# Patient Record
Sex: Female | Born: 1950
Health system: Southern US, Community
[De-identification: ages and names within clinical notes are randomized; demographics above are authoritative.]

## PROBLEM LIST (undated history)

## (undated) DIAGNOSIS — G8929 Other chronic pain: Secondary | ICD-10-CM

## (undated) DIAGNOSIS — K5909 Other constipation: Secondary | ICD-10-CM

## (undated) DIAGNOSIS — D649 Anemia, unspecified: Secondary | ICD-10-CM

## (undated) DIAGNOSIS — K279 Peptic ulcer, site unspecified, unspecified as acute or chronic, without hemorrhage or perforation: Secondary | ICD-10-CM

## (undated) DIAGNOSIS — E7800 Pure hypercholesterolemia, unspecified: Secondary | ICD-10-CM

## (undated) DIAGNOSIS — F329 Major depressive disorder, single episode, unspecified: Secondary | ICD-10-CM

## (undated) DIAGNOSIS — F32A Depression, unspecified: Secondary | ICD-10-CM

## (undated) DIAGNOSIS — Z9889 Other specified postprocedural states: Secondary | ICD-10-CM

## (undated) DIAGNOSIS — D490 Neoplasm of unspecified behavior of digestive system: Secondary | ICD-10-CM

## (undated) DIAGNOSIS — Z8719 Personal history of other diseases of the digestive system: Secondary | ICD-10-CM

## (undated) DIAGNOSIS — Z9289 Personal history of other medical treatment: Secondary | ICD-10-CM

## (undated) DIAGNOSIS — T39011A Poisoning by aspirin, accidental (unintentional), initial encounter: Secondary | ICD-10-CM

## (undated) DIAGNOSIS — K219 Gastro-esophageal reflux disease without esophagitis: Secondary | ICD-10-CM

## (undated) DIAGNOSIS — M545 Low back pain, unspecified: Secondary | ICD-10-CM

## (undated) DIAGNOSIS — G4710 Hypersomnia, unspecified: Secondary | ICD-10-CM

## (undated) DIAGNOSIS — F419 Anxiety disorder, unspecified: Secondary | ICD-10-CM

## (undated) DIAGNOSIS — R112 Nausea with vomiting, unspecified: Secondary | ICD-10-CM

## (undated) DIAGNOSIS — R270 Ataxia, unspecified: Secondary | ICD-10-CM

## (undated) DIAGNOSIS — G500 Trigeminal neuralgia: Secondary | ICD-10-CM

## (undated) DIAGNOSIS — K589 Irritable bowel syndrome without diarrhea: Secondary | ICD-10-CM

## (undated) DIAGNOSIS — E039 Hypothyroidism, unspecified: Secondary | ICD-10-CM

## (undated) DIAGNOSIS — M1990 Unspecified osteoarthritis, unspecified site: Secondary | ICD-10-CM

## (undated) DIAGNOSIS — G43909 Migraine, unspecified, not intractable, without status migrainosus: Secondary | ICD-10-CM

## (undated) HISTORY — DX: Irritable bowel syndrome, unspecified: K58.9

## (undated) HISTORY — PX: ANKLE FRACTURE SURGERY: SHX122

## (undated) HISTORY — DX: Peptic ulcer, site unspecified, unspecified as acute or chronic, without hemorrhage or perforation: K27.9

## (undated) HISTORY — DX: Ataxia, unspecified: R27.0

## (undated) HISTORY — DX: Gastro-esophageal reflux disease without esophagitis: K21.9

## (undated) HISTORY — PX: PAROTID GLAND TUMOR EXCISION: SHX5221

## (undated) HISTORY — DX: Neoplasm of unspecified behavior of digestive system: D49.0

## (undated) HISTORY — PX: HERNIA REPAIR: SHX51

## (undated) HISTORY — PX: BACK SURGERY: SHX140

## (undated) HISTORY — DX: Hypersomnia, unspecified: G47.10

## (undated) HISTORY — DX: Hypothyroidism, unspecified: E03.9

## (undated) HISTORY — PX: BRAIN SURGERY: SHX531

## (undated) HISTORY — PX: TRIGEMINAL NERVE DECOMPRESSION: SHX2579

## (undated) HISTORY — DX: Migraine, unspecified, not intractable, without status migrainosus: G43.909

## (undated) HISTORY — PX: ABDOMINAL HYSTERECTOMY: SHX81

## (undated) HISTORY — DX: Anemia, unspecified: D64.9

## (undated) HISTORY — DX: Other constipation: K59.09

## (undated) HISTORY — PX: FRACTURE SURGERY: SHX138

## (undated) HISTORY — DX: Pure hypercholesterolemia, unspecified: E78.00

## (undated) HISTORY — PX: CHOLECYSTECTOMY OPEN: SUR202

## (undated) HISTORY — DX: Major depressive disorder, single episode, unspecified: F32.9

## (undated) HISTORY — DX: Anxiety disorder, unspecified: F41.9

## (undated) HISTORY — DX: Depression, unspecified: F32.A

---

## 1978-08-01 HISTORY — PX: LAMINECTOMY AND MICRODISCECTOMY LUMBAR SPINE: SHX1913

## 1979-08-02 HISTORY — PX: KNEE CARTILAGE SURGERY: SHX688

## 1999-01-24 ENCOUNTER — Emergency Department (HOSPITAL_COMMUNITY): Admission: EM | Admit: 1999-01-24 | Discharge: 1999-01-24 | Payer: Self-pay | Admitting: Emergency Medicine

## 2000-03-24 ENCOUNTER — Encounter: Admission: RE | Admit: 2000-03-24 | Discharge: 2000-03-24 | Payer: Self-pay | Admitting: Internal Medicine

## 2000-03-24 ENCOUNTER — Encounter: Payer: Self-pay | Admitting: Internal Medicine

## 2000-04-28 ENCOUNTER — Ambulatory Visit (HOSPITAL_COMMUNITY): Admission: RE | Admit: 2000-04-28 | Discharge: 2000-04-28 | Payer: Self-pay | Admitting: Internal Medicine

## 2000-04-28 ENCOUNTER — Encounter: Payer: Self-pay | Admitting: Internal Medicine

## 2000-06-13 ENCOUNTER — Encounter: Admission: RE | Admit: 2000-06-13 | Discharge: 2000-06-13 | Payer: Self-pay | Admitting: Internal Medicine

## 2000-06-13 ENCOUNTER — Encounter: Payer: Self-pay | Admitting: Internal Medicine

## 2001-05-01 ENCOUNTER — Encounter: Payer: Self-pay | Admitting: Internal Medicine

## 2001-05-01 ENCOUNTER — Encounter: Admission: RE | Admit: 2001-05-01 | Discharge: 2001-05-01 | Payer: Self-pay | Admitting: Internal Medicine

## 2001-05-01 ENCOUNTER — Encounter (INDEPENDENT_AMBULATORY_CARE_PROVIDER_SITE_OTHER): Payer: Self-pay | Admitting: *Deleted

## 2001-06-20 ENCOUNTER — Encounter: Payer: Self-pay | Admitting: Internal Medicine

## 2001-06-20 ENCOUNTER — Encounter: Admission: RE | Admit: 2001-06-20 | Discharge: 2001-06-20 | Payer: Self-pay | Admitting: Internal Medicine

## 2002-02-15 ENCOUNTER — Encounter: Payer: Self-pay | Admitting: Otolaryngology

## 2002-02-15 ENCOUNTER — Ambulatory Visit (HOSPITAL_COMMUNITY): Admission: RE | Admit: 2002-02-15 | Discharge: 2002-02-15 | Payer: Self-pay | Admitting: Otolaryngology

## 2002-09-25 ENCOUNTER — Encounter: Admission: RE | Admit: 2002-09-25 | Discharge: 2002-09-25 | Payer: Self-pay | Admitting: Internal Medicine

## 2002-09-25 ENCOUNTER — Encounter: Payer: Self-pay | Admitting: Internal Medicine

## 2003-09-08 ENCOUNTER — Inpatient Hospital Stay (HOSPITAL_COMMUNITY): Admission: AD | Admit: 2003-09-08 | Discharge: 2003-09-12 | Payer: Self-pay | Admitting: Neurology

## 2004-04-28 ENCOUNTER — Ambulatory Visit (HOSPITAL_COMMUNITY): Admission: RE | Admit: 2004-04-28 | Discharge: 2004-04-28 | Payer: Self-pay | Admitting: Neurology

## 2004-05-21 ENCOUNTER — Encounter: Admission: RE | Admit: 2004-05-21 | Discharge: 2004-05-21 | Payer: Self-pay | Admitting: Internal Medicine

## 2004-07-20 ENCOUNTER — Ambulatory Visit: Payer: Self-pay | Admitting: Licensed Clinical Social Worker

## 2004-08-04 ENCOUNTER — Ambulatory Visit: Payer: Self-pay | Admitting: Licensed Clinical Social Worker

## 2004-08-12 ENCOUNTER — Ambulatory Visit: Payer: Self-pay | Admitting: Licensed Clinical Social Worker

## 2004-08-26 ENCOUNTER — Ambulatory Visit: Payer: Self-pay | Admitting: Licensed Clinical Social Worker

## 2004-09-02 ENCOUNTER — Ambulatory Visit: Payer: Self-pay | Admitting: Licensed Clinical Social Worker

## 2004-11-01 ENCOUNTER — Ambulatory Visit: Payer: Self-pay | Admitting: Licensed Clinical Social Worker

## 2004-11-25 ENCOUNTER — Ambulatory Visit: Payer: Self-pay | Admitting: Licensed Clinical Social Worker

## 2004-12-17 ENCOUNTER — Encounter: Admission: RE | Admit: 2004-12-17 | Discharge: 2004-12-17 | Payer: Self-pay | Admitting: Neurology

## 2005-01-06 ENCOUNTER — Ambulatory Visit: Payer: Self-pay | Admitting: Licensed Clinical Social Worker

## 2005-02-21 ENCOUNTER — Ambulatory Visit: Payer: Self-pay | Admitting: Licensed Clinical Social Worker

## 2005-02-24 ENCOUNTER — Ambulatory Visit: Payer: Self-pay | Admitting: Licensed Clinical Social Worker

## 2005-03-09 ENCOUNTER — Ambulatory Visit: Payer: Self-pay | Admitting: Licensed Clinical Social Worker

## 2005-03-16 ENCOUNTER — Ambulatory Visit: Payer: Self-pay | Admitting: Licensed Clinical Social Worker

## 2005-04-01 ENCOUNTER — Ambulatory Visit: Payer: Self-pay | Admitting: Licensed Clinical Social Worker

## 2005-04-15 ENCOUNTER — Ambulatory Visit: Payer: Self-pay | Admitting: Licensed Clinical Social Worker

## 2005-04-21 ENCOUNTER — Encounter: Admission: RE | Admit: 2005-04-21 | Discharge: 2005-04-21 | Payer: Self-pay | Admitting: Internal Medicine

## 2005-05-04 ENCOUNTER — Ambulatory Visit: Payer: Self-pay | Admitting: Licensed Clinical Social Worker

## 2005-05-19 ENCOUNTER — Ambulatory Visit: Payer: Self-pay | Admitting: Licensed Clinical Social Worker

## 2005-06-02 ENCOUNTER — Ambulatory Visit: Payer: Self-pay | Admitting: Licensed Clinical Social Worker

## 2005-08-12 ENCOUNTER — Ambulatory Visit: Payer: Self-pay | Admitting: Licensed Clinical Social Worker

## 2005-08-25 ENCOUNTER — Ambulatory Visit: Payer: Self-pay | Admitting: Licensed Clinical Social Worker

## 2005-08-30 ENCOUNTER — Encounter (INDEPENDENT_AMBULATORY_CARE_PROVIDER_SITE_OTHER): Payer: Self-pay | Admitting: *Deleted

## 2005-08-30 ENCOUNTER — Encounter: Admission: RE | Admit: 2005-08-30 | Discharge: 2005-08-30 | Payer: Self-pay | Admitting: Internal Medicine

## 2005-11-18 ENCOUNTER — Encounter: Admission: RE | Admit: 2005-11-18 | Discharge: 2005-11-18 | Payer: Self-pay | Admitting: Neurology

## 2005-12-07 ENCOUNTER — Emergency Department (HOSPITAL_COMMUNITY): Admission: EM | Admit: 2005-12-07 | Discharge: 2005-12-07 | Payer: Self-pay | Admitting: Emergency Medicine

## 2006-01-31 ENCOUNTER — Ambulatory Visit: Payer: Self-pay | Admitting: Licensed Clinical Social Worker

## 2006-03-22 ENCOUNTER — Ambulatory Visit: Payer: Self-pay | Admitting: Licensed Clinical Social Worker

## 2006-05-11 ENCOUNTER — Encounter: Admission: RE | Admit: 2006-05-11 | Discharge: 2006-05-11 | Payer: Self-pay | Admitting: Internal Medicine

## 2007-02-28 ENCOUNTER — Ambulatory Visit: Payer: Self-pay | Admitting: Gastroenterology

## 2007-03-14 ENCOUNTER — Ambulatory Visit: Payer: Self-pay | Admitting: Gastroenterology

## 2007-04-20 ENCOUNTER — Ambulatory Visit: Payer: Self-pay | Admitting: Gastroenterology

## 2007-04-24 ENCOUNTER — Encounter: Payer: Self-pay | Admitting: Gastroenterology

## 2007-04-24 ENCOUNTER — Ambulatory Visit: Payer: Self-pay | Admitting: Gastroenterology

## 2007-05-23 ENCOUNTER — Ambulatory Visit: Payer: Self-pay | Admitting: Gastroenterology

## 2007-09-26 ENCOUNTER — Emergency Department (HOSPITAL_COMMUNITY): Admission: EM | Admit: 2007-09-26 | Discharge: 2007-09-27 | Payer: Self-pay | Admitting: Emergency Medicine

## 2007-10-03 DIAGNOSIS — Z8719 Personal history of other diseases of the digestive system: Secondary | ICD-10-CM

## 2007-10-03 DIAGNOSIS — K5909 Other constipation: Secondary | ICD-10-CM

## 2007-10-03 DIAGNOSIS — E039 Hypothyroidism, unspecified: Secondary | ICD-10-CM | POA: Insufficient documentation

## 2007-10-03 DIAGNOSIS — K589 Irritable bowel syndrome without diarrhea: Secondary | ICD-10-CM | POA: Insufficient documentation

## 2007-10-18 ENCOUNTER — Encounter: Payer: Self-pay | Admitting: Gastroenterology

## 2007-12-28 ENCOUNTER — Ambulatory Visit: Payer: Self-pay | Admitting: Licensed Clinical Social Worker

## 2008-01-21 ENCOUNTER — Ambulatory Visit: Payer: Self-pay | Admitting: Licensed Clinical Social Worker

## 2008-02-08 ENCOUNTER — Telehealth: Payer: Self-pay | Admitting: Gastroenterology

## 2008-02-29 ENCOUNTER — Encounter: Admission: RE | Admit: 2008-02-29 | Discharge: 2008-02-29 | Payer: Self-pay | Admitting: Internal Medicine

## 2008-04-01 ENCOUNTER — Ambulatory Visit: Payer: Self-pay | Admitting: Licensed Clinical Social Worker

## 2008-04-08 ENCOUNTER — Ambulatory Visit: Payer: Self-pay | Admitting: Licensed Clinical Social Worker

## 2008-04-16 ENCOUNTER — Encounter: Payer: Self-pay | Admitting: Gastroenterology

## 2008-05-26 ENCOUNTER — Ambulatory Visit: Payer: Self-pay | Admitting: Licensed Clinical Social Worker

## 2008-10-13 ENCOUNTER — Ambulatory Visit: Payer: Self-pay | Admitting: Gastroenterology

## 2008-10-13 DIAGNOSIS — R1013 Epigastric pain: Secondary | ICD-10-CM

## 2008-10-17 ENCOUNTER — Ambulatory Visit: Payer: Self-pay | Admitting: Gastroenterology

## 2008-10-17 ENCOUNTER — Encounter: Payer: Self-pay | Admitting: Gastroenterology

## 2008-10-21 ENCOUNTER — Encounter: Payer: Self-pay | Admitting: Gastroenterology

## 2008-11-11 ENCOUNTER — Ambulatory Visit: Payer: Self-pay | Admitting: Nurse Practitioner

## 2008-11-13 ENCOUNTER — Telehealth: Payer: Self-pay | Admitting: Gastroenterology

## 2008-11-24 ENCOUNTER — Telehealth: Payer: Self-pay | Admitting: Gastroenterology

## 2008-11-26 ENCOUNTER — Ambulatory Visit: Payer: Self-pay | Admitting: Internal Medicine

## 2008-11-26 DIAGNOSIS — K2990 Gastroduodenitis, unspecified, without bleeding: Secondary | ICD-10-CM

## 2008-11-26 DIAGNOSIS — K2970 Gastritis, unspecified, without bleeding: Secondary | ICD-10-CM | POA: Insufficient documentation

## 2008-11-26 DIAGNOSIS — K269 Duodenal ulcer, unspecified as acute or chronic, without hemorrhage or perforation: Secondary | ICD-10-CM | POA: Insufficient documentation

## 2008-11-26 DIAGNOSIS — Z8711 Personal history of peptic ulcer disease: Secondary | ICD-10-CM

## 2008-11-26 LAB — CONVERTED CEMR LAB
Basophils Relative: 0.6 % (ref 0.0–3.0)
Eosinophils Relative: 3.2 % (ref 0.0–5.0)
HCT: 34.4 % — ABNORMAL LOW (ref 36.0–46.0)
Hemoglobin: 11.8 g/dL — ABNORMAL LOW (ref 12.0–15.0)
MCHC: 34.3 g/dL (ref 30.0–36.0)
MCV: 86.6 fL (ref 78.0–100.0)
Monocytes Absolute: 0.5 10*3/uL (ref 0.1–1.0)
Neutro Abs: 2.6 10*3/uL (ref 1.4–7.7)
Neutrophils Relative %: 48.1 % (ref 43.0–77.0)
RBC: 3.97 M/uL (ref 3.87–5.11)
WBC: 5.5 10*3/uL (ref 4.5–10.5)

## 2008-12-01 ENCOUNTER — Ambulatory Visit: Payer: Self-pay | Admitting: Licensed Clinical Social Worker

## 2008-12-11 ENCOUNTER — Inpatient Hospital Stay (HOSPITAL_COMMUNITY): Admission: AD | Admit: 2008-12-11 | Discharge: 2008-12-14 | Payer: Self-pay | Admitting: Neurology

## 2008-12-24 ENCOUNTER — Ambulatory Visit: Payer: Self-pay | Admitting: Licensed Clinical Social Worker

## 2009-01-13 ENCOUNTER — Telehealth: Payer: Self-pay | Admitting: Gastroenterology

## 2009-01-15 ENCOUNTER — Encounter: Payer: Self-pay | Admitting: Gastroenterology

## 2009-01-15 ENCOUNTER — Ambulatory Visit: Payer: Self-pay | Admitting: Licensed Clinical Social Worker

## 2009-01-15 ENCOUNTER — Telehealth (INDEPENDENT_AMBULATORY_CARE_PROVIDER_SITE_OTHER): Payer: Self-pay | Admitting: *Deleted

## 2009-08-04 ENCOUNTER — Ambulatory Visit: Payer: Self-pay | Admitting: Licensed Clinical Social Worker

## 2009-08-21 ENCOUNTER — Ambulatory Visit: Payer: Self-pay | Admitting: Licensed Clinical Social Worker

## 2009-08-24 ENCOUNTER — Encounter: Admission: RE | Admit: 2009-08-24 | Discharge: 2009-08-24 | Payer: Self-pay | Admitting: Internal Medicine

## 2009-08-28 ENCOUNTER — Ambulatory Visit: Payer: Self-pay | Admitting: Licensed Clinical Social Worker

## 2009-09-01 ENCOUNTER — Telehealth: Payer: Self-pay | Admitting: Gastroenterology

## 2009-09-11 ENCOUNTER — Telehealth: Payer: Self-pay | Admitting: Gastroenterology

## 2009-09-14 ENCOUNTER — Ambulatory Visit: Payer: Self-pay | Admitting: Internal Medicine

## 2009-09-17 ENCOUNTER — Telehealth: Payer: Self-pay | Admitting: Nurse Practitioner

## 2009-09-24 ENCOUNTER — Ambulatory Visit: Payer: Self-pay | Admitting: Gastroenterology

## 2009-09-25 ENCOUNTER — Telehealth: Payer: Self-pay | Admitting: Gastroenterology

## 2009-09-28 ENCOUNTER — Telehealth: Payer: Self-pay | Admitting: Gastroenterology

## 2009-10-01 ENCOUNTER — Telehealth: Payer: Self-pay | Admitting: Gastroenterology

## 2009-10-09 ENCOUNTER — Ambulatory Visit: Payer: Self-pay | Admitting: Licensed Clinical Social Worker

## 2009-10-13 ENCOUNTER — Ambulatory Visit: Payer: Self-pay | Admitting: Gastroenterology

## 2009-10-13 LAB — CONVERTED CEMR LAB
ALT: 44 units/L — ABNORMAL HIGH (ref 0–35)
Albumin: 3.8 g/dL (ref 3.5–5.2)
BUN: 15 mg/dL (ref 6–23)
Basophils Absolute: 0 10*3/uL (ref 0.0–0.1)
Basophils Relative: 0.5 % (ref 0.0–3.0)
Calcium: 9 mg/dL (ref 8.4–10.5)
Creatinine, Ser: 0.8 mg/dL (ref 0.4–1.2)
Eosinophils Absolute: 0.1 10*3/uL (ref 0.0–0.7)
GFR calc non Af Amer: 77.64 mL/min (ref >60)
MCHC: 32.8 g/dL (ref 30.0–36.0)
MCV: 91.4 fL (ref 78.0–100.0)
Monocytes Absolute: 0.7 10*3/uL (ref 0.1–1.0)
Neutro Abs: 3.1 10*3/uL (ref 1.4–7.7)
Neutrophils Relative %: 45.7 % (ref 43.0–77.0)
RBC: 4.05 M/uL (ref 3.87–5.11)
RDW: 13.4 % (ref 11.5–14.6)
TSH: 1.16 microintl units/mL (ref 0.35–5.50)
Total Protein: 6.5 g/dL (ref 6.0–8.3)

## 2009-10-19 ENCOUNTER — Telehealth: Payer: Self-pay | Admitting: Gastroenterology

## 2009-10-19 ENCOUNTER — Encounter (INDEPENDENT_AMBULATORY_CARE_PROVIDER_SITE_OTHER): Payer: Self-pay | Admitting: *Deleted

## 2009-10-22 ENCOUNTER — Encounter: Payer: Self-pay | Admitting: Gastroenterology

## 2009-10-22 ENCOUNTER — Ambulatory Visit: Payer: Self-pay | Admitting: Cardiology

## 2009-10-30 ENCOUNTER — Telehealth: Payer: Self-pay | Admitting: Gastroenterology

## 2009-11-25 ENCOUNTER — Ambulatory Visit: Payer: Self-pay | Admitting: Critical Care Medicine

## 2009-11-25 ENCOUNTER — Inpatient Hospital Stay (HOSPITAL_COMMUNITY): Admission: EM | Admit: 2009-11-25 | Discharge: 2009-12-01 | Payer: Self-pay | Admitting: Emergency Medicine

## 2009-12-01 ENCOUNTER — Inpatient Hospital Stay (HOSPITAL_COMMUNITY): Admission: AD | Admit: 2009-12-01 | Discharge: 2009-12-04 | Payer: Self-pay | Admitting: Psychiatry

## 2009-12-01 ENCOUNTER — Ambulatory Visit: Payer: Self-pay | Admitting: Psychiatry

## 2009-12-07 ENCOUNTER — Ambulatory Visit: Payer: Self-pay | Admitting: Licensed Clinical Social Worker

## 2009-12-11 ENCOUNTER — Ambulatory Visit: Payer: Self-pay | Admitting: Licensed Clinical Social Worker

## 2009-12-24 ENCOUNTER — Encounter (INDEPENDENT_AMBULATORY_CARE_PROVIDER_SITE_OTHER): Payer: Self-pay | Admitting: *Deleted

## 2009-12-24 ENCOUNTER — Encounter: Admission: RE | Admit: 2009-12-24 | Discharge: 2009-12-24 | Payer: Self-pay | Admitting: Otolaryngology

## 2009-12-25 ENCOUNTER — Ambulatory Visit: Payer: Self-pay | Admitting: Licensed Clinical Social Worker

## 2010-01-15 ENCOUNTER — Ambulatory Visit: Payer: Self-pay | Admitting: Licensed Clinical Social Worker

## 2010-01-22 ENCOUNTER — Telehealth: Payer: Self-pay | Admitting: Gastroenterology

## 2010-01-27 ENCOUNTER — Telehealth: Payer: Self-pay | Admitting: Gastroenterology

## 2010-01-28 ENCOUNTER — Telehealth (INDEPENDENT_AMBULATORY_CARE_PROVIDER_SITE_OTHER): Payer: Self-pay | Admitting: *Deleted

## 2010-02-02 ENCOUNTER — Encounter: Payer: Self-pay | Admitting: Gastroenterology

## 2010-02-02 ENCOUNTER — Ambulatory Visit: Payer: Self-pay | Admitting: Gastroenterology

## 2010-02-02 DIAGNOSIS — R197 Diarrhea, unspecified: Secondary | ICD-10-CM | POA: Insufficient documentation

## 2010-02-02 DIAGNOSIS — R110 Nausea: Secondary | ICD-10-CM | POA: Insufficient documentation

## 2010-02-02 DIAGNOSIS — R198 Other specified symptoms and signs involving the digestive system and abdomen: Secondary | ICD-10-CM | POA: Insufficient documentation

## 2010-02-04 ENCOUNTER — Ambulatory Visit: Payer: Self-pay | Admitting: Gastroenterology

## 2010-02-04 ENCOUNTER — Telehealth: Payer: Self-pay | Admitting: Gastroenterology

## 2010-02-04 ENCOUNTER — Encounter (INDEPENDENT_AMBULATORY_CARE_PROVIDER_SITE_OTHER): Payer: Self-pay | Admitting: *Deleted

## 2010-02-05 ENCOUNTER — Ambulatory Visit: Payer: Self-pay | Admitting: Licensed Clinical Social Worker

## 2010-02-16 ENCOUNTER — Ambulatory Visit (HOSPITAL_COMMUNITY): Admission: RE | Admit: 2010-02-16 | Discharge: 2010-02-16 | Payer: Self-pay | Admitting: Gastroenterology

## 2010-02-19 ENCOUNTER — Telehealth: Payer: Self-pay | Admitting: Gastroenterology

## 2010-02-22 ENCOUNTER — Telehealth: Payer: Self-pay | Admitting: Gastroenterology

## 2010-03-09 ENCOUNTER — Ambulatory Visit: Payer: Self-pay | Admitting: Gastroenterology

## 2010-03-19 ENCOUNTER — Ambulatory Visit: Payer: Self-pay | Admitting: Licensed Clinical Social Worker

## 2010-03-24 ENCOUNTER — Ambulatory Visit: Payer: Self-pay | Admitting: Vascular Surgery

## 2010-03-25 ENCOUNTER — Encounter: Payer: Self-pay | Admitting: Gastroenterology

## 2010-03-31 ENCOUNTER — Telehealth: Payer: Self-pay | Admitting: Gastroenterology

## 2010-04-01 HISTORY — PX: ANTERIOR LUMBAR FUSION: SHX1170

## 2010-04-08 ENCOUNTER — Ambulatory Visit: Payer: Self-pay | Admitting: Vascular Surgery

## 2010-04-08 ENCOUNTER — Inpatient Hospital Stay (HOSPITAL_COMMUNITY): Admission: RE | Admit: 2010-04-08 | Discharge: 2010-04-13 | Payer: Self-pay | Admitting: Orthopedic Surgery

## 2010-04-09 ENCOUNTER — Encounter (INDEPENDENT_AMBULATORY_CARE_PROVIDER_SITE_OTHER): Payer: Self-pay | Admitting: Orthopedic Surgery

## 2010-06-18 ENCOUNTER — Ambulatory Visit: Payer: Self-pay | Admitting: Licensed Clinical Social Worker

## 2010-07-02 ENCOUNTER — Ambulatory Visit: Payer: Self-pay | Admitting: Licensed Clinical Social Worker

## 2010-07-22 ENCOUNTER — Ambulatory Visit: Payer: Self-pay | Admitting: Licensed Clinical Social Worker

## 2010-08-21 ENCOUNTER — Encounter: Payer: Self-pay | Admitting: Internal Medicine

## 2010-09-02 NOTE — Letter (Signed)
Summary: Appt Reminder 2  [facility]  [address] [place], [place] [address]   Phone: [phone]  Fax: [phone]        October 19, 2009 MRN: [id number]    [facility] [address] [place], [place]  [address]    Dear Ms. Prim,   You have a return appointment with Dr.Robert Arlyce Dice on 11-16-09 at 9:30am.  Please remember to bring a complete list of the medicines you are taking, your insurance card and your co-pay.  If you have to cancel or reschedule this appointment, please call before 5:00 pm the evening before to avoid a cancellation fee.  If you have any questions or concerns, please call [phone].    Sincerely,    Laureen Ochs LPN  Appended Document: Appt Reminder 2 Letter mailed to patient.

## 2010-09-02 NOTE — Progress Notes (Signed)
Summary: GES and REV Scheduled   Phone Note Outgoing Call   Call placed by: Laureen Ochs LPN,  February 04, 453 3:44 PM Call placed to: Patient Summary of Call: Pt. is scheduled for a GES at [facility] on 02-16-10 at 9am (NPO after 12mn) and an REV with Dr.Shawonda Kerce on 03-09-10 at 2:30pm. Pt. is in [facility] recovery post Endoscopy, the appt. information wil be given to her driver, I will contact patient tomorrow to confirm appt's with her.  Initial call taken by: Laureen Ochs LPN,  February 05, 980 3:46 PM  Follow-up for Phone Call        Pt. agrees with above appt's. Pt. instructed to call back as needed.  Follow-up by: Laureen Ochs LPN,  February 05, 1913 10:06 AM

## 2010-09-02 NOTE — Progress Notes (Signed)
Summary: Gastric Emptying Scan results   Phone Note Call from Patient Call back at Home Phone [phone]   Caller: Patient Call For: Dr. Arlyce Dice Reason for Call: Lab or Test Results Summary of Call: Calling for her Gastric Emptying Scan results Initial call taken by: Karna Christmas,  February 19, 2010 2:00 PM  Follow-up for Phone Call        Given results.Will talk more with Dr.Kaplan at [facility] on 03/09/10 as she still has nausea.Is usung her Zofran. Follow-up by: Teryl Lucy RN,  February 19, 2010 2:17 PM

## 2010-09-02 NOTE — Progress Notes (Signed)
Summary: rx not in pharmacy  Medications Added DEXILANT 60 MG CPDR (DEXLANSOPRAZOLE) 1 by mouth once daily       Phone Note Call from Patient Call back at Home Phone [phone]   Caller: Patient Call For: Arlyce Dice Reason for Call: Talk to Nurse Summary of Call: Patient states that pharmacy does not have her rx on Dexilant please resend it/ Initial call taken by: Tawni Levy,  September 01, 2009 1:58 PM  Follow-up for Phone Call        called pt to inform    New/Updated Medications: DEXILANT 60 MG CPDR (DEXLANSOPRAZOLE) 1 by mouth once daily Prescriptions: DEXILANT 60 MG CPDR (DEXLANSOPRAZOLE) 1 by mouth once daily  #30 x 6   Entered by:   Merri Ray CMA (AAMA)   Authorized by:   Louis Meckel MD   Signed by:   Merri Ray CMA (AAMA) on 09/01/2009   Method used:   [facility]       [address]       [place], [place]  [address]       Ph: [phone]       Fax: [phone]   RxID:   [id number] DEXILANT 60 MG CPDR (DEXLANSOPRAZOLE) 1 by mouth once daily  #30 x 6   Entered by:   Merri Ray CMA (AAMA)   Authorized by:   Louis Meckel MD   Signed by:   Merri Ray CMA (AAMA) on 09/01/2009   Method used:   [id number]       [address]       [place], [place]  [address]       Ph: [phone] or [phone]       Fax: [phone]   RxID:   [id number]  [facility] [address]

## 2010-09-02 NOTE — Medication Information (Signed)
Summary: Nexium Approved/[employer]  Nexium Approved/[employer]   Imported By: Sherian Rein 04/06/2010 11:16:54  _____________________________________________________________________  External Attachment:    Type:   Image     Comment:   External Document

## 2010-09-02 NOTE — Assessment & Plan Note (Signed)
Summary: PAIN,NAUSEA, DIARRHEA (F/U FROM TRIAGE 01-27-10)     (DR.KAPLA...    History of Present Illness Visit Type: Follow-up Visit Primary GI MD: Melvia Heaps MD Primary Provider: Elmore Guise, MD Chief Complaint: Constant Nasuea and change in bowels x 3-4 weeks. Pt states her bowels are snake shaped and loose and "fluffy". Pt states she always feels like she is going to get sick before, during and after she eats. She has some intermittant abd pain below sternum. History of Present Illness:   Caitlin Irwin is followed by Dr. Arlyce Dice for history of chronic abdominal pain, PUD and constipation. Patient here with a three to four day history of constant nausea. Many of her routine medications were stopped in April when patient  hospitalizedl after being found unresponive on floor. Sounds like there was an inadvertant mismanagment of her migraine and psychiatric medications.    Caitlin Irwin has a long history of constipation for which she has been tried on several things. Since hospital discharge she has been having small, soft, "fluffy" stools almost after each meal. No longer needs Miralax.   Has DJD and is to have surgery soon. While hosptialized a parotid gland tumor was seen on MRI. She is for surgery at [facility].     GI Review of Systems    Reports abdominal pain, loss of appetite, and  nausea.     Location of  Abdominal pain: upper abdomen.    Denies acid reflux, belching, bloating, chest pain, dysphagia with liquids, dysphagia with solids, heartburn, vomiting, vomiting blood, weight loss, and  weight gain.      Reports change in bowel habits.     Denies anal fissure, black tarry stools, constipation, diarrhea, diverticulosis, fecal incontinence, heme positive stool, hemorrhoids, irritable bowel syndrome, jaundice, light color stool, liver problems, rectal bleeding, and  rectal pain.    Current Medications (verified): 1)  Hyomax-Ft 0.125 Mg Tbdp (Hyoscyamine Sulfate) .... 2 Tablets By Mouth Sl  Every 4 Hours As Needed 2)  Aldactone 100 Mg Tabs (Spironolactone) .... Once Daily 3)  Valtrex 500 Mg Tabs (Valacyclovir Hcl) .... Once Daily 4)  Premarin 0.625 Mg Tabs (Estrogens Conjugated) .... Once Daily 5)  Synthroid 75 Mcg Tabs (Levothyroxine Sodium) .... Once Daily 6)  Klonopin 0.5 Mg Tabs (Clonazepam) .... Take 1 At Bedtime and 1/2  Every Morning 7)  Treximet 85-500 Mg Tabs (Sumatriptan-Naproxen Sodium) .... Take As Needed For Migraines 8)  Boniva 150 Mg Tabs (Ibandronate Sodium) .... Take Once Monthly 9)  Methadone Hcl 5 Mg Tabs (Methadone Hcl) .... Take One By Mouth Once Daily 10)  Miralax  Powd (Polyethylene Glycol 3350) .... Take Once Daily As Needed 11)  Imitrex 100 Mg Tabs (Sumatriptan Succinate) .... As Needed For Migraines 12)  Nexium 40 Mg Cpdr (Esomeprazole Magnesium) .... One Tablet By Mouth Once Daily 13)  Relpax 40 Mg Tabs (Eletriptan Hydrobromide) .... As Needed For Migraines 14)  Zofran 4 Mg Tabs (Ondansetron Hcl) .... 1/2 Tablet By Mouth Two Times A Day As Needed  Allergies (verified): 1)  ! Tylenol 2)  ! * Amiitiza  Past History:  Past Medical History: MIGRAINES CONSTIPATION, CHRONIC (ICD-564.09) HYPOTHYROIDISM (ICD-244.9) IRRITABLE BOWEL SYNDROME (ICD-564.1) NSAID INDUCED GASTRITIS AND PYLORIC ULCER 3/10 PEPTIC ULCER DISEASE 2010 CHRONIC PAIN PAROTID TUMOR (MRI 2011)  Past Surgical History: Reviewed history from 09/14/2009 and no changes required. cholecystectomy 1999 hysterectomy 1985 right knee surgery L5 surgery heary surgery (correct hole in heart) brain surgery for Mirgrains, clipped nerves left ankle surgery  Family  History: Reviewed history from 09/14/2009 and no changes required. Family History of Ovarian Cancer:Mother No FH of Colon Cancer Family History of Heart Disease: maternal grandfather  Social History: Reviewed history from 09/24/2009 and no changes required. Occupation: seeking employment Patient has never smoked.  Alcohol  Use - no Daily Caffeine Use 1 coffee Illicit Drug Use - no Married   No Children   Review of Systems  The patient denies allergy/sinus, anemia, anxiety-new, arthritis/joint pain, back pain, blood in urine, breast changes/lumps, change in vision, confusion, cough, coughing up blood, depression-new, fainting, fatigue, fever, headaches-new, hearing problems, heart murmur, heart rhythm changes, itching, menstrual pain, muscle pains/cramps, night sweats, nosebleeds, pregnancy symptoms, shortness of breath, skin rash, sleeping problems, sore throat, swelling of feet/legs, swollen lymph glands, thirst - excessive , urination - excessive , urination changes/pain, urine leakage, vision changes, and voice change.    Vital Signs:  Patient profile:   60 year old female Height:      59 inches Weight:      113.25 pounds BMI:     22.96 Pulse rate:   60 / minute Pulse rhythm:   regular BP sitting:   104 / 64  (left arm) Cuff size:   regular  Vitals Entered By: Christie Nottingham CMA Duncan Dull) (February 02, 2010 10:23 AM)  Physical Exam  General:  Well developed, well nourished, no acute distress. Head:  Normocephalic and atraumatic. Eyes:  Conjunctiva pink, no icterus.  Mouth:  Conjunctiva pink, no icterus.  Neck:  no obvious masses  Lungs:  Clear throughout to auscultation. Heart:  RRR Abdomen:  Abdomen soft, nontender, nondistended. No obvious masses or hepatomegaly.Normal bowel sounds.  Msk:  Symmetrical with no gross deformities. Normal posture. Extremities:  No palmar erythema, no edema.  Neurologic:  Alert and  oriented x4;  grossly normal neurologically. Skin:  Intact without significant lesions or rashes. Cervical Nodes:  No significant cervical adenopathy. Psych:  Alert and cooperative. Normal mood and affect.   Impression & Recommendations:  Problem # 1:  NAUSEA (ICD-787.02) Constant nausea over the last month, her chronic epigastric pain has gotten worse as well.  Her weight is  stable. Nausea could be medication related but many of her psychiatric medications were recently stopped and Methadone dose reduced.  Patient does take Treximet, which contains Naproxen, but she takes no more than 9 tablets a month. Caitlin Irwin has a history of PUD (March 2010). For evaluation of above symptoms will schedule patient for an EGD with biopsies ( if indicated).  The risks and benefits of the procedure, as well as alternatives were discussed with the patient and she agrees to proceed. Continue daily Nexium.   Orders: EGD (EGD)  Problem # 2:  ABDOMINAL PAIN-EPIGASTRIC (ICD-789.06) Assessment: Deteriorated See #1.   Problem # 3:  HYPOTHYROIDISM (ICD-244.9) Assessment: Comment Only  Problem # 4:  CHANGE IN BOWELS (ZOX-096.04) Assessment: New Chronic constipation, now with small, "fluffy" stools after each meal. Patient is up to date on colon cancer screening. She has recently had several medication adjustments so for now will just monitor. Agree with holding Miralax for now but should resume for constipation  Patient Instructions: 1)  We have scheduled the Endoscopy with Dr. Arlyce Dice on 02-04-10. 2)  Directions and brochure given. 3)  [facility] Patient Information Guide given to patient. 4)  Copy sent to : Elmore Guise, MD 5)  The medication list was reviewed and reconciled.  All changed / newly prescribed medications were explained.  A  complete medication list was provided to the patient / caregiver.  Appended Document: PAIN,NAUSEA, DIARRHEA (F/U FROM TRIAGE 01-27-10)     (DR.KAPLA... ?? trial of accupuncture here.Marland KitchenMarland Kitchen???

## 2010-09-02 NOTE — Progress Notes (Signed)
Summary: Condition Update  Medications Added CYTOTEC 200 MCG TABS (MISOPROSTOL) Take one by mouth 4 times daily.       Phone Note Call from Patient Call back at Home Phone [phone]   Caller: Patient Call For: Arlyce Dice Reason for Call: Talk to Nurse Summary of Call: Patient states that meds given to her to try is not working , it's making her very ill (Amitiza)  Initial call taken by: Tawni Levy,  September 28, 2009 10:21 AM  Follow-up for Phone Call        Began Amitizia after OV 09-24-09. She is taking it with food/water. She has severe nausea, chest pain, headache & diarrhea within 30 minutes after taking it. Pt. is also very insistent she has a bowel blockage.   [facility] PLEASE ADVISE  Follow-up by: Laureen Ochs LPN,  September 28, 2009 10:50 AM  Additional Follow-up for Phone Call Additional follow up Details #1::        d/c amitiza. give miralax 125oz in 32cc gatorade and drink over 2-3 hours.  once clear try cytotec 200 micrograms qid ov 2 weeks after starting new med Additional Follow-up by: Louis Meckel MD,  September 28, 2009 4:51 PM    Additional Follow-up for Phone Call Additional follow up Details #2::    Above MD orders reviewed with patient. Pt. to keep scheduled office visit 10-13-09 at 10:15am. Pt. instructed to call back as needed.   Follow-up by: Laureen Ochs LPN,  September 29, 2009 9:27 AM  New/Updated Medications: CYTOTEC 200 MCG TABS (MISOPROSTOL) Take one by mouth 4 times daily. Prescriptions: CYTOTEC 200 MCG TABS (MISOPROSTOL) Take one by mouth 4 times daily.  #120 x 1   Entered by:   Laureen Ochs LPN   Authorized by:   Louis Meckel MD   Signed by:   Laureen Ochs LPN on 84/13/2440   Method used:   [facility]  [address] [phone][facility]ail[facility]       [address]       [place], [place]  [address]       Ph: [phone] or [phone]       Fax: [phone]   RxID:    [id number]

## 2010-09-02 NOTE — Letter (Signed)
Summary: EGD Instructions  [facility]  [address] [place], [place] [address]   Phone: [phone]  Fax: [phone]       [name]    06-30-1951    MRN: [id number]       Procedure Day /Date:02-04-10     Arrival Time:  1:30 PM      Procedure Time: 2:30 PM     Location of Procedure:                    X    [facility] (4th Floor) PREPARATION FOR ENDOSCOPY   On 02-04-10 THE DAY OF THE PROCEDURE:  1.   No solid foods, milk or milk products are allowed after midnight the night before your procedure.  2.   Do not drink anything colored red or purple.  Avoid juices with pulp.  No orange juice.  3.  You may drink clear liquids until 12:30 PM , which is 2 hours before your procedure.                                                                                                CLEAR LIQUIDS INCLUDE: Water Jello Ice Popsicles Tea (sugar ok, no milk/cream) Powdered fruit flavored drinks Coffee (sugar ok, no milk/cream) Gatorade Juice: apple, white grape, white cranberry  Lemonade Clear bullion, consomm, broth Carbonated beverages (any kind) Strained chicken noodle soup Hard Candy   MEDICATION INSTRUCTIONS  Unless otherwise instructed, you should take regular prescription medications with a small sip of water as early as possible the morning of your procedure.        OTHER INSTRUCTIONS  You will need a responsible adult at least 62 years of age to accompany you and drive you home.   This person must remain in the waiting room during your procedure.  Wear loose fitting clothing that is easily removed.  Leave jewelry and other valuables at home.  However, you may wish to bring a book to read or an iPod/MP3 player to listen to music as you wait for your procedure to start.  Remove all body piercing jewelry and leave at home.  Total time from sign-in until discharge is approximately 2-3 hours.  You should go home directly after your  procedure and rest.  You can resume normal activities the day after your procedure.  The day of your procedure you should not:   Drive   Make legal decisions   Operate machinery   Drink alcohol   Return to work  You will receive specific instructions about eating, activities and medications before you leave.    The above instructions have been reviewed and explained to me by   _______________________    I fully understand and can verbalize these instructions _____________________________ Date _________

## 2010-09-02 NOTE — Progress Notes (Signed)
Summary: pain meds   Phone Note Call from Patient Call back at Home Phone [phone]   Caller: Patient Call For: Dr. Arlyce Dice Reason for Call: Talk to Nurse Summary of Call: reporting severe pain from stomach ulcers and would like a stronger medication for pain Initial call taken by: Vallarie Mare,  January 22, 2010 11:08 AM  Follow-up for Phone Call        PCP increased Dexilant to two times a day but it is not helping.Given rov appt with PA for 01/26/10 and advised she may use Mylanta or Maalox as needed. Follow-up by: Teryl Lucy RN,  January 22, 2010 11:27 AM

## 2010-09-02 NOTE — Letter (Signed)
Summary: Appt Reminder 2  [facility]  [address] [place], [place] [address]   Phone: [phone]  Fax: [phone]        February 04, 2010 MRN: [id number]    [facility] [address] [place], [place]  [address]    Dear Ms. Muilenburg,   You have a return appointment with Dr.Robert Arlyce Dice on 03-09-10 at 2:30pm.  Please remember to bring a complete list of the medicines you are taking, your insurance card and your co-pay.  If you have to cancel or reschedule this appointment, please call before 5:00 pm the evening before to avoid a cancellation fee.  If you have any questions or concerns, please call [phone].    Sincerely,    Laureen Ochs LPN  Appended Document: Appt Reminder 2 Given to pt. in [facility] recovery.

## 2010-09-02 NOTE — Progress Notes (Signed)
Summary: Triage  Medications Added NEXIUM 40 MG CPDR (ESOMEPRAZOLE MAGNESIUM) 1 by mouth two times a day       Phone Note Call from Patient Call back at Home Phone [phone]   Caller: Patient Call For: Dr. Arlyce Dice Reason for Call: Talk to Nurse Complaint: Urinary/GYN Problems Summary of Call: Questions about her Gastric Emptying results and her meds. Initial call taken by: Karna Christmas,  February 22, 2010 8:37 AM  Follow-up for Phone Call        Pt has follow up appt Aug 9.  Pt is going to run out of Hyomax before OV.  Pt asks if we can send refill and give her more than 60 tabs.  She takes 2 tabs q 4 hrs.  Also asks about increasing nexium to two times a day.  She does not feel that once daily is helping.  She still has upper abd pain. Pt aware Dr. Arlyce Dice will not be here today. Follow-up by: Ashok Cordia RN,  February 22, 2010 9:00 AM  Additional Follow-up for Phone Call Additional follow up Details #1::        ok to r/n meds and i ncreasenexium Additional Follow-up by: Louis Meckel MD,  February 22, 2010 12:03 PM    Additional Follow-up for Phone Call Additional follow up Details #2::    Pt notified.  Rx sent as requested. Follow-up by: Ashok Cordia RN,  February 22, 2010 3:16 PM  New/Updated Medications: NEXIUM 40 MG CPDR (ESOMEPRAZOLE MAGNESIUM) 1 by mouth two times a day Prescriptions: HYOMAX-FT 0.125 MG TBDP (HYOSCYAMINE SULFATE) 2 tablets by mouth SL every 4 hours as needed  #180 x 3   Entered by:   Ashok Cordia RN   Authorized by:   Louis Meckel MD   Signed by:   Ashok Cordia RN on 02/22/2010   Method used:   [facility]  [address] [phone][facility] (retail)       [address]       [place], [place]  [id number]       Ph: [phone] or [phone]       Fax: [phone]   RxID:   [id number] NEXIUM 40 MG CPDR (ESOMEPRAZOLE MAGNESIUM) 1 by mouth two times a day  #60 x 6   Entered by:   Ashok Cordia RN  Authorized by:   Louis Meckel MD   Signed by:   Ashok Cordia RN on 02/22/2010   Method used:   [facility]        [facility]  [address] [phone]* (retail)       [address]       [place], [place]  [address]       Ph: [phone] or [phone]       Fax: [phone]   RxID:   [id number] HYOMAX-FT 0.125 MG TBDP (HYOSCYAMINE SULFATE) 2 tablets by mouth SL every 4 hours as needed  #180 x 3   Entered by:   Ashok Cordia RN   Authorized by:   Louis Meckel MD   Signed by:   Ashok Cordia RN on 02/22/2010   Method used:   Print then Give to Patient   RxID:   [id number]

## 2010-09-02 NOTE — Assessment & Plan Note (Signed)
Summary: F/U ENDOSCOPY AND GES            [place]aitlin    History of Present Illness Visit Type: Follow-up Visit Primary GI MD: Melvia Heaps MD Primary Provider: Elmore Guise, MD Requesting Provider: na Chief Complaint: F/u from EGD, and GES. Pt c/o belching with food coming back up and nausea  History of Present Illness:   [name] has returned follow up with her nausea and regurgitation.  Gastric emptying scan demonstrated slight delay in gastric emptying.  Halfway through a meal she will develop  early satiety, nausea and may regurgitate.  She remains on low dose methadone.   GI Review of Systems    Reports belching and  nausea.      Denies abdominal pain, acid reflux, bloating, chest pain, dysphagia with liquids, dysphagia with solids, heartburn, loss of appetite, vomiting, vomiting blood, weight loss, and  weight gain.        Denies anal fissure, black tarry stools, change in bowel habit, constipation, diarrhea, diverticulosis, fecal incontinence, heme positive stool, hemorrhoids, irritable bowel syndrome, jaundice, light color stool, liver problems, rectal bleeding, and  rectal pain.    Current Medications (verified): 1)  Hyomax-Sl 0.125 Mg Subl (Hyoscyamine Sulfate) .... Two Tablets By Mouth Sl As Needed Every Four Hours 2)  Aldactone 100 Mg Tabs (Spironolactone) .... Once Daily 3)  Valtrex 500 Mg Tabs (Valacyclovir Hcl) .... Once Daily 4)  Premarin 0.625 Mg Tabs (Estrogens Conjugated) .... Once Daily 5)  Synthroid 75 Mcg Tabs (Levothyroxine Sodium) .... Once Daily 6)  Klonopin 1 Mg Tabs (Clonazepam) .... Two Tablets By Mouth At Bedtime and One Tablet By Mouth in The Morning 7)  Treximet 85-500 Mg Tabs (Sumatriptan-Naproxen Sodium) .... Take As Needed For Migraines 8)  Boniva 150 Mg Tabs (Ibandronate Sodium) .... Take Once Monthly 9)  Methadone Hcl 5 Mg Tabs (Methadone Hcl) .... Take One By Mouth Once Daily 10)  Miralax  Powd (Polyethylene Glycol 3350) .... Take Once Daily As  Needed 11)  Imitrex 100 Mg Tabs (Sumatriptan Succinate) .... As Needed For Migraines 12)  Nexium 40 Mg Cpdr (Esomeprazole Magnesium) .Marland Kitchen.. 1 By Mouth Two Times A Day 13)  Relpax 40 Mg Tabs (Eletriptan Hydrobromide) .... As Needed For Migraines 14)  Zofran 4 Mg Tabs (Ondansetron Hcl) .... 1/2-1  Tablet By Mouth Every Four Hours As Needed 15)  Zofran Sublingual .... As Needed For Migrains  Allergies (verified): 1)  ! Tylenol 2)  ! * Amiitiza  Past History:  Past Medical History: Reviewed history from 02/02/2010 and no changes required. MIGRAINES CONSTIPATION, CHRONIC (ICD-564.09) HYPOTHYROIDISM (ICD-244.9) IRRITABLE BOWEL SYNDROME (ICD-564.1) NSAID INDUCED GASTRITIS AND PYLORIC ULCER 3/10 PEPTIC ULCER DISEASE 2010 CHRONIC PAIN PAROTID TUMOR (MRI 2011)  Past Surgical History: Reviewed history from 09/14/2009 and no changes required. cholecystectomy 1999 hysterectomy 1985 right knee surgery L5 surgery heary surgery (correct hole in heart) brain surgery for Mirgrains, clipped nerves left ankle surgery  Family History: Reviewed history from 09/14/2009 and no changes required. Family History of Ovarian Cancer:Mother No FH of Colon Cancer Family History of Heart Disease: maternal grandfather  Social History: Reviewed history from 09/24/2009 and no changes required. Occupation: seeking employment Patient has never smoked.  Alcohol Use - no Daily Caffeine Use 1 coffee Illicit Drug Use - no Married   No Children   Review of Systems       The patient complains of allergy/sinus, anxiety-new, back pain, cough, fatigue, hearing problems, night sweats, and sleeping problems.  The patient  denies anemia, arthritis/joint pain, blood in urine, breast changes/lumps, change in vision, confusion, coughing up blood, depression-new, fainting, fever, headaches-new, heart murmur, heart rhythm changes, itching, menstrual pain, muscle pains/cramps, nosebleeds, pregnancy symptoms, shortness of  breath, skin rash, sore throat, swelling of feet/legs, swollen lymph glands, thirst - excessive , urination - excessive , urination changes/pain, urine leakage, vision changes, and voice change.    Vital Signs:  Patient profile:   60 year old female Height:      59 inches Weight:      112 pounds BMI:     22.70 BSA:     1.44 Pulse rate:   72 / minute Pulse rhythm:   regular BP sitting:   120 / 64  (left arm) Cuff size:   regular  Vitals Entered By: Ok Anis CMA (March 09, 2010 9:29 AM)   Impression & Recommendations:  Problem # 1:  NAUSEA (ICD-787.02) She may have mild gastroparesis caused by her low-dose methadone.  Recommendations #1 trial of Reglan 10 mg b.i.d. to q.i.d.  Patient Instructions: 1)  Copy sent to : Elmore Guise, MD 2)  We are sending in a prescription of Reglan to your pharmacy 3)  Call back as needed  4)  The medication list was reviewed and reconciled.  All changed / newly prescribed medications were explained.  A complete medication list was provided to the patient / caregiver. Prescriptions: REGLAN 10 MG TABS (METOCLOPRAMIDE HCL) 1 by mouth 3 times a day 30 minutes before each meal and 1 at bedtime  #120 x 0   Entered by:   Merri Ray CMA (AAMA)   Authorized by:   Louis Meckel MD   Signed by:   Merri Ray CMA (AAMA) on 03/09/2010   Method used:   [facility]       [facility]       [address]       [place], [place]  [address]       Ph: [phone]       Fax: [phone]   RxID:   [id number]

## 2010-09-02 NOTE — Progress Notes (Signed)
Summary: Triage   Phone Note Call from Patient Call back at Home Phone [phone]   Caller: Patient Call For: Arlyce Dice Reason for Call: Talk to Nurse Summary of Call: Patient wants to discuss medication given to her esterday with nurse Initial call taken by: Tawni Levy,  September 25, 2009 3:39 PM  Follow-up for Phone Call        Pt. was given Amitizia 24 mcg samples at OV yesterday.  Wants to know if she is to continue Miralax two times a day. Per OV note she is to continue daily fiber, Miralax and take the Amitizia two times a day with food/water. Pt. instructed to call back as needed.  Follow-up by: Laureen Ochs LPN,  September 25, 2009 3:52 PM

## 2010-09-02 NOTE — Progress Notes (Signed)
Summary: TRIAGE-CT & REV SCHEDULED   Phone Note Call from Patient Call back at Home Phone [phone]   Caller: Patient Call For: Dr. Arlyce Dice Reason for Call: Talk to Nurse Summary of Call: Pt. is having alot of pain and is nauseated with her meds. Having problems with her BM. Initial call taken by: Karna Christmas,  October 19, 2009 11:15 AM  Follow-up for Phone Call        Last OV 10-13-09, she did begin Lactulose 2 TBSP. QAM & QPM, she reports no change in bowel habits. States she doesn't have diarrhea, but her stools are thin strips.  Pt. is crying and states she is so worried that something might be very wrong, wants someone to do an x-ray to "Look inside me"   DR.Goran Olden PLEASE ADVISE  Follow-up by: Laureen Ochs LPN,  October 19, 2009 11:49 AM  Additional Follow-up for Phone Call Additional follow up Details #1::        Increase to 3tablespoons two times a day if she can tolerate it. Get CT of abd/pelvis then OV Additional Follow-up by: Louis Meckel MD,  October 19, 2009 2:01 PM    Additional Follow-up for Phone Call Additional follow up Details #2::    Above MD orders reviewed with patient. CT is scheduled at [facility] CT on 10-22-09 at 1pm and OV with Dr.Luka Reisch on 11-16-09 at 9:15am. Pt. will pick-up CT contrast/instructions  tomorrow. Pt. instructed to call back as needed.  Follow-up by: Laureen Ochs LPN,  October 19, 2009 3:04 PM

## 2010-09-02 NOTE — Progress Notes (Signed)
Summary: update   Phone Note Call from Patient Call back at Home Phone [phone]   Caller: Patient Call For: Dr. Arlyce Dice Reason for Call: Talk to Nurse Summary of Call: pt calling to give a report on how she's doing after taking prep... also wants to know what medication she is supposed to take now and why Initial call taken by: Vallarie Mare,  October 01, 2009 2:10 PM  Follow-up for Phone Call         Pt. has used the prep's as ordered and has been using the Miralax once a day to acheive a bm. Asking why the Cytotec is being prescribed. Also asking if she should continue her Dexilant. Follow-up by: Teryl Lucy RN,  October 01, 2009 3:47 PM  Additional Follow-up for Phone Call Additional follow up Details #1::        Continue dexilant. Cytotec is to help with her constipation. needs f/u OV Additional Follow-up by: Louis Meckel MD,  October 02, 2009 10:17 AM    Additional Follow-up for Phone Call Additional follow up Details #2::     Pt. ntfd. of Dr.Kaplan's orders and has rov appt. already scheduled for 10/13/2009. Follow-up by: Teryl Lucy RN,  October 02, 2009 10:23 AM

## 2010-09-02 NOTE — Progress Notes (Signed)
  Phone Note Other Incoming   Request: Send information Summary of Call: Request for records received from [facility]. Request forwarded to [facility].     

## 2010-09-02 NOTE — Progress Notes (Signed)
Summary: Triage   Phone Note Call from Patient Call back at Home Phone [phone]   Caller: Patient Call For: Willette Cluster Reason for Call: Talk to Nurse Summary of Call: Pt. has taken her Golytely and its been 2 days since she finished and she is having abdominal pain and bloating. No BM Initial call taken by: Karna Christmas,  September 17, 2009 9:56 AM  Follow-up for Phone Call        The pt did finish the Golytly prep on Mon evening.  On Tues AM and afternoon she did  have brown watery BM's 3-4 times.  She had sharp abd pains last evening but not during the night and none today.  Yesterday she ate light and had scrambles eggs and toast in the AM and Beef Barley Soup for dinner.  She said she really doesn't eat much at one time at all.Today she has not had any pains, she has not had any BM but she is able to pass gas. Today Thurs she did have Grapenuts and applesauce for breakfast. Follow-up by: Joselyn Glassman,  September 17, 2009 10:50 AM  Additional Follow-up for Phone Call Additional follow up Details #1::        Per Gunnar Fusi, the pt  can use Miralax 1-2 times daily and Dulcolax Suppositories every other day.  She has a follow up appt schedueld with Dr. Arlyce Dice for 09-30-09 at 9:45Am.   I urged her to call us before that date if she has any further problems or questions. Additional Follow-up by: Joselyn Glassman,  September 17, 2009 2:22 PM

## 2010-09-02 NOTE — Progress Notes (Signed)
Summary: TRIAGE  Phone Note Call from Patient Call back at Home Phone [phone]   Caller: Patient Call For: Dr. Arlyce Dice Summary of Call: Pt states she is having extreme abdominal pain.  Thinks it could be from her ulcers.  Pt has been using Maalox without relief.  Pt was unable to keep her appt with Amy yesterday.  Pt does not want to wait until next available appt with Dr. Arlyce Dice. Initial call taken by: Francee Piccolo CMA Duncan Dull),  January 27, 2010 2:50 PM  Follow-up for Phone Call        Pt. cancelled appt. w/Dr.Darris Staiger on 11-16-09 to go to the beach, no showed 12-15-09, "I was in the hospital with a seizure" and she cancelled an appt. yesterday with Amy. Pt. calling today demanding to be seen ASAP. Pt. c/o constant periumbilical/epigastric pain for 1 month. Also bloating, increased belching & nausea. Diarrhea: 4-5 loose stools daily for 2 weeks.  Denies blood, black stool, fever.  Takes Dexilant two times a day, doesn't use Hyomax.   1) See Willette Cluster NP 02-02-10 at 10am-You MUST keep this appt.  2) Continue Dexilant two times a day 3) Refill for Hyomax sent to pharmacy, use as directed. 4) Gas-x,Phazyme, etc. as needed for gas & bloating. 5) Immodium as needed for diarrhea. 6) Soft,bland diet. No spicy,greasy,fried foods. Use Boost/Ensure to supplement. 7) If symptoms become worse call back immediately or go to ER.  Follow-up by: Laureen Ochs LPN,  January 27, 2010 3:33 PM  Additional Follow-up for Phone Call Additional follow up Details #1::        ok Additional Follow-up by: Louis Meckel MD,  January 27, 2010 4:51 PM    Prescriptions: HYOMAX-FT 0.125 MG TBDP (HYOSCYAMINE SULFATE) 2 tablets by mouth SL every 4 hours as needed  #60 x 3   Entered by:   Laureen Ochs LPN   Authorized by:   Louis Meckel MD   Signed by:   Laureen Ochs LPN on 09/81/1914   Method used:   [facility]       [facility]       [address]  [place], [place]  [address]       Ph: [phone]       Fax: [phone]   RxID:   [id number]

## 2010-09-02 NOTE — Assessment & Plan Note (Signed)
Summary: F/U FROM TRIAGE ON 09-28-09            [facility]    History of Present Illness Visit Type: Follow-up Visit Primary GI MD: Melvia Heaps MD Primary Provider: Elmore Guise, MD Chief Complaint: Abdominal pain & constipation; patient has not had a normal  bowel movement x 75month History of Present Illness:   Caitlin Irwin has returned for reevaluation of her constipation.  She did not tolerate amitiza.  With Cytotec she would have small amounts of loose watery stools but still has a sense of incomplete evacuation.  She complains of fatigue and mild weight gain.  She has been slowly weaning from methadone.   GI Review of Systems    Reports abdominal pain, bloating, and  nausea.     Location of  Abdominal pain: epigastric area.    Denies acid reflux, belching, chest pain, dysphagia with liquids, dysphagia with solids, heartburn, loss of appetite, vomiting, vomiting blood, weight loss, and  weight gain.      Reports constipation and  light color stool.     Denies anal fissure, black tarry stools, change in bowel habit, diarrhea, diverticulosis, fecal incontinence, heme positive stool, hemorrhoids, irritable bowel syndrome, jaundice, liver problems, rectal bleeding, and  rectal pain.    Current Medications (verified): 1)  Hyomax-Ft 0.125 Mg Tbdp (Hyoscyamine Sulfate) .... 2 Tablets By Mouth Sl Every 4 Hours As Needed 2)  Aldactone 100 Mg Tabs (Spironolactone) .... Once Daily 3)  Valtrex 500 Mg Tabs (Valacyclovir Hcl) .... Once Daily 4)  Premarin 0.625 Mg Tabs (Estrogens Conjugated) .... Once Daily 5)  Synthroid 75 Mcg Tabs (Levothyroxine Sodium) .... Once Daily 6)  Wellbutrin Sr 100 Mg Xr12h-Tab (Bupropion Hcl) .... Once Daily 7)  Pristiq 50 Mg Xr24h-Tab (Desvenlafaxine Succinate) .... Once Daily 8)  Klonopin 0.5 Mg Tabs (Clonazepam) .... Take 1 At Bedtime and 1/2  Every Morning 9)  Trazodone Hcl 50 Mg Tabs (Trazodone Hcl) .... Once Daily 10)  Treximet 85-500 Mg Tabs (Sumatriptan-Naproxen  Sodium) .... Take As Needed For Migraines 11)  Boniva 150 Mg Tabs (Ibandronate Sodium) .... Take Once Monthly 12)  Carafate 1 Gm/54ml Susp (Sucralfate) .... Take 1 Gram Between Meals and At Bedtime 13)  Dexilant 60 Mg Cpdr (Dexlansoprazole) .Marland Kitchen.. 1 By Mouth Once Daily 14)  Methadone Hcl 5 Mg Tabs (Methadone Hcl) .... Take One By Mouth Two Times A Day 15)  Gabapentin 300 Mg Caps (Gabapentin) .... Take One By Mouth At Bedtime 16)  Cytotec 200 Mcg Tabs (Misoprostol) .... Take One By Mouth 4 Times Daily. 17)  Miralax  Powd (Polyethylene Glycol 3350) .... Take Once Daily As Needed 18)  Imitrex 100 Mg Tabs (Sumatriptan Succinate) .... As Needed For Migraines  Allergies: 1)  ! Tylenol 2)  ! * Amiitiza  Past History:  Past Medical History: Reviewed history from 11/26/2008 and no changes required. Current Problems:DAILY MIGRAINES  CONSTIPATION, CHRONIC (ICD-564.09) HYPOTHYROIDISM (ICD-244.9) IRRITABLE BOWEL SYNDROME (ICD-564.1) NSAID INDUCED GASTRITIS AND PYLORIC ULCER 3/10  Past Surgical History: Reviewed history from 09/14/2009 and no changes required. cholecystectomy 1999 hysterectomy 1985 right knee surgery L5 surgery heary surgery (correct hole in heart) brain surgery for Mirgrains, clipped nerves left ankle surgery  Family History: Reviewed history from 09/14/2009 and no changes required. Family History of Ovarian Cancer:Mother No FH of Colon Cancer Family History of Heart Disease: maternal grandfather  Social History: Reviewed history from 09/24/2009 and no changes required. Occupation: seeking employment Patient has never smoked.  Alcohol Use - no Daily  Caffeine Use 1 coffee Illicit Drug Use - no Married   No Children   Review of Systems       The patient complains of anxiety-new, back pain, cough, depression-new, fatigue, headaches-new, night sweats, sleeping problems, and thirst - excessive.    Vital Signs:  Patient profile:   60 year old female Height:       59 inches Weight:      114 pounds BMI:     23.11 Pulse rate:   60 / minute Pulse rhythm:   regular BP sitting:   98 / 60  (left arm) Cuff size:   regular  Vitals Entered By: June McMurray CMA Duncan Dull) (October 13, 2009 11:09 AM)  Physical Exam  Additional Exam:  On physical exam she is a well-developed well-nourished female  Abdomen is without masses tenderness or organomegaly   Impression & Recommendations:  Problem # 1:  CONSTIPATION, CHRONIC (ICD-564.09) Therapy with Cytotec, amitiza and MiraLax have been unsuccessful.  Weight gain, constipation and fatigue raise the  question of hypothyroidism.  Chronic narcotic use is certainly contributing.  Recommendations #1 trial of lactulose 30 cc to 4 times a day #2 check CBC, completemetabolic profile and thyroid function tests  Other Orders: TLB-CBC Platelet - w/Differential (85025-CBCD) TLB-CMP (Comprehensive Metabolic Pnl) (80053-COMP) TLB-TSH (Thyroid Stimulating Hormone) ([address]-UEA)  Patient Instructions: 1)  You will go to the basement for labs today 2)  Please schedule a follow up appointment for 3 weeks 3)  Hold carafate 4)  The medication list was reviewed and reconciled.  All changed / newly prescribed medications were explained.  A complete medication list was provided to the patient / caregiver. Prescriptions: LACTULOSE 10 GM/15ML SOLN (LACTULOSE) take 2 tablespoons 2-4 times a day  #500 x 3   Entered by:   Merri Ray CMA (AAMA)   Authorized by:   Louis Meckel MD   Signed by:   Merri Ray CMA (AAMA) on 10/13/2009   Method used:   [facility]       [facility]       [address]       [place], [place]  [address]       Ph: [phone]       Fax: [phone]   RxID:   [id number] LACTULOSE 10 GM/15ML SOLN (LACTULOSE) take 2 tablespoons 2-4 times a day  #500 x 3   Entered and Authorized by:   Louis Meckel MD   Signed by:   Louis Meckel MD on 10/13/2009   Method used:   Historical   RxID:   [id number]

## 2010-09-02 NOTE — Progress Notes (Signed)
Summary: TRIAGE-Abd Pain   Phone Note Call from Patient Call back at Home Phone [phone]   Call For: Dr Arlyce Dice Complaint: Abdominal Pain Summary of Call: Appt 09-30-09 but doesnt feel she can wait that long. Initial call taken by: Leanor Kail [facility],  September 11, 2009 3:16 PM  Follow-up for Phone Call        Pt. c/o abd. bloating, n/v, constipation, for 2-3 weeks. Has tried OTC supp., laxative, Miralax daily, not helping. Wants to be seen ASAP.  Pt. is crying and states she is hurting so bad she cannot even pass urine. She states she she saw Dr.Ed Chilton Si 2 weeks ago and she doesn't have a UTI. Pt. continues to cry and states, "It just hurts so bad!"  1) Pt. advised to have her husband take her directly to the ER for evaluation.  2) call me on Monday with an update. ( She may be seen on 09-14-09, pending update)  Follow-up by: Laureen Ochs LPN,  September 11, 2009 3:27 PM  Additional Follow-up for Phone Call Additional follow up Details #1::        OV next week, either with me or a PA Additional Follow-up by: Louis Meckel MD,  September 11, 2009 3:56 PM    Additional Follow-up for Phone Call Additional follow up Details #2::    Pt. called back and states she feels better, she will wait to see Willette Cluster NP on 09-14-09 at 10:30am. If symptoms become worse call back immediately or go to ER.  Follow-up by: Laureen Ochs LPN,  September 11, 2009 4:26 PM

## 2010-09-02 NOTE — Progress Notes (Signed)
Summary: Nexium Approved   Phone Note From Pharmacy Call back at [facility]   Summary of Call: Pt approved for Nexium from 03/04/2010 to 03/25/2011. Will send approval letter down to be scanned Initial call taken by: Merri Ray CMA Duncan Dull),  March 31, 2010 2:10 PM

## 2010-09-02 NOTE — Letter (Signed)
Summary: Results Letter  [facility]  [address] [place], [place] [address]   Phone: [phone]  Fax: [phone]        October 22, 2009 MRN: [id number]    [facility] [address] [place], [place]  [address]    Dear Ms. Maggi,  Your CT results did not show any remarkable findings.  Please continue with the recommendations previously discussed (high dose lactulose).    Should you have any further questions or immediate concers, feel free to contact me.  Please make a followup office appointment.  Sincerely,  Barbette Hair. Arlyce Dice, M.D., [facility]          Sincerely,  Louis Meckel MD  This letter has been electronically signed by your physician.  Appended Document: Results Letter Reviewed with pt. by phone and mailed to her.

## 2010-09-02 NOTE — Progress Notes (Signed)
Summary: TRIAGE   Phone Note Call from Patient Call back at Home Phone [phone]   Caller: Patient Call For: Dr. Arlyce Dice Reason for Call: Talk to Nurse Summary of Call: not sure if she should still be taking Misoprostol Initial call taken by: Vallarie Mare,  October 30, 2009 11:00 AM  Follow-up for Phone Call        Pt. takes 3 TBSP. of Lactulose two times a day, Miralax 17gm daily. She has a small BM daily, but doesn't feel it is a normal BM. Continues to feel her abd. is hard and bloated. Also states she is eating well and gaining weight. Per OV note 10-13-09, stop Cytotec.  1) Gas-x,Phazyme, etc. three times a day for gas/bloating 2) Continue above meds 3) Pt. to keep scheduled office visit 11-16-09. 4) If symptoms become worse call back immediately. 5) I will call pt., if new orders, after MD reviews.   Follow-up by: Laureen Ochs LPN,  October 30, 2009 11:33 AM  Additional Follow-up for Phone Call Additional follow up Details #1::        take misoprostol only if needed Additional Follow-up by: Louis Meckel MD,  October 30, 2009 1:55 PM    Additional Follow-up for Phone Call Additional follow up Details #2::    Above MD orders reviewed with patient.  Follow-up by: Laureen Ochs LPN,  October 30, 2009 2:30 PM

## 2010-09-02 NOTE — Assessment & Plan Note (Signed)
Summary: abd pain/constipation/hx ulcers...as.     History of Present Illness Visit Type: Follow-up Visit Primary GI MD: Melvia Heaps MD Primary Provider: Elmore Guise, MD Chief Complaint: recurrent abdominal pain and constipation History of Present Illness:   Caitlin Irwin has returned for followup of her constipation.  Despite a high fiber diet and MiraLax twice a day she is not moving her bowels.  She had an  initial cleanout with a bowel prep but has not had a bowel movement since.  She hopes to wean  herself and discontinue methadone (for migraine headaches)  though she continues to take it at present.  Last colonoscopy in 2008 revealed melanosis.   GI Review of Systems    Reports abdominal pain.     Location of  Abdominal pain: lower abdomen.    Denies acid reflux, belching, bloating, chest pain, dysphagia with liquids, dysphagia with solids, heartburn, loss of appetite, nausea, vomiting, vomiting blood, weight loss, and  weight gain.      Reports constipation.     Denies anal fissure, black tarry stools, change in bowel habit, diarrhea, diverticulosis, fecal incontinence, heme positive stool, hemorrhoids, irritable bowel syndrome, jaundice, light color stool, liver problems, rectal bleeding, and  rectal pain.    Current Medications (verified): 1)  Hyomax-Ft 0.125 Mg Tbdp (Hyoscyamine Sulfate) .... 2 Tablets By Mouth Sl Every 4 Hours As Needed 2)  Aldactone 100 Mg Tabs (Spironolactone) .... Once Daily 3)  Valtrex 500 Mg Tabs (Valacyclovir Hcl) .... Once Daily 4)  Premarin 0.625 Mg Tabs (Estrogens Conjugated) .... Once Daily 5)  Synthroid 75 Mcg Tabs (Levothyroxine Sodium) .... Once Daily 6)  Wellbutrin 75 Mg Tabs (Bupropion Hcl) .... Once Daily 7)  Pristiq 50 Mg Xr24h-Tab (Desvenlafaxine Succinate) .... Once Daily 8)  Klonopin 0.5 Mg Tabs (Clonazepam) .... Take 1 At Bedtime and 1/2  Every Morning 9)  Trazodone Hcl 50 Mg Tabs (Trazodone Hcl) .... Once Daily 10)  Treximet 85-500 Mg  Tabs (Sumatriptan-Naproxen Sodium) .... Take As Needed For Migraines 11)  Boniva 150 Mg Tabs (Ibandronate Sodium) .... Take Once Monthly 12)  Carafate 1 Gm/37ml Susp (Sucralfate) .... Take 1 Gram Between Meals and At Bedtime 13)  Dexilant 60 Mg Cpdr (Dexlansoprazole) .Marland Kitchen.. 1 By Mouth Once Daily 14)  Methadone Hcl 5 Mg Tabs (Methadone Hcl) .... Take One By Mouth Two Times A Day 15)  Zofran 4 Mg Tabs (Ondansetron Hcl) .... Take One By Mouth Two Times A Day As Needed 16)  Gabapentin 300 Mg Caps (Gabapentin) .... Take One By Mouth At Bedtime 17)  Lunesta 2 Mg Tabs (Eszopiclone) .... Take 1/2 Tablet By Mouth At Bedtime As Needed 18)  Golytely 236 Gm Solr (Peg 3350-Kcl-Nabcb-Nacl-Nasulf) .... As Directed  Allergies (verified): 1)  ! Tylenol  Past History:  Past Medical History: Reviewed history from 11/26/2008 and no changes required. Current Problems:DAILY MIGRAINES  CONSTIPATION, CHRONIC (ICD-564.09) HYPOTHYROIDISM (ICD-244.9) IRRITABLE BOWEL SYNDROME (ICD-564.1) NSAID INDUCED GASTRITIS AND PYLORIC ULCER 3/10  Past Surgical History: Reviewed history from 09/14/2009 and no changes required. cholecystectomy 1999 hysterectomy 1985 right knee surgery L5 surgery heary surgery (correct hole in heart) brain surgery for Mirgrains, clipped nerves left ankle surgery  Family History: Reviewed history from 09/14/2009 and no changes required. Family History of Ovarian Cancer:Mother No FH of Colon Cancer Family History of Heart Disease: maternal grandfather  Social History: Reviewed history from 09/14/2009 and no changes required. Occupation: seeking employment Patient has never smoked.  Alcohol Use - no Daily Caffeine Use 1 coffee Illicit  Drug Use - no Married   No Children   Review of Systems       The patient complains of sleeping problems.  The patient denies allergy/sinus, anemia, anxiety-new, arthritis/joint pain, back pain, blood in urine, breast changes/lumps, change in  vision, confusion, cough, coughing up blood, depression-new, fainting, fatigue, fever, headaches-new, hearing problems, heart murmur, heart rhythm changes, itching, muscle pains/cramps, night sweats, nosebleeds, shortness of breath, skin rash, sore throat, swelling of feet/legs, swollen lymph glands, thirst - excessive, urination - excessive, urination changes/pain, urine leakage, vision changes, and voice change.    Vital Signs:  Patient profile:   60 year old female Height:      59 inches Weight:      114.38 pounds BMI:     23.19 Pulse rate:   88 / minute Pulse rhythm:   regular BP sitting:   126 / 66  (left arm)  Vitals Entered By: Milford Cage NCMA (September 24, 2009 8:50 AM)   Impression & Recommendations:  Problem # 1:  CONSTIPATION, CHRONIC (ICD-564.09) Assessment Unchanged This is her primary problem causing abdominal discomfort.  Undoubtedly constipation is exacerbated by her opiate use.  Recommendations #1 trial of Amitiza 24 micrograms twice a day;  She will continue high-fiber diet and MiraLax  Patient Instructions: 1)  We have given you Amitiza samples today 2)  The medication list was reviewed and reconciled.  All changed / newly prescribed medications were explained.  A complete medication list was provided to the patient / caregiver.  Appended Document: abd pain/constipation/hx ulcers...as.    Clinical Lists Changes  Medications: Added new medication of AMITIZA 24 MCG CAPS (LUBIPROSTONE) 1 by mouth two times a day

## 2010-09-02 NOTE — Procedures (Signed)
Summary: Upper Endoscopy  Patient: Caitlin Irwin Note: All result statuses are Final unless otherwise noted.  Tests: (1) Upper Endoscopy (EGD)   EGD Upper Endoscopy       DONE     [facility]     [address]     [place], [place]  [address]           ENDOSCOPY PROCEDURE REPORT           PATIENT:  Caitlin, Irwin  MR#:  [id number]     BIRTHDATE:  1951/04/13, 58 yrs. old  GENDER:  female           ENDOSCOPIST:  Barbette Hair. Arlyce Dice, MD     Referred by:           PROCEDURE DATE:  02/04/2010     PROCEDURE:  EGD, diagnostic     ASA CLASS:  Class II     INDICATIONS:  nausea           MEDICATIONS:   Fentanyl 50 mcg IV, Versed 6 mg IV, Benadryl 12.5     mg IV, glycopyrrolate (Robinal) 0.2 mg IV, 0.6cc simethancone 0.6     cc PO     TOPICAL ANESTHETIC:  Exactacain Spray           DESCRIPTION OF PROCEDURE:   After the risks benefits and     alternatives of the procedure were thoroughly explained, informed     consent was obtained.  The LB GIF-H180 [id number] endoscope was     introduced through the mouth and advanced to the third portion of     the duodenum, without limitations.  The instrument was slowly     withdrawn as the mucosa was fully examined.     <<PROCEDUREIMAGES>>           Mild gastritis was found in the antrum (see image1). Nonerosive     gastritis (erythema only), mild  Otherwise the examination was     normal.    Retroflexed views revealed no abnormalities.    The     scope was then withdrawn from the patient and the procedure     completed.           COMPLICATIONS:  None           ENDOSCOPIC IMPRESSION:     1) Mild gastritis in the antrum     2) Otherwise normal examination           Findings do not explain nausea           RECOMMENDATIONS:     1) My office will arrange for you to have a Gastric Emptying     Scan performed. This is a radiology test that gives an idea of how     well your stomach functions.     2) Call office next 2-3 days to schedule  an office appointment     for 1 month           REPEAT EXAM:  No           ______________________________     Barbette Hair. Arlyce Dice, MD           CC:  Nila Nephew, MD           n.     Rosalie Doctor:   Barbette Hair. Kaplan at 02/04/2010 03:17 PM           Rowe Clack, [phone]  Note: An exclamation mark (!) indicates a  result that was not dispersed into the flowsheet. Document Creation Date: 02/04/2010 3:18 PM _______________________________________________________________________  (1) Order result status: Final Collection or observation date-time: 02/04/2010 15:11 Requested date-time:  Receipt date-time:  Reported date-time:  Referring Physician:   Ordering Physician: Melvia Heaps [phone]) Specimen Source:  Source: Caitlin Irwin Order Number: [id number] Lab site:

## 2010-09-02 NOTE — Assessment & Plan Note (Signed)
Summary: ABD.PAIN, BLOATING,CONSTIPATION   (DR.KAPLAN PT.)    Caitlin Irwin    History of Present Illness Visit Type: Follow-up Visit Primary GI MD: Melvia Heaps MD Primary Provider: Richelle Ito Chief Complaint: Patient complains that she feels that she has a blockage. She has alot of constipation, and bloating. She has had some weight gain in the last 3 weeks about 6 lbs. She states that she has not had a BM during this time period only "little strips". She is now having some nausea and vomiting from time to time.  History of Present Illness:   Followed by Dr. Arlyce Dice for history of PUD, erosive gastritis, and constipation. Takes Dulcolax and Senekot as needed for occasional constipation but over last three weeks has had progressive constipation associated with nausea, bloating and abdominal discomfort. Started Methadone and Gabapentin late summer / early fall for headaches. Over last two-three weeks she has had minimal stool consisting of "pebbles".  Occasional rectal bleeding with excessive straining.  Recent five pound weight gain. History of TAH. She is concerned about intestinal blockage.    GI Review of Systems    Reports abdominal pain, bloating, nausea, and  weight gain.     Location of  Abdominal pain: lower abdomen.    Denies acid reflux, belching, chest pain, dysphagia with liquids, dysphagia with solids, heartburn, loss of appetite, vomiting, vomiting blood, and  weight loss.      Reports constipation and  rectal bleeding.     Denies anal fissure, black tarry stools, change in bowel habit, diarrhea, diverticulosis, fecal incontinence, heme positive stool, hemorrhoids, irritable bowel syndrome, jaundice, light color stool, liver problems, and  rectal pain.    Current Medications (verified): 1)  Hyomax-Ft 0.125 Mg Tbdp (Hyoscyamine Sulfate) .... 2 Tablets By Mouth Sl Every 4 Hours As Needed 2)  Aldactone 100 Mg Tabs (Spironolactone) .... Once Daily 3)  Valtrex 500 Mg Tabs  (Valacyclovir Hcl) .... Once Daily 4)  Premarin 0.625 Mg Tabs (Estrogens Conjugated) .... Once Daily 5)  Synthroid 75 Mcg Tabs (Levothyroxine Sodium) .... Once Daily 6)  Wellbutrin 75 Mg Tabs (Bupropion Hcl) .... Once Daily 7)  Pristiq 50 Mg Xr24h-Tab (Desvenlafaxine Succinate) .... Once Daily 8)  Klonopin 0.5 Mg Tabs (Clonazepam) .... Take 1 At Bedtime and 1/2  Every Morning 9)  Trazodone Hcl 50 Mg Tabs (Trazodone Hcl) .... Once Daily 10)  Treximet 85-500 Mg Tabs (Sumatriptan-Naproxen Sodium) .... Take As Needed For Migraines 11)  Boniva 150 Mg Tabs (Ibandronate Sodium) .... Take Once Monthly 12)  Carafate 1 Gm/70ml Susp (Sucralfate) .... Take 1 Gram Between Meals and At Bedtime 13)  Dexilant 60 Mg Cpdr (Dexlansoprazole) .Marland Kitchen.. 1 By Mouth Once Daily 14)  Methadone Hcl 5 Mg Tabs (Methadone Hcl) .... Take One By Mouth Two Times A Day 15)  Zofran 4 Mg Tabs (Ondansetron Hcl) .... Take One By Mouth Two Times A Day As Needed 16)  Gabapentin 300 Mg Caps (Gabapentin) .... Take One By Mouth At Bedtime 17)  Lunesta 2 Mg Tabs (Eszopiclone) .... Take 1/2 Tablet By Mouth At Bedtime As Needed  Allergies (verified): 1)  ! Tylenol  Past History:  Past Medical History: Reviewed history from 11/26/2008 and no changes required. Current Problems:DAILY MIGRAINES  CONSTIPATION, CHRONIC (ICD-564.09) HYPOTHYROIDISM (ICD-244.9) IRRITABLE BOWEL SYNDROME (ICD-564.1) NSAID INDUCED GASTRITIS AND PYLORIC ULCER 3/10  Past Surgical History: cholecystectomy 1999 hysterectomy 1985 right knee surgery L5 surgery heary surgery (correct hole in heart) brain surgery for Mirgrains, clipped nerves left ankle surgery  Family History:  Family History of Ovarian Cancer:Mother No FH of Colon Cancer Family History of Heart Disease: maternal grandfather  Social History: Occupation: seeking employment Patient has never smoked.  Alcohol Use - no Daily Caffeine Use 1 coffee Illicit Drug Use - no  Review of  Systems       The patient complains of anxiety-new, arthritis/joint pain, back pain, fatigue, headaches-new, muscle pains/cramps, shortness of breath, sleeping problems, thirst - excessive, and urination - excessive.  The patient denies allergy/sinus, anemia, blood in urine, breast changes/lumps, change in vision, confusion, cough, coughing up blood, depression-new, fainting, fever, hearing problems, heart murmur, heart rhythm changes, itching, menstrual pain, night sweats, nosebleeds, pregnancy symptoms, skin rash, sore throat, swelling of feet/legs, swollen lymph glands, urination changes/pain, urine leakage, vision changes, and voice change.    Vital Signs:  Patient profile:   60 year old female Height:      59 inches Weight:      111.8 pounds BMI:     22.66 Pulse rate:   80 / minute Pulse rhythm:   regular BP sitting:   102 / 64  (right arm) Cuff size:   regular  Vitals Entered By: Harlow Mares CMA Duncan Dull) (September 14, 2009 10:47 AM)  Physical Exam  General:  Well developed, well nourished, no acute distress. Head:  Normocephalic and atraumatic. Eyes:  Conjunctiva pink, no icterus.  Neck:  no obvious masses  Lungs:  Clear throughout to auscultation. Heart:  Regular rate and rhythm; no murmurs, rubs,  or bruits. Abdomen:  Abdomen soft, nontender, nondistended. No obvious masses or hepatomegaly.Normal bowel sounds.  Rectal:  No external lesions. On digital exam there is a small amount of soft stool in vault. No appreciable internal masses but tender posteriorally.   Msk:  Symmetrical with no gross deformities. Normal posture. Extremities:  No palmar erythema, no edema.  Neurologic:  Alert and  oriented x4;  grossly normal neurologically. Skin:  Intact without significant lesions or rashes. Cervical Nodes:  No significant cervical adenopathy. Psych:  Alert and cooperative. Normal mood and affect.   Impression & Recommendations:  Problem # 1:  CONSTIPATION, CHRONIC  (ICD-564.09) Assessment Deteriorated Acute exacerbation of her chronic, intermittent constipation. Colonoscopy Aug. 2008 showed Melanosis. She started Gabapentin and Methadone in the fall and these could be contributing. Plan is to give colonoscopy prep to purge her bowels. Following that, patient will take Miralax 1-2 times a day (titrate for effect). Patient will stop bowel prep and call our office If she has any vomiting or increased abdominal pain. Otherwise, she will follow up with Dr. Arlyce Dice at which time date of next colonoscopy can be decided. She had only a "fair" prep at time of her last colonoscopy.    Patient Instructions: 1)  We sent a perscription for the Golytely preperation to [facility] [address] Follow directions . 2)  Two days later start taking capful ( 17 grams) in a glass of water or clear juice 1-2 times daily.  Adjust as needed. 3)  Willette Cluster RNP suggests you make an appiontment with your Primary Care Physician regarding the dizziness. 4)  Copy sent to : Tori Milks, MD 5)  The medication list was reviewed and reconciled.  All changed / newly prescribed medications were explained.  A complete medication list was provided to the patient / caregiver. Prescriptions: GOLYTELY 236 GM SOLR (PEG 3350-KCL-NABCB-NACL-NASULF) as directed  #1 x 0   Entered by:   Lowry Ram NCMA   Authorized by:   Willette Cluster  NP   Signed by:   Lowry Ram NCMA on 09/14/2009   Method used:   [facility] to        [phone]ail[facility]       [address]       [place], [place]  [id number]       Ph: [phone] or [phone]       Fax: [phone]   RxID:   [id number]

## 2010-09-10 ENCOUNTER — Ambulatory Visit (INDEPENDENT_AMBULATORY_CARE_PROVIDER_SITE_OTHER): Payer: BC Managed Care – PPO | Admitting: Licensed Clinical Social Worker

## 2010-09-10 DIAGNOSIS — F331 Major depressive disorder, recurrent, moderate: Secondary | ICD-10-CM

## 2010-09-16 IMAGING — CT CT NECK W/ CM
3 of 5 series · 13 of 27 positions shown, 14 images · IV contrast (agent unspecified)
Comparison: Brain MRI 11/29/2009.  09/11/2003.

CLINICAL DATA: 58-year-old female with right parapharyngeal space
mass seen on MRI.

CT NECK WITH CONTRAST
TECHNIQUE: Multidetector CT imaging of the neck was performed with
intravenous contrast.
Contrast: 75 ml Jmnipaque-CII.

[Series 2: axial neck · axial · 0.43mm/px · z∈[-30,+105]mm · 4 of 90 slices shown, 5 images]
[im 18/90  soft-tissue]
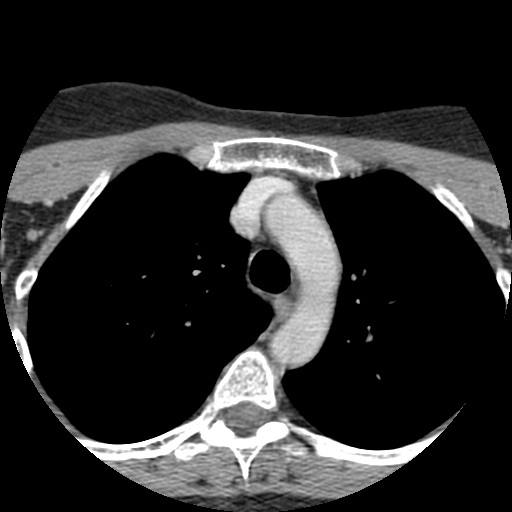
[im 18/90  bone]
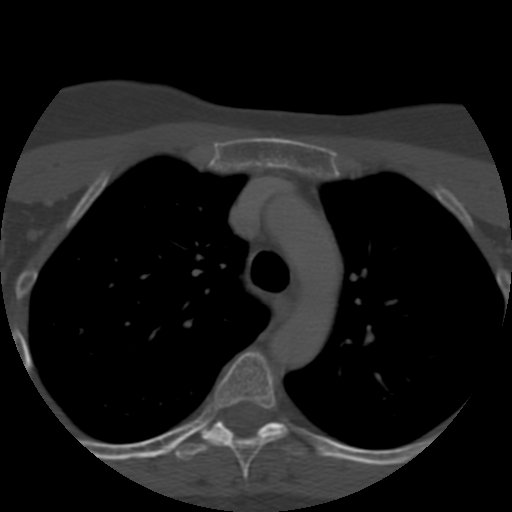
[im 36/90  bone]
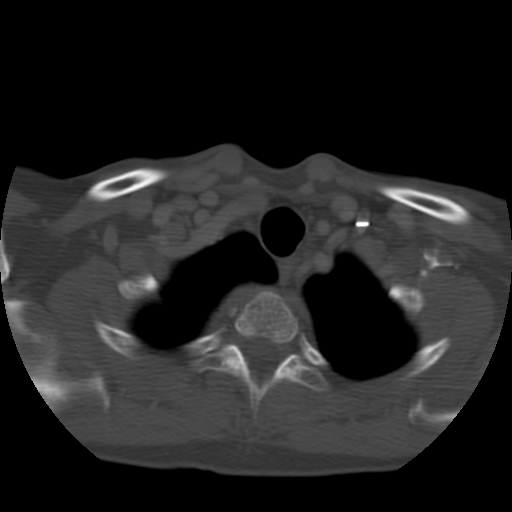
[im 54/90  bone]
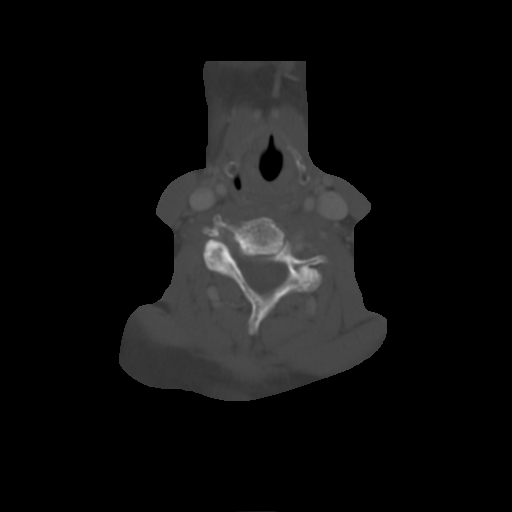
[im 72/90  bone]
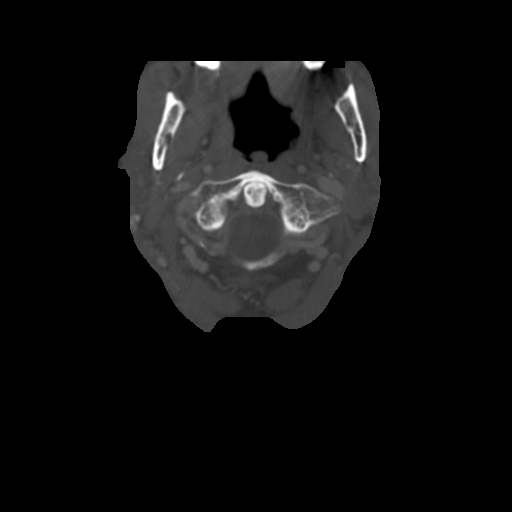

[Series 400: coronals · coronal · 0.45mm/px · 5 of 68 slices shown]
[im 12/68  bone]
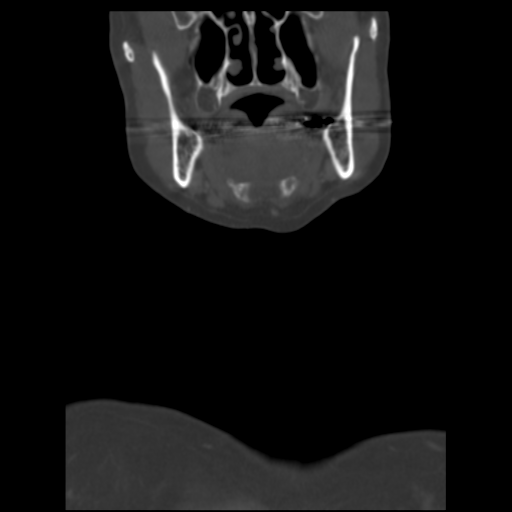
[im 23/68  bone]
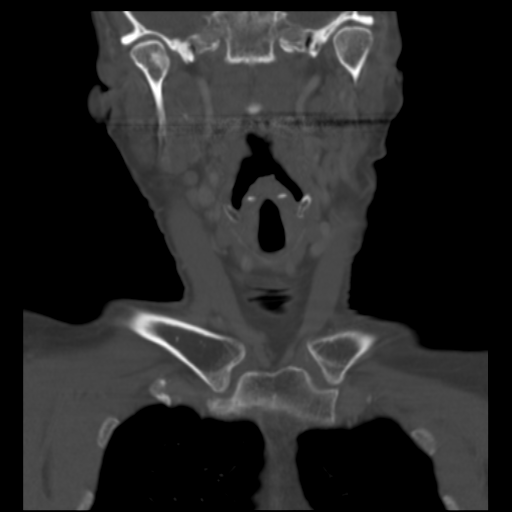
[im 34/68  bone]
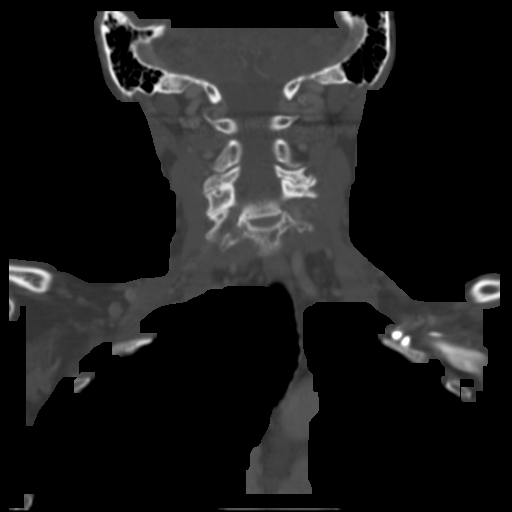
[im 45/68  bone]
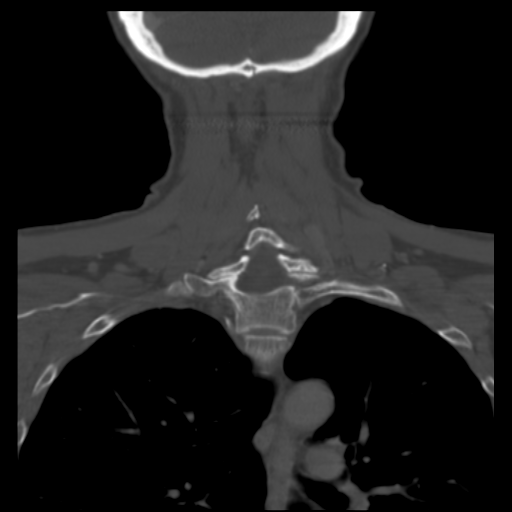
[im 56/68  bone]
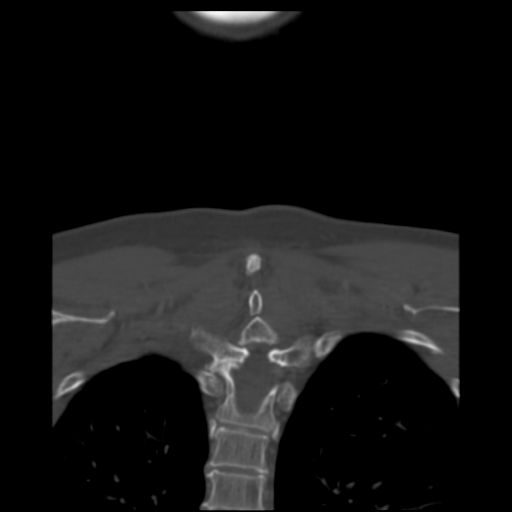

[Series 402: hyoid axials · axial · 0.45mm/px · z∈[-58,+39]mm · 4 of 77 slices shown]
[im 16/77  bone]
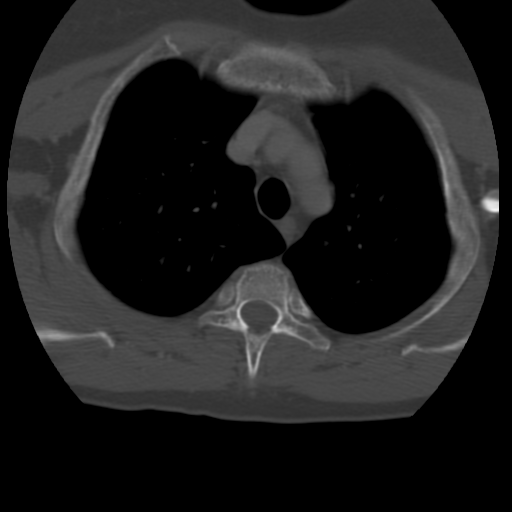
[im 31/77  bone]
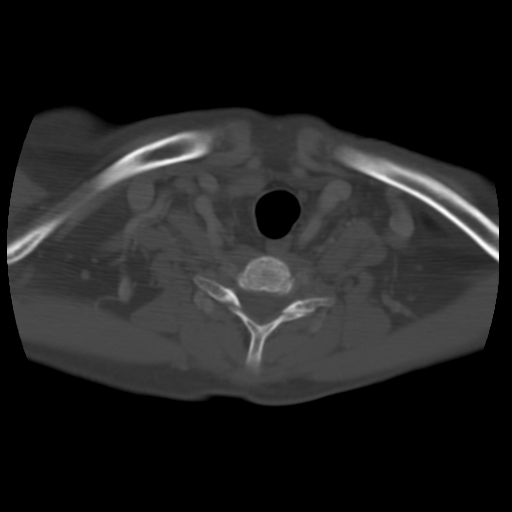
[im 46/77  bone]
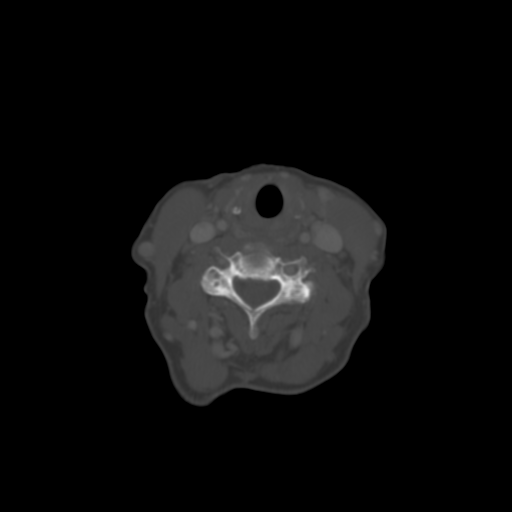
[im 61/77  bone]
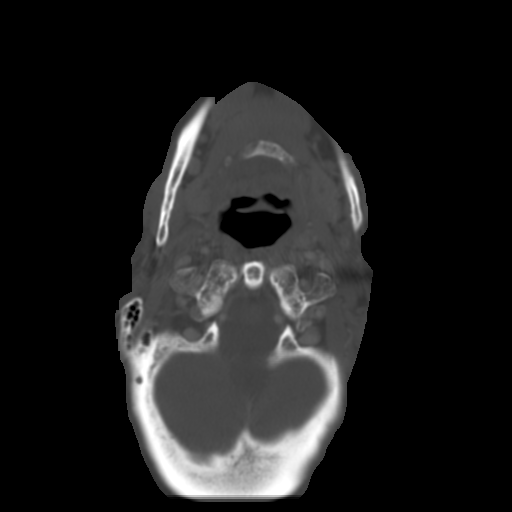

[13 of 27 positions shown; findings below may reference images not displayed]

FINDINGS: There is a round soft tissue mass medial to the
subcondylar right mandible extending toward the right
parapharyngeal space measuring 18 x 18 x 15 mm and unchanged in
configuration since the recent MRI.  In 7997 this lesion measured
12 x 10 x 10 mm.  It has attenuation slightly below that of the
adjacent pterygoid musculature.  It abuts and mildly displaces the
right internal carotid artery medially.  It is inseparable from
both the posterior aspect of the pterygoid musculature and the deep
lobe of the right parotid gland.  No associated osseous changes at
the right skull base.  No cervical lymphadenopathy.  The
superficial lobe of the right parotid is normal.

Stable visualized brain parenchyma. Visualized paranasal sinuses
and mastoids are clear.  Pharyngeal mucosal spaces, larynx and
thyroid are within normal limits.  Visualized lung apices are
clear.  No mediastinal lymphadenopathy. Submandibular glands are
within normal limits.
IMPRESSION: Chronic round soft tissue mass centered at the confluence of the
deep right parotid space and parapharyngeal space.  This is
inseparable from the deep lobe of the parotid and adjacent
pterygoid musculature. No osseous changes or lymphadenopathy.
Favor benign etiology given indolent growth since 7997. Enhancement
at or below the level of skeletal muscle is inconsistent with
paraganglioma.  Main differential considerations therefore include
pleomorphic adenoma and schwannoma.

## 2010-10-04 ENCOUNTER — Ambulatory Visit: Payer: BC Managed Care – PPO | Admitting: Licensed Clinical Social Worker

## 2010-10-14 LAB — CBC
MCHC: 31.8 g/dL (ref 30.0–36.0)
Platelets: 290 10*3/uL (ref 150–400)
RDW: 13.6 % (ref 11.5–15.5)
WBC: 8.9 10*3/uL (ref 4.0–10.5)

## 2010-10-14 LAB — BASIC METABOLIC PANEL
BUN: 9 mg/dL (ref 6–23)
Calcium: 9 mg/dL (ref 8.4–10.5)
Creatinine, Ser: 0.86 mg/dL (ref 0.4–1.2)
GFR calc non Af Amer: >60 mL/min (ref >60)
Glucose, Bld: 93 mg/dL (ref 70–99)

## 2010-10-14 LAB — TYPE AND SCREEN: ABO/RH(D): B POS

## 2010-10-14 LAB — ABO/RH: ABO/RH(D): B POS

## 2010-10-14 LAB — SURGICAL PCR SCREEN: MRSA, PCR: NEGATIVE

## 2010-10-19 LAB — CBC
HCT: 31.4 % — ABNORMAL LOW (ref 36.0–46.0)
HCT: 27.8 % — ABNORMAL LOW (ref 36.0–46.0)
HCT: 32.7 % — ABNORMAL LOW (ref 36.0–46.0)
Hemoglobin: 10.9 g/dL — ABNORMAL LOW (ref 12.0–15.0)
Hemoglobin: 9.4 g/dL — ABNORMAL LOW (ref 12.0–15.0)
MCHC: 33.5 g/dL (ref 30.0–36.0)
MCHC: 33.7 g/dL (ref 30.0–36.0)
MCHC: 33.7 g/dL (ref 30.0–36.0)
MCV: 89.5 fL (ref 78.0–100.0)
MCV: 89 fL (ref 78.0–100.0)
MCV: 89.1 fL (ref 78.0–100.0)
MCV: 90.4 fL (ref 78.0–100.0)
Platelets: 224 10*3/uL (ref 150–400)
Platelets: 175 10*3/uL (ref 150–400)
Platelets: 222 10*3/uL (ref 150–400)
RBC: 3.61 MIL/uL — ABNORMAL LOW (ref 3.87–5.11)
RBC: 3.62 MIL/uL — ABNORMAL LOW (ref 3.87–5.11)
RDW: 14.8 % (ref 11.5–15.5)
RDW: 14.8 % (ref 11.5–15.5)
RDW: 15 % (ref 11.5–15.5)
WBC: 7.7 10*3/uL (ref 4.0–10.5)
WBC: 9.7 10*3/uL (ref 4.0–10.5)

## 2010-10-19 LAB — BASIC METABOLIC PANEL
BUN: 5 mg/dL — ABNORMAL LOW (ref 6–23)
BUN: 7 mg/dL (ref 6–23)
CO2: 22 mEq/L (ref 19–32)
CO2: 23 mEq/L (ref 19–32)
CO2: 23 mEq/L (ref 19–32)
CO2: 24 mEq/L (ref 19–32)
Calcium: 8.7 mg/dL (ref 8.4–10.5)
Calcium: 8.5 mg/dL (ref 8.4–10.5)
Chloride: 107 mEq/L (ref 96–112)
Chloride: 103 mEq/L (ref 96–112)
Chloride: 113 mEq/L — ABNORMAL HIGH (ref 96–112)
Creatinine, Ser: 0.66 mg/dL (ref 0.4–1.2)
Creatinine, Ser: 0.65 mg/dL (ref 0.4–1.2)
Creatinine, Ser: 0.86 mg/dL (ref 0.4–1.2)
GFR calc Af Amer: >60 mL/min (ref >60)
GFR calc Af Amer: >60 mL/min (ref >60)
GFR calc Af Amer: >60 mL/min (ref >60)
GFR calc Af Amer: >60 mL/min (ref >60)
GFR calc non Af Amer: >60 mL/min (ref >60)
GFR calc non Af Amer: >60 mL/min (ref >60)
Glucose, Bld: 97 mg/dL (ref 70–99)
Glucose, Bld: 90 mg/dL (ref 70–99)
Potassium: 3.1 mEq/L — ABNORMAL LOW (ref 3.5–5.1)
Potassium: 3.2 mEq/L — ABNORMAL LOW (ref 3.5–5.1)
Potassium: 3.3 mEq/L — ABNORMAL LOW (ref 3.5–5.1)
Potassium: 3.4 mEq/L — ABNORMAL LOW (ref 3.5–5.1)
Sodium: 141 mEq/L (ref 135–145)
Sodium: 139 mEq/L (ref 135–145)
Sodium: 139 mEq/L (ref 135–145)

## 2010-10-19 LAB — RAPID URINE DRUG SCREEN, HOSP PERFORMED
Cocaine: NOT DETECTED
Opiates: POSITIVE — AB
Tetrahydrocannabinol: NOT DETECTED

## 2010-10-19 LAB — BLOOD GAS, ARTERIAL
Acid-base deficit: 0.8 mmol/L (ref 0.0–2.0)
Drawn by: 31996
FIO2: 0.3 %
MECHVT: 330 mL
O2 Saturation: 99.1 %
RATE: 14 resp/min

## 2010-10-19 LAB — COMPREHENSIVE METABOLIC PANEL
AST: 22 U/L (ref 0–37)
Albumin: 3.5 g/dL (ref 3.5–5.2)
BUN: 16 mg/dL (ref 6–23)
Calcium: 8.5 mg/dL (ref 8.4–10.5)
Chloride: 108 mEq/L (ref 96–112)
Creatinine, Ser: 0.94 mg/dL (ref 0.4–1.2)
GFR calc Af Amer: >60 mL/min (ref >60)
Total Bilirubin: 0.5 mg/dL (ref 0.3–1.2)
Total Protein: 5.8 g/dL — ABNORMAL LOW (ref 6.0–8.3)

## 2010-10-19 LAB — RPR: RPR Ser Ql: NONREACTIVE

## 2010-10-19 LAB — APTT: aPTT: 26 seconds (ref 24–37)

## 2010-10-19 LAB — CARDIAC PANEL(CRET KIN+CKTOT+MB+TROPI)
CK, MB: 2.5 ng/mL (ref 0.3–4.0)
CK, MB: 2.5 ng/mL (ref 0.3–4.0)
Relative Index: 2.5 (ref 0.0–2.5)
Relative Index: INVALID (ref 0.0–2.5)
Total CK: 73 U/L (ref 7–177)
Total CK: 98 U/L (ref 7–177)
Troponin I: 0.02 ng/mL (ref 0.00–0.06)
Troponin I: <0.01 ng/mL (ref 0.00–0.06)

## 2010-10-19 LAB — POCT I-STAT 3, ART BLOOD GAS (G3+)
Bicarbonate: 21.8 mEq/L (ref 20.0–24.0)
O2 Saturation: 100 %
O2 Saturation: 99 %
Patient temperature: 98
Patient temperature: 98.6
TCO2: 22 mmol/L (ref 0–100)
TCO2: 28 mmol/L (ref 0–100)
TCO2: 28 mmol/L (ref 0–100)
pCO2 arterial: 22.1 mmHg — ABNORMAL LOW (ref 35.0–45.0)
pCO2 arterial: 37.4 mmHg (ref 35.0–45.0)
pCO2 arterial: 40.7 mmHg (ref 35.0–45.0)
pH, Arterial: 7.43 — ABNORMAL HIGH (ref 7.350–7.400)
pH, Arterial: 7.467 — ABNORMAL HIGH (ref 7.350–7.400)
pO2, Arterial: 101 mmHg — ABNORMAL HIGH (ref 80.0–100.0)

## 2010-10-19 LAB — DIFFERENTIAL
Eosinophils Relative: 1 % (ref 0–5)
Lymphocytes Relative: 23 % (ref 12–46)
Lymphs Abs: 1.8 10*3/uL (ref 0.7–4.0)
Monocytes Absolute: 0.6 10*3/uL (ref 0.1–1.0)
Monocytes Relative: 8 % (ref 3–12)
Neutro Abs: 5.2 10*3/uL (ref 1.7–7.7)

## 2010-10-19 LAB — HIV ANTIBODY (ROUTINE TESTING W REFLEX): HIV: NONREACTIVE

## 2010-10-19 LAB — GLUCOSE, CAPILLARY
Glucose-Capillary: 100 mg/dL — ABNORMAL HIGH (ref 70–99)
Glucose-Capillary: 101 mg/dL — ABNORMAL HIGH (ref 70–99)
Glucose-Capillary: 109 mg/dL — ABNORMAL HIGH (ref 70–99)
Glucose-Capillary: 115 mg/dL — ABNORMAL HIGH (ref 70–99)
Glucose-Capillary: 119 mg/dL — ABNORMAL HIGH (ref 70–99)
Glucose-Capillary: 139 mg/dL — ABNORMAL HIGH (ref 70–99)
Glucose-Capillary: 92 mg/dL (ref 70–99)
Glucose-Capillary: 94 mg/dL (ref 70–99)

## 2010-10-19 LAB — LACTIC ACID, PLASMA: Lactic Acid, Venous: 2.6 mmol/L — ABNORMAL HIGH (ref 0.5–2.2)

## 2010-10-19 LAB — PROTIME-INR
INR: 1.03 (ref 0.00–1.49)
Prothrombin Time: 13.4 seconds (ref 11.6–15.2)

## 2010-10-19 LAB — CULTURE, BLOOD (ROUTINE X 2): Culture: NO GROWTH

## 2010-10-19 LAB — CK TOTAL AND CKMB (NOT AT ARMC)
Relative Index: INVALID (ref 0.0–2.5)
Total CK: 59 U/L (ref 7–177)

## 2010-10-19 LAB — POTASSIUM: Potassium: 3.6 mEq/L (ref 3.5–5.1)

## 2010-10-19 LAB — FOLATE RBC: RBC Folate: 1040 ng/mL — ABNORMAL HIGH (ref 180–600)

## 2010-10-19 LAB — TSH: TSH: 2.293 u[IU]/mL (ref 0.350–4.500)

## 2010-11-09 LAB — COMPREHENSIVE METABOLIC PANEL WITH GFR
ALT: 12 U/L (ref 0–35)
AST: 16 U/L (ref 0–37)
Albumin: 3 g/dL — ABNORMAL LOW (ref 3.5–5.2)
Alkaline Phosphatase: 57 U/L (ref 39–117)
BUN: 11 mg/dL (ref 6–23)
CO2: 29 meq/L (ref 19–32)
Calcium: 8.6 mg/dL (ref 8.4–10.5)
Chloride: 108 meq/L (ref 96–112)
Creatinine, Ser: 0.85 mg/dL (ref 0.4–1.2)
GFR calc non Af Amer: >60 mL/min
Glucose, Bld: 106 mg/dL — ABNORMAL HIGH (ref 70–99)
Potassium: 3.8 meq/L (ref 3.5–5.1)
Sodium: 140 meq/L (ref 135–145)
Total Bilirubin: 0.5 mg/dL (ref 0.3–1.2)
Total Protein: 5.2 g/dL — ABNORMAL LOW (ref 6.0–8.3)

## 2010-11-09 LAB — DIFFERENTIAL
Basophils Relative: 1 % (ref 0–1)
Eosinophils Absolute: 0.1 10*3/uL (ref 0.0–0.7)
Neutro Abs: 3.1 10*3/uL (ref 1.7–7.7)
Neutrophils Relative %: 57 % (ref 43–77)

## 2010-11-09 LAB — PROTEIN ELECTROPHORESIS, SERUM
Albumin ELP: 62.6 % (ref 55.8–66.1)
Alpha-1-Globulin: 5.5 % — ABNORMAL HIGH (ref 2.9–4.9)
Alpha-2-Globulin: 10 % (ref 7.1–11.8)
Beta 2: 3.7 % (ref 3.2–6.5)
Beta Globulin: 6.9 % (ref 4.7–7.2)
Gamma Globulin: 11.3 % (ref 11.1–18.8)
M-Spike, %: NOT DETECTED g/dL
Total Protein ELP: 5.5 g/dL — ABNORMAL LOW (ref 6.0–8.3)

## 2010-11-09 LAB — CBC
HCT: 31 % — ABNORMAL LOW (ref 36.0–46.0)
Hemoglobin: 10.3 g/dL — ABNORMAL LOW (ref 12.0–15.0)
MCHC: 33.3 g/dL (ref 30.0–36.0)
RBC: 3.56 MIL/uL — ABNORMAL LOW (ref 3.87–5.11)
RDW: 15.6 % — ABNORMAL HIGH (ref 11.5–15.5)

## 2010-11-09 LAB — C-REACTIVE PROTEIN: CRP: 0.1 mg/dL — ABNORMAL LOW

## 2010-12-14 NOTE — Assessment & Plan Note (Signed)
[facility]                         GASTROENTEROLOGY OFFICE NOTE   DEMONICA, FARREY                     MRN:          [id number]  DATE:05/23/2007                            DOB:          [birth year]    PROBLEM:  Abdominal pain.   Caitlin Irwin has returned for scheduled followup.  Upper endoscopy  demonstrated esophagitis and gastritis.  After discontinuing her NSAIDs  and taking Zegerid, Ms. Broyles reports significant improvement in her  upper abdominal pain.  She has occasional crampy lower abdominal pain  which has responded well to anticholinergics.  Asacol was discontinued.   PHYSICAL EXAMINATION:  Pulse 72, blood pressure 96/54.   IMPRESSION:  1. Irritable bowel syndrome:  There is no evidence for colitis.  2. Nonsteroidal-induced upper abdominal pain.   RECOMMENDATIONS:  1. Continue NuLev as needed.  2. I prescribed Celebrex to be used sparingly for her headaches,      provided that she tolerates this GI-wise.  3. She was instructed to continue her Zegerid.     Barbette Hair. Arlyce Dice, MD,FACG  Electronically Signed    RDK/MedQ  DD: 05/23/2007  DT: 05/24/2007  Job #: [id number]   cc:   Erskine Speed, M.D.

## 2010-12-14 NOTE — Consult Note (Signed)
VASCULAR SURGERY CONSULTATION   Caitlin Irwin, Caitlin Irwin  DOB:  July 28, 1951                                       03/24/2010  MVHQI#:[id number]   I saw the patient in the office today in consultation concerning  exposure for an L4 to S1 ALIF.  She was referred by Dr. Shon Baton.  [name] with a long history of degenerative disk  disease.  She had a laminectomy in 1981.  She believes this was at the  L4-L5 level.  She has had pain in her back for many months.  Initially  she had radiation of the pain down her right leg and now she has  radiation of the pain down both legs.  She did try injection therapy  twice which did not help.  Her symptoms have been gradually progressing.  She was evaluated by Dr. Shon Baton and felt to be a good candidate for  ALIF.  We were asked to provide exposure of the L4-L5 and L5-S1 levels.  Of note, the pain in her back is aggravated by any movement.  She cannot  sit or stand for more than 15 minutes without having significant pain.  Her symptoms are alleviated by reclining or taking pain medication.   Her past medical history is significant for history of the closure of a  patent foramen ovale in 2007.  She does have mildly elevated cholesterol  of 260 although she is not on medications for this.  This has not been a  problem in the past.  She denies any history of diabetes, hypertension,  previous history of myocardial infarction, history of congestive heart  failure or history of COPD.   PAST SURGICAL HISTORY:  1. Significant for laminectomy in 1981.  2. Hysterectomy in 1985.  3. Cholecystectomy in 1999.  4. Knee surgery in 1983.  5. Ankle surgery in 2009.  6. Gout surgery in 2009.   SOCIAL HISTORY:  She is married.  She does not have any children.  She  does not use tobacco.   FAMILY HISTORY:  Her mother died from ovarian cancer.  She is unaware of  any history of premature cardiovascular disease.   REVIEW OF  SYSTEMS:  GENERAL:  She has had no weight loss, weight gain or  problems with her appetite.  She is 4 feet 10-1/2 inches tall, 105  pounds.  CARDIOVASCULAR:  She has had no chest pain, chest pressure, palpitations  or arrhythmias.  She has had no claudication, rest pain or nonhealing  ulcers.  She has had no history of stroke, TIAs or amaurosis fugax.  She  has had no history of DVT or phlebitis.  GI:  She does have a history of peptic ulcer disease and reflux which is  under good control.  NEUROLOGIC:  She has had problems with migraine headaches for some time.  PSYCHIATRIC:  She has had anxiety in the past.  Pulmonary, hematologic, GU, ENT, musculoskeletal, integumentary review  of systems is unremarkable and is documented on the medical history form  in her chart.   PHYSICAL EXAMINATION:  General:  This is a pleasant 58-year woman who  appears her stated age.  Vital signs:  Her blood pressure is 129/76,  heart rate is 78, saturation 99%.  HEENT:  Unremarkable.  Lungs:  Are  clear bilaterally to auscultation without  rales, rhonchi or wheezing.  Cardiovascular:  I do not detect any carotid bruits.  She has a regular  rate and rhythm.  She has palpable femoral, popliteal, dorsalis pedis  and posterior tibial pulses bilaterally.  She has no significant lower  extremity swelling.  Abdomen:  Soft and nontender.  She has normal  pitched bowel sounds.  Musculoskeletal:  There are no major deformities  or cyanosis.  Neurological:  She has no focal weakness or paresthesias.  Skin:  There are no ulcers or rashes.   I have independently interpreted her CT scan from March of this year.  I  do not see any significant calcific disease in the infrarenal aorta  which would be a problem with retroperitoneal exposure of the L4-L5, L5-  S1 level.  She does have what appears to be a significant osteophyte at  the L5-S1 level.  In looking at her CAT scan it does look like her  bifurcation of the aorta  is slightly high so it may be possible to  expose the L4-L5 level from the crotch from the bifurcation of the aorta  below the bifurcation of the aorta although again or she may require  immobilization and exposure from the lateral approach to the aorta.  I  have also reviewed her chest x-ray from April of this year, it shows  minimal subsegmental atelectasis of the right lower lobe.   I do think she is a good candidate for retroperitoneal exposure of the  L4-L5, L5-S1 level.  I see no major contraindications.  We have  discussed my role in the procedure and the potential risks involved  including but not limited to major arterial or venous injury, arterial  or venous thrombosis or embolization or major bleeding.  We have also  discussed the risk of lymphedema, nerve injury, wound healing problems.  All of her questions were answered and she is agreeable to proceed.  Her  procedure is scheduled for September 8.     Caitlin Irwin. Edilia Bo, M.D.  Electronically Signed  CSD/MEDQ  D:  03/24/2010  T:  03/25/2010  Job:  [id number]   cc:   Erskine Speed, M.D.  Alvy Beal, MD

## 2010-12-14 NOTE — Letter (Signed)
April 23, 2008    Erskine Speed, M.D.  [address]  [place], [place] [address]   RE:  Caitlin Irwin, Caitlin Irwin  MRN:  [id number]  /  DOB:  [birth year]   Dear Renae Fickle:   I have reviewed Ms. Bertz's office notes and procedure notes.  You have  raised the question whether she can be started on biphosphonates.  If  she is not having ongoing upper GI issues, I think it is certainly  reasonable to try her on this medicine.  Should she not tolerate it,  then obviously you have the option to discontinue it.  There are no  specific contraindications to using this medicine in the absence of  active GI pathology.    Sincerely,      Barbette Hair. Arlyce Dice, MD,FACG  Electronically Signed    RDK/MedQ  DD: 04/23/2008  DT: 04/24/2008  Job #: [id number]

## 2010-12-14 NOTE — Discharge Summary (Signed)
Caitlin Irwin, Caitlin Irwin NO.:  [id number]   MEDICAL RECORD NO.:  [id number]          PATIENT TYPE:  INP   LOCATION:  3015                         FACILITY:  [facility]   PHYSICIAN:  Melvyn Novas, M.D.  DATE OF BIRTH:  04-05-1951   DATE OF ADMISSION:  12/11/2008  DATE OF DISCHARGE:                               DISCHARGE SUMMARY   Caitlin Irwin is an established chronic headache patient in our  practice and had until 6 years ago [facility]  The patient has a history of depression, GERD,  recently developed stomach ulcers followed by Dr. Arlyce Dice, GI at [facility]  and has abolished all nonsteroidal medications.  She is still taking  almost daily Triptans for which she pays out of her pocket due to lack  of insurance coverage.  The admission for DHE protocol by Roselie Skinner  was made after the patient experienced sudden electric shock sensations  in both upper extremities after taking Triptans indicating that she had  either taken too higher dose or had become sensitized in some other way.  Please review the initial H&P for further details.   The patient received here Pristiq for which she takes her own supply 50  mg a day, bupropion 150 mg 2 tablets in the morning extended release,  her home supply is 300 mg XR.  She is allowed to take 2 mg clonazepam at  night p.r.n. insomnia.  She is taking a new form of omeprazole for  stomach ulcer that was not on the hospital formulary and therefore was  allowed to take her home supply.  She is on Synthroid 75 mcg.  She is on  Valtrex daily, Aldactone, Premarin.   The patient have stable vital signs, no tachycardia, no blood pressure  elevation after she started with her DHE protocol, which was initiated  with some delay.  The [facility] protocol was copied by pharmacy, which  I appreciate very much and this new order sheet was placed in the  patient's chart.  It reads that she receives  daily 1 g of Solu-Medrol  following 30 minutes in time, a dose of Reglan 10 mg IV.  She also  receives a milligram of dihydroergotamine 30 minutes after Reglan.  She  is also taking Carafate for nausea 1 teaspoon between meals.  I reviewed  her CT scan unenhanced of the skull, which shows no edema, swelling, or  excessive scar formation.  There is also no evidence of severe  sinusitis.  The patient had just undergone earlier this year a surgery  for migraine and tension headache in a [facility] private clinic, but  derived no benefit from this.  Trigger point injections had also only  given a relief of 3-4 days at a time.  In the past, she had sometimes  responded to Botox.   Again, I expect [name] to be ready for discharge on Saturday  afternoon.  I would like her to not take Triptans for at least another  week and not to take nonsteroidal anti-inflammatory drugs.  I would like  her  to follow up with  one appointment at the [facility].  She had previously been  referred to the pain center, but at that time her insurance would not  cover, now she is requesting an evaluation with Dr. Thyra Breed as her  insurance has changed.       Melvyn Novas, M.D.  Electronically Signed     CD/MEDQ  D:  12/12/2008  T:  12/13/2008  Job:  [id number]   cc:   Erskine Speed, M.D.  Barbette Hair. Arlyce Dice, MD,FACG
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
## 2010-12-14 NOTE — Assessment & Plan Note (Signed)
[facility]                         GASTROENTEROLOGY OFFICE NOTE   [name], [name]                     MRN:          [id number]  DATE:04/20/2007                            DOB:          [birth year]    REASON FOR CONSULTATION:  Abdominal pain.   [name] is a pleasant 60 year old white female, referred through the  courtesy of Dr. Chilton Si for evaluation.  She recently underwent  colonoscopy for symptoms of abdominal bloating and pain.  Tests revealed  melanosis coli.  [name] complains of severe, sharp, crampy lower  abdominal pain.  This is intermittent.  It causes her to double over,  and she may be incapacitated for about an hour.  It is not related to  bowel movements.  She also has more chronic and upper abdominal pain  that is described as an aching type of dyspeptic pain that she relates  to previous ulcer disease.  She takes Prevacid twice a day.  Various GI  cocktails, including antacids, may partially relieve this discomfort.  She does take ibuprofen at least once a day.  She denies nausea or  dysphagia.  There is no history of melena or hematochezia.  She does  suffer from moderate, chronic constipation.   PAST MEDICAL HISTORY:  Pertinent for hypothyroidism.  She is status post  cholecystectomy and hysterectomy.   FAMILY HISTORY:  Pertinent for mother with ovarian cancer.   MEDICATIONS:  1. Aldactone.  2. Valtrex.  3. Premarin.  4. Synthroid.  5. Prozac.  6. Asacol.  7. Klonopin.  8. Trazodone.  9. Prevacid.  10.Benadryl as needed.   She has no allergies.   She does not smoke or drink.  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   REVIEW OF SYSTEMS:  Reviewed and pertinent for fatigue and back pain.   PHYSICAL EXAMINATION:  Pulse 80, blood pressure 100/62, weight 116.  HEENT: EOMI.  PERRLA.  Sclerae are anicteric.  Conjunctivae are pink.  NECK:  Supple without thyromegaly,  adenopathy or carotid bruits.  CHEST:  Clear to auscultation and percussion without adventitious  sounds.  CARDIAC:  Regular rhythm; normal S1 S2.  There are no murmurs, gallops  or rubs.  ABDOMEN:  She has mild right lower quadrant tenderness without guarding  or rebound.  There are no abdominal masses or organomegaly.  EXTREMITIES:  Full range of motion.  No cyanosis, clubbing or edema.  RECTAL:  Deferred.   IMPRESSION:  1. Lower abdominal pain.  I suspect this is related to colonic spasm.      She suffers from chronic constipation which is certainly      contributory.  2. Upper abdominal pain secondary to ulcer and non-ulcerative      dyspepsia.  Ibuprofen may be contributing.   RECOMMENDATION:  1. Fiber supplementation daily.  2. NuLev 0.25 mg sublingual q.4 h. p.r.n.  3. Trial of Zegerid 40 mg daily.  4. Upper endoscopy.     Barbette Hair. Arlyce Dice, MD,FACG  Electronically Signed    RDK/MedQ  DD: 04/20/2007  DT: 04/20/2007  Job #:  [id number]   cc:   Erskine Speed, M.D.
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
## 2010-12-17 NOTE — H&P (Signed)
NAME:  Caitlin Irwin, Caitlin Irwin NO.:  [id number]   MEDICAL RECORD NO.:  [id number]                   PATIENT TYPE:  INP   LOCATION:  3001                                 FACILITY:  [facility]   PHYSICIAN:  Melvyn Novas, M.D.               DATE OF BIRTH:  10-21-1950   DATE OF ADMISSION:  09/08/2003  DATE OF DISCHARGE:                                HISTORY & PHYSICAL   The patient is a 60 year old Caucasian, right-handed female who will be  directly admitted to Room 3001 at [facility].  The patient has been  seen in our office for chronic headache treatment and has in the past been  seen by Dr. Leafy Ro and had been followed by a pain clinic.  She  suffers lifelong for migraines since teenage and has been doing very poorly  over the last year.  She attributes the worsening partially to social  stressors in her second marriage with the relationship to her stepchild and  partially to discrepancies in the spouse's financial situations that are  resulting in a role reversal, and she feels some resentment in her  relationship.  Caitlin Irwin had been recently seen for Botox injections at  our office.  She felt initially improvement within six weeks.  She has had  no longer any benefit from it, but we felt it was not safe to apply Botox so  soon after the initial injection again.  She has used daily Imitrex,  Valtrex.  and Klonopin.  She uses Topamax and Prozac for prevention and  sometimes Trazodone for sleep induction.  Given poly pharmacy and the  history of nontransformed migraine with daily rebound or possible status  migrainosus and the association with concentration difficulties, performance  lapses at work, and constant nausea, we decided that it would be safest for  the patient to be admitted for a DHE protocol after Silvadene.   HOME MEDICATIONS:  1. Trazodone 50 mg 1/2 pill at night.  2. Topamax 100 mg b.i.d.  3. Clonazepam 0.5 mg 1/2 b.i.d.  p.r.n.  4. Premarin 0.625 once a day.  5. Valtrex 500 mg once daily.  6. Synthroid 0.075 one a day.  7. Prevacid 30 mg once daily.  8. Fluoxetine 20 mg once a day.  9. Spirolactone 100 mg once daily.  10.      Amerge 2.5 q.h.s. or as needed.  11.      Imitrex 100 mg p.r.n.   SOCIAL HISTORY:  The patient has remarried and once divorced.  Habits:  Minimal caffeine intake, occasional alcohol use.  She has not had any  alcoholic beverages in over a year.  She has never smoked.  Occupational  History:  High school graduate, business college graduate, working as a  Holiday representative.   FAMILY MEDICAL HISTORY:  Positive for migraine headaches.  Her mother  suffered of ovarian cancer and died in her 32s.  REVIEW OF SYSTEMS:  Easy bruising, constant headaches, nausea.   PHYSICAL EXAMINATION:  VITAL SIGNS:  The patient has a heart rate of 66,  respiratory rate 16, blood pressure 123/75, temperature 97.3.  Height 4 feet  10 inches, weight 105 pounds.  GENERAL:  No clubbing, edema, cyanosis.  No carotid bruits.  Mucous  membranes well perfused.  HEENT:  Atraumatic skull.  NEUROLOGIC:  Mental status:  Awake, alert, cooperative.  Cranial Nerves:  Full visual fields.  No papiledema.  Extraocular movements full and  conjugate.  Facial strength is bilaterally equal as is sensory.  Shoulder  shrug bilaterally equal.  Tongue and uvula midline.  Rinne and Weber  nonlateralized.  No dysphagia or dyspraxia or dysarthria was complained  about.  Motor testing: Equal strength, tone, and mass, 5/5.  Sensory testing  intact to cortical modalities except for the constant ache over her left  temple.  Cerebral examination:  Good finger-to-nose test and rapid  alternating movements.  No tremor, ataxia, or dysmetria.  Gait and station:  Romberg is negative.  Tone __________ was intact.  Spontaneous gait is  narrow based.  Reflexes normal and symmetric.   PLAN:  Admit to 3001 for DHE protocol.  Use 1 mg  for initial injection IV,  then q.8h. 0.5.  Do not exceed 2 mg daily.  Associated will be doses of  steroids and Phenergan.  The patient will continue topiramate at 100 mg  b.i.d.  We discontinued the Trazodone use, and I think she does not need the  Trazodone at all.  We might be able to reduce the Klonopin use as well given  that she only takes a half of 0.5 mg.   ICD code:  1. Intractable daily migraines, status migrainosus 346.11  2. 346.81  Tension headache, mixed headache type with constant nausea.                                                Melvyn Novas, M.D.    CD/MEDQ  D:  09/08/2003  T:  09/09/2003  Job:  [id number]   cc:   Erskine Speed, M.D.  [address] [place]  [place]  [place] [address]  Fax: [phone]

## 2010-12-17 NOTE — Discharge Summary (Signed)
NAME:  Caitlin Irwin, Caitlin Irwin NO.:  [id number]   MEDICAL RECORD NO.:  [id number]                   PATIENT TYPE:  INP   LOCATION:  3001                                 FACILITY:  [facility]   PHYSICIAN:  Melvyn Novas, M.D.               DATE OF BIRTH:  [birth year]   DATE OF ADMISSION:  09/08/2003  DATE OF DISCHARGE:  09/12/2003                                 DISCHARGE SUMMARY   HISTORY OF PRESENT ILLNESS:  This patient was admitted to the neurology  service, Dr. Porfirio Mylar Dohmeier, for an intractable headache and was admitted  for DHE protocol.  The patient is a chronic headache patient since her  childhood.  She is a 60 year old Caucasian female who recently had  exacerbation of headaches to daily migrainous and tension type headaches,  mixed type, associated with nausea.   DISCHARGE MEDICATIONS:  1. She was discharged on Topamax 500 mg b.i.d.  2. Klonopin p.r.n. insomnia at 0.5 mg.  3. Premarin 0.625 mg every day.  4. Valtrex 500 mg every day  5. Synthroid 0.075 mg every day  6. Prevacid one a day.  7. Prozac 20 mg every day.  8. Spironolactone 100 mg every day.  9. Amerge p.r.n.  10.      Imitrex p.r.n.   HOSPITAL COURSE:  The patient received a total of 8.5 mg of DHE during her  hospitalization.   The patient had to use those prior to her admission daily but had 48  headache free hours during her DHE treatment so that we assume she is able  to stay away from triptin medications for at least 48 hours.  A CT of the  head was performed during her hospitalization and was negative on September 08, 2003.  There was an MRI with MRA obtained during her hospitalization that  showed no atherosclerosis but tortuous stenosis in the MCAs bilateral  trifurcation region.  Beaded cork-screw like appearance of the proximal MCA  was described, interpreted by Dr. Maryelizabeth Rowan.  The patient was  discharged to her private home after completion of DHE protocol.                                              Melvyn Novas, M.D.    CD/MEDQ  D:  10/03/2003  T:  10/05/2003  Job:  [id number]   cc:   Erskine Speed, M.D.  [address]  [place]  [place] [address]  Fax: [phone]

## 2011-01-14 ENCOUNTER — Ambulatory Visit (INDEPENDENT_AMBULATORY_CARE_PROVIDER_SITE_OTHER): Payer: BC Managed Care – PPO | Admitting: Licensed Clinical Social Worker

## 2011-01-14 DIAGNOSIS — F331 Major depressive disorder, recurrent, moderate: Secondary | ICD-10-CM

## 2011-02-11 ENCOUNTER — Ambulatory Visit (INDEPENDENT_AMBULATORY_CARE_PROVIDER_SITE_OTHER): Payer: No Typology Code available for payment source | Admitting: Licensed Clinical Social Worker

## 2011-02-11 DIAGNOSIS — F331 Major depressive disorder, recurrent, moderate: Secondary | ICD-10-CM

## 2011-03-18 ENCOUNTER — Ambulatory Visit (INDEPENDENT_AMBULATORY_CARE_PROVIDER_SITE_OTHER): Payer: No Typology Code available for payment source | Admitting: Licensed Clinical Social Worker

## 2011-03-18 DIAGNOSIS — F331 Major depressive disorder, recurrent, moderate: Secondary | ICD-10-CM

## 2011-04-15 ENCOUNTER — Ambulatory Visit (INDEPENDENT_AMBULATORY_CARE_PROVIDER_SITE_OTHER): Payer: No Typology Code available for payment source | Admitting: Licensed Clinical Social Worker

## 2011-04-15 DIAGNOSIS — F331 Major depressive disorder, recurrent, moderate: Secondary | ICD-10-CM

## 2011-04-22 LAB — BASIC METABOLIC PANEL
Calcium: 8.2 — ABNORMAL LOW
GFR calc Af Amer: >60
GFR calc non Af Amer: >60
Potassium: 3.8
Sodium: 137

## 2011-04-22 LAB — DIFFERENTIAL
Basophils Absolute: 0
Lymphocytes Relative: 25
Lymphs Abs: 2
Monocytes Absolute: 0.5
Monocytes Relative: 6
Neutro Abs: 5.5

## 2011-04-22 LAB — CBC
Hemoglobin: 11.4 — ABNORMAL LOW
RBC: 3.8 — ABNORMAL LOW
RDW: 14.6
WBC: 8.2

## 2011-04-22 LAB — POCT CARDIAC MARKERS
CKMB, poc: 1.3
Myoglobin, poc: 95.4
Operator id: 192351
Troponin i, poc: <0.05
Troponin i, poc: <0.05

## 2011-04-22 LAB — URINALYSIS, ROUTINE W REFLEX MICROSCOPIC
Bilirubin Urine: NEGATIVE
Ketones, ur: NEGATIVE
Nitrite: NEGATIVE
pH: 6

## 2011-04-22 LAB — APTT: aPTT: 26

## 2011-05-06 ENCOUNTER — Ambulatory Visit: Payer: No Typology Code available for payment source | Admitting: Licensed Clinical Social Worker

## 2011-05-13 ENCOUNTER — Telehealth: Payer: Self-pay | Admitting: Gastroenterology

## 2011-05-13 ENCOUNTER — Ambulatory Visit (INDEPENDENT_AMBULATORY_CARE_PROVIDER_SITE_OTHER): Payer: No Typology Code available for payment source | Admitting: Licensed Clinical Social Worker

## 2011-05-13 DIAGNOSIS — F331 Major depressive disorder, recurrent, moderate: Secondary | ICD-10-CM

## 2011-05-13 MED ORDER — OMEPRAZOLE 40 MG PO CPDR: 40.0000 mg | DELAYED_RELEASE_CAPSULE | Freq: Two times a day (BID) | ORAL | Status: DC

## 2011-05-13 NOTE — Telephone Encounter (Signed)
ok 

## 2011-05-13 NOTE — Telephone Encounter (Signed)
Pt stated that her Nexium is 240$ per month now, she can not afford FYI- Dr Arlyce Dice, I sent her Omeprazole to use 1-2 times a day prn.

## 2011-06-06 ENCOUNTER — Telehealth: Payer: Self-pay | Admitting: Gastroenterology

## 2011-06-06 MED ORDER — PANTOPRAZOLE SODIUM 40 MG PO TBEC: 40.0000 mg | DELAYED_RELEASE_TABLET | Freq: Every day | ORAL | Status: DC

## 2011-06-06 NOTE — Telephone Encounter (Signed)
Pt is in the doughnut hole with Nexium, Offered pt samples, will pick up tomorrow

## 2011-06-10 ENCOUNTER — Ambulatory Visit: Payer: No Typology Code available for payment source | Admitting: Licensed Clinical Social Worker

## 2011-06-13 ENCOUNTER — Other Ambulatory Visit: Payer: Self-pay | Admitting: Internal Medicine

## 2011-06-13 ENCOUNTER — Ambulatory Visit
Admission: RE | Admit: 2011-06-13 | Discharge: 2011-06-13 | Disposition: A | Discharge status: Home / Self Care | Payer: Medicare Other | Source: Ambulatory Visit | Attending: Internal Medicine | Admitting: Internal Medicine

## 2011-06-13 DIAGNOSIS — J40 Bronchitis, not specified as acute or chronic: Secondary | ICD-10-CM

## 2011-06-13 DIAGNOSIS — R05 Cough: Secondary | ICD-10-CM

## 2011-06-17 ENCOUNTER — Other Ambulatory Visit: Payer: Self-pay | Admitting: Gastroenterology

## 2011-06-17 MED ORDER — ESOMEPRAZOLE MAGNESIUM 40 MG PO CPDR: 40.0000 mg | DELAYED_RELEASE_CAPSULE | Freq: Two times a day (BID) | ORAL | Status: DC

## 2011-06-17 NOTE — Telephone Encounter (Signed)
Pt wants script mailed to her so she can get her Nexium cheaper through a mail order [place] pharmacy  Will mail out Nexium script today

## 2011-07-06 ENCOUNTER — Telehealth: Payer: Self-pay | Admitting: Gastroenterology

## 2011-07-06 NOTE — Telephone Encounter (Signed)
Pt was sent in protonix to her pharmacy. It was an electronic refill request from pharmacy. Told pt she could take that. It is only costing her 4$. She asked could she take two a day and I told her that would be fine.

## 2011-07-07 NOTE — Telephone Encounter (Signed)
ok 

## 2011-08-02 ENCOUNTER — Other Ambulatory Visit: Payer: Self-pay | Admitting: Gastroenterology

## 2011-08-25 ENCOUNTER — Other Ambulatory Visit: Payer: Self-pay | Admitting: Gastroenterology

## 2011-08-25 MED ORDER — HYOSCYAMINE SULFATE 0.125 MG PO TABS: 0.1250 mg | ORAL_TABLET | ORAL | Status: AC | PRN

## 2011-08-25 MED ORDER — PANTOPRAZOLE SODIUM 40 MG PO TBEC: 40.0000 mg | DELAYED_RELEASE_TABLET | Freq: Every day | ORAL | Status: DC

## 2011-08-25 NOTE — Telephone Encounter (Signed)
Informed meds sent to pharmacy

## 2011-09-05 ENCOUNTER — Other Ambulatory Visit: Payer: Self-pay | Admitting: Internal Medicine

## 2011-09-05 DIAGNOSIS — Z1231 Encounter for screening mammogram for malignant neoplasm of breast: Secondary | ICD-10-CM

## 2011-09-26 ENCOUNTER — Ambulatory Visit (INDEPENDENT_AMBULATORY_CARE_PROVIDER_SITE_OTHER): Payer: Medicare Other | Admitting: Gastroenterology

## 2011-09-26 ENCOUNTER — Encounter: Payer: Self-pay | Admitting: Gastroenterology

## 2011-09-26 ENCOUNTER — Telehealth: Payer: Self-pay | Admitting: Gastroenterology

## 2011-09-26 DIAGNOSIS — R635 Abnormal weight gain: Secondary | ICD-10-CM | POA: Insufficient documentation

## 2011-09-26 DIAGNOSIS — R1310 Dysphagia, unspecified: Secondary | ICD-10-CM | POA: Insufficient documentation

## 2011-09-26 NOTE — Telephone Encounter (Signed)
Pt states she is having difficulty swallowing and problems with bloating. Pt requesting to be seen. Pt scheduled to see Dr. Arlyce Dice today at 2:45pm. Pt aware of appt date and time.

## 2011-09-26 NOTE — Assessment & Plan Note (Signed)
Symptoms could be do to a peptic stricture.  Recommendations #1 upper endoscopy with dilatation as indicated

## 2011-09-26 NOTE — Assessment & Plan Note (Signed)
Plan consult with nutrition

## 2011-09-26 NOTE — Progress Notes (Signed)
History of Present Illness:  Caitlin Irwin has returned for evaluation of dysphagia. She's complaining of dysphagia to solids. When this occurs she also has postprandial fullness. She denies pyrosis.  Endoscopy in July, 2011 showed mild gastritis.     Review of Systems:  She is complaining of a 15 pound weight over the past year despite exercising and watching her diet. Parents has apparently had been normal. Pertinent positive and negative review of systems were noted in the above HPI section. All other review of systems were otherwise negative.    Current Medications, Allergies, Past Medical History, Past Surgical History, Family History and Social History were reviewed in [employer] electronic medical record  Vital signs were reviewed in today's medical record. Physical Exam: General: Well developed , well nourished, no acute distress Head: Normocephalic and atraumatic Eyes:  sclerae anicteric, EOMI Ears: Normal auditory acuity Mouth: No deformity or lesions Lungs: Clear throughout to auscultation Heart: Regular rate and rhythm; no murmurs, rubs or bruits Abdomen: Soft, non tender and non distended. No masses, hepatosplenomegaly or hernias noted. Normal Bowel sounds Rectal:deferred Musculoskeletal: Symmetrical with no gross deformities  Pulses:  Normal pulses noted Extremities: No clubbing, cyanosis, edema or deformities noted Neurological: Alert oriented x 4, grossly nonfocal Psychological:  Alert and cooperative. Normal mood and affect

## 2011-09-26 NOTE — Patient Instructions (Signed)
Upper GI Endoscopy Upper GI endoscopy means using a flexible scope to look at the esophagus, stomach, and upper small bowel. This is done to make a diagnosis in people with heartburn, abdominal pain, or abnormal bleeding. Sometimes an endoscope is needed to remove foreign bodies or food that become stuck in the esophagus; it can also be used to take biopsy samples. For the best results, do not eat or drink for 8 hours before having your upper endoscopy.  To perform the endoscopy, you will probably be sedated and your throat will be numbed with a special spray. The endoscope is then slowly passed down your throat (this will not interfere with your breathing). An endoscopy exam takes 15 to 30 minutes to complete and there is no real pain. Patients rarely remember much about the procedure. The results of the test may take several days if a biopsy or other test is taken.  You may have a sore throat after an endoscopy exam. Serious complications are very rare. Stick to liquids and soft foods until your pain is better. Do not drive a car or operate any dangerous equipment for at least 24 hours after being sedated. SEEK IMMEDIATE MEDICAL CARE IF:   You have severe throat pain.   You have shortness of breath.   You have bleeding problems.   You have a fever.   You have difficulty recovering from your sedation.  Document Released: 08/25/2004 Document Revised: 03/30/2011 Document Reviewed: 07/20/2008 [facility] Patient Information 2012 [place], [place].   Your Endoscopy is scheduled on 09/29/2011 at 2pm We will refer you for a nutrient consult and call you when it becomes available

## 2011-09-28 ENCOUNTER — Ambulatory Visit
Admission: RE | Admit: 2011-09-28 | Discharge: 2011-09-28 | Disposition: A | Discharge status: Home / Self Care | Payer: Medicare Other | Source: Ambulatory Visit | Attending: Internal Medicine | Admitting: Internal Medicine

## 2011-09-28 DIAGNOSIS — Z1231 Encounter for screening mammogram for malignant neoplasm of breast: Secondary | ICD-10-CM

## 2011-09-29 ENCOUNTER — Encounter: Payer: Self-pay | Admitting: Gastroenterology

## 2011-09-29 ENCOUNTER — Ambulatory Visit (AMBULATORY_SURGERY_CENTER): Payer: Medicare Other | Admitting: Gastroenterology

## 2011-09-29 VITALS — BP 133/73 | HR 73 | Temp 98.5°F | Resp 20 | Ht 59.0 in | Wt 125.0 lb

## 2011-09-29 DIAGNOSIS — K222 Esophageal obstruction: Secondary | ICD-10-CM

## 2011-09-29 DIAGNOSIS — K269 Duodenal ulcer, unspecified as acute or chronic, without hemorrhage or perforation: Secondary | ICD-10-CM

## 2011-09-29 DIAGNOSIS — K2960 Other gastritis without bleeding: Secondary | ICD-10-CM

## 2011-09-29 DIAGNOSIS — K259 Gastric ulcer, unspecified as acute or chronic, without hemorrhage or perforation: Secondary | ICD-10-CM

## 2011-09-29 DIAGNOSIS — R1310 Dysphagia, unspecified: Secondary | ICD-10-CM

## 2011-09-29 MED ORDER — SODIUM CHLORIDE 0.9 % IV SOLN: 500.0000 mL | INTRAVENOUS | Status: DC

## 2011-09-29 MED ORDER — ESOMEPRAZOLE MAGNESIUM 40 MG PO CPDR: DELAYED_RELEASE_CAPSULE | ORAL | Status: DC

## 2011-09-29 NOTE — Progress Notes (Signed)
Patient did not experience any of the following events: a burn prior to discharge; a fall within the facility; wrong site/side/patient/procedure/implant event; or a hospital transfer or hospital admission upon discharge from the facility. (G8907) Patient did not have preoperative order for IV antibiotic SSI prophylaxis. (G8918)  

## 2011-09-29 NOTE — Patient Instructions (Signed)
Discharge instructions given with verbal understanding. Avoid NSAIDS . Barium Swallow office will call to schedule.YOU HAD AN ENDOSCOPIC PROCEDURE TODAY AT THE [facility]: Refer to the procedure report that was given to you for any specific questions about what was found during the examination.  If the procedure report does not answer your questions, please call your gastroenterologist to clarify.  If you requested that your care partner not be given the details of your procedure findings, then the procedure report has been included in a sealed envelope for you to review at your convenience later.  YOU SHOULD EXPECT: Some feelings of bloating in the abdomen. Passage of more gas than usual.  Walking can help get rid of the air that was put into your GI tract during the procedure and reduce the bloating. If you had a lower endoscopy (such as a colonoscopy or flexible sigmoidoscopy) you may notice spotting of blood in your stool or on the toilet paper. If you underwent a bowel prep for your procedure, then you may not have a normal bowel movement for a few days.  DIET: Your first meal following the procedure should be a light meal and then it is ok to progress to your normal diet.  A half-sandwich or bowl of soup is an example of a good first meal.  Heavy or fried foods are harder to digest and may make you feel nauseous or bloated.  Likewise meals heavy in dairy and vegetables can cause extra gas to form and this can also increase the bloating.  Drink plenty of fluids but you should avoid alcoholic beverages for 24 hours.  ACTIVITY: Your care partner should take you home directly after the procedure.  You should plan to take it easy, moving slowly for the rest of the day.  You can resume normal activity the day after the procedure however you should NOT DRIVE or use heavy machinery for 24 hours (because of the sedation medicines used during the test).    SYMPTOMS TO REPORT IMMEDIATELY: A  gastroenterologist can be reached at any hour.  During normal business hours, 8:30 AM to 5:00 PM Monday through Friday, call [phone].  After hours and on weekends, please call the GI answering service at [phone] who will take a message and have the physician on call contact you.   Following upper endoscopy (EGD)  Vomiting of blood or coffee ground material  New chest pain or pain under the shoulder blades  Painful or persistently difficult swallowing  New shortness of breath  Fever of 100F or higher  Black, tarry-looking stools  FOLLOW UP: If any biopsies were taken you will be contacted by phone or by letter within the next 1-3 weeks.  Call your gastroenterologist if you have not heard about the biopsies in 3 weeks.  Our staff will call the home number listed on your records the next business day following your procedure to check on you and address any questions or concerns that you may have at that time regarding the information given to you following your procedure. This is a courtesy call and so if there is no answer at the home number and we have not heard from you through the emergency physician on call, we will assume that you have returned to your regular daily activities without incident.  SIGNATURES/CONFIDENTIALITY: You and/or your care partner have signed paperwork which will be entered into your electronic medical record.  These signatures attest to the fact that that the information  above on your After Visit Summary has been reviewed and is understood.  Full responsibility of the confidentiality of this discharge information lies with you and/or your care-partner.  

## 2011-09-29 NOTE — Op Note (Signed)
[facility] [address] [place], [place]  [address]  ENDOSCOPY PROCEDURE REPORT  PATIENT:  Caitlin, Irwin  MR#:  [id number] BIRTHDATE:  Mar 17, 1951, 60 yrs. old  GENDER:  female  ENDOSCOPIST:  Barbette Hair. Arlyce Dice, MD Referred by:  Nila Nephew, M.D.  PROCEDURE DATE:  09/29/2011 PROCEDURE:  EGD with biopsy, [address], Maloney Dilation of Esophagus ASA CLASS:  Class II INDICATIONS:  dysphagia  MEDICATIONS:   MAC sedation, administered by CRNA propofol 100mg IV, glycopyrrolate (Robinal) 0.2 mg IV, 0.6cc simethancone 0.6 cc PO TOPICAL ANESTHETIC:  DESCRIPTION OF PROCEDURE:   After the risks and benefits of the procedure were explained, informed consent was obtained.  The LB [id number] endoscope was introduced through the mouth and advanced to the third portion of the duodenum.  The instrument was slowly withdrawn as the mucosa was fully examined. <<PROCEDUREIMAGES>>  An ulcer was found pyloric channel 1cm clean-based ulcer. Bxs taken (see image2).  tortuous in the total esophagus. Tortuous esophagus with obvious stricture. #52Fr maloney dilator passed with minimal resistance (see image4).  Otherwise the examination was normal (see image1 and image3).    Retroflexed views revealed no abnormalities.    The scope was then withdrawn from the patient and the procedure completed.  COMPLICATIONS:  None  ENDOSCOPIC IMPRESSION: 1) Ulcer in the pyloric channel 2) Tortuous in the total esophagus 3) Otherwise normal examination RECOMMENDATIONS: 1) Avoid NSAIDS 2) continue protonix 3) barium swallow 4) OV 3 weeks  ______________________________ Barbette Hair. Arlyce Dice, MD  CC:  n. eSIGNED:   Barbette Hair. Konnie Noffsinger at 09/29/2011 02:41 PM  Rowe Clack, [phone]

## 2011-09-30 ENCOUNTER — Other Ambulatory Visit: Payer: Self-pay | Admitting: Gastroenterology

## 2011-09-30 ENCOUNTER — Telehealth: Payer: Self-pay

## 2011-09-30 NOTE — Telephone Encounter (Signed)
Left message on answering machine. 

## 2011-09-30 NOTE — Telephone Encounter (Signed)
Pt scheduled for barium swallow @ [facility] arrival time 9:15am for a 9:30am appt. Pt to report to the radiology dept. Left message for pt to call back.  Spoke with pt and she is aware of appt date and time.

## 2011-10-03 ENCOUNTER — Telehealth: Payer: Self-pay | Admitting: Gastroenterology

## 2011-10-03 NOTE — Telephone Encounter (Signed)
Pt called wanting to make sure she understood about her appt tomorrow for her barium swallow. Reviewed instructions and the appt date and time with pt. Pt verbalized understanding.

## 2011-10-04 ENCOUNTER — Other Ambulatory Visit (HOSPITAL_COMMUNITY): Payer: Medicare Other

## 2011-10-07 ENCOUNTER — Encounter: Payer: Self-pay | Admitting: Gastroenterology

## 2011-10-11 ENCOUNTER — Ambulatory Visit: Payer: Medicare Other | Admitting: Gastroenterology

## 2011-10-12 ENCOUNTER — Ambulatory Visit (HOSPITAL_COMMUNITY)
Admission: RE | Admit: 2011-10-12 | Discharge: 2011-10-12 | Disposition: A | Discharge status: Home / Self Care | Payer: Medicare Other | Source: Ambulatory Visit | Attending: Gastroenterology | Admitting: Gastroenterology

## 2011-10-12 DIAGNOSIS — K222 Esophageal obstruction: Secondary | ICD-10-CM | POA: Insufficient documentation

## 2011-10-12 DIAGNOSIS — R1310 Dysphagia, unspecified: Secondary | ICD-10-CM | POA: Insufficient documentation

## 2011-10-20 ENCOUNTER — Ambulatory Visit (INDEPENDENT_AMBULATORY_CARE_PROVIDER_SITE_OTHER): Payer: Medicare Other | Admitting: Gastroenterology

## 2011-10-20 ENCOUNTER — Encounter: Payer: Self-pay | Admitting: Gastroenterology

## 2011-10-20 VITALS — BP 110/66 | HR 76 | Ht 59.0 in | Wt 123.2 lb

## 2011-10-20 DIAGNOSIS — Z8669 Personal history of other diseases of the nervous system and sense organs: Secondary | ICD-10-CM | POA: Insufficient documentation

## 2011-10-20 DIAGNOSIS — R1310 Dysphagia, unspecified: Secondary | ICD-10-CM

## 2011-10-20 MED ORDER — DICLOFENAC-MISOPROSTOL 50-0.2 MG PO TABS: 1.0000 | ORAL_TABLET | Freq: Two times a day (BID) | ORAL | Status: DC

## 2011-10-20 NOTE — Patient Instructions (Signed)
Follow up as needed

## 2011-10-20 NOTE — Assessment & Plan Note (Signed)
The patient is under the care of a neurologist and pain medicine physician. Because of a history of  ulcers she is reluctant to take NSAIDs.  Recommendations #1 trial of Arthrotec when necessary for headaches

## 2011-10-20 NOTE — Progress Notes (Signed)
History of Present Illness:  Caitlin Irwin has returned following esophageal dilatation and upper endoscopy. Dysphagia has entirely subsided. At this time she has no other GI complaints.  Her main problem is migraine headaches. She has seen neurology for this. She is afraid to take NSAIDs because of a history of ulcers.    Review of Systems: Pertinent positive and negative review of systems were noted in the above HPI section. All other review of systems were otherwise negative.    Current Medications, Allergies, Past Medical History, Past Surgical History, Family History and Social History were reviewed in [employer] electronic medical record  Vital signs were reviewed in today's medical record. Physical Exam: General: Well developed , well nourished, no acute distress

## 2011-10-20 NOTE — Assessment & Plan Note (Signed)
Resolved following esophageal dilatation. 

## 2011-11-07 DIAGNOSIS — G500 Trigeminal neuralgia: Secondary | ICD-10-CM | POA: Insufficient documentation

## 2011-11-18 ENCOUNTER — Other Ambulatory Visit: Payer: Self-pay | Admitting: Internal Medicine

## 2011-11-18 DIAGNOSIS — M8580 Other specified disorders of bone density and structure, unspecified site: Secondary | ICD-10-CM

## 2011-11-21 ENCOUNTER — Ambulatory Visit
Admission: RE | Admit: 2011-11-21 | Discharge: 2011-11-21 | Disposition: A | Discharge status: Home / Self Care | Payer: Medicare Other | Source: Ambulatory Visit | Attending: Internal Medicine | Admitting: Internal Medicine

## 2011-11-21 ENCOUNTER — Ambulatory Visit (INDEPENDENT_AMBULATORY_CARE_PROVIDER_SITE_OTHER): Payer: No Typology Code available for payment source | Admitting: Licensed Clinical Social Worker

## 2011-11-21 DIAGNOSIS — F331 Major depressive disorder, recurrent, moderate: Secondary | ICD-10-CM

## 2011-11-21 DIAGNOSIS — M8580 Other specified disorders of bone density and structure, unspecified site: Secondary | ICD-10-CM

## 2011-11-25 ENCOUNTER — Telehealth: Payer: Self-pay | Admitting: Gastroenterology

## 2011-11-25 MED ORDER — PANTOPRAZOLE SODIUM 40 MG PO TBEC: 40.0000 mg | DELAYED_RELEASE_TABLET | Freq: Two times a day (BID) | ORAL | Status: DC

## 2011-11-25 NOTE — Telephone Encounter (Signed)
Called patient to inform that Protonix was sent in for twice a day  Dr Arlyce Dice, Patient wants to know if she can take one Protonix in the AM and one Nexium in the PM

## 2011-11-25 NOTE — Telephone Encounter (Signed)
Ok

## 2011-12-19 ENCOUNTER — Ambulatory Visit: Payer: No Typology Code available for payment source | Admitting: Licensed Clinical Social Worker

## 2011-12-27 HISTORY — PX: OTHER SURGICAL HISTORY: SHX169

## 2012-06-01 ENCOUNTER — Encounter (HOSPITAL_COMMUNITY): Payer: Self-pay | Admitting: Pharmacy Technician

## 2012-06-04 ENCOUNTER — Encounter (HOSPITAL_COMMUNITY): Payer: Self-pay | Admitting: *Deleted

## 2012-06-11 ENCOUNTER — Other Ambulatory Visit: Payer: Self-pay | Admitting: Gastroenterology

## 2012-06-12 ENCOUNTER — Encounter (HOSPITAL_COMMUNITY): Payer: Self-pay | Admitting: Anesthesiology

## 2012-06-12 ENCOUNTER — Ambulatory Visit (HOSPITAL_COMMUNITY)
Admission: RE | Admit: 2012-06-12 | Discharge: 2012-06-12 | Disposition: A | Discharge status: Home / Self Care | Payer: Medicare Other | Source: Ambulatory Visit | Attending: Gastroenterology | Admitting: Gastroenterology

## 2012-06-12 ENCOUNTER — Ambulatory Visit (HOSPITAL_COMMUNITY): Payer: Medicare Other | Admitting: Anesthesiology

## 2012-06-12 ENCOUNTER — Encounter (HOSPITAL_COMMUNITY)
Admission: RE | Disposition: A | Discharge status: Home / Self Care | Payer: Self-pay | Source: Ambulatory Visit | Attending: Gastroenterology

## 2012-06-12 ENCOUNTER — Encounter (HOSPITAL_COMMUNITY): Payer: Self-pay | Admitting: *Deleted

## 2012-06-12 DIAGNOSIS — R51 Headache: Secondary | ICD-10-CM | POA: Insufficient documentation

## 2012-06-12 DIAGNOSIS — Z8711 Personal history of peptic ulcer disease: Secondary | ICD-10-CM | POA: Insufficient documentation

## 2012-06-12 DIAGNOSIS — R12 Heartburn: Secondary | ICD-10-CM | POA: Insufficient documentation

## 2012-06-12 DIAGNOSIS — K319 Disease of stomach and duodenum, unspecified: Secondary | ICD-10-CM | POA: Insufficient documentation

## 2012-06-12 DIAGNOSIS — K2970 Gastritis, unspecified, without bleeding: Secondary | ICD-10-CM | POA: Insufficient documentation

## 2012-06-12 DIAGNOSIS — K219 Gastro-esophageal reflux disease without esophagitis: Secondary | ICD-10-CM

## 2012-06-12 DIAGNOSIS — K259 Gastric ulcer, unspecified as acute or chronic, without hemorrhage or perforation: Secondary | ICD-10-CM | POA: Insufficient documentation

## 2012-06-12 HISTORY — PX: ESOPHAGOGASTRODUODENOSCOPY: SHX5428

## 2012-06-12 HISTORY — DX: Other specified postprocedural states: Z98.890

## 2012-06-12 HISTORY — DX: Personal history of other diseases of the digestive system: Z87.19

## 2012-06-12 HISTORY — DX: Trigeminal neuralgia: G50.0

## 2012-06-12 HISTORY — DX: Nausea with vomiting, unspecified: R11.2

## 2012-06-12 SURGERY — EGD (ESOPHAGOGASTRODUODENOSCOPY)
Anesthesia: General

## 2012-06-12 MED ORDER — SODIUM CHLORIDE 0.9 % IV SOLN: INTRAVENOUS | Status: DC

## 2012-06-12 MED ORDER — MIDAZOLAM HCL 5 MG/5ML IJ SOLN
INTRAMUSCULAR | Status: DC | PRN
  Administered 2012-06-12: 2 mg via INTRAVENOUS

## 2012-06-12 MED ORDER — LACTATED RINGERS IV SOLN
INTRAVENOUS | Status: DC
  Administered 2012-06-12: 09:00:00 via INTRAVENOUS

## 2012-06-12 MED ORDER — PROPOFOL 10 MG/ML IV EMUL
INTRAVENOUS | Status: DC | PRN
  Administered 2012-06-12: 50 ug/kg/min via INTRAVENOUS

## 2012-06-12 MED ORDER — KETAMINE HCL 50 MG/ML IJ SOLN
INTRAMUSCULAR | Status: DC | PRN
  Administered 2012-06-12: 50 mg via INTRAMUSCULAR

## 2012-06-12 MED ORDER — FENTANYL CITRATE 0.05 MG/ML IJ SOLN
INTRAMUSCULAR | Status: DC | PRN
  Administered 2012-06-12: 50 ug via INTRAVENOUS

## 2012-06-12 MED ORDER — PROMETHAZINE HCL 25 MG/ML IJ SOLN: 6.2500 mg | INTRAMUSCULAR | Status: DC | PRN

## 2012-06-12 NOTE — Op Note (Signed)
[facility] [address] [place], [address]   ENDOSCOPY PROCEDURE REPORT  PATIENT: Caitlin Irwin, Caitlin Irwin  MR#: [id number] BIRTHDATE: July 06, 1951 , 61  yrs. old GENDER: Female  ENDOSCOPIST: Charlott Rakes, MD REFERRED XB:JYNWG Chilton Si, M.D.  PROCEDURE DATE:  06/12/2012 PROCEDURE:   EGD w/ biopsy ASA CLASS:   Class II INDICATIONS:heartburn.   history of GERD.   personal history of peptic ulcer disease. MEDICATIONS: See Anesthesia Report and Cetacaine spray x 2  TOPICAL ANESTHETIC:  DESCRIPTION OF PROCEDURE:   After the risks benefits and alternatives of the procedure were thoroughly explained, informed consent was obtained.  The Pentax Gastroscope [id number]  endoscope was introduced through the mouth and advanced to the second portion of the duodenum , limited by Without limitations.   The instrument was slowly withdrawn as the mucosa was fully examined.     FINDINGS: The endoscope was inserted into the oropharynx and esophagus was intubated.  The gastroesophageal junction was noted to be 35 cm from the incisors.  Endoscope was advanced into the stomach, which revealed erythema in the antrum and prepyloric channel and a linear superficial ulcer in the antrum that was approximately 1 cm long and 1mm wide.  The endoscope was advanced to the duodenal bulb and second portion of duodenum which were unremarkable.  The endoscope was withdrawn back into the stomach and retroflexion revealed a normal proximal stomach. Biopsies were taken of the distal stomach for histology. At the [facility] was scattered white exudate concerning for Candida esophagitis and esophageal brushing was taken to check for that.  COMPLICATIONS: None  ENDOSCOPIC IMPRESSION:     1. Antral gastritis 2. Antral ulcer likely NSAID-related 3. Questionable Candida esophagitis  RECOMMENDATIONS: Continue PPI QD; Avoid NSAIDs; F/U on path   REPEAT EXAM: N/A  _______________________________ Charlott Rakes, MD eSigned:  Charlott Rakes, MD 06/12/2012 9:55 AM    CC:  PATIENT NAME:  Caitlin Irwin MR#: [id number]

## 2012-06-12 NOTE — Anesthesia Preprocedure Evaluation (Addendum)
Anesthesia Evaluation  Patient identified by MRN, date of birth, ID band Patient awake    Reviewed: Allergy & Precautions, H&P , NPO status , Patient's Chart, lab work & pertinent test results  History of Anesthesia Complications (+) PONV  Airway Mallampati: II TM Distance: >3 FB Neck ROM: Full    Dental  (+) Teeth Intact and Dental Advisory Given   Pulmonary neg pulmonary ROS,  breath sounds clear to auscultation  Pulmonary exam normal       Cardiovascular negative cardio ROS  Rhythm:Regular Rate:Normal     Neuro/Psych  Headaches, Anxiety Depression  Neuromuscular disease    GI/Hepatic Neg liver ROS, hiatal hernia, PUD, GERD-  ,  Endo/Other  Hypothyroidism   Renal/GU Renal disease  negative genitourinary   Musculoskeletal negative musculoskeletal ROS (+)   Abdominal   Peds negative pediatric ROS (+)  Hematology negative hematology ROS (+)   Anesthesia Other Findings   Reproductive/Obstetrics negative OB ROS                          Anesthesia Physical Anesthesia Plan  ASA: II  Anesthesia Plan: General   Post-op Pain Management:    Induction: Intravenous  Airway Management Planned: Nasal Cannula  Additional Equipment:   Intra-op Plan:   Post-operative Plan:   Informed Consent: I have reviewed the patients History and Physical, chart, labs and discussed the procedure including the risks, benefits and alternatives for the proposed anesthesia with the patient or authorized representative who has indicated his/her understanding and acceptance.   Dental advisory given  Plan Discussed with: CRNA  Anesthesia Plan Comments:         Anesthesia Quick Evaluation

## 2012-06-12 NOTE — Interval H&P Note (Signed)
History and Physical Interval Note:  06/12/2012 9:24 AM  Caitlin Irwin  has presented today for surgery, with the diagnosis of reflux  The various methods of treatment have been discussed with the patient and family. After consideration of risks, benefits and other options for treatment, the patient has consented to  Procedure(s) (LRB) with comments: ESOPHAGOGASTRODUODENOSCOPY (EGD) (N/A) as a surgical intervention .  The patient's history has been reviewed, patient examined, no change in status, stable for surgery.  I have reviewed the patient's chart and labs.  Questions were answered to the patient's satisfaction.     [name]

## 2012-06-12 NOTE — Transfer of Care (Signed)
Immediate Anesthesia Transfer of Care Note  Patient: Caitlin Irwin  Procedure(s) Performed: Procedure(s) (LRB) with comments: ESOPHAGOGASTRODUODENOSCOPY (EGD) (N/A)  Patient Location: PACU  Anesthesia Type:MAC  Level of Consciousness: sedated  Airway & Oxygen Therapy: Patient Spontanous Breathing and Patient connected to nasal cannula oxygen  Post-op Assessment: Report given to PACU RN and Post -op Vital signs reviewed and stable  Post vital signs: Reviewed and stable  Complications: No apparent anesthesia complications

## 2012-06-12 NOTE — H&P (Signed)
  Date of Initial H&P: 06/06/12  History reviewed, patient examined, no change in status, stable for surgery. EGD due to GERD and history of peptic ulcer disease. Takes 2 Aleves daily for headaches.

## 2012-06-12 NOTE — Anesthesia Postprocedure Evaluation (Signed)
Anesthesia Post Note  Patient: Caitlin Irwin  Procedure(s) Performed: Procedure(s) (LRB): ESOPHAGOGASTRODUODENOSCOPY (EGD) (N/A)  Anesthesia type: MAC  Patient location: PACU  Post pain: Pain level controlled  Post assessment: Post-op Vital signs reviewed  Last Vitals:  Filed Vitals:   06/12/12 1030  BP: 122/72  Temp:   Resp: 16    Post vital signs: Reviewed  Level of consciousness: sedated  Complications: No apparent anesthesia complications

## 2012-06-13 ENCOUNTER — Encounter (HOSPITAL_COMMUNITY): Payer: Self-pay | Admitting: Gastroenterology

## 2012-09-15 ENCOUNTER — Other Ambulatory Visit: Payer: Self-pay

## 2012-12-03 ENCOUNTER — Telehealth: Payer: Self-pay | Admitting: Neurology

## 2012-12-03 NOTE — Telephone Encounter (Signed)
I have faxed everything to the insurance 3 times.  I called them and they said it is still pending a decision form the medical review board.  I called patient, got no answer.

## 2012-12-04 ENCOUNTER — Other Ambulatory Visit: Payer: Self-pay | Admitting: Otolaryngology

## 2012-12-04 NOTE — Telephone Encounter (Signed)
Spoke with [place] from [employer].  She said the Prior Auth for Sumatriptan QL has been approved effective 12/04/2012-12/04/2013.  She said they have notified the patient and will mail [place] the approval letter.

## 2012-12-06 ENCOUNTER — Other Ambulatory Visit: Payer: Self-pay | Admitting: Neurology

## 2012-12-14 ENCOUNTER — Telehealth: Payer: Self-pay

## 2012-12-14 NOTE — Telephone Encounter (Signed)
klonipin , I  want her to stay on 1/2 in AM and 2 at night - no increase.

## 2012-12-14 NOTE — Telephone Encounter (Signed)
Called patient back.  Got no answer.  Left message.

## 2012-12-14 NOTE — Telephone Encounter (Signed)
Patient called clinic and left message saying that she got her rx for Klonopin and it was for 1/2 in am and 2 hs.  This is how we have been prescribing it since May 03, 2012 according to [employer].  Patient says she has been taking 1 in am and 2 hs because she thought that is what her dose was for.  She would like a new rx for 1 in AM and 2 HS.  Please advise.  Thank you.

## 2012-12-24 ENCOUNTER — Other Ambulatory Visit: Payer: Self-pay | Admitting: Neurology

## 2012-12-28 ENCOUNTER — Other Ambulatory Visit: Payer: Self-pay | Admitting: Gastroenterology

## 2013-01-06 ENCOUNTER — Other Ambulatory Visit: Payer: Self-pay | Admitting: Neurology

## 2013-01-09 ENCOUNTER — Emergency Department (HOSPITAL_COMMUNITY)
Admission: EM | Admit: 2013-01-09 | Discharge: 2013-01-09 | Disposition: A | Discharge status: Home / Self Care | Payer: Medicare Other | Attending: Emergency Medicine | Admitting: Emergency Medicine

## 2013-01-09 ENCOUNTER — Emergency Department (HOSPITAL_COMMUNITY): Payer: Medicare Other

## 2013-01-09 ENCOUNTER — Encounter (HOSPITAL_COMMUNITY): Payer: Self-pay | Admitting: *Deleted

## 2013-01-09 DIAGNOSIS — N189 Chronic kidney disease, unspecified: Secondary | ICD-10-CM | POA: Insufficient documentation

## 2013-01-09 DIAGNOSIS — Z8669 Personal history of other diseases of the nervous system and sense organs: Secondary | ICD-10-CM | POA: Insufficient documentation

## 2013-01-09 DIAGNOSIS — Z862 Personal history of diseases of the blood and blood-forming organs and certain disorders involving the immune mechanism: Secondary | ICD-10-CM | POA: Insufficient documentation

## 2013-01-09 DIAGNOSIS — Z79899 Other long term (current) drug therapy: Secondary | ICD-10-CM | POA: Insufficient documentation

## 2013-01-09 DIAGNOSIS — Z8739 Personal history of other diseases of the musculoskeletal system and connective tissue: Secondary | ICD-10-CM | POA: Insufficient documentation

## 2013-01-09 DIAGNOSIS — W108XXA Fall (on) (from) other stairs and steps, initial encounter: Secondary | ICD-10-CM | POA: Insufficient documentation

## 2013-01-09 DIAGNOSIS — Y929 Unspecified place or not applicable: Secondary | ICD-10-CM | POA: Insufficient documentation

## 2013-01-09 DIAGNOSIS — K219 Gastro-esophageal reflux disease without esophagitis: Secondary | ICD-10-CM | POA: Insufficient documentation

## 2013-01-09 DIAGNOSIS — Z8719 Personal history of other diseases of the digestive system: Secondary | ICD-10-CM | POA: Insufficient documentation

## 2013-01-09 DIAGNOSIS — W19XXXA Unspecified fall, initial encounter: Secondary | ICD-10-CM

## 2013-01-09 DIAGNOSIS — Z8711 Personal history of peptic ulcer disease: Secondary | ICD-10-CM | POA: Insufficient documentation

## 2013-01-09 DIAGNOSIS — F329 Major depressive disorder, single episode, unspecified: Secondary | ICD-10-CM | POA: Insufficient documentation

## 2013-01-09 DIAGNOSIS — G8929 Other chronic pain: Secondary | ICD-10-CM | POA: Insufficient documentation

## 2013-01-09 DIAGNOSIS — F411 Generalized anxiety disorder: Secondary | ICD-10-CM | POA: Insufficient documentation

## 2013-01-09 DIAGNOSIS — Z8679 Personal history of other diseases of the circulatory system: Secondary | ICD-10-CM | POA: Insufficient documentation

## 2013-01-09 DIAGNOSIS — R42 Dizziness and giddiness: Secondary | ICD-10-CM | POA: Insufficient documentation

## 2013-01-09 DIAGNOSIS — Y939 Activity, unspecified: Secondary | ICD-10-CM | POA: Insufficient documentation

## 2013-01-09 DIAGNOSIS — E039 Hypothyroidism, unspecified: Secondary | ICD-10-CM | POA: Insufficient documentation

## 2013-01-09 DIAGNOSIS — S0990XA Unspecified injury of head, initial encounter: Secondary | ICD-10-CM | POA: Insufficient documentation

## 2013-01-09 DIAGNOSIS — F3289 Other specified depressive episodes: Secondary | ICD-10-CM | POA: Insufficient documentation

## 2013-01-09 NOTE — ED Provider Notes (Signed)
History     CSN: [id number]  Arrival date & time 01/09/13  1210   First MD Initiated Contact with Patient 01/09/13 1626      Chief Complaint  Patient presents with  . Fall  . Head Injury    (Consider location/radiation/quality/duration/timing/severity/associated sxs/prior treatment) HPI Patient presents 4 days after a series of falls with ongoing headache, neck pain, nausea, mild disequilibrium. Patient has a notable history of chronic pain, as well as prior craniotomy with trigeminal neuralgia revision performed last December. She states that both 5 days ago and 4 days ago she had a mechanical fall, with head trauma each time.  Since that time she had the aforementioned complaints.  There is diffuse pain. Visual complaints are double vision intermittently. No nausea, vomiting, incontinence, unilateral weakness, confusion, dislocation, dysphagia, dysphagia.   Past Medical History  Diagnosis Date  . Migraines   . Other constipation   . Unspecified hypothyroidism   . IBS (irritable bowel syndrome)   . PUD (peptic ulcer disease)   . Chronic pain   . Parotid tumor   . Anemia   . Anxiety   . Blood transfusion   . Cataract   . GERD (gastroesophageal reflux disease)   . Osteoporosis   . H/O hiatal hernia   . Trigeminal neuralgia   . S/P radiation therapy   . PONV (postoperative nausea and vomiting)     hx of " getting too much" - 1998, slow to take up by body   . Chronic kidney disease     hx of uti   . Depression     Past Surgical History  Procedure Laterality Date  . Cholecystectomy    . Abdominal hysterectomy    . Right knee surgery    . L5 surgery    . Heary surgery      correct hole in the heart  . Left ankle    . Back surgery    . Left shoulder surgery     . Cut muscles and nerves in head       to stop migraines and it did not work   . Heart outpaitent surgery       to correct hole in heart   . Brain surgery      parotid tumor removed   .  Esophagogastroduodenoscopy  06/12/2012    Procedure: ESOPHAGOGASTRODUODENOSCOPY (EGD);  Surgeon: Shirley Friar, MD;  Location: [facility];  Service: Endoscopy;  Laterality: N/A;    Family History  Problem Relation Age of Onset  . Ovarian cancer Mother   . Heart disease Maternal Grandfather     History  Substance Use Topics  . Smoking status: Never Smoker   . Smokeless tobacco: Never Used  . Alcohol Use: No    OB History   Grav Para Term Preterm Abortions TAB SAB Ect Mult Living                  Review of Systems  Constitutional:       Per HPI, otherwise negative  HENT:       Per HPI, otherwise negative  Respiratory:       Per HPI, otherwise negative  Cardiovascular:       Per HPI, otherwise negative  Gastrointestinal: Negative for vomiting.  Endocrine:       Negative aside from HPI  Genitourinary:       Neg aside from HPI   Musculoskeletal:       Per HPI, otherwise negative  Skin: Negative.   Neurological: Positive for dizziness, light-headedness and headaches. Negative for seizures, syncope, facial asymmetry, speech difficulty, weakness and numbness.    Allergies  Acetaminophen  Home Medications   Current Outpatient Rx  Name  Route  Sig  Dispense  Refill  . clonazePAM (KLONOPIN) 1 MG tablet      1/2 tablet in the morning and 2 tablets in the evening (No Early Refills)   75 tablet   5     Pharmacy Fax [phone]   . estrogens, conjugated, (PREMARIN) 0.625 MG tablet   Oral   Take 0.625 mg by mouth daily. Take daily for 21 days then do not take for 7 days.         Marland Kitchen FLUoxetine (PROZAC) 20 MG capsule   Oral   Take 20 mg by mouth daily before breakfast.         . levothyroxine (SYNTHROID, LEVOTHROID) 75 MCG tablet   Oral   Take 75 mcg by mouth daily before breakfast.          . methadone (DOLOPHINE) 5 MG tablet   Oral   Take 5 mg by mouth every evening.         . ondansetron (ZOFRAN) 4 MG tablet   Oral   Take 4 mg by mouth as  needed. nausea         . pantoprazole (PROTONIX) 40 MG tablet   Oral   Take 40 mg by mouth 2 (two) times daily.          Marland Kitchen spironolactone (ALDACTONE) 100 MG tablet   Oral   Take 100 mg by mouth daily before breakfast.          . SUMAtriptan (IMITREX) 100 MG tablet   Oral   Take 100 mg by mouth as needed. migraine         . valACYclovir (VALTREX) 500 MG tablet   Oral   Take 500 mg by mouth daily.           BP 120/74  Pulse 81  Temp(Src) 97.4 F (36.3 C) (Oral)  Resp 18  SpO2 98%  Physical Exam  Nursing note and vitals reviewed. Constitutional: She is oriented to person, place, and time. She appears well-developed and well-nourished. No distress.  HENT:  Head: Normocephalic.  No TMJ tenderness, no malocclusion, there is mild left periorbital ecchymosis, without deformity, without tenderness to palpation.   Eyes: Conjunctivae and EOM are normal.  Neck:  Range of motion is limited secondary to pain in the midline with left lateral rotation.  There are no deformities, no tenderness to palpation.   Cardiovascular: Normal rate and regular rhythm.   Pulmonary/Chest: Effort normal and breath sounds normal. No stridor. No respiratory distress.  Abdominal: She exhibits no distension.  Musculoskeletal: She exhibits no edema.  Neurological: She is alert and oriented to person, place, and time. She displays no atrophy and no tremor. No cranial nerve deficit. She exhibits normal muscle tone. She displays no seizure activity. Coordination normal.  Skin: Skin is warm and dry.  Psychiatric: She has a normal mood and affect.    ED Course  Procedures (including critical care time)  Labs Reviewed - No data to display Ct Head Wo Contrast  01/09/2013   *RADIOLOGY REPORT*  Clinical Data:  Falls 3 and 4 days ago with trauma to head both times.  Persistent headache.  Diplopia.  Nausea and confusion.  Off balance.  CT HEAD WITHOUT CONTRAST CT CERVICAL SPINE WITHOUT CONTRAST  Technique:  Multidetector CT imaging of the head and cervical spine was performed following the standard protocol without intravenous contrast.  Multiplanar CT image reconstructions of the cervical spine were also generated.  Comparison:  CT head without contrast 11/25/2009.  MRI brain 11/29/2009.  CT HEAD  Findings: The patient is status post left axilla craniectomy since the prior exam.  The posterior mastoids were breech to, adjacent to the left sigmoid sinus.  There is high-density material at the medial and inferior aspect of the surgical margin which slightly displaces the cerebellum.  Mild generalized atrophy is present.  No acute cortical infarct, hemorrhage, mass lesion is present.  The ventricles are proportionate to the degree of atrophy.  No significant extra-axial fluid collection is present.  The paranasal sinuses and mastoid air cells are clear.  IMPRESSION:  1.  Postoperative changes of a left occipital craniectomy and posterior mastoid resection. 2.  High-density material along the inferior and medial portion of the craniectomy slightly displaces the cerebellum.  This is most likely surgical material.  Please correlate with operative history. No operative note in EPIC corresponds to the surgery since 2011. 3.  No acute intracranial abnormality. 4.  Mild generalized atrophy.  CT CERVICAL SPINE  Findings: The cervical spine is imaged from the skull base through T1-2.  Slight degenerative anterolisthesis is noted at C3-4, C4-5, and C5-6.  There is loss of disc height with chronic end plate change at C6-7.  No acute fracture or traumatic subluxation is evident.  Multilevel facet degenerative changes are noted.  The lung apices are clear.  The soft tissues are unremarkable.  IMPRESSION:  1.  Multilevel spondylosis of the cervical spine without evidence for acute fracture or traumatic subluxation.   Original Report Authenticated By: Marin Roberts, M.D.   Ct Cervical Spine Wo Contrast  01/09/2013    *RADIOLOGY REPORT*  Clinical Data:  Falls 3 and 4 days ago with trauma to head both times.  Persistent headache.  Diplopia.  Nausea and confusion.  Off balance.  CT HEAD WITHOUT CONTRAST CT CERVICAL SPINE WITHOUT CONTRAST  Technique:  Multidetector CT imaging of the head and cervical spine was performed following the standard protocol without intravenous contrast.  Multiplanar CT image reconstructions of the cervical spine were also generated.  Comparison:  CT head without contrast 11/25/2009.  MRI brain 11/29/2009.  CT HEAD  Findings: The patient is status post left axilla craniectomy since the prior exam.  The posterior mastoids were breech to, adjacent to the left sigmoid sinus.  There is high-density material at the medial and inferior aspect of the surgical margin which slightly displaces the cerebellum.  Mild generalized atrophy is present.  No acute cortical infarct, hemorrhage, mass lesion is present.  The ventricles are proportionate to the degree of atrophy.  No significant extra-axial fluid collection is present.  The paranasal sinuses and mastoid air cells are clear.  IMPRESSION:  1.  Postoperative changes of a left occipital craniectomy and posterior mastoid resection. 2.  High-density material along the inferior and medial portion of the craniectomy slightly displaces the cerebellum.  This is most likely surgical material.  Please correlate with operative history. No operative note in EPIC corresponds to the surgery since 2011. 3.  No acute intracranial abnormality. 4.  Mild generalized atrophy.  CT CERVICAL SPINE  Findings: The cervical spine is imaged from the skull base through T1-2.  Slight degenerative anterolisthesis is noted at C3-4, C4-5, and C5-6.  There is loss of disc height with chronic  end plate change at C6-7.  No acute fracture or traumatic subluxation is evident.  Multilevel facet degenerative changes are noted.  The lung apices are clear.  The soft tissues are unremarkable.   IMPRESSION:  1.  Multilevel spondylosis of the cervical spine without evidence for acute fracture or traumatic subluxation.   Original Report Authenticated By: Marin Roberts, M.D.     1. Fall, initial encounter     After the initial review the patient's CT scan, interpreted the myself.  Subsequently a review of patient's charts from her other hospital , including neurosurgical notes.  Following this I discussed the patient's imaging today with our radiologist.  There appears to be no acute findings, no findings consistent with recent surgical intervention.   Update: I informed the patient and her husband of findings as far.  I discussed MRI for further evaluation versus discharge with close followup.  They elected this option. MDM  Patient presents several days after a series of falls with ongoing headache pain, occasional double vision, mild nausea.  Given the patient's surgical history, neck pain, radiographic imaging was provided.  Imaging was consistent with surgical repair, with no acute bleeding or fracture.  After discussion on return precautions, follow up instructions the patient was discharged in stable condition.        Gerhard Munch, MD 01/09/13 [phone]

## 2013-01-09 NOTE — ED Notes (Signed)
Pt states fell on Saturday and Sunday going down stairs and hit head both times.  NO LOC.  Pt has had headache since falling.  Pt reports double vision, nausea, confusion, off-balance with walking.  PT is not on any blood thinners

## 2013-01-16 ENCOUNTER — Telehealth: Payer: Self-pay | Admitting: *Deleted

## 2013-01-16 NOTE — Telephone Encounter (Signed)
Message copied by Monico Blitz on Wed Jan 16, 2013  1:46 PM ------      Message from: [facility], [place]      Created: Wed Jan 16, 2013 12:43 PM      Contact: pt       Pt fell down the stairs twice a week ago.  She has a concussion.  She wants to know how long she will have a headache?  She has questions about her medication as well.  She is a [facility] patient. ------

## 2013-01-17 ENCOUNTER — Emergency Department (HOSPITAL_COMMUNITY)
Admission: EM | Admit: 2013-01-17 | Discharge: 2013-01-17 | Disposition: A | Discharge status: Home / Self Care | Payer: Medicare Other | Attending: Emergency Medicine | Admitting: Emergency Medicine

## 2013-01-17 ENCOUNTER — Encounter (HOSPITAL_COMMUNITY): Payer: Self-pay

## 2013-01-17 DIAGNOSIS — Z79899 Other long term (current) drug therapy: Secondary | ICD-10-CM | POA: Insufficient documentation

## 2013-01-17 DIAGNOSIS — Z8669 Personal history of other diseases of the nervous system and sense organs: Secondary | ICD-10-CM | POA: Insufficient documentation

## 2013-01-17 DIAGNOSIS — R630 Anorexia: Secondary | ICD-10-CM | POA: Insufficient documentation

## 2013-01-17 DIAGNOSIS — H532 Diplopia: Secondary | ICD-10-CM | POA: Insufficient documentation

## 2013-01-17 DIAGNOSIS — Z8739 Personal history of other diseases of the musculoskeletal system and connective tissue: Secondary | ICD-10-CM | POA: Insufficient documentation

## 2013-01-17 DIAGNOSIS — Z9889 Other specified postprocedural states: Secondary | ICD-10-CM | POA: Insufficient documentation

## 2013-01-17 DIAGNOSIS — R51 Headache: Secondary | ICD-10-CM | POA: Insufficient documentation

## 2013-01-17 DIAGNOSIS — Z87440 Personal history of urinary (tract) infections: Secondary | ICD-10-CM | POA: Insufficient documentation

## 2013-01-17 DIAGNOSIS — N189 Chronic kidney disease, unspecified: Secondary | ICD-10-CM | POA: Insufficient documentation

## 2013-01-17 DIAGNOSIS — F0781 Postconcussional syndrome: Secondary | ICD-10-CM | POA: Insufficient documentation

## 2013-01-17 DIAGNOSIS — Y9301 Activity, walking, marching and hiking: Secondary | ICD-10-CM | POA: Insufficient documentation

## 2013-01-17 DIAGNOSIS — G43909 Migraine, unspecified, not intractable, without status migrainosus: Secondary | ICD-10-CM | POA: Insufficient documentation

## 2013-01-17 DIAGNOSIS — K219 Gastro-esophageal reflux disease without esophagitis: Secondary | ICD-10-CM | POA: Insufficient documentation

## 2013-01-17 DIAGNOSIS — Z8719 Personal history of other diseases of the digestive system: Secondary | ICD-10-CM | POA: Insufficient documentation

## 2013-01-17 DIAGNOSIS — H538 Other visual disturbances: Secondary | ICD-10-CM | POA: Insufficient documentation

## 2013-01-17 DIAGNOSIS — R110 Nausea: Secondary | ICD-10-CM | POA: Insufficient documentation

## 2013-01-17 DIAGNOSIS — Z923 Personal history of irradiation: Secondary | ICD-10-CM | POA: Insufficient documentation

## 2013-01-17 DIAGNOSIS — Z8711 Personal history of peptic ulcer disease: Secondary | ICD-10-CM | POA: Insufficient documentation

## 2013-01-17 DIAGNOSIS — G8929 Other chronic pain: Secondary | ICD-10-CM | POA: Insufficient documentation

## 2013-01-17 DIAGNOSIS — F411 Generalized anxiety disorder: Secondary | ICD-10-CM | POA: Insufficient documentation

## 2013-01-17 DIAGNOSIS — Y9289 Other specified places as the place of occurrence of the external cause: Secondary | ICD-10-CM | POA: Insufficient documentation

## 2013-01-17 DIAGNOSIS — R296 Repeated falls: Secondary | ICD-10-CM | POA: Insufficient documentation

## 2013-01-17 DIAGNOSIS — Z862 Personal history of diseases of the blood and blood-forming organs and certain disorders involving the immune mechanism: Secondary | ICD-10-CM | POA: Insufficient documentation

## 2013-01-17 DIAGNOSIS — R262 Difficulty in walking, not elsewhere classified: Secondary | ICD-10-CM | POA: Insufficient documentation

## 2013-01-17 NOTE — ED Provider Notes (Signed)
History     CSN: [id number]  Arrival date & time 01/17/13  1636   First MD Initiated Contact with Patient 01/17/13 1639      No chief complaint on file.   (Consider location/radiation/quality/duration/timing/severity/associated sxs/prior treatment) HPI Comments: Caitlin Irwin is a 62 y.o. female who is here for evaluation of a headache that occurred after a fall. She fell twice 2 weeks ago. 11 days ago she was evaluated in the ED for the same injury. She has not had any other falls, since then. She has ongoing headache, nausea, decreased appetite, double vision, blurred vision, and trouble walking. The symptoms are persistent, and ongoing, with the exception of a headache, which improves when she takes Imitrex. She has taken Imitrex twice a day for the last week. She has chronic pain and uses methadone twice a day. The methadone also seems to help her pain. She is on a pain contract with Caitlin Irwin. There no fever, chills, change in bowel or urinary habits. The patient wonders if an MRI, with contrast, of her hand, will help to guide her further. There are no other modifying factors.  The history is provided by the patient.    Past Medical History  Diagnosis Date  . Migraines   . Other constipation   . Unspecified hypothyroidism   . IBS (irritable bowel syndrome)   . PUD (peptic ulcer disease)   . Chronic pain   . Parotid tumor   . Anemia   . Anxiety   . Blood transfusion   . Cataract   . GERD (gastroesophageal reflux disease)   . Osteoporosis   . H/O hiatal hernia   . Trigeminal neuralgia   . S/P radiation therapy   . PONV (postoperative nausea and vomiting)     hx of " getting too much" - 1998, slow to take up by body   . Chronic kidney disease     hx of uti   . Depression     Past Surgical History  Procedure Laterality Date  . Cholecystectomy    . Abdominal hysterectomy    . Right knee surgery    . L5 surgery    . Heary surgery      correct hole in the heart   . Left ankle    . Back surgery    . Left shoulder surgery     . Cut muscles and nerves in head       to stop migraines and it did not work   . Heart outpaitent surgery       to correct hole in heart   . Brain surgery      parotid tumor removed   . Esophagogastroduodenoscopy  06/12/2012    Procedure: ESOPHAGOGASTRODUODENOSCOPY (EGD);  Surgeon: Shirley Friar, MD;  Location: [facility];  Service: Endoscopy;  Laterality: N/A;    Family History  Problem Relation Age of Onset  . Ovarian cancer Mother   . Heart disease Maternal Grandfather     History  Substance Use Topics  . Smoking status: Never Smoker   . Smokeless tobacco: Never Used  . Alcohol Use: No    OB History   Grav Para Term Preterm Abortions TAB SAB Ect Mult Living                  Review of Systems  All other systems reviewed and are negative.    Allergies  Acetaminophen  Home Medications   Current Outpatient Rx  Name  Route  Sig  Dispense  Refill  . clonazePAM (KLONOPIN) 1 MG tablet   Oral   Take 0.5-2 mg by mouth 2 (two) times daily as needed for anxiety. Takes 0.5mg  in the morning and 2mg  in the evening         . estrogens, conjugated, (PREMARIN) 0.625 MG tablet   Oral   Take 0.625 mg by mouth daily.          Marland Kitchen FIBER SELECT GUMMIES PO   Oral   Take 1 tablet by mouth at bedtime.         Marland Kitchen FLUoxetine (PROZAC) 20 MG capsule   Oral   Take 20 mg by mouth daily before breakfast.         . levothyroxine (SYNTHROID, LEVOTHROID) 75 MCG tablet   Oral   Take 75 mcg by mouth daily before breakfast.          . methadone (DOLOPHINE) 5 MG tablet   Oral   Take 5 mg by mouth 2 (two) times daily.          . Multiple Vitamins-Minerals (ADULT ONE DAILY GUMMIES PO)   Oral   Take 2 tablets by mouth daily.         . naproxen sodium (ANAPROX) 220 MG tablet   Oral   Take 440 mg by mouth 2 (two) times daily as needed (for pain).         . ondansetron (ZOFRAN) 4 MG tablet   Oral    Take 4 mg by mouth as needed. nausea         . pantoprazole (PROTONIX) 40 MG tablet   Oral   Take 40 mg by mouth 2 (two) times daily.          Marland Kitchen spironolactone (ALDACTONE) 100 MG tablet   Oral   Take 100 mg by mouth daily before breakfast.          . SUMAtriptan (IMITREX) 100 MG tablet   Oral   Take 100 mg by mouth as needed. migraine         . valACYclovir (VALTREX) 500 MG tablet   Oral   Take 500 mg by mouth daily.         Marland Kitchen zolpidem (AMBIEN) 10 MG tablet   Oral   Take 10 mg by mouth at bedtime as needed for sleep.           BP 150/55  Pulse 64  Temp(Src) 98.5 F (36.9 C) (Oral)  Resp 18  SpO2 98%  Physical Exam  Nursing note and vitals reviewed. Constitutional: She is oriented to person, place, and time. She appears well-developed and well-nourished.  HENT:  Head: Normocephalic and atraumatic.  Eyes: Conjunctivae and EOM are normal. Pupils are equal, round, and reactive to light.  Neck: Normal range of motion and phonation normal. Neck supple.  Cardiovascular: Normal rate, regular rhythm and intact distal pulses.   Pulmonary/Chest: Effort normal and breath sounds normal. She exhibits no tenderness.  Abdominal: Soft. She exhibits no distension. There is no tenderness. There is no guarding.  Musculoskeletal: Normal range of motion.  Neurological: She is alert and oriented to person, place, and time. She has normal strength. No cranial nerve deficit. She exhibits normal muscle tone. Coordination normal.  Normal Romberg. Normal finger to nose, bilaterally. Normal heel-to-shin, bilaterally.  Skin: Skin is warm and dry.  Psychiatric: Her behavior is normal. Judgment and thought content normal.  Anxious    ED Course  Procedures (including critical care  time)   Case findings discussed with the patient, and her husband. They seem reassured, that her symptoms are related to a concussion.   1. Post concussion syndrome       MDM  Symptoms that are  typical for concussion persists in this patient after 2 separate head injuries, 2 weeks ago. She is already had a CT scan in the interim. I do not believe another CT scan to further diagnose her issue. I do not believe that an MRI, if indicated, at this time. She has no convincing symptoms for infectious process or metabolic abnormality. Doubt metabolic instability, serious bacterial infection or impending vascular collapse; the patient is stable for discharge.  Nursing Notes Reviewed/ Care Coordinated, and agree without changes. Applicable Imaging Reviewed.  Interpretation of Laboratory Data incorporated into ED treatment    Plan: Home Medications- usual; Home Treatments and Observation- rest; return here if the recommended treatment, does not improve the symptoms; Recommended follow up- Call Neurology for follow up appt. Of the conccusion.          Flint Melter, MD 01/18/13 [phone]

## 2013-01-17 NOTE — ED Notes (Signed)
Pt presents with continued headache and neck since falling x 2 last weekend.  Pt reports she missed the bottom step both times she fell, pt seen here on Wednesday with CT negative.  Pt reports OTC medication is not helping.  +double vision, nausea and vomiting.  Pt requesting "MRI with contrast".  Pt reports h/o migraines with this pain different.

## 2013-01-18 NOTE — Telephone Encounter (Signed)
Pt called back wanted to know if a nurse could call her she is concerned about her headaches and her falls that she continues to have.

## 2013-01-18 NOTE — Telephone Encounter (Signed)
I called pt and she has concussion from 2 separate falls. 01-02-13 and then again day later 01-03-13.  She went to ER and was seen again 01-09-13 and 01-17-13.  Xrays done.   No meds given.  Monitor.  She see Dr. Thyra Breed for pain management.   I told her to contact his office and I would send note to Dr. Vickey Huger.  Her reccs.  She is taking imitrex, may need refill.  Also aleve.   I told her that if problem over the w/e to call service or go to ER.

## 2013-01-19 ENCOUNTER — Telehealth: Payer: Self-pay | Admitting: Neurology

## 2013-01-19 NOTE — Telephone Encounter (Signed)
Nobody answered at the given phone number today, please note that I have gotten a note from the ED , but no call messages from [facility] triage. Thermon Zulauf, MD

## 2013-01-22 ENCOUNTER — Telehealth: Payer: Self-pay | Admitting: Neurology

## 2013-01-22 NOTE — Telephone Encounter (Signed)
i have tried to reach her on the weekend, d finding a phone message from Friday late .   Please make a RV with either carolyn or me, she sounds very much like a post-concussion syndrome.  Make it last of the day , it will take time.

## 2013-01-22 NOTE — Telephone Encounter (Signed)
Patient called wanting to speak with physician concerning her ED visit. Patient stated she still having problems with her vision such as blurred vision and she wants to speak with physician.

## 2013-01-23 ENCOUNTER — Telehealth: Payer: Self-pay | Admitting: Neurology

## 2013-01-23 NOTE — Telephone Encounter (Signed)
Please get a phone number that someone will answer under.   CD

## 2013-01-24 ENCOUNTER — Telehealth: Payer: Self-pay | Admitting: Neurology

## 2013-01-24 DIAGNOSIS — G44309 Post-traumatic headache, unspecified, not intractable: Secondary | ICD-10-CM

## 2013-01-24 MED ORDER — ONDANSETRON HCL 4 MG PO TABS: 4.0000 mg | ORAL_TABLET | ORAL | Status: DC | PRN

## 2013-01-24 NOTE — Telephone Encounter (Signed)
Concussion , negative CT for bleed, fracture. Zofran given by ED now out. , Dr Vear Clock called in Phenergan. I predict she will not will not recover quickly . Post traumatic  headaches.   She will sleep as miuch as possible . I refilled Zofran

## 2013-01-24 NOTE — Telephone Encounter (Signed)
Called the patient and she says she had the phone by her side for days please call this number [phone].

## 2013-01-25 ENCOUNTER — Ambulatory Visit
Admission: RE | Admit: 2013-01-25 | Discharge: 2013-01-25 | Disposition: A | Discharge status: Home / Self Care | Payer: Medicare Other | Source: Ambulatory Visit | Attending: Neurology | Admitting: Neurology

## 2013-01-25 DIAGNOSIS — G44309 Post-traumatic headache, unspecified, not intractable: Secondary | ICD-10-CM

## 2013-01-25 DIAGNOSIS — R51 Headache: Secondary | ICD-10-CM

## 2013-01-25 MED ORDER — GADOBENATE DIMEGLUMINE 529 MG/ML IV SOLN
10.0000 mL | Freq: Once | INTRAVENOUS | Status: AC | PRN
Start: 2013-01-25 — End: 2013-01-25
  Administered 2013-01-25: 10 mL via INTRAVENOUS

## 2013-01-28 ENCOUNTER — Telehealth: Payer: Self-pay | Admitting: Neurology

## 2013-01-28 ENCOUNTER — Other Ambulatory Visit: Payer: Self-pay | Admitting: Neurology

## 2013-01-28 DIAGNOSIS — F4323 Adjustment disorder with mixed anxiety and depressed mood: Secondary | ICD-10-CM

## 2013-01-28 MED ORDER — CLONAZEPAM 1 MG PO TABS: 1.0000 mg | ORAL_TABLET | Freq: Three times a day (TID) | ORAL | Status: DC | PRN

## 2013-01-28 NOTE — Telephone Encounter (Signed)
Spoke to patient and she is wanting to know why her Clonazepam was changed to half a tab instead a full tab. She is out of meds and her head is really hurting. She would like a phone call back because she states that this is a complicated and hard to explain.  Please advise.

## 2013-01-28 NOTE — Telephone Encounter (Signed)
Called the patient and no answer. Left her a detailed message on how she is suppose to take her Clonazepam. If she has any more questions she is to call the office back.

## 2013-01-29 NOTE — Progress Notes (Signed)
Quick Note:  Please call with stable MRI brain . ______

## 2013-01-30 ENCOUNTER — Telehealth: Payer: Self-pay | Admitting: Neurology

## 2013-01-30 NOTE — Progress Notes (Signed)
Quick Note:  Left message with MRI brain results, per Dr. Vickey Huger. ______

## 2013-01-30 NOTE — Progress Notes (Signed)
Quick Note:  Left message with unchanged MRI brain results, per Dr. Vickey Huger. ______

## 2013-02-10 DIAGNOSIS — G501 Atypical facial pain: Secondary | ICD-10-CM | POA: Insufficient documentation

## 2013-02-11 ENCOUNTER — Telehealth: Payer: Self-pay

## 2013-02-11 NOTE — Telephone Encounter (Signed)
Message copied by [facility] on Mon Feb 11, 2013  3:31 PM ------      Message from: [facility], CARMEN      Created: Tue Jan 29, 2013  5:07 PM       Please relate  Her unchanged MRI results . See addendum. CD ------

## 2013-02-11 NOTE — Telephone Encounter (Signed)
Message copied by [facility] on Mon Feb 11, 2013  3:33 PM ------      Message from: [facility], CARMEN      Created: Tue Jan 29, 2013  5:07 PM       Please relate  Her unchanged MRI results . See addendum. CD ------

## 2013-02-11 NOTE — Telephone Encounter (Signed)
I called patient and asked if she had received the results for her MRI. She said she did get the results and requested I confirm that the MRI was unchanged from the last. I did that.

## 2013-02-18 ENCOUNTER — Encounter: Payer: Self-pay | Admitting: Neurology

## 2013-02-19 ENCOUNTER — Encounter: Payer: Self-pay | Admitting: Neurology

## 2013-02-19 ENCOUNTER — Ambulatory Visit (INDEPENDENT_AMBULATORY_CARE_PROVIDER_SITE_OTHER): Payer: Medicare Other | Admitting: Neurology

## 2013-02-19 VITALS — BP 124/73 | HR 74 | Resp 16 | Ht 58.5 in | Wt 132.0 lb

## 2013-02-19 DIAGNOSIS — G509 Disorder of trigeminal nerve, unspecified: Secondary | ICD-10-CM

## 2013-02-19 MED ORDER — TOPIRAMATE 25 MG PO TABS: 25.0000 mg | ORAL_TABLET | Freq: Two times a day (BID) | ORAL | Status: DC

## 2013-02-19 NOTE — Progress Notes (Signed)
[facility]  Provider:  Dr Tyrene Nader Referring Provider: Enrique Sack, MD Primary Care Physician:  Enrique Sack, MD  Chief Complaint  Patient presents with  . Follow-up    concussion,rm 11    HPI:  Caitlin Irwin is a 62 y.o. female here as a referral from Dr. Chilton Si for recent falls and concussion in a chronic headache and migraine patient.  The patient was Caitlin Irwin in 2012 was reported to which was treated and has been nonmalignant.  Also has developed trigeminal neuralgia and has so far not found a continued as sustainable relief with medications,  she was referred for a  Gamma knife procedure was Dr. Angelyn Punt  in May 2013,  but not  much difference in terms of pain control for a facial pain and her headaches,  These  have been chronic without change.  She is using Imitrex as before for the migrainous headaches she also has a recent fall and  postconcussion  Related additional pain which has been daily.   I would like to point out that she underwent a second  sub occipital craniotomy for microvascular decompression of the left trigeminal motor by Dr. Angelyn Punt  at [facility] on May 20th 2013. She was on high doses of Neurontin. She could not tolerate these well, and is referred for  a consult with Dr. Alden Hipp at [facility] for an injection or ablation . She tried trileptal for facial pain, and felt no relief. She developed diplopia. Her pain specialist now considers topiramate again. 8 years ago she had word finding difficulties, but now she is retired and less conserned about this.  Is the trigeminal neuralgia a sign of a maliganancy ? 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  The tip of her tongue is sore.    Dr Vear Clock maintains her on methadone on low dose.   A she reports that she has no relief for her headaches when she uses Imitrex at less then 100 mg, but more wore his some is that recently after initial relief she has rebound  headaches within about 8 hours. She is interested in a DHE protocol.  MRI brain was unremarkable .  CT head and CT cervical spine. June 2014  With multilevel DDD.  Review of Caitlin Irwin is hospital and ER visit notes as well as a brief admission that followed. And gave her copies of her CT and MRI reports I also have a note from Dr. Angelyn Punt , mentioning a  spheno palatine  ganglion block. I will contact Dr. Neale Burly  @ [facility] to see if she can have a ambulatory DHE protocol regimen.       Review of Systems: Out of a complete 14 system review, the patient complains of only the following symptoms, and all other reviewed systems are negative. headache facial pain, neck pain and stiffness.   History   Social History  . Marital Status: Married    Spouse Name: N/A    Number of Children: N/A  . Years of Education: college   Occupational History  . disabled    Social History Main Topics  . Smoking status: Never Smoker   . Smokeless tobacco: Never Used  . Alcohol Use: No  . Drug Use: No  . Sexually Active: Not on file   Other Topics Concern  . Not on file   Social History Narrative  . No narrative on file    Family History  Problem Relation Age of Onset  . Ovarian cancer Mother   . Migraines Mother   . Heart disease Maternal Grandfather   . Diabetes Father   . Migraines Brother   . Heart disease Other     Past Medical History  Diagnosis Date  . Other constipation   . Unspecified hypothyroidism   . IBS (irritable bowel syndrome)   . PUD (peptic ulcer disease)   . Chronic pain     scoliosis  . Parotid tumor   . Anemia   . Anxiety   . Blood transfusion   . Cataract   . GERD (gastroesophageal reflux disease)   . Osteoporosis   . H/O hiatal hernia   . Trigeminal neuralgia   . S/P radiation therapy   . PONV (postoperative nausea and vomiting)     hx of " getting too much" - 1998, slow to take up by body   . Chronic kidney disease     hx of uti    . Depression   . Migraines   . High cholesterol   . 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 Hypersomnia   . Parotid tumor   . Ulcer     Past Surgical History  Procedure Laterality Date  . Cholecystectomy    . Abdominal hysterectomy    . Right knee surgery    . L5 surgery    . Heary surgery      correct hole in the heart  . Left ankle    . Back surgery    . Left shoulder surgery     . Cut muscles and nerves in head       to stop migraines and it did not work   . Heart outpaitent surgery       to correct hole in heart   . Brain surgery      parotid tumor removed   . Esophagogastroduodenoscopy  06/12/2012    Procedure: ESOPHAGOGASTRODUODENOSCOPY (EGD);  Surgeon: Shirley Friar, MD;  Location: [facility];  Service: Endoscopy;  Laterality: N/A;  . Gamma knife for trig neuralgia  12/27/11  . Parotio gland      mass on gland removed, benign  . Mass excision  2012    for degenerative disk disease    Current Outpatient Prescriptions  Medication Sig Dispense Refill  . clonazePAM (KLONOPIN) 1 MG tablet Take 1 tablet (1 mg total) by mouth 3 (three) times daily as needed for anxiety. Takes 0.5mg  in the morning and 2mg  in the evening  60 tablet  2  . DIMENHYDRINATE PO Take by mouth as needed.      Marland Kitchen estrogens, conjugated, (PREMARIN) 0.625 MG tablet Take 0.625 mg by mouth daily.       Marland Kitchen FIBER SELECT GUMMIES PO Take 1 tablet by mouth at bedtime.      Marland Kitchen FLUoxetine (PROZAC) 10 MG tablet Take 10 mg by mouth daily.      . INULIN PO Take by mouth. Take one capsule two times daily      . levothyroxine (SYNTHROID, LEVOTHROID) 75 MCG tablet Take 75 mcg by mouth daily before breakfast.       . methadone (DOLOPHINE) 5 MG tablet Take 5 mg by mouth 2 (two) times daily.       . Multiple Vitamins-Minerals (ADULT ONE DAILY GUMMIES PO) Take 2 tablets by mouth daily.      . naproxen sodium (ANAPROX) 220 MG tablet Take 440 mg by mouth 2 (two)  times daily as needed (for pain).      .  ondansetron (ZOFRAN) 4 MG tablet Take 1 tablet (4 mg total) by mouth as needed. nausea  20 tablet  3  . Oxcarbazepine (TRILEPTAL) 300 MG tablet Take 300 mg by mouth 2 (two) times daily. One am, one pm      . Oxcarbazepine (TRILEPTAL) 300 MG tablet Take 2 tablets qhs, one tablet in the AM and one in afternoon for three days, then increase to 3 tabs in AM 1 in afternoon and 2 tabs Qhs      . pantoprazole (PROTONIX) 40 MG tablet Take 40 mg by mouth 2 (two) times daily.       Marland Kitchen SIMETHICONE PO Take by mouth as needed.      Marland Kitchen spironolactone (ALDACTONE) 100 MG tablet Take 100 mg by mouth daily before breakfast.       . SUMAtriptan (IMITREX) 100 MG tablet Take 100 mg by mouth as needed. migraine      . valACYclovir (VALTREX) 500 MG tablet Take 500 mg by mouth daily.      Marland Kitchen zolpidem (AMBIEN) 10 MG tablet Take 10 mg by mouth at bedtime as needed for sleep.      . cevimeline (EVOXAC) 30 MG capsule       . PEG 3350-KCl-NaBcb-NaCl-NaSulf (PEG-3350/ELECTROLYTES) 236 G SOLR       . pilocarpine (SALAGEN) 5 MG tablet       . promethazine (PHENERGAN) 12.5 MG suppository       . [DISCONTINUED] eletriptan (RELPAX) 40 MG tablet One tablet by mouth as needed for migraine headache.  If the headache improves and then returns, dose may be repeated after 2 hours have elapsed since first dose (do not exceed 80 mg per day). may repeat in 2 hours if necessary      . [DISCONTINUED] ferrous sulfate 325 (65 FE) MG tablet Take 325 mg by mouth daily with breakfast.       No current facility-administered medications for this visit.    Allergies as of 02/19/2013 - Review Complete 02/19/2013  Allergen Reaction Noted  . Acetaminophen Nausea And Vomiting     Vitals: BP 124/73  Pulse 74  Resp 16  Ht 4' 10.5" (1.486 m)  Wt 132 lb (59.875 kg)  BMI 27.11 kg/m2 Last Weight:  Wt Readings from Last 1 Encounters:  02/19/13 132 lb (59.875 kg)   Last Height:   Ht Readings from Last 1 Encounters:  02/19/13 4' 10.5" (1.486 m)    Cardiovascular:  Regular rate and rhythm, without  murmurs or carotid bruit, and without distended neck veins. Respiratory: Lungs are clear to auscultation. Skin:  Without evidence of edema, or rash Trunk: BMI is normal .  Neurologic exam : The patient is awake and alert, oriented to place and time.  Memory subjective described as intact. There is a normal attention span & concentration ability. Speech is fluent without  dysarthria, dysphonia or aphasia. Mood and affect are appropriate.  Cranial nerves: Pupils are equal and briskly reactive to light. Funduscopic exam without  evidence of pallor or edema. Extraocular movements  in vertical and horizontal planes intact and without nystagmus. Visual fields by finger perimetry are intact. Hearing to finger rub intact.  Facial sensation  Is  Hypersensitive on the right face . But intact to fine touch. Facial motor strength is symmetric and tongue and uvula move midline.  Motor exam: Normal tone and normal muscle bulk and symmetric normal strength in all extremities.  Sensory:  Fine touch, pinprick and vibration were tested in all extremities. Proprioception is tested  normal.  Coordination: Rapid alternating movements in the fingers/hands is tested and normal. Finger-to-nose maneuver tested and normal without evidence of ataxia, dysmetria or tremor.  Gait and station: Patient walks without assistive device and is able climb up to the exam table. Strength within normal limits. Stance is stable and normal. Tandem gait  unfragmented. Romberg testing isnormal.  Deep tendon reflexes: in the  upper and lower extremities are symmetric and intact. Babinski maneuver response is downgoing.   Assessment:  After physical and neurologic examination, review of laboratory studies, imaging, neurophysiology testing and pre-existing records, assessment will be reviewed on the problem list.   I recommend to followup on the possible sphenopalatine ganglion  block. I have contacted Dr. Onnie Boer office and expect him to come back on Thursday regarding a DHE portal cold. Today I will wean the patient off Trileptal and try Topamax for facial pain. I be felt her Imitrex. I understand her concern about the high doses of Neurontin and the feeling that she is somewhat under the influence by taking high doses. The diplopia on the Trileptal also is of course including her fall and accident risk. A revisit will be made for 2-3 months from now.  I will keep in contact next Thursday after having  Information  from Dr. Neale Burly in regards to an ambulatory regimen of  dehydroergotamine. Plan:  Treatment plan and additional workup will be reviewed under Problem List.

## 2013-02-22 ENCOUNTER — Other Ambulatory Visit: Payer: Self-pay | Admitting: Neurology

## 2013-03-05 ENCOUNTER — Other Ambulatory Visit: Payer: Self-pay | Admitting: Neurology

## 2013-03-06 ENCOUNTER — Telehealth: Payer: Self-pay

## 2013-03-06 NOTE — Telephone Encounter (Signed)
Patient has an appointment tomorrow with Dr. Chilton Si (internist) at 3 p.m. She is also waiting for a call back from ENT. Patient has blisters in her mouth and gets very dry and feels like it is spreading down to throat. She had a procedure for her trigeminal nerve pain about a year ago. Things seem to be getting worse. Feels like whole body is infected, itches, burns, stings.   Patient states that she is not sure she is comfortable going through the [facility]. She is sleeping almost all the time due to the pain.  Patient is off prozac and off topamax.   I have advised her to follow up with ENT and make sure we get results.

## 2013-03-06 NOTE — Telephone Encounter (Signed)
Message copied by [facility] on Wed Mar 06, 2013  5:14 PM ------      Message from: [facility], CARMEN      Created: Tue Jan 29, 2013  5:08 PM       Please call with stable MRI brain . ------

## 2013-03-06 NOTE — Telephone Encounter (Signed)
Rx signed and faxed.

## 2013-03-13 ENCOUNTER — Telehealth: Payer: Self-pay | Admitting: Neurology

## 2013-03-13 ENCOUNTER — Ambulatory Visit: Payer: Self-pay | Admitting: Neurology

## 2013-03-15 ENCOUNTER — Telehealth: Payer: Self-pay | Admitting: Neurology

## 2013-03-15 NOTE — Telephone Encounter (Signed)
Dr. Santiago Glad office does not do infusion therapy- I can order Migranol nasal spray for her, we have samples,  she is not allowed to use Imitrex or any other Triptan within 24 hours of using Migranol nasal spray. She will use Phenergan by mouth 30 minutes before taking Migranal nasal spray, and 10 mg prednisone by mouth in the morning. She will use a nasal spray one spray at a time only- twice a day at 8 AM and 3 PM, for two days.  Would like NP / WID to explain the DHE non iv protocol with her.

## 2013-03-15 NOTE — Telephone Encounter (Signed)
Patient following up in regards to if calls were received back from Dr. Onnie Boer office about DHE portal cold.

## 2013-04-15 ENCOUNTER — Ambulatory Visit (INDEPENDENT_AMBULATORY_CARE_PROVIDER_SITE_OTHER): Payer: No Typology Code available for payment source | Admitting: Licensed Clinical Social Worker

## 2013-04-15 DIAGNOSIS — F331 Major depressive disorder, recurrent, moderate: Secondary | ICD-10-CM

## 2013-04-16 ENCOUNTER — Other Ambulatory Visit (HOSPITAL_COMMUNITY): Payer: Self-pay | Admitting: Gastroenterology

## 2013-04-16 DIAGNOSIS — R1110 Vomiting, unspecified: Secondary | ICD-10-CM

## 2013-04-22 ENCOUNTER — Ambulatory Visit: Payer: Medicare Other | Admitting: Licensed Clinical Social Worker

## 2013-04-25 ENCOUNTER — Encounter (HOSPITAL_COMMUNITY)
Admission: RE | Admit: 2013-04-25 | Discharge: 2013-04-25 | Disposition: A | Discharge status: Home / Self Care | Payer: Medicare Other | Source: Ambulatory Visit | Attending: Gastroenterology | Admitting: Gastroenterology

## 2013-04-25 DIAGNOSIS — R1110 Vomiting, unspecified: Secondary | ICD-10-CM

## 2013-04-25 MED ORDER — TECHNETIUM TC 99M SULFUR COLLOID
2.0000 | Freq: Once | INTRAVENOUS | Status: AC | PRN
  Administered 2013-04-25: 2 via ORAL

## 2013-04-29 ENCOUNTER — Ambulatory Visit: Payer: No Typology Code available for payment source | Admitting: Licensed Clinical Social Worker

## 2013-04-30 ENCOUNTER — Telehealth: Payer: Self-pay

## 2013-04-30 NOTE — Telephone Encounter (Signed)
Misty Stanley from [facility] called stating the Prior Auth request we sent for Ondansetron has been approved effective 04/29/2013-04/29/2014.

## 2013-05-29 ENCOUNTER — Encounter: Payer: Self-pay | Admitting: Neurology

## 2013-05-29 ENCOUNTER — Ambulatory Visit (INDEPENDENT_AMBULATORY_CARE_PROVIDER_SITE_OTHER): Payer: Medicare Other | Admitting: Neurology

## 2013-05-29 VITALS — BP 100/63 | HR 104 | Ht <= 58 in | Wt 118.0 lb

## 2013-05-29 DIAGNOSIS — G43901 Migraine, unspecified, not intractable, with status migrainosus: Secondary | ICD-10-CM

## 2013-05-29 DIAGNOSIS — G500 Trigeminal neuralgia: Secondary | ICD-10-CM

## 2013-05-29 MED ORDER — SUMATRIPTAN SUCCINATE 100 MG PO TABS: 100.0000 mg | ORAL_TABLET | ORAL | Status: DC | PRN

## 2013-05-29 MED ORDER — TEMAZEPAM 15 MG PO CAPS: 15.0000 mg | ORAL_CAPSULE | Freq: Every evening | ORAL | Status: DC | PRN

## 2013-05-29 MED ORDER — TEMAZEPAM 15 MG PO CAPS: 30.0000 mg | ORAL_CAPSULE | Freq: Every evening | ORAL | Status: DC | PRN

## 2013-05-29 NOTE — Progress Notes (Signed)
[facility]  Provider:  Melvyn Novas, M D  Referring Provider: Enrique Sack, MD Primary Care Physician:  Enrique Sack, MD  Chief Complaint  Patient presents with  . Migranes    HPI:  Caitlin Irwin is a 62 y.o. female  Is seen here as a referral/ revisit  from Dr. Angelyn Punt , after a gamma knife surgery.   She has had success in the treatment of the neuralgia, but numbness remained, see letter for Dr. Angelyn Punt.  He suggested a sphenopalatine ganglion block or ablation.  I offered her to discuss this procedure, and this  to be done here in [place] with Dr. Bonnielee Haff.  [name] is again insomnic, sleeps on Ambien only 2-3 hours , has rebound insomnia from pain.  She is biting the inside of her buccal tissue.      Review of Systems: Out of a complete 14 system review, the patient complains of only the following symptoms, and all other reviewed systems are negative. TN pain and numbness post gamma knife, incomplete resolution , still  has daily  Migraine.    History   Social History  . Marital Status: Married    Spouse Name: N/A    Number of Children: N/A  . Years of Education: college   Occupational History  . disabled    Social History Main Topics  . Smoking status: Never Smoker   . Smokeless tobacco: Never Used  . Alcohol Use: No  . Drug Use: No  . Sexual Activity: Not on file   Other Topics Concern  . Not on file   Social History Narrative  . No narrative on file    Family History  Problem Relation Age of Onset  . Ovarian cancer Mother   . Migraines Mother   . Heart disease Maternal Grandfather   . Diabetes Father   . Migraines Brother   . Heart disease Other     Past Medical History  Diagnosis Date  . Other constipation   . Unspecified hypothyroidism   . IBS (irritable bowel syndrome)   . PUD (peptic ulcer disease)   . Chronic pain     scoliosis  . Parotid tumor   . Anemia   . Anxiety   . Blood transfusion   . Cataract    . GERD (gastroesophageal reflux disease)   . Osteoporosis   . H/O hiatal hernia   . Trigeminal neuralgia   . S/P radiation therapy   . PONV (postoperative nausea and vomiting)     hx of " getting too much" - 1998, slow to take up by body   . Chronic kidney disease     hx of uti   . Depression   . Migraines   . High cholesterol   . 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 Hypersomnia   . Parotid tumor   . Ulcer     Past Surgical History  Procedure Laterality Date  . Cholecystectomy    . Abdominal hysterectomy    . Right knee surgery    . L5 surgery    . Heary surgery      correct hole in the heart  . Left ankle    . Back surgery    . Left shoulder surgery     . Cut muscles and nerves in head       to stop migraines and it did not work   . Heart outpaitent surgery  to correct hole in heart   . Brain surgery      parotid tumor removed   . Esophagogastroduodenoscopy  06/12/2012    Procedure: ESOPHAGOGASTRODUODENOSCOPY (EGD);  Surgeon: Shirley Friar, MD;  Location: [facility];  Service: Endoscopy;  Laterality: N/A;  . Gamma knife for trig neuralgia  12/27/11  . Parotio gland      mass on gland removed, benign  . Mass excision  2012    for degenerative disk disease    Current Outpatient Prescriptions  Medication Sig Dispense Refill  . clonazePAM (KLONOPIN) 1 MG tablet Take 0.5 mg by mouth 3 (three) times daily as needed for anxiety. Takes 0.5mg  in the morning and 2mg  in the evening      . DIMENHYDRINATE PO Take by mouth as needed.      Marland Kitchen FIBER SELECT GUMMIES PO Take 1 tablet by mouth 2 (two) times daily.       Marland Kitchen levothyroxine (SYNTHROID, LEVOTHROID) 75 MCG tablet Take 75 mcg by mouth daily before breakfast.       . methadone (DOLOPHINE) 5 MG tablet Take 5 mg by mouth 2 (two) times daily.       . Multiple Vitamins-Minerals (ADULT ONE DAILY GUMMIES PO) Take 2 tablets by mouth daily.      . naproxen sodium (ANAPROX) 220 MG tablet Take 440 mg by  mouth 2 (two) times daily as needed (for pain).      . pantoprazole (PROTONIX) 40 MG tablet Take 40 mg by mouth 2 (two) times daily.       Marland Kitchen PEG 3350-KCl-NaBcb-NaCl-NaSulf (PEG-3350/ELECTROLYTES) 236 G SOLR       . pilocarpine (SALAGEN) 5 MG tablet       . spironolactone (ALDACTONE) 100 MG tablet Take 100 mg by mouth daily before breakfast.       . topiramate (TOPAMAX) 25 MG tablet Take 1 tablet (25 mg total) by mouth 2 (two) times daily.  120 tablet  3  . cevimeline (EVOXAC) 30 MG capsule       . estradiol (ESTRACE) 1 MG tablet       . estrogens, conjugated, (PREMARIN) 0.625 MG tablet Take 0.625 mg by mouth daily.       Marland Kitchen FLUoxetine (PROZAC) 10 MG tablet Take 10 mg by mouth daily.      . INULIN PO Take by mouth. Take one capsule two times daily      . montelukast (SINGULAIR) 10 MG tablet       . ondansetron (ZOFRAN) 4 MG tablet TAKE 1 TABLET BY MOUTH AS NEEDED FOR NAUSEA.  20 tablet  5  . Oxcarbazepine (TRILEPTAL) 300 MG tablet Take 300 mg by mouth 2 (two) times daily. One am, one pm      . promethazine (PHENERGAN) 12.5 MG suppository       . SIMETHICONE PO Take by mouth as needed.      . SUMAtriptan (IMITREX) 100 MG tablet TAKE AT ONSET OF MIGRAINE AS NEEDED  15 tablet  3  . temazepam (RESTORIL) 15 MG capsule Take 2 capsules (30 mg total) by mouth at bedtime as needed for sleep.  30 capsule  5  . valACYclovir (VALTREX) 500 MG tablet Take 500 mg by mouth daily.      . [DISCONTINUED] eletriptan (RELPAX) 40 MG tablet One tablet by mouth as needed for migraine headache.  If the headache improves and then returns, dose may be repeated after 2 hours have elapsed since first dose (do not exceed 80 mg  per day). may repeat in 2 hours if necessary      . [DISCONTINUED] ferrous sulfate 325 (65 FE) MG tablet Take 325 mg by mouth daily with breakfast.       No current facility-administered medications for this visit.    Allergies as of 05/29/2013 - Review Complete 05/29/2013  Allergen Reaction Noted   . Acetaminophen Nausea And Vomiting     Vitals: BP 100/63  Pulse 104  Ht 4\' 10"  (1.473 m)  Wt 118 lb (53.524 kg)  BMI 24.67 kg/m2 Last Weight:  Wt Readings from Last 1 Encounters:  05/29/13 118 lb (53.524 kg)   Last Height:   Ht Readings from Last 1 Encounters:  05/29/13 4\' 10"  (1.473 m)    Plan:  Treatment plan and additional workup :  referral to Dr. Bonnielee Haff or Dr Hosie Poisson for sphenopalatine ganglion block.  Marland Kitchen

## 2013-05-30 ENCOUNTER — Other Ambulatory Visit: Payer: Self-pay | Admitting: Neurology

## 2013-06-06 ENCOUNTER — Other Ambulatory Visit: Payer: Self-pay

## 2013-06-06 ENCOUNTER — Other Ambulatory Visit: Payer: Self-pay | Admitting: Neurology

## 2013-06-10 ENCOUNTER — Telehealth: Payer: Self-pay | Admitting: Neurology

## 2013-06-10 DIAGNOSIS — G43901 Migraine, unspecified, not intractable, with status migrainosus: Secondary | ICD-10-CM

## 2013-06-10 DIAGNOSIS — G500 Trigeminal neuralgia: Secondary | ICD-10-CM

## 2013-06-10 NOTE — Telephone Encounter (Signed)
Patient is wanting to speak with Dr Vickey Huger about a pain block nasal spray that they had discussed during last office visit, also is requesting a refill on sumatriptan 100 mg

## 2013-06-13 ENCOUNTER — Other Ambulatory Visit: Payer: Self-pay | Admitting: Neurology

## 2013-06-13 MED ORDER — SUMATRIPTAN SUCCINATE 100 MG PO TABS: 100.0000 mg | ORAL_TABLET | ORAL | Status: DC | PRN

## 2013-06-13 NOTE — Telephone Encounter (Signed)
I will talk to Angie in billing about it.  The problem thus far I think has been a question in reimbursement.

## 2013-06-13 NOTE — Telephone Encounter (Signed)
Pt's prescription was faxed over to [facility] at [phone]. °

## 2013-06-13 NOTE — Telephone Encounter (Signed)
refilled sumatriptan,  I will ask Heide Guile to kindly discuss the sphenoid block procedure.

## 2013-06-16 ENCOUNTER — Other Ambulatory Visit: Payer: Self-pay | Admitting: Neurology

## 2013-06-17 ENCOUNTER — Other Ambulatory Visit: Payer: Self-pay | Admitting: Neurology

## 2013-06-17 NOTE — Telephone Encounter (Signed)
Pt's prescription was faxed over to [facility] at [phone]. °

## 2013-06-20 NOTE — Telephone Encounter (Signed)
Please let me know if and when we can start this sphenoid block - is Dr Hosie Poisson or Larita Fife aware ? CD

## 2013-07-01 ENCOUNTER — Telehealth: Payer: Self-pay | Admitting: Neurology

## 2013-07-01 NOTE — Telephone Encounter (Signed)
Looks like this is for [employer]

## 2013-07-01 NOTE — Telephone Encounter (Signed)
We will not be offering this here due to difficulty with reimbursement. -LL

## 2013-07-01 NOTE — Telephone Encounter (Signed)
Spoke with patient and she wants to speak with Dr Vickey Huger concerning status on the nasal spray (Dr Hawk),discussed at last visit, wants to possibly proceed with this procedure, is in unbearable pain now and only comfort is lying down. Please call.

## 2013-07-03 NOTE — Telephone Encounter (Signed)
Dr Hosie Poisson - will we do this or not.?

## 2013-07-04 NOTE — Telephone Encounter (Signed)
We are not ready to treat patients with this block, but dr Bonnielee Haff at [facility] has offered to do these. Is she OK with going to him. I will refer. CD

## 2013-07-05 MED ORDER — ZOLPIDEM TARTRATE 10 MG PO TABS: 10.0000 mg | ORAL_TABLET | Freq: Every evening | ORAL | Status: AC | PRN

## 2013-07-05 NOTE — Telephone Encounter (Signed)
Spoke with patient to give message per Dr Vickey Huger and patient said that she had just loss her husband yesterday but she is ok with Dr Bonnielee Haff. She also said that the sleeping pills that were prescribed(lunesta), is too expensive and would like to go back to ambien-12.5mg .

## 2013-07-08 ENCOUNTER — Other Ambulatory Visit: Payer: Self-pay | Admitting: Neurology

## 2013-07-08 NOTE — Telephone Encounter (Signed)
Rx faxed over to [facility] at [phone].

## 2013-07-10 ENCOUNTER — Telehealth: Payer: Self-pay | Admitting: Neurology

## 2013-07-10 NOTE — Telephone Encounter (Signed)
I can understand that this is a very difficult time, but higher dose of Klonipin is too risky. I have referred her to dr [facility] for migraine treatment.

## 2013-07-10 NOTE — Telephone Encounter (Signed)
Called patient and left VM message from Dr Dohmeier's note below

## 2013-07-19 ENCOUNTER — Ambulatory Visit (INDEPENDENT_AMBULATORY_CARE_PROVIDER_SITE_OTHER): Payer: No Typology Code available for payment source | Admitting: Licensed Clinical Social Worker

## 2013-07-19 DIAGNOSIS — F331 Major depressive disorder, recurrent, moderate: Secondary | ICD-10-CM

## 2013-07-22 ENCOUNTER — Ambulatory Visit: Payer: No Typology Code available for payment source | Admitting: Licensed Clinical Social Worker

## 2013-08-06 ENCOUNTER — Ambulatory Visit (INDEPENDENT_AMBULATORY_CARE_PROVIDER_SITE_OTHER): Payer: No Typology Code available for payment source | Admitting: Licensed Clinical Social Worker

## 2013-08-06 DIAGNOSIS — F331 Major depressive disorder, recurrent, moderate: Secondary | ICD-10-CM

## 2013-08-09 ENCOUNTER — Ambulatory Visit: Payer: No Typology Code available for payment source | Admitting: Licensed Clinical Social Worker

## 2013-08-12 ENCOUNTER — Ambulatory Visit: Payer: No Typology Code available for payment source | Admitting: Licensed Clinical Social Worker

## 2013-08-20 ENCOUNTER — Ambulatory Visit (INDEPENDENT_AMBULATORY_CARE_PROVIDER_SITE_OTHER): Payer: No Typology Code available for payment source | Admitting: Licensed Clinical Social Worker

## 2013-08-20 DIAGNOSIS — F331 Major depressive disorder, recurrent, moderate: Secondary | ICD-10-CM

## 2013-08-30 ENCOUNTER — Ambulatory Visit (INDEPENDENT_AMBULATORY_CARE_PROVIDER_SITE_OTHER): Payer: Medicare Other | Admitting: Nurse Practitioner

## 2013-08-30 ENCOUNTER — Encounter: Payer: Self-pay | Admitting: Nurse Practitioner

## 2013-08-30 VITALS — BP 120/69 | HR 106 | Ht 58.5 in | Wt 120.0 lb

## 2013-08-30 DIAGNOSIS — G500 Trigeminal neuralgia: Secondary | ICD-10-CM

## 2013-08-30 DIAGNOSIS — Z8669 Personal history of other diseases of the nervous system and sense organs: Secondary | ICD-10-CM

## 2013-08-30 DIAGNOSIS — G501 Atypical facial pain: Secondary | ICD-10-CM

## 2013-08-30 NOTE — Patient Instructions (Signed)
Will refer for sphenopalatine ganglion block.  Cheek swab today  F/U in 4 months with Dr. Brett Fairy

## 2013-08-30 NOTE — Progress Notes (Signed)
[facility]  PATIENT: Caitlin Irwin DOB: 03-13-1951   REASON FOR VISIT: Facial pain, history of migraines   HISTORY OF PRESENT ILLNESS: Caitlin Irwin, 63 year old female returns for followup. She had gamma knife with success however continued numbness. She was scheduled for ablation procedure but  her husband died unexpectedly and she canceled the appointment. She is here today to get that rescheduled, it has already been approved by her insurance. She continues to bite the inside of her buccal tissue. There has been no change in her numbness. She returns for reevaluation.    HISTORY gamma knife surgery for facial pain.  She has had success in the treatment of the neuralgia, but numbness remained, see letter for Dr. Salomon Fick.  He suggested a sphenopalatine ganglion block or ablation. I offered her to discuss this procedure, and this to be done here in [place] with Dr. Barbie Banner.  Caitlin Irwin is again insomnic, sleeps on Ambien only 2-3 hours , has rebound insomnia from pain.  She is biting the inside of her buccal tissue.   REVIEW OF SYSTEMS: Full 14 system review of systems performed and notable only for those listed, all others are neg:  Constitutional: Fatigue Cardiovascular: N/A  Ear/Nose/Throat: N/A  Skin: N/A  Eyes: N/A  Respiratory: N/A  Gastroitestinal: N/A  Hematology/Lymphatic: Anemia  Endocrine: N/A Musculoskeletal:N/A  Allergy/Immunology: N/A  Neurological: N/A Psychiatric: Depression anxiety Sleep insomnia, daytime sleepiness   ALLERGIES: Allergies  Allergen Reactions  . Acetaminophen Nausea And Vomiting    HOME MEDICATIONS: Outpatient Prescriptions Prior to Visit  Medication Sig Dispense Refill  . clonazePAM (KLONOPIN) 1 MG tablet TAKE 1/2 TABLET IN THE MORNING AND 2 TABLETS IN THE EVENING.  NO EARLY REFILLS  75 tablet  5  . estradiol (ESTRACE) 1 MG tablet       . FIBER SELECT GUMMIES PO Take 1 tablet by mouth 2 (two) times daily.       Marland Kitchen  FLUoxetine (PROZAC) 10 MG tablet Take 20 mg by mouth daily.       Marland Kitchen levothyroxine (SYNTHROID, LEVOTHROID) 75 MCG tablet Take 75 mcg by mouth daily before breakfast.       . methadone (DOLOPHINE) 5 MG tablet Take 5 mg by mouth 2 (two) times daily.       . naproxen sodium (ANAPROX) 220 MG tablet Take 440 mg by mouth 2 (two) times daily as needed (for pain).      . ondansetron (ZOFRAN) 4 MG tablet TAKE 1 TABLET BY MOUTH AS NEEDED FOR NAUSEA  20 tablet  5  . pantoprazole (PROTONIX) 40 MG tablet Take 40 mg by mouth 2 (two) times daily.       . pilocarpine (SALAGEN) 5 MG tablet       . spironolactone (ALDACTONE) 100 MG tablet Take 100 mg by mouth daily before breakfast.       . SUMAtriptan (IMITREX) 100 MG tablet Take 1 tablet (100 mg total) by mouth every 2 (two) hours as needed for migraine. May repeat in 2 hours if headache persists or recurs.  45 tablet  3  . valACYclovir (VALTREX) 500 MG tablet Take 500 mg by mouth daily.      Marland Kitchen topiramate (TOPAMAX) 25 MG tablet Take 1 tablet (25 mg total) by mouth 2 (two) times daily.  120 tablet  3  . DIMENHYDRINATE PO Take by mouth as needed.      . cevimeline (EVOXAC) 30 MG capsule       . estrogens, conjugated, (PREMARIN)  0.625 MG tablet Take 0.625 mg by mouth daily.       . INULIN PO Take by mouth. Take one capsule two times daily      . montelukast (SINGULAIR) 10 MG tablet       . Multiple Vitamins-Minerals (ADULT ONE DAILY GUMMIES PO) Take 2 tablets by mouth daily.      . Oxcarbazepine (TRILEPTAL) 300 MG tablet Take 300 mg by mouth 2 (two) times daily. One am, one pm      . PEG 3350-KCl-NaBcb-NaCl-NaSulf (PEG-3350/ELECTROLYTES) 236 G SOLR       . promethazine (PHENERGAN) 12.5 MG suppository       . SIMETHICONE PO Take by mouth as needed.      . temazepam (RESTORIL) 15 MG capsule Take 1 capsule (15 mg total) by mouth at bedtime as needed for sleep.  30 capsule  0   No facility-administered medications prior to visit.    PAST MEDICAL HISTORY: Past  Medical History  Diagnosis Date  . Other constipation   . Unspecified hypothyroidism   . IBS (irritable bowel syndrome)   . PUD (peptic ulcer disease)   . Chronic pain     scoliosis  . Parotid tumor   . Anemia   . Anxiety   . Blood transfusion   . Cataract   . GERD (gastroesophageal reflux disease)   . Osteoporosis   . H/O hiatal hernia   . Trigeminal neuralgia   . S/P radiation therapy   . PONV (postoperative nausea and vomiting)     hx of " getting too much" - 1998, slow to take up by body   . Chronic kidney disease     hx of uti   . Depression   . Migraines   . High cholesterol   . 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 Hypersomnia   . Parotid tumor   . Ulcer     PAST SURGICAL HISTORY: Past Surgical History  Procedure Laterality Date  . Cholecystectomy    . Abdominal hysterectomy    . Right knee surgery    . L5 surgery    . Heary surgery      correct hole in the heart  . Left ankle    . Back surgery    . Left shoulder surgery     . Cut muscles and nerves in head       to stop migraines and it did not work   . Heart outpaitent surgery       to correct hole in heart   . Brain surgery      parotid tumor removed   . Esophagogastroduodenoscopy  06/12/2012    Procedure: ESOPHAGOGASTRODUODENOSCOPY (EGD);  Surgeon: Lear Ng, MD;  Location: [facility];  Service: Endoscopy;  Laterality: N/A;  . Gamma knife for trig neuralgia  12/27/11  . Parotio gland      mass on gland removed, benign  . Mass excision  2012    for degenerative disk disease    FAMILY HISTORY: Family History  Problem Relation Age of Onset  . Ovarian cancer Mother   . Migraines Mother   . Heart disease Maternal Grandfather   . Diabetes Father   . Migraines Brother   . Heart disease Other     SOCIAL HISTORY: History   Social History  . Marital Status: Widowed    Spouse Name: N/A    Number of Children: 0  . Years of Education: college  Occupational History    . disabled    Social History Main Topics  . Smoking status: Never Smoker   . Smokeless tobacco: Never Used  . Alcohol Use: No  . Drug Use: No  . Sexual Activity: Not on file   Other Topics Concern  . Not on file   Social History Narrative   Patient lives at home alone.    Patient husband past away 07-03-13.   Patient is widowed.   Patient is on disability.    Patient is right handed.   Patient has no children.   Patient is a college grad.     PHYSICAL EXAM  Filed Vitals:   08/30/13 1054  BP: 120/69  Pulse: 106  Height: 4' 10.5" (1.486 m)  Weight: 120 lb (54.432 kg)   Body mass index is 24.65 kg/(m^2).  Generalized: Well developed, in no acute distress  Head: normocephalic and atraumatic,. Oropharynx benign  Neck: Supple, no carotid bruits  Cardiac: Regular rate rhythm, no murmur  Musculoskeletal: No deformity   Neurological examination   Mentation: Alert oriented to time, place, history taking. Follows all commands speech and language fluent  Cranial nerve II-XII: Pupils were equal round reactive to light extraocular movements were full, visual field were full on confrontational test. Facial sensation and strength were normal. hearing was intact to finger rubbing bilaterally. Uvula tongue midline. head turning and shoulder shrug were normal and symmetric.Tongue protrusion into cheek strength was normal. Motor: normal bulk and tone, full strength in the BUE, BLE,  No focal weakness Sensory: normal and symmetric to light touch, pinprick, and  vibration  Coordination: finger-nose-finger, heel-to-shin bilaterally, no dysmetria Reflexes: Brachioradialis 2/2, biceps 2/2, triceps 2/2, patellar 2/2, Achilles 2/2, plantar responses were flexor bilaterally. Gait and Station: Rising up from seated position without assistance, normal stance,  moderate stride, good arm swing, smooth turning, able to perform tiptoe, and heel walking without difficulty. Tandem gait is  steady  DIAGNOSTIC DATA (LABS, IMAGING, TESTING) -  ASSESSMENT AND PLAN  63 y.o. year old female  has a past medical history of IBS (irritable bowel syndrome); PUD (peptic ulcer disease); Chronic pain; Parotid tumor; Anemia; Anxiety; Blood transfusion;  GERD (gastroesophageal reflux disease); Osteoporosis; H/O hiatal hernia; Trigeminal neuralgia; S/P radiation therapy;  Chronic kidney disease; Depression; Migraines; High cholesterol; Neuralgia; Hypersomnia; Parotid tumor; and Ulcer. For followup. She is a patient of Dr. Brett Fairy who is out of the office.   Will refer for sphenopalatine ganglion block.  F/U in 4 months with Dr. De Nurse, [facility], [facility], [facility] [address], [place], [place] [address] [phone]

## 2013-09-12 ENCOUNTER — Other Ambulatory Visit: Payer: Self-pay | Admitting: Neurology

## 2013-09-12 DIAGNOSIS — G43919 Migraine, unspecified, intractable, without status migrainosus: Secondary | ICD-10-CM

## 2013-10-04 ENCOUNTER — Ambulatory Visit
Admission: RE | Admit: 2013-10-04 | Discharge: 2013-10-04 | Disposition: A | Discharge status: Home / Self Care | Payer: Medicare Other | Source: Ambulatory Visit | Attending: Neurology | Admitting: Neurology

## 2013-10-04 VITALS — BP 122/56 | HR 63

## 2013-10-04 DIAGNOSIS — G43919 Migraine, unspecified, intractable, without status migrainosus: Secondary | ICD-10-CM

## 2013-10-04 MED ORDER — IOHEXOL 180 MG/ML  SOLN
1.0000 mL | Freq: Once | INTRAMUSCULAR | Status: AC | PRN
  Administered 2013-10-04: 1 mL

## 2013-10-07 NOTE — Progress Notes (Signed)
Pt states she has no improvement in face pain or feelings in her mouth post spenocath.dd

## 2013-10-11 ENCOUNTER — Other Ambulatory Visit: Payer: Self-pay | Admitting: Neurology

## 2013-10-11 NOTE — Telephone Encounter (Signed)
Please see titration instruction from [place] in July

## 2013-10-12 ENCOUNTER — Emergency Department (HOSPITAL_COMMUNITY)
Admission: EM | Admit: 2013-10-12 | Discharge: 2013-10-13 | Disposition: A | Discharge status: Home / Self Care | Payer: Medicare Other | Attending: Emergency Medicine | Admitting: Emergency Medicine

## 2013-10-12 ENCOUNTER — Emergency Department (HOSPITAL_COMMUNITY): Payer: Medicare Other

## 2013-10-12 DIAGNOSIS — G500 Trigeminal neuralgia: Secondary | ICD-10-CM | POA: Insufficient documentation

## 2013-10-12 DIAGNOSIS — F3289 Other specified depressive episodes: Secondary | ICD-10-CM

## 2013-10-12 DIAGNOSIS — Z9089 Acquired absence of other organs: Secondary | ICD-10-CM | POA: Insufficient documentation

## 2013-10-12 DIAGNOSIS — E039 Hypothyroidism, unspecified: Secondary | ICD-10-CM | POA: Insufficient documentation

## 2013-10-12 DIAGNOSIS — M4120 Other idiopathic scoliosis, site unspecified: Secondary | ICD-10-CM | POA: Insufficient documentation

## 2013-10-12 DIAGNOSIS — Z8679 Personal history of other diseases of the circulatory system: Secondary | ICD-10-CM | POA: Insufficient documentation

## 2013-10-12 DIAGNOSIS — G8929 Other chronic pain: Secondary | ICD-10-CM | POA: Insufficient documentation

## 2013-10-12 DIAGNOSIS — N189 Chronic kidney disease, unspecified: Secondary | ICD-10-CM | POA: Insufficient documentation

## 2013-10-12 DIAGNOSIS — Z923 Personal history of irradiation: Secondary | ICD-10-CM | POA: Insufficient documentation

## 2013-10-12 DIAGNOSIS — Z8719 Personal history of other diseases of the digestive system: Secondary | ICD-10-CM

## 2013-10-12 DIAGNOSIS — Z9889 Other specified postprocedural states: Secondary | ICD-10-CM | POA: Insufficient documentation

## 2013-10-12 DIAGNOSIS — Z8669 Personal history of other diseases of the nervous system and sense organs: Secondary | ICD-10-CM

## 2013-10-12 DIAGNOSIS — R1310 Dysphagia, unspecified: Secondary | ICD-10-CM

## 2013-10-12 DIAGNOSIS — Z90710 Acquired absence of both cervix and uterus: Secondary | ICD-10-CM | POA: Insufficient documentation

## 2013-10-12 DIAGNOSIS — G43909 Migraine, unspecified, not intractable, without status migrainosus: Secondary | ICD-10-CM | POA: Insufficient documentation

## 2013-10-12 DIAGNOSIS — K2970 Gastritis, unspecified, without bleeding: Secondary | ICD-10-CM | POA: Insufficient documentation

## 2013-10-12 DIAGNOSIS — F418 Other specified anxiety disorders: Secondary | ICD-10-CM | POA: Diagnosis present

## 2013-10-12 DIAGNOSIS — Z87440 Personal history of urinary (tract) infections: Secondary | ICD-10-CM | POA: Insufficient documentation

## 2013-10-12 DIAGNOSIS — G501 Atypical facial pain: Secondary | ICD-10-CM

## 2013-10-12 DIAGNOSIS — F22 Delusional disorders: Secondary | ICD-10-CM | POA: Insufficient documentation

## 2013-10-12 DIAGNOSIS — F411 Generalized anxiety disorder: Secondary | ICD-10-CM | POA: Insufficient documentation

## 2013-10-12 DIAGNOSIS — R198 Other specified symptoms and signs involving the digestive system and abdomen: Secondary | ICD-10-CM

## 2013-10-12 DIAGNOSIS — K219 Gastro-esophageal reflux disease without esophagitis: Secondary | ICD-10-CM

## 2013-10-12 DIAGNOSIS — R110 Nausea: Secondary | ICD-10-CM

## 2013-10-12 DIAGNOSIS — K269 Duodenal ulcer, unspecified as acute or chronic, without hemorrhage or perforation: Secondary | ICD-10-CM | POA: Insufficient documentation

## 2013-10-12 DIAGNOSIS — R4182 Altered mental status, unspecified: Secondary | ICD-10-CM | POA: Insufficient documentation

## 2013-10-12 DIAGNOSIS — Z79899 Other long term (current) drug therapy: Secondary | ICD-10-CM | POA: Insufficient documentation

## 2013-10-12 DIAGNOSIS — R197 Diarrhea, unspecified: Secondary | ICD-10-CM

## 2013-10-12 DIAGNOSIS — K589 Irritable bowel syndrome without diarrhea: Secondary | ICD-10-CM | POA: Insufficient documentation

## 2013-10-12 DIAGNOSIS — K5909 Other constipation: Secondary | ICD-10-CM

## 2013-10-12 DIAGNOSIS — R635 Abnormal weight gain: Secondary | ICD-10-CM | POA: Insufficient documentation

## 2013-10-12 DIAGNOSIS — K2990 Gastroduodenitis, unspecified, without bleeding: Secondary | ICD-10-CM

## 2013-10-12 DIAGNOSIS — K270 Acute peptic ulcer, site unspecified, with hemorrhage: Secondary | ICD-10-CM

## 2013-10-12 DIAGNOSIS — Z862 Personal history of diseases of the blood and blood-forming organs and certain disorders involving the immune mechanism: Secondary | ICD-10-CM | POA: Insufficient documentation

## 2013-10-12 DIAGNOSIS — F329 Major depressive disorder, single episode, unspecified: Secondary | ICD-10-CM

## 2013-10-12 DIAGNOSIS — K5900 Constipation, unspecified: Secondary | ICD-10-CM | POA: Insufficient documentation

## 2013-10-12 DIAGNOSIS — F32A Depression, unspecified: Secondary | ICD-10-CM | POA: Diagnosis present

## 2013-10-12 LAB — RAPID URINE DRUG SCREEN, HOSP PERFORMED
AMPHETAMINES: POSITIVE — AB
BENZODIAZEPINES: POSITIVE — AB
Barbiturates: NOT DETECTED
Cocaine: NOT DETECTED
OPIATES: NOT DETECTED
Tetrahydrocannabinol: NOT DETECTED

## 2013-10-12 LAB — COMPREHENSIVE METABOLIC PANEL
ALBUMIN: 4.4 g/dL (ref 3.5–5.2)
ALT: 12 U/L (ref 0–35)
AST: 29 U/L (ref 0–37)
Alkaline Phosphatase: 58 U/L (ref 39–117)
BUN: 17 mg/dL (ref 6–23)
CALCIUM: 9.6 mg/dL (ref 8.4–10.5)
CO2: 25 mEq/L (ref 19–32)
CREATININE: 1.12 mg/dL — AB (ref 0.50–1.10)
Chloride: 104 mEq/L (ref 96–112)
GFR calc Af Amer: 59 mL/min — ABNORMAL LOW (ref >90)
GFR calc non Af Amer: 51 mL/min — ABNORMAL LOW (ref >90)
Glucose, Bld: 105 mg/dL — ABNORMAL HIGH (ref 70–99)
Potassium: 3.7 mEq/L (ref 3.7–5.3)
Sodium: 142 mEq/L (ref 137–147)
TOTAL PROTEIN: 7 g/dL (ref 6.0–8.3)
Total Bilirubin: 0.2 mg/dL — ABNORMAL LOW (ref 0.3–1.2)

## 2013-10-12 LAB — URINALYSIS, ROUTINE W REFLEX MICROSCOPIC
Bilirubin Urine: NEGATIVE
GLUCOSE, UA: NEGATIVE mg/dL
Hgb urine dipstick: NEGATIVE
Ketones, ur: NEGATIVE mg/dL
Leukocytes, UA: NEGATIVE
Nitrite: NEGATIVE
Protein, ur: NEGATIVE mg/dL
SPECIFIC GRAVITY, URINE: 1.02 (ref 1.005–1.030)
UROBILINOGEN UA: 0.2 mg/dL (ref 0.0–1.0)
pH: 6 (ref 5.0–8.0)

## 2013-10-12 LAB — CBC WITH DIFFERENTIAL/PLATELET
BASOS ABS: 0 10*3/uL (ref 0.0–0.1)
BASOS PCT: 0 % (ref 0–1)
EOS PCT: 0 % (ref 0–5)
Eosinophils Absolute: 0 10*3/uL (ref 0.0–0.7)
HCT: 32.9 % — ABNORMAL LOW (ref 36.0–46.0)
HEMOGLOBIN: 10.6 g/dL — AB (ref 12.0–15.0)
Lymphocytes Relative: 8 % — ABNORMAL LOW (ref 12–46)
Lymphs Abs: 1.2 10*3/uL (ref 0.7–4.0)
MCH: 25.7 pg — ABNORMAL LOW (ref 26.0–34.0)
MCHC: 32.2 g/dL (ref 30.0–36.0)
MCV: 79.7 fL (ref 78.0–100.0)
Monocytes Absolute: 0.8 10*3/uL (ref 0.1–1.0)
Monocytes Relative: 5 % (ref 3–12)
NEUTROS ABS: 12.8 10*3/uL — AB (ref 1.7–7.7)
Neutrophils Relative %: 87 % — ABNORMAL HIGH (ref 43–77)
Platelets: 338 10*3/uL (ref 150–400)
RBC: 4.13 MIL/uL (ref 3.87–5.11)
RDW: 18.2 % — ABNORMAL HIGH (ref 11.5–15.5)
WBC: 14.8 10*3/uL — AB (ref 4.0–10.5)

## 2013-10-12 LAB — ETHANOL

## 2013-10-12 MED ORDER — LEVOTHYROXINE SODIUM 75 MCG PO TABS
75.0000 ug | ORAL_TABLET | Freq: Every day | ORAL | Status: DC
  Administered 2013-10-13: 75 ug via ORAL
  Filled 2013-10-12 (×2): qty 1

## 2013-10-12 MED ORDER — VALACYCLOVIR HCL 500 MG PO TABS
500.0000 mg | ORAL_TABLET | Freq: Every day | ORAL | Status: DC
  Administered 2013-10-13: 500 mg via ORAL
  Filled 2013-10-12: qty 1

## 2013-10-12 MED ORDER — METHADONE HCL 10 MG PO TABS: 10.0000 mg | ORAL_TABLET | Freq: Every evening | ORAL | Status: DC | PRN

## 2013-10-12 MED ORDER — SODIUM CHLORIDE 0.9 % IV BOLUS (SEPSIS)
1000.0000 mL | Freq: Once | INTRAVENOUS | Status: AC
  Administered 2013-10-12: 1000 mL via INTRAVENOUS

## 2013-10-12 MED ORDER — PANTOPRAZOLE SODIUM 40 MG PO TBEC
40.0000 mg | DELAYED_RELEASE_TABLET | Freq: Two times a day (BID) | ORAL | Status: DC
  Administered 2013-10-13 (×2): 40 mg via ORAL
  Filled 2013-10-12 (×2): qty 1

## 2013-10-12 MED ORDER — FLUOXETINE HCL 20 MG PO TABS
20.0000 mg | ORAL_TABLET | Freq: Every day | ORAL | Status: DC
  Administered 2013-10-13: 20 mg via ORAL
  Filled 2013-10-12: qty 1

## 2013-10-12 MED ORDER — SPIRONOLACTONE 100 MG PO TABS
100.0000 mg | ORAL_TABLET | Freq: Every day | ORAL | Status: DC
  Administered 2013-10-13: 100 mg via ORAL
  Filled 2013-10-12: qty 1

## 2013-10-12 MED ORDER — TOPIRAMATE 25 MG PO TABS: 25.0000 mg | ORAL_TABLET | Freq: Every evening | ORAL | Status: DC | PRN

## 2013-10-12 MED ORDER — ESTRADIOL 1 MG PO TABS
1.0000 mg | ORAL_TABLET | Freq: Every day | ORAL | Status: DC
  Administered 2013-10-13: 1 mg via ORAL
  Filled 2013-10-12: qty 1

## 2013-10-12 MED ORDER — SUMATRIPTAN SUCCINATE 100 MG PO TABS
100.0000 mg | ORAL_TABLET | ORAL | Status: DC | PRN
  Filled 2013-10-12: qty 1

## 2013-10-12 MED ORDER — IBUPROFEN 200 MG PO TABS
600.0000 mg | ORAL_TABLET | Freq: Three times a day (TID) | ORAL | Status: DC | PRN
  Filled 2013-10-12: qty 3

## 2013-10-12 MED ORDER — LORAZEPAM 1 MG PO TABS
1.0000 mg | ORAL_TABLET | Freq: Three times a day (TID) | ORAL | Status: DC | PRN
  Administered 2013-10-13: 1 mg via ORAL
  Filled 2013-10-12: qty 1

## 2013-10-12 NOTE — BH Assessment (Signed)
Received call for assessment. Dr. Vanita Panda states Pt appears psychotic and is a poor historian. Tele-assessment will be initiated.  Orpah Greek Rosana Hoes, [facility] Triage Specialist
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
## 2013-10-12 NOTE — ED Notes (Signed)
Per EMS pt states she feel that her skin is crawling. States she does not have enough medication and needs authorization. She is unable to get a refill. She states she has been under a lot of stress. Husband passed away 2 mos. Ago.

## 2013-10-12 NOTE — ED Provider Notes (Addendum)
CSN: [id number]     Arrival date & time 10/12/13  1951 History   First MD Initiated Contact with Patient 10/12/13 1959     Chief Complaint  Patient presents with  . Psychiatric Evaluation     (Consider location/radiation/quality/duration/timing/severity/associated sxs/prior Treatment) HPI Patient presents to progress to the neighbor to 2 concerns of psychosis. The patient herself denies pain states that she feels generally unwell, but she is incapable of telling a coherent story of why she is here. She rambles about fencing, wood products, her husbands death, medication.  Level V caveat - altered mental status   Past Medical History  Diagnosis Date  . Other constipation   . Unspecified hypothyroidism   . IBS (irritable bowel syndrome)   . PUD (peptic ulcer disease)   . Chronic pain     scoliosis  . Parotid tumor   . Anemia   . Anxiety   . Blood transfusion   . Cataract   . GERD (gastroesophageal reflux disease)   . Osteoporosis   . H/O hiatal hernia   . Trigeminal neuralgia   . S/P radiation therapy   . PONV (postoperative nausea and vomiting)     hx of " getting too much" - 1998, slow to take up by body   . Chronic kidney disease     hx of uti   . Depression   . Migraines   . High cholesterol   . 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 Hypersomnia   . Parotid tumor   . Ulcer    Past Surgical History  Procedure Laterality Date  . Cholecystectomy    . Abdominal hysterectomy    . Right knee surgery    . L5 surgery    . Heary surgery      correct hole in the heart  . Left ankle    . Back surgery    . Left shoulder surgery     . Cut muscles and nerves in head       to stop migraines and it did not work   . Heart outpaitent surgery       to correct hole in heart   . Brain surgery      parotid tumor removed   . Esophagogastroduodenoscopy  06/12/2012    Procedure: ESOPHAGOGASTRODUODENOSCOPY (EGD);  Surgeon: Lear Ng, MD;  Location: [facility];  Service: Endoscopy;  Laterality: N/A;  . Gamma knife for trig neuralgia  12/27/11  . Parotio gland      mass on gland removed, benign  . Mass excision  2012    for degenerative disk disease   Family History  Problem Relation Age of Onset  . Ovarian cancer Mother   . Migraines Mother   . Heart disease Maternal Grandfather   . Diabetes Father   . Migraines Brother   . Heart disease Other    History  Substance Use Topics  . Smoking status: Never Smoker   . Smokeless tobacco: Never Used  . Alcohol Use: No   OB History   Grav Para Term Preterm Abortions TAB SAB Ect Mult Living                 Review of Systems  Unable to perform ROS: Mental status change      Allergies  Acetaminophen  Home Medications   Current Outpatient Rx  Name  Route  Sig  Dispense  Refill  . clonazePAM (KLONOPIN) 1 MG tablet  Oral   Take 2 mg by mouth at bedtime as needed for anxiety.         Marland Kitchen estradiol (ESTRACE) 1 MG tablet   Oral   Take 1 mg by mouth daily.          Marland Kitchen FIBER SELECT GUMMIES PO   Oral   Take 1 tablet by mouth 2 (two) times daily.          Marland Kitchen FLUoxetine (PROZAC) 10 MG tablet   Oral   Take 20 mg by mouth daily.          Marland Kitchen levothyroxine (SYNTHROID, LEVOTHROID) 75 MCG tablet   Oral   Take 75 mcg by mouth daily before breakfast.          . methadone (DOLOPHINE) 5 MG tablet   Oral   Take 10 mg by mouth at bedtime as needed for moderate pain.          . pantoprazole (PROTONIX) 40 MG tablet   Oral   Take 40 mg by mouth 2 (two) times daily.          Marland Kitchen spironolactone (ALDACTONE) 100 MG tablet   Oral   Take 100 mg by mouth daily before breakfast.          . SUMAtriptan (IMITREX) 100 MG tablet   Oral   Take 1 tablet (100 mg total) by mouth every 2 (two) hours as needed for migraine. May repeat in 2 hours if headache persists or recurs.   45 tablet   3   . topiramate (TOPAMAX) 25 MG tablet   Oral   Take 25 mg by mouth at bedtime as needed  (sleep).         . traZODone (DESYREL) 100 MG tablet   Oral   Take 50-100 mg by mouth at bedtime as needed.          . valACYclovir (VALTREX) 500 MG tablet   Oral   Take 500 mg by mouth daily.          BP 107/73  Pulse 100  Temp(Src) 97.7 F (36.5 C) (Oral)  Resp 18  SpO2 96% Physical Exam  Nursing note and vitals reviewed. Constitutional: She is oriented to person, place, and time. She appears well-developed and well-nourished. No distress.  HENT:  Head: Normocephalic and atraumatic.  Mouth/Throat: Mucous membranes are pale and dry.  Eyes: Conjunctivae and EOM are normal.  Cardiovascular: Normal rate and regular rhythm.   Pulmonary/Chest: Effort normal and breath sounds normal. No stridor. No respiratory distress.  Abdominal: She exhibits no distension.  Musculoskeletal: She exhibits no edema.  Neurological: She is alert and oriented to person, place, and time. No cranial nerve deficit.  Patient moves all extremities spontaneously, has no facial asymmetry, speech is clear.  However, she does not cooperate fully with a neurologic exam.   Skin: Skin is warm and dry.  Psychiatric: Her mood appears anxious. Her speech is delayed and tangential. She is slowed and withdrawn. Thought content is delusional. Cognition and memory are impaired. She expresses no homicidal and no suicidal ideation.    ED Course  Procedures (including critical care time) Labs Review Labs Reviewed  CBC WITH DIFFERENTIAL - Abnormal; Notable for the following:    WBC 14.8 (*)    Hemoglobin 10.6 (*)    HCT 32.9 (*)    MCH 25.7 (*)    RDW 18.2 (*)    Neutrophils Relative % 87 (*)    Neutro Abs 12.8 (*)  Lymphocytes Relative 8 (*)    All other components within normal limits  COMPREHENSIVE METABOLIC PANEL - Abnormal; Notable for the following:    Glucose, Bld 105 (*)    Creatinine, Ser 1.12 (*)    Total Bilirubin 0.2 (*)    GFR calc non Af Amer 51 (*)    GFR calc Af Amer 59 (*)    All  other components within normal limits  URINALYSIS, ROUTINE W REFLEX MICROSCOPIC - Abnormal; Notable for the following:    APPearance CLOUDY (*)    All other components within normal limits  URINE RAPID DRUG SCREEN (HOSP PERFORMED) - Abnormal; Notable for the following:    Benzodiazepines POSITIVE (*)    Amphetamines POSITIVE (*)    All other components within normal limits  ETHANOL   Imaging Review No results found.  After the initial evaluation I reviewed the patient's chart, and her extensive medication list.   11:16 PM I discussed the patient's case with our behavioral health team after their initial evaluation. They recommend additional neurologic evaluation prior to psychiatry completion.  Patient's neurologic exam here is unremarkable aside from disorientation.  She does move all to be spontaneously, has no facial asymmetry, has clear speech, has no cerebellar deficits.  11:31 PM On re-exam the patient is ambulatory.  She continues to have rambling speech.  She voices concern over psych eval and being considered crazy, but seems to understand that at the least her medications need to be adjusted and that she will benefit from psych eval.  MDM  This patient presents with concerns for altered mental status.  Notably, the patient does have a history of multiple medical problems, and takes multiple medications, including methadone, trazodone, Topamax.  Patient's evaluation is reassuring for the absence of findings for trauma, or neurologic deficits beyond the disorientation.  Patient's presentation may be secondary to medication use or cessation abruptly, though concern for psychosis exists.  Patient has mild leukocytosis, but a soft abdomen, no XR e/o PNA, and is afebrile.  Patient is medically clear for further psychiatry evaluation.   Carmin Muskrat, MD 10/12/13 Boyd, MD 10/12/13 [phone]

## 2013-10-12 NOTE — ED Notes (Signed)
Unable to stay focus on general conversation pertaining to symptoms or plan of care. Pt is forgetful. Denies hallucinations or illusions.

## 2013-10-12 NOTE — ED Notes (Signed)
Bed: WA20 Expected date: 10/12/13 Expected time: 7:38 PM Means of arrival: Ambulance Comments: Psych issues

## 2013-10-12 NOTE — BH Assessment (Addendum)
Assessment complete. Per [employer], [facility] at [facility], there is a female bed available on the [address]hall. Consulted with Darlyne Russian, PA who said due to Pt's presentation and report of recent falls she needs "a neurologic workup" before being evaluated by psychiatry. Communicated recommendation to Dr. Carmin Muskrat who said he has cleared Pt neurologically and requests Pt be seen by psychiatry. Notified Darlyne Russian, PA of consult request.  Orpah Greek Anson Fret, [facility], [facility] Triage Specialist

## 2013-10-13 ENCOUNTER — Encounter (HOSPITAL_COMMUNITY): Payer: Self-pay | Admitting: Registered Nurse

## 2013-10-13 DIAGNOSIS — F418 Other specified anxiety disorders: Secondary | ICD-10-CM | POA: Diagnosis present

## 2013-10-13 DIAGNOSIS — R4182 Altered mental status, unspecified: Secondary | ICD-10-CM | POA: Diagnosis present

## 2013-10-13 DIAGNOSIS — F329 Major depressive disorder, single episode, unspecified: Secondary | ICD-10-CM | POA: Diagnosis present

## 2013-10-13 DIAGNOSIS — Z79899 Other long term (current) drug therapy: Secondary | ICD-10-CM

## 2013-10-13 DIAGNOSIS — F32A Depression, unspecified: Secondary | ICD-10-CM | POA: Diagnosis present

## 2013-10-13 MED ORDER — MENTHOL 3 MG MT LOZG
1.0000 | LOZENGE | OROMUCOSAL | Status: DC | PRN
  Filled 2013-10-13: qty 9

## 2013-10-13 NOTE — ED Notes (Signed)
Dr Lenna Sciara and shuvon np into see

## 2013-10-13 NOTE — Discharge Instructions (Signed)

## 2013-10-13 NOTE — ED Notes (Addendum)
Pt up to the desk-reports her ride has left due to the delay with dc and she has to finds another ride home

## 2013-10-13 NOTE — ED Notes (Signed)
Pt also has brown boots.
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
## 2013-10-13 NOTE — ED Notes (Addendum)
Up at the desk on the phone,more focused and conversive, feeling some better, ambulatory w/o difficulty

## 2013-10-13 NOTE — ED Notes (Signed)
Up to the bathroom, unsteady using the wall to steady herself.  Pt encouraged to move slowly, and let staff know when she needs to get up.

## 2013-10-13 NOTE — ED Provider Notes (Signed)
Pt of Dr Vanita Panda awaiting psych team eval and dispo.  Nurse indicates pt had c/o mouth/throat pain. Pt states pain inside of lower lip and throat, and that mouth feels dry.  Mmm. No ulcerations or mucosal lesions noted. Pharynx without erythema or exudate. Pt drinking fluids, voice normal. Throat lozenges for symptom relief.    Recheck resting comfortably, awaiting psych eval.     Mirna Mires, MD 10/13/13 [phone]

## 2013-10-13 NOTE — ED Notes (Addendum)
Up to the desk on the phone, waiting for dc instructions, pt has contacted family to pick her up

## 2013-10-13 NOTE — ED Notes (Addendum)
Pt has been put in scrubs and wanded by security  Belongings:  Pink Purse Purple, square LG Cell phone Black long sleeve top Black Leggings Pink and orange striped underwear Red socks  Black makeup bag Gold wedding ring with diamond Gold wedding band Silver toe ring Black scrunchie
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
## 2013-10-13 NOTE — ED Notes (Signed)
Up to the bathroom w/ assist 

## 2013-10-13 NOTE — ED Notes (Signed)
Pt family and MD at bedside. Discussed plan of care with family.

## 2013-10-13 NOTE — ED Notes (Signed)
Written dc instructions reviewed w/ pt.  Pt encouraged to follow up with her PMD and neurologist, keep her appointment with the pain clinic tomorrow, and and take her medications as directed.  Pt encouraged to return/seek treatment for further problems, eat regular meals and drink fluids.  Pt verbalized understanding.  Pt ambulatory to dc window w/ mHt, Belongings and medications returned after patient left the unit.

## 2013-10-17 NOTE — Consult Note (Signed)
Agree with plan 

## 2013-10-29 ENCOUNTER — Ambulatory Visit (INDEPENDENT_AMBULATORY_CARE_PROVIDER_SITE_OTHER): Payer: No Typology Code available for payment source | Admitting: Licensed Clinical Social Worker

## 2013-10-29 DIAGNOSIS — F331 Major depressive disorder, recurrent, moderate: Secondary | ICD-10-CM

## 2013-11-04 ENCOUNTER — Ambulatory Visit (INDEPENDENT_AMBULATORY_CARE_PROVIDER_SITE_OTHER): Payer: No Typology Code available for payment source | Admitting: Licensed Clinical Social Worker

## 2013-11-04 DIAGNOSIS — F331 Major depressive disorder, recurrent, moderate: Secondary | ICD-10-CM

## 2013-11-06 ENCOUNTER — Telehealth: Payer: Self-pay | Admitting: Neurology

## 2013-11-06 NOTE — Telephone Encounter (Signed)
Pt called and stated that she had the procedure Dr. Brett Fairy recommended on 10-04-13. She states that the pain is getting so much worse and she does not think that she can wait until September to be seen.  Please call patient to discuss the procedure as well as schedule her an earlier appointment.  Thank you.

## 2013-11-06 NOTE — Telephone Encounter (Signed)
Pt calling stating that she had the procedure Dr. Brett Fairy recommended on 10/04/13 and pt states that the pain is getting worse and does not want to wait until September to be seen. Please advise

## 2013-11-06 NOTE — Telephone Encounter (Signed)
I would like for her to see carolyn [facility] as a RV and I will join the discussion.  This sphenoidal block was never started at our office, but Dr Barbie Banner at [facility] does it.

## 2013-11-08 NOTE — Telephone Encounter (Signed)
Patient was scheduled an appointment with Hoyle Sauer on November 14, 2013 at 9:30 am.

## 2013-11-11 ENCOUNTER — Other Ambulatory Visit: Payer: Self-pay | Admitting: Neurology

## 2013-11-14 ENCOUNTER — Ambulatory Visit: Payer: Self-pay | Admitting: Nurse Practitioner

## 2013-11-14 ENCOUNTER — Telehealth: Payer: Self-pay | Admitting: Nurse Practitioner

## 2013-11-14 NOTE — Telephone Encounter (Signed)
No showed for scheduled appointment 

## 2013-11-15 ENCOUNTER — Ambulatory Visit (INDEPENDENT_AMBULATORY_CARE_PROVIDER_SITE_OTHER): Payer: No Typology Code available for payment source | Admitting: Licensed Clinical Social Worker

## 2013-11-15 DIAGNOSIS — F331 Major depressive disorder, recurrent, moderate: Secondary | ICD-10-CM

## 2013-11-25 ENCOUNTER — Ambulatory Visit: Payer: No Typology Code available for payment source | Admitting: Licensed Clinical Social Worker

## 2013-12-18 ENCOUNTER — Other Ambulatory Visit: Payer: Self-pay | Admitting: Neurology

## 2013-12-18 NOTE — Telephone Encounter (Signed)
Rx signed and faxed.

## 2013-12-18 NOTE — Telephone Encounter (Signed)
Patient has rescheduled appt (no showed last OV)

## 2013-12-27 ENCOUNTER — Ambulatory Visit: Payer: Medicare Other | Admitting: Nurse Practitioner

## 2013-12-31 ENCOUNTER — Other Ambulatory Visit: Payer: Self-pay | Admitting: Neurology

## 2014-01-01 NOTE — Telephone Encounter (Signed)
Patient rescheduled appt for 07/24

## 2014-01-13 ENCOUNTER — Telehealth: Payer: Self-pay | Admitting: Neurology

## 2014-01-13 NOTE — Telephone Encounter (Signed)
[employer] rep called concerning a prior authorization on SUMAtriptan (IMITREX) 100 MG tablet, she states that with only needing 15 tablets she does not need prior authorization. She did get her 15 this morning at pharmacy.   FYI: pt can get up to 18 for 30 days or 45 for 90 days, but if she gets the 90 day supply it will need to go through the mail order. Thanks

## 2014-01-22 ENCOUNTER — Ambulatory Visit (INDEPENDENT_AMBULATORY_CARE_PROVIDER_SITE_OTHER): Payer: No Typology Code available for payment source | Admitting: Licensed Clinical Social Worker

## 2014-01-22 DIAGNOSIS — F331 Major depressive disorder, recurrent, moderate: Secondary | ICD-10-CM

## 2014-02-18 ENCOUNTER — Institutional Professional Consult (permissible substitution): Payer: Medicare Other | Admitting: Neurology

## 2014-02-21 ENCOUNTER — Ambulatory Visit: Payer: Medicare Other | Admitting: Nurse Practitioner

## 2014-02-24 ENCOUNTER — Institutional Professional Consult (permissible substitution): Payer: Self-pay | Admitting: Neurology

## 2014-02-26 ENCOUNTER — Institutional Professional Consult (permissible substitution): Payer: Self-pay | Admitting: Neurology

## 2014-03-05 ENCOUNTER — Telehealth: Payer: Self-pay | Admitting: Neurology

## 2014-03-05 NOTE — Telephone Encounter (Signed)
Patient requesting sooner appointment with Dr. Brett Fairy than the one scheduled on 9/3, states that she recently lost her husband and is trying to deal with that, please return call to patient and advise.

## 2014-03-06 NOTE — Telephone Encounter (Signed)
Patient states that she can wait until her September appointment to see Dr. Brett Fairy.  Patient was offered an office visit with Cecille Rubin on August 17.

## 2014-03-18 NOTE — Telephone Encounter (Signed)
Dear Caitlin Irwin,  ,I have not seen you in a while , not since your husbands death.  This must be a difficult time for you.  You have been unable to attend several appointments due to illness, an certainly, grief. Please let me know if you want to reschedule and when- would you prefer a late appointment, or at lunchtime?  If you prefer a visit with NP over an  MD ,call me-   Larey Seat, MD

## 2014-03-20 NOTE — Telephone Encounter (Signed)
Patient was called and her appt was moved up to March 24, 2014 at 1:00 pm,  from September 3.

## 2014-03-24 ENCOUNTER — Encounter: Payer: Self-pay | Admitting: Neurology

## 2014-03-24 ENCOUNTER — Ambulatory Visit (INDEPENDENT_AMBULATORY_CARE_PROVIDER_SITE_OTHER): Payer: Medicare Other | Admitting: Neurology

## 2014-03-24 VITALS — BP 115/71 | HR 84 | Resp 15 | Ht 58.5 in | Wt 108.0 lb

## 2014-03-24 DIAGNOSIS — F4321 Adjustment disorder with depressed mood: Secondary | ICD-10-CM

## 2014-03-24 DIAGNOSIS — F32A Depression, unspecified: Secondary | ICD-10-CM

## 2014-03-24 DIAGNOSIS — F19982 Other psychoactive substance use, unspecified with psychoactive substance-induced sleep disorder: Secondary | ICD-10-CM

## 2014-03-24 DIAGNOSIS — F3289 Other specified depressive episodes: Secondary | ICD-10-CM

## 2014-03-24 DIAGNOSIS — F4329 Adjustment disorder with other symptoms: Secondary | ICD-10-CM | POA: Insufficient documentation

## 2014-03-24 DIAGNOSIS — R270 Ataxia, unspecified: Secondary | ICD-10-CM

## 2014-03-24 DIAGNOSIS — Z9181 History of falling: Secondary | ICD-10-CM

## 2014-03-24 DIAGNOSIS — T50904A Poisoning by unspecified drugs, medicaments and biological substances, undetermined, initial encounter: Secondary | ICD-10-CM

## 2014-03-24 DIAGNOSIS — R296 Repeated falls: Secondary | ICD-10-CM

## 2014-03-24 DIAGNOSIS — F329 Major depressive disorder, single episode, unspecified: Secondary | ICD-10-CM

## 2014-03-24 DIAGNOSIS — G43701 Chronic migraine without aura, not intractable, with status migrainosus: Secondary | ICD-10-CM

## 2014-03-24 DIAGNOSIS — R279 Unspecified lack of coordination: Secondary | ICD-10-CM

## 2014-03-24 DIAGNOSIS — Z634 Disappearance and death of family member: Secondary | ICD-10-CM

## 2014-03-24 HISTORY — DX: Ataxia, unspecified: R27.0

## 2014-03-24 MED ORDER — CLONAZEPAM 1 MG PO TABS: 2.0000 mg | ORAL_TABLET | Freq: Every day | ORAL | Status: DC

## 2014-03-24 MED ORDER — SUMATRIPTAN SUCCINATE 100 MG PO TABS: ORAL_TABLET | ORAL | Status: DC

## 2014-03-24 NOTE — Patient Instructions (Addendum)
Insomnia Insomnia is frequent trouble falling and/or staying asleep. Insomnia can be a long term problem or a short term problem. Both are common. Insomnia can be a short term problem when the wakefulness is related to a certain stress or worry. Long term insomnia is often related to ongoing stress during waking hours and/or poor sleeping habits. Overtime, sleep deprivation itself can make the problem worse. Every little thing feels more severe because you are overtired and your ability to cope is decreased. CAUSES   Stress, anxiety, and depression.  Poor sleeping habits.  Distractions such as TV in the bedroom.  Naps close to bedtime.  Engaging in emotionally charged conversations before bed.  Technical reading before sleep.  Alcohol and other sedatives. They may make the problem worse. They can hurt normal sleep patterns and normal dream activity.  Stimulants such as caffeine for several hours prior to bedtime.  Pain syndromes and shortness of breath can cause insomnia.  Exercise late at night.  Changing time zones may cause sleeping problems (jet lag). It is sometimes helpful to have someone observe your sleeping patterns. They should look for periods of not breathing during the night (sleep apnea). They should also look to see how long those periods last. If you live alone or observers are uncertain, you can also be observed at a sleep clinic where your sleep patterns will be professionally monitored. Sleep apnea requires a checkup and treatment. Give your caregivers your medical history. Give your caregivers observations your family has made about your sleep.  SYMPTOMS   Not feeling rested in the morning.  Anxiety and restlessness at bedtime.  Difficulty falling and staying asleep. TREATMENT   Your caregiver may prescribe treatment for an underlying medical disorders. Your caregiver can give advice or help if you are using alcohol or other drugs for self-medication. Treatment  of underlying problems will usually eliminate insomnia problems.  Medications can be prescribed for short time use. They are generally not recommended for lengthy use.  Over-the-counter sleep medicines are not recommended for lengthy use. They can be habit forming.  You can promote easier sleeping by making lifestyle changes such as:  Using relaxation techniques that help with breathing and reduce muscle tension.  Exercising earlier in the day.  Changing your diet and the time of your last meal. No night time snacks.  Establish a regular time to go to bed.  Counseling can help with stressful problems and worry.  Soothing music and white noise may be helpful if there are background noises you cannot remove.  Stop tedious detailed work at least one hour before bedtime. HOME CARE INSTRUCTIONS   Keep a diary. Inform your caregiver about your progress. This includes any medication side effects. See your caregiver regularly. Take note of:  Times when you are asleep.  Times when you are awake during the night.  The quality of your sleep.  How you feel the next day. This information will help your caregiver care for you.  Get out of bed if you are still awake after 15 minutes. Read or do some quiet activity. Keep the lights down. Wait until you feel sleepy and go back to bed.  Keep regular sleeping and waking hours. Avoid naps.  Exercise regularly.  Avoid distractions at bedtime. Distractions include watching television or engaging in any intense or detailed activity like attempting to balance the household checkbook.  Develop a bedtime ritual. Keep a familiar routine of bathing, brushing your teeth, climbing into bed at the same   time each night, listening to soothing music. Routines increase the success of falling to sleep faster.  Use relaxation techniques. This can be using breathing and muscle tension release routines. It can also include visualizing peaceful scenes. You can  also help control troubling or intruding thoughts by keeping your mind occupied with boring or repetitive thoughts like the old concept of counting sheep. You can make it more creative like imagining planting one beautiful flower after another in your backyard garden.  During your day, work to eliminate stress. When this is not possible use some of the previous suggestions to help reduce the anxiety that accompanies stressful situations. MAKE SURE YOU:   Understand these instructions.  Will watch your condition.  Will get help right away if you are not doing well or get worse. Document Released: 07/15/2000 Document Revised: 10/10/2011 Document Reviewed: 08/15/2007 [facility] Patient Information 2015 [place], [place]. This information is not intended to replace advice given to you by your health care provider. Make sure you discuss any questions you have with your health care provider. Suicidal Feelings, How to Help Yourself Everyone feels sad or unhappy at times, but depressing thoughts and feelings of hopelessness can lead to thoughts of suicide. It can seem as if life is too tough to handle. If you feel as though you have reached the point where suicide is the only answer, it is time to let someone know immediately.  HOW TO COPE AND PREVENT SUICIDE  Let family, friends, teachers, or counselors know. Get help. Try not to isolate yourself from those who care about you. Even though you may not feel sociable, talk with someone every day. It is best if it is face-to-face. Remember, they will want to help you.  Eat a regularly spaced and well-balanced diet.  Get plenty of rest.  Avoid alcohol and drugs because they will only make you feel worse and may also lower your inhibitions. Remove them from the home. If you are thinking of taking an overdose of your prescribed medicines, give your medicines to someone who can give them to you one day at a time. If you are on antidepressants, let your caregiver  know of your feelings so he or she can provide a safer medicine, if that is a concern.  Remove weapons or poisons from your home.  Try to stick to routines. Follow a schedule and remind yourself that you have to keep that schedule every day.  Set some realistic goals and achieve them. Make a list and cross things off as you go. Accomplishments give a sense of worth. Wait until you are feeling better before doing things you find difficult or unpleasant to do.  If you are able, try to start exercising. Even half-hour periods of exercise each day will make you feel better. Getting out in the sun or into nature helps you recover from depression faster. If you have a favorite place to walk, take advantage of that.  Increase safe activities that have always given you pleasure. This may include playing your favorite music, reading a good book, painting a picture, or playing your favorite instrument. Do whatever takes your mind off your depression.  Keep your living space well-lighted. GET HELP Contact a suicide hotline, crisis center, or local suicide prevention center for help right away. Local centers may include a hospital, clinic, community service organization, social service provider, or health department.  Call your local emergency services (911 in the [place]).  Call a suicide hotline:  1-800-273-TALK (469)609-9908)  in the [place].  1-800-SUICIDE (731)811-0787) in the [place].  773-322-0390 in the [place] for Spanish-speaking counselors.  9-794-801-6PVV 254-519-4761) in the [place] for TTY users.  Visit the following websites for information and help:  National Suicide Prevention Lifeline: www.suicidepreventionlifeline.org  Hopeline: www.hopeline.[place] for Suicide Prevention: PromotionalLoans.co.za  For lesbian, gay, bisexual, transgender, or questioning youth, contact [employer]:  4-492-0-F-EOFHQR 684 387 0986) in the  [place].  www.thetrevorproject.org  In [place], treatment resources are listed in each [place] with listings available under USAA for [employer] or similar titles. Another source for Crisis Centres by [place] is located at http://www.suicideprevention.ca/in-crisis-now/find-a-crisis-centre-now/crisis-centres Document Released: 01/22/2003 Document Revised: 10/10/2011 Document Reviewed: 11/12/2013 [facility] Patient Information 2015 [place], [place]. This information is not intended to replace advice given to you by your health care provider. Make sure you discuss any questions you have with your health care provider.

## 2014-03-24 NOTE — Progress Notes (Signed)
[facility]  PATIENT: Caitlin Irwin DOB: 1951/06/05   REASON FOR VISIT: Facial pain, history of migraines   HISTORY OF PRESENT ILLNESS: Caitlin Irwin, 63 year old female, recently widowed, returns for followup.  She had gamma knife with success however continued numbness.  She was scheduled for ablation procedure but  her husband died unexpectedly and she canceled the appointment. She is here today to get that rescheduled, it has already been approved by her insurance. She continues to bite the inside of her buccal tissue. There has been no change in her numbness. She returns for reevaluation.  She is severely depressed, and contacted hospice to join a grief counseling group. She has severe insomnia, has lost weight, and is visibly aged.  She has tried all kinds of sleep aids, none  gives her the desired effect. She likely overdosed on a mix of pills, and had a breakdown last month.  She has had multiple falls, injured her rotator cuff and back. She is stressed, her house is on the market, she can not find the things she looks for.  She cannot find certain objects out of the inheritance. 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    HISTORY CD- 0 2014 ; gamma knife surgery for facial pain.  She has had success in the treatment of the neuralgia, but numbness remained, see letter for Dr. Salomon Fick.  He suggested a sphenopalatine ganglion block or ablation. I offered her to discuss this procedure, and this to be done here in [place] with Dr. Barbie Banner.  Omie is again insomnic, sleeps on Ambien only 2-3 hours , has rebound insomnia from pain.  She is biting the inside of her buccal tissue.   REVIEW OF SYSTEMS: Full 14 system review of systems performed and notable only for those listed, all others are neg:  Constitutional: Fatigue, depression, HA.  Cardiovascular: N/A  Ear/Nose/Throat: N/A  Skin: N/A  Eyes: N/A  Respiratory: N/A  Gastroitestinal: N/A    Hematology/Lymphatic: Anemia  Endocrine: N/A Musculoskeletal:N/A  Allergy/Immunology: N/A  Neurological: N/A Psychiatric: Depression anxiety Sleep insomnia, daytime sleepiness   ALLERGIES: Allergies  Allergen Reactions  . Acetaminophen Nausea And Vomiting    HOME MEDICATIONS: Outpatient Prescriptions Prior to Visit  Medication Sig Dispense Refill  . clonazePAM (KLONOPIN) 1 MG tablet TAKE 1/2 TABLET IN THE MORNING AND 2 TABLETS IN THE EVENING.  NO EARLY REFILLS  75 tablet  5  . estradiol (ESTRACE) 1 MG tablet       . FIBER SELECT GUMMIES PO Take 1 tablet by mouth 2 (two) times daily.       Marland Kitchen FLUoxetine (PROZAC) 10 MG tablet Take 20 mg by mouth daily.       Marland Kitchen levothyroxine (SYNTHROID, LEVOTHROID) 75 MCG tablet Take 75 mcg by mouth daily before breakfast.       . methadone (DOLOPHINE) 5 MG tablet Take 5 mg by mouth 2 (two) times daily.       . naproxen sodium (ANAPROX) 220 MG tablet Take 440 mg by mouth 2 (two) times daily as needed (for pain).      . ondansetron (ZOFRAN) 4 MG tablet TAKE 1 TABLET BY MOUTH AS NEEDED FOR NAUSEA  20 tablet  5  . pantoprazole (PROTONIX) 40 MG tablet Take 40 mg by mouth 2 (two) times daily.       . pilocarpine (SALAGEN) 5 MG tablet       . spironolactone (ALDACTONE) 100 MG tablet Take 100 mg by mouth daily before breakfast.       .  SUMAtriptan (IMITREX) 100 MG tablet Take 1 tablet (100 mg total) by mouth every 2 (two) hours as needed for migraine. May repeat in 2 hours if headache persists or recurs.  45 tablet  3  . valACYclovir (VALTREX) 500 MG tablet Take 500 mg by mouth daily.      Marland Kitchen topiramate (TOPAMAX) 25 MG tablet Take 1 tablet (25 mg total) by mouth 2 (two) times daily.  120 tablet  3  . DIMENHYDRINATE PO Take by mouth as needed.      . cevimeline (EVOXAC) 30 MG capsule       . estrogens, conjugated, (PREMARIN) 0.625 MG tablet Take 0.625 mg by mouth daily.       . INULIN PO Take by mouth. Take one capsule two times daily      . montelukast  (SINGULAIR) 10 MG tablet       . Multiple Vitamins-Minerals (ADULT ONE DAILY GUMMIES PO) Take 2 tablets by mouth daily.      . Oxcarbazepine (TRILEPTAL) 300 MG tablet Take 300 mg by mouth 2 (two) times daily. One am, one pm      . PEG 3350-KCl-NaBcb-NaCl-NaSulf (PEG-3350/ELECTROLYTES) 236 G SOLR       . promethazine (PHENERGAN) 12.5 MG suppository       . SIMETHICONE PO Take by mouth as needed.      . temazepam (RESTORIL) 15 MG capsule Take 1 capsule (15 mg total) by mouth at bedtime as needed for sleep.  30 capsule  0   No facility-administered medications prior to visit.    PAST MEDICAL HISTORY: Past Medical History  Diagnosis Date  . Other constipation   . Unspecified hypothyroidism   . IBS (irritable bowel syndrome)   . PUD (peptic ulcer disease)   . Chronic pain     scoliosis  . Parotid tumor   . Anemia   . Anxiety   . Blood transfusion   . Cataract   . GERD (gastroesophageal reflux disease)   . Osteoporosis   . H/O hiatal hernia   . Trigeminal neuralgia   . S/P radiation therapy   . PONV (postoperative nausea and vomiting)     hx of " getting too much" - 1998, slow to take up by body   . Chronic kidney disease     hx of uti   . Depression   . Migraines   . High cholesterol   . 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 Hypersomnia   . Parotid tumor   . Ulcer     PAST SURGICAL HISTORY: Past Surgical History  Procedure Laterality Date  . Cholecystectomy    . Abdominal hysterectomy    . Right knee surgery    . L5 surgery    . Heary surgery      correct hole in the heart  . Left ankle    . Back surgery    . Left shoulder surgery     . Cut muscles and nerves in head       to stop migraines and it did not work   . Heart outpaitent surgery       to correct hole in heart   . Brain surgery      parotid tumor removed   . Esophagogastroduodenoscopy  06/12/2012    Procedure: ESOPHAGOGASTRODUODENOSCOPY (EGD);  Surgeon: Lear Ng, MD;   Location: [facility];  Service: Endoscopy;  Laterality: N/A;  . Gamma knife for trig neuralgia  12/27/11  .  Parotio gland      mass on gland removed, benign  . Mass excision  2012    for degenerative disk disease    FAMILY HISTORY: Family History  Problem Relation Age of Onset  . Ovarian cancer Mother   . Migraines Mother   . Heart disease Maternal Grandfather   . Diabetes Father   . Migraines Brother   . Heart disease Other     SOCIAL HISTORY: History   Social History  . Marital Status: Widowed    Spouse Name: N/A    Number of Children: 0  . Years of Education: college   Occupational History  . disabled    Social History Main Topics  . Smoking status: Never Smoker   . Smokeless tobacco: Never Used  . Alcohol Use: No  . Drug Use: No  . Sexual Activity: Not on file   Other Topics Concern  . Not on file   Social History Narrative   Patient lives at home alone.    Patient husband past away 07-03-13.   Patient is widowed.   Patient is on disability.    Patient is right handed.   Patient has no children.   Patient is a college grad.     PHYSICAL EXAM  Filed Vitals:   03/24/14 1314  BP: 115/71  Pulse: 84  Resp: 15  Height: 4' 10.5" (1.486 m)  Weight: 108 lb (48.988 kg)   Body mass index is 22.18 kg/(m^2).  Generalized: lost weight , has aged, very stressed. Denies suicidal ideas or plans.  Patient resumes an embryonal position, curled up on the chair.  Head: normocephalic and atraumatic, Oropharynx sores in the buccal and gum tissue, tongue is dry bite marks in the buccal tissue.  Neck: Supple, no carotid bruits , 13 inches.  Cardiac: Regular rate rhythm, no murmur  Musculoskeletal: No deformity .   Neurological examination   Mentation: Alert oriented to time, place, history taking.  Reports nocturnal hallucinations and delusions. Major depression.   Follows all commands speech and language fluent, appears tearful.  Cranial nerve II-XII:    Pupils were equal round reactive to light extraocular movements were full,  visual field were full on confrontational test. Facial sensation and strength were normal. hearing was intact to finger rubbing bilaterally.  Uvula tongue midline. head turning and shoulder shrug were normal and symmetric.Tongue protrusion into cheek strength was normal. Motor:  normal bulk and tone, full strength in the BUE, BLE,  No focal weakness Sensory: normal and symmetric to light touch, pinprick, and  vibration  Coordination: finger-nose-finger bilaterally, no dysmetria Reflexes:  2/2, plantar responses were flexor bilaterally. Gait and Station: Rising up from seated position without assistance, appears shaky , but has  moderate stride,  good arm swing, smooth turning, able to perform tiptoe, and heel walking without difficulty.  Tandem gait is unsteady, tremor .   DIAGNOSTIC DATA (LABS, IMAGING, TESTING) -  ASSESSMENT AND PLAN  63 y.o. year old female  has a past medical history of IBS (irritable bowel syndrome); PUD (peptic ulcer disease); Chronic pain; Parotid tumor; Anemia; Anxiety; Blood transfusion;  GERD (gastroesophageal reflux disease); Osteoporosis; H/O hiatal hernia; Trigeminal neuralgia; S/P radiation therapy;  Chronic kidney disease; Depression; Migraines; High cholesterol; Neuralgia; Hypersomnia; Parotid tumor; and Ulcer. For followup. She is a patient of mine ( Dr. Brett Fairy) , and has seen   This patient needs psychiatric care, I encouraged the hospice group for grief.  This visit was 45 minutes in duration, with  depression and insomnia counseling.  Concerned about the extend of insomnia and medication taken . I don't mind refilling the imitrex.   Charlott Holler NP and Cecille Rubin, NP   F/U in 56months with NP -[facility] [address] [place], [place] [address] [phone]

## 2014-03-28 ENCOUNTER — Telehealth: Payer: Self-pay | Admitting: Neurology

## 2014-03-28 DIAGNOSIS — F329 Major depressive disorder, single episode, unspecified: Secondary | ICD-10-CM

## 2014-03-28 DIAGNOSIS — F19982 Other psychoactive substance use, unspecified with psychoactive substance-induced sleep disorder: Secondary | ICD-10-CM

## 2014-03-28 DIAGNOSIS — F32A Depression, unspecified: Secondary | ICD-10-CM

## 2014-03-28 DIAGNOSIS — G43701 Chronic migraine without aura, not intractable, with status migrainosus: Secondary | ICD-10-CM

## 2014-03-28 DIAGNOSIS — R270 Ataxia, unspecified: Secondary | ICD-10-CM

## 2014-03-28 MED ORDER — CITALOPRAM HYDROBROMIDE 20 MG PO TABS: ORAL_TABLET | ORAL | Status: DC

## 2014-03-28 NOTE — Telephone Encounter (Signed)
Patient requesting if she could switch clonazePAM (KLONOPIN) 1 MG tablet to Xanax 5 mg.  Please call anytime and may leave detailed message if not available.

## 2014-03-28 NOTE — Telephone Encounter (Signed)
Patient says she was given a Rx for Klonopin at [facility] on 08/24, but would prefer to take Xanax instead.  She would like to know if Dr Brett Fairy would be willing to change the Rx.  Please advise.  Thank you.

## 2014-03-28 NOTE — Telephone Encounter (Signed)
There is no xanax 5 mg, but 0.5 or 1 mg .   Called patient :  I will be happy to change this medication if she she will in return destroy the Klonipin refill.   I am concerned that her acute  grief and depression could lead to an overdose. She should use a higher dose of Citalopram and see on Tuesday with her psychiatrist that she now wiling to take the Abilify ( her psychiatrists recommendation )  CD

## 2014-03-29 DIAGNOSIS — R51 Headache: Secondary | ICD-10-CM

## 2014-03-30 ENCOUNTER — Ambulatory Visit
Admission: RE | Admit: 2014-03-30 | Discharge: 2014-03-30 | Disposition: A | Discharge status: Home / Self Care | Payer: Medicare Other | Source: Ambulatory Visit | Attending: Neurology | Admitting: Neurology

## 2014-03-30 DIAGNOSIS — F329 Major depressive disorder, single episode, unspecified: Secondary | ICD-10-CM

## 2014-03-30 DIAGNOSIS — R296 Repeated falls: Secondary | ICD-10-CM

## 2014-03-30 DIAGNOSIS — F32A Depression, unspecified: Secondary | ICD-10-CM

## 2014-03-30 DIAGNOSIS — R270 Ataxia, unspecified: Secondary | ICD-10-CM

## 2014-03-30 DIAGNOSIS — F19982 Other psychoactive substance use, unspecified with psychoactive substance-induced sleep disorder: Secondary | ICD-10-CM

## 2014-03-30 DIAGNOSIS — G43701 Chronic migraine without aura, not intractable, with status migrainosus: Secondary | ICD-10-CM

## 2014-04-03 ENCOUNTER — Telehealth: Payer: Self-pay | Admitting: Neurology

## 2014-04-03 ENCOUNTER — Ambulatory Visit: Payer: Medicare Other | Admitting: Neurology

## 2014-04-03 NOTE — Telephone Encounter (Signed)
Wanted to leave this Message for patient Caitlin Irwin . :Gabapentin was one of the medication you felt was less helpful for your headaches.  If you restart , start low , 300 mg at night , advance form there to up to three times daily . CD  Her phone doesn't accept incoming calls !? Send a my chart message instead.Marland Kitchen

## 2014-04-03 NOTE — Telephone Encounter (Signed)
Patient requesting MRI results and questioning what Dr. Edwena Felty opinion would be concerning restarting Gabapentin?  Please call anytime and may leave detailed message on voice mail.

## 2014-04-03 NOTE — Telephone Encounter (Signed)
Patient wants to know Dr. Edwena Felty opinion on her re-starting the Gabapentin?  Please advise.

## 2014-04-04 NOTE — Telephone Encounter (Signed)
Left a detailed message with MRI results and Dr. Edwena Felty response to the patient starting back on Gabapentin.

## 2014-04-08 ENCOUNTER — Encounter: Payer: Self-pay | Admitting: Neurology

## 2014-04-08 MED ORDER — ALPRAZOLAM 1 MG PO TABS: 1.0000 mg | ORAL_TABLET | Freq: Two times a day (BID) | ORAL | Status: DC | PRN

## 2014-04-08 NOTE — Telephone Encounter (Signed)
I am OK with xanax, 1 mg  at bedtime.  And glad you are doing  better.

## 2014-04-08 NOTE — Addendum Note (Signed)
Addended by: Larey Seat on: 04/08/2014 04:33 PM   Modules accepted: Orders

## 2014-04-08 NOTE — Telephone Encounter (Signed)
I called the patient back.  Got no answer.  Left message. 

## 2014-04-08 NOTE — Telephone Encounter (Signed)
Try if xanax works better than klonopin at night with the sleeplessness. Do not mix and match -  I will prescribe 2 mg Xanac daily - for use 1 MG at night and one as needed. CD

## 2014-04-09 ENCOUNTER — Telehealth: Payer: Self-pay | Admitting: Neurology

## 2014-04-09 NOTE — Telephone Encounter (Signed)
Yes , I meant not  mix Klonopin and Xanax. Xanax for anxiety is OK and was written and faxed yesterday to your pharmacy.  Neurontin- Gabapentin is indeed mainly a trigeminal neuralgia treatment.  Np to see you this month. requested Hinton Dyer to schedule.  CD

## 2014-04-09 NOTE — Telephone Encounter (Signed)
Rx was already sent.  I called the pharmacy back.  Spoke with Barnabas Lister.  He verified they do have the Rx.

## 2014-04-09 NOTE — Telephone Encounter (Signed)
Barnabas Lister, Pharmacist at  [facility] @ [phone], calling for Rx ALPRAZolam Duanne Moron) 1 MG tablet.  Patients at pharmacy to pick up Rx.

## 2014-04-11 ENCOUNTER — Ambulatory Visit (INDEPENDENT_AMBULATORY_CARE_PROVIDER_SITE_OTHER): Payer: No Typology Code available for payment source | Admitting: Licensed Clinical Social Worker

## 2014-04-11 DIAGNOSIS — F331 Major depressive disorder, recurrent, moderate: Secondary | ICD-10-CM

## 2014-04-14 ENCOUNTER — Telehealth: Payer: Self-pay | Admitting: Neurology

## 2014-04-14 NOTE — Telephone Encounter (Signed)
Patient calling to state she is having a bad reaction to Abilify, states that she has been staying up for 27 hours at a time and her tongue and side of mouth are still very swollen, she cannot take other medications because she just throws it back up. Please call and advise.

## 2014-04-14 NOTE — Telephone Encounter (Signed)
I called pt and she relayed that she has had a reaction to abilify as per below, she is questioning if the trigeminal neuralgia is coming back, since she is having some tingling.  She has spoken to psychiatrist office about the abilify dose from 5 to 10mg  to see if makes a difference.  She is not taking anything for the neuralgia now.   She has appt with psychiatrist tomorrow and then [place] on Wednesday.  I told her that will relay message to Dr. Brett Fairy.  She has taken benadryl, this has been going on for several days.  The swelling of the tongue has caused her to bite her tongue and cause more irritation.  I told her that may need to get this taken care of first and then address the neuralgia.

## 2014-04-15 ENCOUNTER — Encounter: Payer: Self-pay | Admitting: Neurology

## 2014-04-15 NOTE — Telephone Encounter (Signed)
I have tried to reach Dr. Laurey Morale. , was 14 minutes on hold , and finally l;eft a VM at her office. CD

## 2014-04-15 NOTE — Telephone Encounter (Signed)
Patient called and was very concerned about facial swelling - the side of her TGN, and further weight loss and a hypomanic state, feeling fine, and spending money on things she didn't need.  Traded her car in and bought shoes above her limited budget.  Abilify reaction ? , Dr Dorethea Clan is prescriber and was made  aware , I would have a RV appointment with Gwenette Greet Kopper on Wednesday ( tomorrow)but it's more important to address these issues with psychiatry and possibly be regulated in a protected environment ( patient lives alone since the death of her spouse ) .  Cc Dr Dorethea Clan.  CD

## 2014-04-16 ENCOUNTER — Ambulatory Visit (INDEPENDENT_AMBULATORY_CARE_PROVIDER_SITE_OTHER): Payer: Medicare Other | Admitting: Nurse Practitioner

## 2014-04-16 ENCOUNTER — Encounter: Payer: Self-pay | Admitting: Nurse Practitioner

## 2014-04-16 VITALS — BP 151/79 | HR 99 | Ht 58.5 in | Wt 102.6 lb

## 2014-04-16 DIAGNOSIS — R270 Ataxia, unspecified: Secondary | ICD-10-CM

## 2014-04-16 DIAGNOSIS — R279 Unspecified lack of coordination: Secondary | ICD-10-CM

## 2014-04-16 DIAGNOSIS — G500 Trigeminal neuralgia: Secondary | ICD-10-CM

## 2014-04-16 DIAGNOSIS — G501 Atypical facial pain: Secondary | ICD-10-CM

## 2014-04-16 MED ORDER — GABAPENTIN 300 MG PO CAPS: 300.0000 mg | ORAL_CAPSULE | Freq: Every day | ORAL | Status: DC

## 2014-04-16 NOTE — Progress Notes (Signed)
[facility]  PATIENT: Caitlin Irwin DOB: 02/01/1951   REASON FOR VISIT: for facial pain   HISTORY OF PRESENT ILLNESS:Caitlin Irwin, 63 year old female returns for followup. She was just seen in this office 03/24/2014 by  Dr. Brett Fairy . At that time she is advised her that she needed psychiatric treatment,grief counseling with depression and insomnia being her major concerns since her husbands death.  Patient has been seeing Dr. Magda Paganini, and was prescribed Celexa and Abilify. She became hyperactive on the Abilify and has tapered off of that. She has just started on a new antidepressant yesterday Rexulti but has not started the medication. Her facial pain is worsened with her agitation. She has been off of her gabapentin for some time. She is also off of her clonazepam. She returns for reevaluation.   HISTORY: Caitlin Irwin, 63 year old female, recently widowed, returns for followup.   She had gamma knife with success however continued numbness.   She was scheduled for ablation procedure but  her husband died unexpectedly and she canceled the appointment. She is here today to get that rescheduled, it has already been approved by her insurance. She continues to bite the inside of her buccal tissue. There has been no change in her numbness. She returns for reevaluation.   She is severely depressed, and contacted hospice to join a grief counseling group. She has severe insomnia, has lost weight, and is visibly aged.   She has tried all kinds of sleep aids, none  gives her the desired effect. She likely overdosed on a mix of pills, and had a breakdown last month.   She has had multiple falls, injured her rotator cuff and back. She is stressed, her house is on the market, she can not find the things she looks for.   She cannot find certain objects out of the inheritance. 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      HISTORY CD- 0 2014 ; gamma knife surgery for facial  pain.   She has had success in the treatment of the neuralgia, but numbness remained, see letter for Dr. Salomon Fick.   He suggested a sphenopalatine ganglion block or ablation. I offered her to discuss this procedure, and this to be done here in [place] with Dr. Barbie Banner.   Caitlin Irwin is again insomnic, sleeps on Ambien only 2-3 hours , has rebound insomnia from pain.   She is biting the inside of her buccal tissue REVIEW OF SYSTEMS: Full 14 system review of systems performed and notable only for those listed, all others are neg:  Constitutional: Fatigue  Cardiovascular: N/A  Ear/Nose/Throat: N/A  Skin: N/A  Eyes: N/A  Respiratory: N/A  Gastroitestinal: N/A  Hematology/Lymphatic: Anemia  Endocrine: Heat  intolerance Musculoskeletal:N/A  Allergy/Immunology: N/A  Neurological: Facial pain Psychiatric: Depression anxiety  Sleep insomnia, daytime sleepiness     ALLERGIES: Allergies  Allergen Reactions  . Acetaminophen Nausea And Vomiting    HOME MEDICATIONS: Outpatient Prescriptions Prior to Visit  Medication Sig Dispense Refill  . ALPRAZolam (XANAX) 1 MG tablet Take 1 tablet (1 mg total) by mouth 2 (two) times daily as needed for anxiety.  60 tablet  0  . citalopram (CELEXA) 20 MG tablet Take one a day po.  30 tablet  5  . DimenhyDRINATE (DRAMAMINE FOR KIDS) 25 MG CHEW Chew 1 tablet by mouth.      . estradiol (ESTRACE) 1 MG tablet Take 1 mg by mouth daily.       Marland Kitchen [place]  Take 1 tablet by mouth 2 (two) times daily.       Marland Kitchen levothyroxine (SYNTHROID, LEVOTHROID) 75 MCG tablet Take 75 mcg by mouth daily before breakfast.       . methadone (DOLOPHINE) 5 MG tablet Take 10 mg by mouth at bedtime as needed for moderate pain.       Marland Kitchen ondansetron (ZOFRAN) 4 MG tablet       . pantoprazole (PROTONIX) 40 MG tablet Take 40 mg by mouth 2 (two) times daily.       Marland Kitchen spironolactone (ALDACTONE) 100 MG tablet Take 100 mg by mouth daily before breakfast.       . SUMAtriptan (IMITREX) 100  MG tablet May repeat to take 100 mg in 2 hours if headache persists or recurs.  45 tablet  0  . valACYclovir (VALTREX) 500 MG tablet Take 500 mg by mouth daily.      . clonazePAM (KLONOPIN) 1 MG tablet Take 2 tablets (2 mg total) by mouth at bedtime.  30 tablet  0  . topiramate (TOPAMAX) 25 MG tablet       . traZODone (DESYREL) 100 MG tablet        No facility-administered medications prior to visit.    PAST MEDICAL HISTORY: Past Medical History  Diagnosis Date  . Other constipation   . Unspecified hypothyroidism   . IBS (irritable bowel syndrome)   . PUD (peptic ulcer disease)   . Chronic pain     scoliosis  . Parotid tumor   . Anemia   . Anxiety   . Blood transfusion   . Cataract   . GERD (gastroesophageal reflux disease)   . Osteoporosis   . H/O hiatal hernia   . Trigeminal neuralgia   . S/P radiation therapy   . PONV (postoperative nausea and vomiting)     hx of " getting too much" - 1998, slow to take up by body   . Chronic kidney disease     hx of uti   . Depression   . Migraines   . High cholesterol   . 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 Hypersomnia   . Parotid tumor   . Ulcer   . Ataxia 03/24/2014    PAST SURGICAL HISTORY: Past Surgical History  Procedure Laterality Date  . Cholecystectomy    . Abdominal hysterectomy    . Right knee surgery    . L5 surgery    . Heary surgery      correct hole in the heart  . Left ankle    . Back surgery    . Left shoulder surgery     . Cut muscles and nerves in head       to stop migraines and it did not work   . Heart outpaitent surgery       to correct hole in heart   . Brain surgery      parotid tumor removed   . Esophagogastroduodenoscopy  06/12/2012    Procedure: ESOPHAGOGASTRODUODENOSCOPY (EGD);  Surgeon: Lear Ng, MD;  Location: [facility];  Service: Endoscopy;  Laterality: N/A;  . Gamma knife for trig neuralgia  12/27/11  . Parotio gland      mass on gland removed, benign  .  Mass excision  2012    for degenerative disk disease    FAMILY HISTORY: Family History  Problem Relation Age of Onset  . Ovarian cancer Mother   . Migraines Mother   . Heart  disease Maternal Grandfather   . Diabetes Father   . Migraines Brother   . Heart disease Other     SOCIAL HISTORY: History   Social History  . Marital Status: Widowed    Spouse Name: N/A    Number of Children: 0  . Years of Education: college   Occupational History  . disabled    Social History Main Topics  . Smoking status: Never Smoker   . Smokeless tobacco: Never Used  . Alcohol Use: No  . Drug Use: No  . Sexual Activity: Not on file   Other Topics Concern  . Not on file   Social History Narrative   Patient lives at home alone.    Patient husband past away 07-03-13.   Patient is widowed.   Patient is on disability.    Patient is right handed.   Patient has no children.   Patient is a college grad.     PHYSICAL EXAM  Filed Vitals:   04/16/14 1449  BP: 151/79  Pulse: 99  Height: 4' 10.5" (1.486 m)  Weight: 102 lb 9.6 oz (46.539 kg)   Body mass index is 21.08 kg/(m^2). Generalized: thin , has aged, in no acute distress    Head: normocephalic and atraumatic, Oropharynx sores in the buccal and gum tissue, tongue is dry bite marks in the buccal tissue.   Neck: Supple, no carotid bruits , 13 inches.   Cardiac: Regular rate rhythm, no murmur   Musculoskeletal: No deformity .  Neurological examination  Mentation: Alert oriented to time, place, history taking. Reports  Major depression is better Follows all commands speech and language fluent,   Cranial nerve II-XII:   Pupils were equal round reactive to light extraocular movements were full,  visual field were full on confrontational test. Facial sensation and strength were normal. hearing was intact to finger rubbing bilaterally.  Uvula tongue midline. head turning and shoulder shrug were normal and symmetric.Tongue protrusion into  cheek strength was normal. Motor:  normal bulk and tone, full strength in the BUE, BLE,  No focal weakness Sensory: normal and symmetric to light touch, pinprick, and  vibration   Coordination: finger-nose-finger bilaterally, no dysmetria Reflexes:  2/2, plantar responses were flexor bilaterally. Gait and Station: Rising up from seated position without assistance,  has  moderate stride,  good arm swing, smooth turning, able to perform tiptoe, and heel walking without difficulty.  Tandem gait is mildly unsteady, no assistive device     DIAGNOSTIC DATA (LABS, IMAGING, TESTING) - I reviewed patient records, labs, notes, testing and imaging myself where available.  Lab Results  Component Value Date   WBC 14.8* 10/12/2013   HGB 10.6* 10/12/2013   HCT 32.9* 10/12/2013   MCV 79.7 10/12/2013   PLT 338 10/12/2013      Component Value Date/Time   NA 142 10/12/2013 2035   K 3.7 10/12/2013 2035   CL 104 10/12/2013 2035   CO2 25 10/12/2013 2035   GLUCOSE 105* 10/12/2013 2035   BUN 17 10/12/2013 2035   CREATININE 1.12* 10/12/2013 2035   CALCIUM 9.6 10/12/2013 2035   PROT 7.0 10/12/2013 2035   ALBUMIN 4.4 10/12/2013 2035   AST 29 10/12/2013 2035   ALT 12 10/12/2013 2035   ALKPHOS 58 10/12/2013 2035   BILITOT 0.2* 10/12/2013 2035   GFRNONAA 51* 10/12/2013 2035   GFRAA 68* 10/12/2013 2035  ASSESSMENT AND PLAN 63 y.o. year old female  has a past medical history of IBS (irritable bowel syndrome);  Chronic pain;  Anxiety;  Trigeminal neuralgia; S/P radiation therapy;  Chronic kidney disease; Depression; Migraines; here to followup for facial pain.   Restart gabapentin at 300 mg at bedtime only Continue Xanax when necessary  Followup in 4-6 months Dennie Bible, [facility], [facility], [facility] [address] [place], [place] [address] [phone]

## 2014-04-16 NOTE — Patient Instructions (Signed)
Restart gabapentin at 300 mg at bedtime only Continue Xanax when necessary  Followup in 4-6 months

## 2014-04-16 NOTE — Progress Notes (Signed)
I agree with the assessment and plan as directed by NP .The patient is known to me .   Avenly Roberge, MD  

## 2014-04-17 ENCOUNTER — Telehealth: Payer: Self-pay | Admitting: Neurology

## 2014-04-17 NOTE — Telephone Encounter (Signed)
Patient stated new medication REXULTI makes her very hyper.  Questioning if she could 1/2 tab of Xanax instead of 1 tab along with REXULTI.  Please return anytime and may leave detailed message if not available.

## 2014-04-17 NOTE — Telephone Encounter (Signed)
I called back.  Recommended she consult the doctor that prescribed Rexulti to see what they recommend regarding her hyperactivity on the drug, and as well ask them if she can take it at the same time as Xanax.  She was agreeable to this plan.

## 2014-04-28 ENCOUNTER — Ambulatory Visit (INDEPENDENT_AMBULATORY_CARE_PROVIDER_SITE_OTHER): Payer: No Typology Code available for payment source | Admitting: Licensed Clinical Social Worker

## 2014-04-28 ENCOUNTER — Telehealth: Payer: Self-pay | Admitting: Nurse Practitioner

## 2014-04-28 DIAGNOSIS — F331 Major depressive disorder, recurrent, moderate: Secondary | ICD-10-CM

## 2014-04-28 NOTE — Telephone Encounter (Signed)
Patient calling to state that her trigeminal neuralgia is acting up and her 300 mg neurontin is not helping, wants to know if she can increase dose to 600 mg. Please return call and advise.

## 2014-04-29 MED ORDER — GABAPENTIN 300 MG PO CAPS: 300.0000 mg | ORAL_CAPSULE | Freq: Two times a day (BID) | ORAL | Status: DC

## 2014-04-29 NOTE — Telephone Encounter (Signed)
Patient returning call to Butch Penny, please return call and advise.

## 2014-04-29 NOTE — Telephone Encounter (Signed)
Spoke to patient and relayed that she can increase Gabapentin 300mg  caps to 1 cap 2 times a day or to take 2 caps at bedtime, per CM.  Also that new dose has been called to pharmacy.  Patient verbalized understanding.

## 2014-04-29 NOTE — Telephone Encounter (Signed)
Patient was last seen 9/16 and she was having facial pain as a side effect to one of her meds for depression prescribed by psych. She can increase the med to 2 caps daily either 1 BID or 2 caps at hs.  I have faxed change to drug store. Please call the patient

## 2014-04-29 NOTE — Telephone Encounter (Signed)
Left message to find out how long her trigeminal neuralgia has been acting up and how long at the current dose of neurontin.   Will forward message to provider.

## 2014-05-05 ENCOUNTER — Other Ambulatory Visit: Payer: Self-pay | Admitting: Neurology

## 2014-05-05 NOTE — Telephone Encounter (Signed)
Rx signed and faxed.

## 2014-05-16 ENCOUNTER — Other Ambulatory Visit: Payer: Self-pay

## 2014-05-19 ENCOUNTER — Telehealth: Payer: Self-pay | Admitting: Nurse Practitioner

## 2014-05-19 MED ORDER — GABAPENTIN 300 MG PO CAPS: 300.0000 mg | ORAL_CAPSULE | Freq: Three times a day (TID) | ORAL | Status: DC

## 2014-05-19 NOTE — Telephone Encounter (Signed)
Patient is calling. She is still having major discomfort from trigeminal neuralgia. What can she do? Please call patient and it is ok to leave a message.

## 2014-05-19 NOTE — Telephone Encounter (Signed)
TC to patient may increase gabapentin to 3 times daily however I also suggest that she contact Dr. Salomon Fick and see if he has other suggestions for her since he did her surgery for trigeminal neuralgia.  She is agreeable. We will call in RX

## 2014-05-22 ENCOUNTER — Other Ambulatory Visit: Payer: Self-pay | Admitting: Gastroenterology

## 2014-05-22 DIAGNOSIS — K219 Gastro-esophageal reflux disease without esophagitis: Secondary | ICD-10-CM

## 2014-05-22 DIAGNOSIS — R1310 Dysphagia, unspecified: Secondary | ICD-10-CM

## 2014-05-23 ENCOUNTER — Other Ambulatory Visit: Payer: Self-pay | Admitting: Neurology

## 2014-05-23 ENCOUNTER — Other Ambulatory Visit: Payer: Self-pay

## 2014-05-26 ENCOUNTER — Ambulatory Visit (INDEPENDENT_AMBULATORY_CARE_PROVIDER_SITE_OTHER): Payer: No Typology Code available for payment source | Admitting: Licensed Clinical Social Worker

## 2014-05-26 DIAGNOSIS — F332 Major depressive disorder, recurrent severe without psychotic features: Secondary | ICD-10-CM

## 2014-05-30 ENCOUNTER — Telehealth: Payer: Self-pay | Admitting: Nurse Practitioner

## 2014-05-30 NOTE — Telephone Encounter (Signed)
Left message for patient regarding rescheduling 09/24/14 appointment per Carolyn's schedule.

## 2014-06-17 ENCOUNTER — Emergency Department (HOSPITAL_COMMUNITY): Payer: Medicare Other

## 2014-06-17 ENCOUNTER — Encounter (HOSPITAL_COMMUNITY): Payer: Self-pay | Admitting: Emergency Medicine

## 2014-06-17 ENCOUNTER — Emergency Department (HOSPITAL_COMMUNITY)
Admission: EM | Admit: 2014-06-17 | Discharge: 2014-06-17 | Disposition: A | Discharge status: Home / Self Care | Payer: Medicare Other | Attending: Emergency Medicine | Admitting: Emergency Medicine

## 2014-06-17 DIAGNOSIS — Z86018 Personal history of other benign neoplasm: Secondary | ICD-10-CM | POA: Insufficient documentation

## 2014-06-17 DIAGNOSIS — S065X0A Traumatic subdural hemorrhage without loss of consciousness, initial encounter: Secondary | ICD-10-CM | POA: Diagnosis not present

## 2014-06-17 DIAGNOSIS — Y92009 Unspecified place in unspecified non-institutional (private) residence as the place of occurrence of the external cause: Secondary | ICD-10-CM | POA: Insufficient documentation

## 2014-06-17 DIAGNOSIS — N189 Chronic kidney disease, unspecified: Secondary | ICD-10-CM | POA: Diagnosis not present

## 2014-06-17 DIAGNOSIS — S0990XA Unspecified injury of head, initial encounter: Secondary | ICD-10-CM | POA: Diagnosis present

## 2014-06-17 DIAGNOSIS — G43909 Migraine, unspecified, not intractable, without status migrainosus: Secondary | ICD-10-CM | POA: Insufficient documentation

## 2014-06-17 DIAGNOSIS — Z872 Personal history of diseases of the skin and subcutaneous tissue: Secondary | ICD-10-CM | POA: Insufficient documentation

## 2014-06-17 DIAGNOSIS — G8929 Other chronic pain: Secondary | ICD-10-CM | POA: Diagnosis not present

## 2014-06-17 DIAGNOSIS — Z923 Personal history of irradiation: Secondary | ICD-10-CM | POA: Insufficient documentation

## 2014-06-17 DIAGNOSIS — Z79899 Other long term (current) drug therapy: Secondary | ICD-10-CM | POA: Diagnosis not present

## 2014-06-17 DIAGNOSIS — F329 Major depressive disorder, single episode, unspecified: Secondary | ICD-10-CM | POA: Diagnosis not present

## 2014-06-17 DIAGNOSIS — F419 Anxiety disorder, unspecified: Secondary | ICD-10-CM

## 2014-06-17 DIAGNOSIS — Y998 Other external cause status: Secondary | ICD-10-CM | POA: Diagnosis not present

## 2014-06-17 DIAGNOSIS — Y9389 Activity, other specified: Secondary | ICD-10-CM | POA: Diagnosis not present

## 2014-06-17 DIAGNOSIS — S065XAA Traumatic subdural hemorrhage with loss of consciousness status unknown, initial encounter: Secondary | ICD-10-CM

## 2014-06-17 DIAGNOSIS — Z8500 Personal history of malignant neoplasm of unspecified digestive organ: Secondary | ICD-10-CM | POA: Insufficient documentation

## 2014-06-17 DIAGNOSIS — W010XXA Fall on same level from slipping, tripping and stumbling without subsequent striking against object, initial encounter: Secondary | ICD-10-CM

## 2014-06-17 DIAGNOSIS — Z862 Personal history of diseases of the blood and blood-forming organs and certain disorders involving the immune mechanism: Secondary | ICD-10-CM | POA: Insufficient documentation

## 2014-06-17 DIAGNOSIS — H269 Unspecified cataract: Secondary | ICD-10-CM | POA: Insufficient documentation

## 2014-06-17 DIAGNOSIS — K219 Gastro-esophageal reflux disease without esophagitis: Secondary | ICD-10-CM | POA: Diagnosis not present

## 2014-06-17 DIAGNOSIS — Z8711 Personal history of peptic ulcer disease: Secondary | ICD-10-CM | POA: Insufficient documentation

## 2014-06-17 DIAGNOSIS — S01511A Laceration without foreign body of lip, initial encounter: Secondary | ICD-10-CM | POA: Diagnosis not present

## 2014-06-17 DIAGNOSIS — W109XXA Fall (on) (from) unspecified stairs and steps, initial encounter: Secondary | ICD-10-CM | POA: Diagnosis not present

## 2014-06-17 DIAGNOSIS — S065X9A Traumatic subdural hemorrhage with loss of consciousness of unspecified duration, initial encounter: Secondary | ICD-10-CM

## 2014-06-17 DIAGNOSIS — E039 Hypothyroidism, unspecified: Secondary | ICD-10-CM | POA: Insufficient documentation

## 2014-06-17 MED ORDER — LORAZEPAM 1 MG PO TABS
1.0000 mg | ORAL_TABLET | Freq: Once | ORAL | Status: AC
  Administered 2014-06-17: 1 mg via ORAL
  Filled 2014-06-17: qty 1

## 2014-06-17 MED ORDER — LORAZEPAM 1 MG PO TABS: 1.0000 mg | ORAL_TABLET | Freq: Once | ORAL | Status: DC

## 2014-06-17 NOTE — ED Notes (Signed)
Pt states that she missed her last step of her staircase and fell on wood, she has a bruise on her chin and she has blood around her mouth, she doesn't complains of any other injuries, no LOC

## 2014-06-17 NOTE — ED Notes (Signed)
Pt transported to CT ?

## 2014-06-17 NOTE — ED Notes (Signed)
Lips cleaned with green swabs, Dr Lita Mains in to evaluate

## 2014-06-17 NOTE — ED Provider Notes (Signed)
CSN: [id number]     Arrival date & time 06/17/14  4098 History   First MD Initiated Contact with Patient 06/17/14 [phone]     Chief Complaint  Patient presents with  . Fall     (Consider location/radiation/quality/duration/timing/severity/associated sxs/prior Treatment) HPI  Pt is a 63yo female with hx of chronic migraines for which she goes to pain management for, presenting to ED about 1hr after pt tripped and fell after missing a step in her home. Pt fell hitting her face on hardwood floor. Denies LOC. Denies dizziness, nausea or vomiting. Denies head, neck, back, chest, abdominal, arm or leg pain. Pt states her mouth hurts the most, 8/10, aching and sore.  Reports laceration to lower lip, bleeding controlled PTA. Pt is not on blood thinners. No pain medication PTA. States she is to f/u with her pain management around 11AM this morning for her migraines.  Denies any other significant PMH.     Pt also mentions over the last several weeks she has had uncontrollable body shaking, pt states she feels jumpy like she cannot control her nerves. Pt does have hx of anxiety and currently takes Xanax.  Pt is followed by a neurologist for this medication. Denies any recent change in her medication.  Past Medical History  Diagnosis Date  . Other constipation   . Unspecified hypothyroidism   . IBS (irritable bowel syndrome)   . PUD (peptic ulcer disease)   . Chronic pain     scoliosis  . Parotid tumor   . Anemia   . Anxiety   . Blood transfusion   . Cataract   . GERD (gastroesophageal reflux disease)   . Osteoporosis   . H/O hiatal hernia   . Trigeminal neuralgia   . S/P radiation therapy   . PONV (postoperative nausea and vomiting)     hx of " getting too much" - 1998, slow to take up by body   . Chronic kidney disease     hx of uti   . Depression   . Migraines   . High cholesterol   . 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 Hypersomnia   . Parotid tumor   . Ulcer   .  Ataxia 03/24/2014   Past Surgical History  Procedure Laterality Date  . Cholecystectomy    . Abdominal hysterectomy    . Right knee surgery    . L5 surgery    . Heary surgery      correct hole in the heart  . Left ankle    . Back surgery    . Left shoulder surgery     . Cut muscles and nerves in head       to stop migraines and it did not work   . Heart outpaitent surgery       to correct hole in heart   . Brain surgery      parotid tumor removed   . Esophagogastroduodenoscopy  06/12/2012    Procedure: ESOPHAGOGASTRODUODENOSCOPY (EGD);  Surgeon: Lear Ng, MD;  Location: [facility];  Service: Endoscopy;  Laterality: N/A;  . Gamma knife for trig neuralgia  12/27/11  . Parotio gland      mass on gland removed, benign  . Mass excision  2012    for degenerative disk disease   Family History  Problem Relation Age of Onset  . Ovarian cancer Mother   . Migraines Mother   . Heart disease Maternal Grandfather   . Diabetes  Father   . Migraines Brother   . Heart disease Other    History  Substance Use Topics  . Smoking status: Never Smoker   . Smokeless tobacco: Never Used  . Alcohol Use: No   OB History    No data available     Review of Systems  HENT: Positive for mouth sores ( laceration to lower lip).        Mouth and chin pain  Gastrointestinal: Negative for nausea, vomiting, abdominal pain and diarrhea.  Musculoskeletal: Negative for back pain, neck pain and neck stiffness.  Skin: Positive for wound ( lip laceration). Negative for color change.  Neurological: Negative for dizziness, seizures, syncope, weakness, light-headedness, numbness and headaches.  All other systems reviewed and are negative.     Allergies  Acetaminophen  Home Medications   Prior to Admission medications   Medication Sig Start Date End Date Taking? Authorizing Provider  ALPRAZolam (XANAX) 1 MG tablet TAKE 1 TABLET BY MOUTH TWICE A DAY AS NEEDED FOR ANXIETY 05/05/14   Asencion Partridge  Dohmeier, MD  Brexpiprazole (REXULTI) 0.5 MG TABS Take 0.5 mg by mouth daily.    Historical Provider, MD  citalopram (CELEXA) 20 MG tablet Take one a day po. 03/28/14   Larey Seat, MD  clobetasol cream (TEMOVATE) 0.05 % 0.5 g. 04/23/14   Historical Provider, MD  clonazePAM (KLONOPIN) 1 MG tablet Take 1 mg by mouth daily. 04/01/14   Historical Provider, MD  DimenhyDRINATE (DRAMAMINE FOR KIDS) 25 MG CHEW Chew 1 tablet by mouth.    Historical Provider, MD  estradiol (ESTRACE) 1 MG tablet Take 1 mg by mouth daily.  04/21/13   Historical Provider, MD  FIBER SELECT GUMMIES PO Take 1 tablet by mouth 2 (two) times daily.     Historical Provider, MD  gabapentin (NEURONTIN) 300 MG capsule Take 1 capsule (300 mg total) by mouth 3 (three) times daily. 05/19/14   Dennie Bible, NP  levothyroxine (SYNTHROID, LEVOTHROID) 75 MCG tablet Take 75 mcg by mouth daily before breakfast.     Historical Provider, MD  methadone (DOLOPHINE) 5 MG tablet Take 10 mg by mouth at bedtime as needed for moderate pain.     Historical Provider, MD  ondansetron (ZOFRAN) 4 MG tablet  10/28/13   Historical Provider, MD  pantoprazole (PROTONIX) 40 MG tablet Take 40 mg by mouth 2 (two) times daily.     Historical Provider, MD  spironolactone (ALDACTONE) 100 MG tablet Take 100 mg by mouth daily before breakfast.     Historical Provider, MD  SUMAtriptan (IMITREX) 100 MG tablet TAKE 1 TABLET MAY REPEAT IN 2 HOURS IF HEADACHE PERSIST OR RECURES 05/23/14   Asencion Partridge Dohmeier, MD  valACYclovir (VALTREX) 500 MG tablet Take 500 mg by mouth daily.    Historical Provider, MD   BP 140/69 mmHg  Pulse 95  Temp(Src) 98.4 F (36.9 C) (Oral)  Resp 18  SpO2 97% Physical Exam  Constitutional: She is oriented to person, place, and time. She appears well-developed and well-nourished. No distress.  Pt sitting comfortably in exam bed, NAD.  HENT:  Head: Normocephalic.  Right Ear: Hearing, tympanic membrane, external ear and ear canal normal.    Left Ear: Hearing, tympanic membrane, external ear and ear canal normal.  Nose: Nose normal. No nose lacerations, sinus tenderness or nasal deformity. Right sinus exhibits no maxillary sinus tenderness and no frontal sinus tenderness. Left sinus exhibits no maxillary sinus tenderness and no frontal sinus tenderness.  Mouth/Throat: Uvula is midline, oropharynx  is clear and moist and mucous membranes are normal. No trismus in the jaw.    Lower lip laceration: 1cm, bleeding controlled. Normal dentition. No chipped or loose teeth.   Contusion to chin.  Eyes: Conjunctivae and EOM are normal. Pupils are equal, round, and reactive to light. No scleral icterus.  Neck: Normal range of motion. Neck supple.  No midline bone tenderness, no crepitus or step-offs.   Cardiovascular: Normal rate, regular rhythm and normal heart sounds.   Pulmonary/Chest: Effort normal and breath sounds normal. No respiratory distress. She has no wheezes. She has no rales. She exhibits no tenderness.  Abdominal: Soft. Bowel sounds are normal. She exhibits no distension and no mass. There is no tenderness. There is no rebound and no guarding.  Musculoskeletal: Normal range of motion.  FROM all extremities. 5/5 strength in upper and lower extremities bilaterally.   Neurological: She is alert and oriented to person, place, and time. No cranial nerve deficit. Coordination normal.  Pt alert to person, place, and time.  Speech is fluent. Normal gait.   Skin: Skin is warm and dry. She is not diaphoretic.  Nursing note and vitals reviewed.   ED Course  Procedures   LACERATION REPAIR Performed by: Noland Fordyce A. Authorized by: Gwenyth Bender Consent: Verbal consent obtained. Risks and benefits: risks, benefits and alternatives were discussed Consent given by: patient Patient identity confirmed: provided demographic data Prepped and Draped in normal sterile fashion Wound explored  Laceration Location: middle aspect of  lower lip  Laceration Length: 1cm  No Foreign Bodies seen or palpated  Anesthesia: topical  Local anesthetic:20% benzocaine   Anesthetic total: 0.25 ml  Irrigation method: syringe Amount of cleaning: standard  Skin closure: 5-0 chromic gut  Number of sutures: 1  Technique: interrupted   Patient tolerance: Patient tolerated the procedure well with no immediate complications.   Labs Review Labs Reviewed  CBC  BASIC METABOLIC PANEL  PROTIME-INR  APTT    Imaging Review Ct Head Wo Contrast  06/17/2014   ADDENDUM REPORT: 06/17/2014 07:40  ADDENDUM: Critical Value/emergent results were called by telephone at the time of interpretation on 06/17/2014 at 7:37 am to ED Webb , who verbally acknowledged these results.   Electronically Signed   By: Lars Pinks M.D.   On: 06/17/2014 07:40   06/17/2014   CLINICAL DATA:  63 year old female status post trip and fall down steps with impacted face on hardwood floor. Initial encounter.  EXAM: CT HEAD WITHOUT CONTRAST  TECHNIQUE: Contiguous axial images were obtained from the base of the skull through the vertex without intravenous contrast.  COMPARISON:  Brain MRI 03/30/2014.  Head CT 01/09/2013.  FINDINGS: Visualized orbit soft tissues are within normal limits. Visualized paranasal sinuses and mastoids are clear. No scalp hematoma identified.  Sequelae of left occipital craniectomy with metallic plate skull repair re- identified. Stable visualized osseous structures. Underlying 15 mm focus of hyperdensity abutting the posterior left cerebellum is stable, and most resemble blood products on MRI. Stable appearance of the left cerebellopontine angle. Mild left lateral cerebellar encephalomalacia. No significant posterior fossa mass effect.  Small volume subdural hematoma along the interhemispheric fissure (compare series 2, image 23 today to series 2, image 20 in 2014). No other acute intracranial hemorrhage. No supratentorial mass  effect. No ventriculomegaly. No evidence of cortically based acute infarction identified. No suspicious intracranial vascular hyperdensity.  IMPRESSION: 1. Trace subdural hematoma along the interhemispheric fissure. No associated mass effect. 2. No other acute traumatic  injury identified. 3. Chronic postoperative changes to the left posterior fossa, stable since 2014.  Electronically Signed: By: Lars Pinks M.D. On: 06/17/2014 07:35     EKG Interpretation None      MDM   Final diagnoses:  Head trauma  Fall from slip, trip, or stumble, initial encounter    Pt presenting to ED with facial injury after trip and fall at home, hitting her face on hardwood floor.  No LOC. Pt is alert and oriented x3. Family with pt states she has been acting normal since incident.  Pt is not on blood thinners, however, due to pt's age, will get CT head to ensure no intracranial bleed.   Lip laceration: cleansed and closed with dissolvable suture   CT head: trace subdural hematoma along the interhemispheric fissue. No associated mass effect. No other acute traumatic injury. Chronic postoperative changes to left posterior fossa, stable since 2014.    Discussed pt with Dr. Lita Mains who also examined pt, will consult with neurosurgery.  8:07 AM Consulted with neurosurgery who will review imaging to help determine pt's disposition  8:13 AM consulted with Dr. Hal Neer, neurosurgery, who reviewed pt's CT scan.  Pt may be safely discharged home with strict return precautions, head trauma pt education sheet provided.   Pt given ativan in ED for her reported "nerves" however, advised to continue to take her Xanax as prescribed and to discuss possible dosage or medication change with her neurologist for continued feeling of anxiety. Also advised family to monitor pt today. Family states they will stay with pt over night to ensure no worsening of pt's symptoms. Home care instructions provided. Pt and family verbalized  understanding and agreement with tx plan.      Noland Fordyce, PA-C 06/17/14 [id number]  Julianne Rice, MD 06/21/14 [phone]

## 2014-06-17 NOTE — ED Notes (Signed)
MD at bedside. EDPA ERIN PRESENT SUTURING

## 2014-06-17 NOTE — ED Notes (Signed)
Pt states she was coming down the steps this morning and she missed the last step causing her to fall  Pt states she landed on her face on the hard wood floor  Pt has bleeding to her mouth and states she hit her head  Denies LOC, neck or back pain

## 2014-06-24 ENCOUNTER — Emergency Department (HOSPITAL_COMMUNITY)
Admission: EM | Admit: 2014-06-24 | Discharge: 2014-06-24 | Disposition: A | Discharge status: Home / Self Care | Payer: Medicare Other | Attending: Emergency Medicine | Admitting: Emergency Medicine

## 2014-06-24 ENCOUNTER — Emergency Department (HOSPITAL_COMMUNITY): Payer: Medicare Other

## 2014-06-24 ENCOUNTER — Encounter (HOSPITAL_COMMUNITY): Payer: Self-pay | Admitting: Emergency Medicine

## 2014-06-24 DIAGNOSIS — Z862 Personal history of diseases of the blood and blood-forming organs and certain disorders involving the immune mechanism: Secondary | ICD-10-CM | POA: Diagnosis not present

## 2014-06-24 DIAGNOSIS — Z87440 Personal history of urinary (tract) infections: Secondary | ICD-10-CM | POA: Insufficient documentation

## 2014-06-24 DIAGNOSIS — N189 Chronic kidney disease, unspecified: Secondary | ICD-10-CM | POA: Insufficient documentation

## 2014-06-24 DIAGNOSIS — K219 Gastro-esophageal reflux disease without esophagitis: Secondary | ICD-10-CM | POA: Insufficient documentation

## 2014-06-24 DIAGNOSIS — G4710 Hypersomnia, unspecified: Secondary | ICD-10-CM | POA: Insufficient documentation

## 2014-06-24 DIAGNOSIS — F419 Anxiety disorder, unspecified: Secondary | ICD-10-CM | POA: Diagnosis not present

## 2014-06-24 DIAGNOSIS — Z8711 Personal history of peptic ulcer disease: Secondary | ICD-10-CM | POA: Diagnosis not present

## 2014-06-24 DIAGNOSIS — K5909 Other constipation: Secondary | ICD-10-CM | POA: Insufficient documentation

## 2014-06-24 DIAGNOSIS — E039 Hypothyroidism, unspecified: Secondary | ICD-10-CM | POA: Insufficient documentation

## 2014-06-24 DIAGNOSIS — F329 Major depressive disorder, single episode, unspecified: Secondary | ICD-10-CM | POA: Diagnosis not present

## 2014-06-24 DIAGNOSIS — R531 Weakness: Secondary | ICD-10-CM | POA: Diagnosis present

## 2014-06-24 DIAGNOSIS — M810 Age-related osteoporosis without current pathological fracture: Secondary | ICD-10-CM | POA: Diagnosis not present

## 2014-06-24 DIAGNOSIS — G43019 Migraine without aura, intractable, without status migrainosus: Secondary | ICD-10-CM | POA: Diagnosis not present

## 2014-06-24 DIAGNOSIS — Z79899 Other long term (current) drug therapy: Secondary | ICD-10-CM | POA: Diagnosis not present

## 2014-06-24 DIAGNOSIS — G8929 Other chronic pain: Secondary | ICD-10-CM | POA: Diagnosis not present

## 2014-06-24 LAB — CBC WITH DIFFERENTIAL/PLATELET
BASOS ABS: 0 10*3/uL (ref 0.0–0.1)
Basophils Relative: 0 % (ref 0–1)
EOS ABS: 0 10*3/uL (ref 0.0–0.7)
Eosinophils Relative: 0 % (ref 0–5)
HCT: 30.6 % — ABNORMAL LOW (ref 36.0–46.0)
HEMOGLOBIN: 9.6 g/dL — AB (ref 12.0–15.0)
Lymphocytes Relative: 10 % — ABNORMAL LOW (ref 12–46)
Lymphs Abs: 0.9 10*3/uL (ref 0.7–4.0)
MCH: 23.4 pg — AB (ref 26.0–34.0)
MCHC: 31.4 g/dL (ref 30.0–36.0)
MCV: 74.5 fL — ABNORMAL LOW (ref 78.0–100.0)
MONOS PCT: 3 % (ref 3–12)
Monocytes Absolute: 0.3 10*3/uL (ref 0.1–1.0)
NEUTROS PCT: 87 % — AB (ref 43–77)
Neutro Abs: 7.8 10*3/uL — ABNORMAL HIGH (ref 1.7–7.7)
Platelets: 460 10*3/uL — ABNORMAL HIGH (ref 150–400)
RBC: 4.11 MIL/uL (ref 3.87–5.11)
RDW: 16.9 % — AB (ref 11.5–15.5)
WBC: 9 10*3/uL (ref 4.0–10.5)

## 2014-06-24 LAB — BASIC METABOLIC PANEL
ANION GAP: 18 — AB (ref 5–15)
BUN: 15 mg/dL (ref 6–23)
CO2: 21 mEq/L (ref 19–32)
CREATININE: 0.49 mg/dL — AB (ref 0.50–1.10)
Calcium: 9.4 mg/dL (ref 8.4–10.5)
Chloride: 95 mEq/L — ABNORMAL LOW (ref 96–112)
Glucose, Bld: 98 mg/dL (ref 70–99)
Potassium: 3.9 mEq/L (ref 3.7–5.3)
Sodium: 134 mEq/L — ABNORMAL LOW (ref 137–147)

## 2014-06-24 LAB — PROTIME-INR
INR: 1.11 (ref 0.00–1.49)
Prothrombin Time: 14.5 seconds (ref 11.6–15.2)

## 2014-06-24 MED ORDER — METOCLOPRAMIDE HCL 5 MG/ML IJ SOLN
10.0000 mg | Freq: Once | INTRAMUSCULAR | Status: AC
  Administered 2014-06-24: 10 mg via INTRAVENOUS
  Filled 2014-06-24: qty 2

## 2014-06-24 MED ORDER — SUMATRIPTAN SUCCINATE 6 MG/0.5ML ~~LOC~~ SOLN
6.0000 mg | Freq: Once | SUBCUTANEOUS | Status: AC
  Administered 2014-06-24: 6 mg via SUBCUTANEOUS
  Filled 2014-06-24: qty 0.5

## 2014-06-24 MED ORDER — MORPHINE SULFATE 2 MG/ML IJ SOLN
2.0000 mg | Freq: Once | INTRAMUSCULAR | Status: AC
  Administered 2014-06-24: 2 mg via INTRAVENOUS
  Filled 2014-06-24: qty 1

## 2014-06-24 MED ORDER — MORPHINE SULFATE 2 MG/ML IJ SOLN
2.0000 mg | Freq: Once | INTRAMUSCULAR | Status: AC
  Administered 2014-06-24: 1 mg via INTRAVENOUS
  Filled 2014-06-24: qty 1

## 2014-06-24 MED ORDER — MORPHINE SULFATE 2 MG/ML IJ SOLN: 2.0000 mg | Freq: Once | INTRAMUSCULAR | Status: DC

## 2014-06-24 MED ORDER — MORPHINE SULFATE 4 MG/ML IJ SOLN
4.0000 mg | Freq: Once | INTRAMUSCULAR | Status: AC
  Administered 2014-06-24: 4 mg via INTRAVENOUS
  Filled 2014-06-24: qty 1

## 2014-06-24 NOTE — Discharge Instructions (Signed)
-  You don't have any evidence of new bleeding in your head. -Your headache is most likely due to a migraine. -Please follow up with your neurologist and pain management doctor for ongoing management.

## 2014-06-24 NOTE — ED Notes (Signed)
Patient transported to CT 

## 2014-06-24 NOTE — ED Notes (Signed)
Per EMS, pt comes from doctor's office with c/o increasing nausea and vomiting along with a severe left side headache with dizziness and blurred vision. since falling last Tuesday. Was seen here for the fall in the ED and was dx with a subdural hematoma. Pt has h/o migraines. VSS, 12 lead unremarkable. 18g IV placed left ac, 4mg  zofran given IV with no relief.

## 2014-06-24 NOTE — ED Notes (Signed)
Pt remains monitored by blood pressure, pulse ox, and 12 lead.  

## 2014-06-24 NOTE — ED Provider Notes (Signed)
CSN: [id number]     Arrival date & time 06/24/14  1145 History   First MD Initiated Contact with Patient 06/24/14 1205     Chief Complaint  Patient presents with  . Nausea  . Weakness   HPI Caitlin Irwin is a 63 year old woman with a history of migraines and anxiety presenting with worsening headaches, nausea and vomiting after being diagnosed with a subdural hematoma last week.  She was seen in the ER on 06/17/14 after missing a step coming down the stair at home and hitting her head on hardwood floor. Head CT at that time demonstrated a trace subdural hematoma along the interhemispheric fissure.  Neurosurgery was consulted and recommended discharge home with strict return precautions. Since that time, the patient has developed a worsening throbbing left sided headache that she describes as 12/10.  She also reports photophobia, dizziness, nausea and vomiting for the last 48 hours, and she says that she has vomited 5 times this morning.  She says her symptoms are similar to her migraines but more severe.  She was taking Imitrex with some relief prior to her nausea and vomiting starting.  Past Medical History  Diagnosis Date  . Other constipation   . Unspecified hypothyroidism   . IBS (irritable bowel syndrome)   . PUD (peptic ulcer disease)   . Chronic pain     scoliosis  . Parotid tumor   . Anemia   . Anxiety   . Blood transfusion   . Cataract   . GERD (gastroesophageal reflux disease)   . Osteoporosis   . H/O hiatal hernia   . Trigeminal neuralgia   . S/P radiation therapy   . PONV (postoperative nausea and vomiting)     hx of " getting too much" - 1998, slow to take up by body   . Chronic kidney disease     hx of uti   . Depression   . Migraines   . High cholesterol   . 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 Hypersomnia   . Parotid tumor   . Ulcer   . Ataxia 03/24/2014   Past Surgical History  Procedure Laterality Date  . Cholecystectomy    . Abdominal  hysterectomy    . Right knee surgery    . L5 surgery    . Heary surgery      correct hole in the heart  . Left ankle    . Back surgery    . Left shoulder surgery     . Cut muscles and nerves in head       to stop migraines and it did not work   . Heart outpaitent surgery       to correct hole in heart   . Brain surgery      parotid tumor removed   . Esophagogastroduodenoscopy  06/12/2012    Procedure: ESOPHAGOGASTRODUODENOSCOPY (EGD);  Surgeon: Lear Ng, MD;  Location: [facility];  Service: Endoscopy;  Laterality: N/A;  . Gamma knife for trig neuralgia  12/27/11  . Parotio gland      mass on gland removed, benign  . Mass excision  2012    for degenerative disk disease   Family History  Problem Relation Age of Onset  . Ovarian cancer Mother   . Migraines Mother   . Heart disease Maternal Grandfather   . Diabetes Father   . Migraines Brother   . Heart disease Other    History  Substance Use Topics  . Smoking status: Never Smoker   . Smokeless tobacco: Never Used  . Alcohol Use: No   OB History    No data available     Review of Systems  Constitutional: Negative for fever, chills, activity change and fatigue.  HENT: Negative for congestion, rhinorrhea and sore throat.   Eyes: Positive for photophobia.  Respiratory: Negative for apnea, cough and shortness of breath.   Cardiovascular: Negative for chest pain and palpitations.  Gastrointestinal: Positive for nausea and vomiting. Negative for abdominal pain, diarrhea, constipation and abdominal distention.  Genitourinary: Negative for dysuria and difficulty urinating.  Musculoskeletal: Negative for myalgias and arthralgias.  Skin: Negative for rash.  Neurological: Positive for dizziness and headaches. Negative for seizures, facial asymmetry, speech difficulty, weakness, light-headedness and numbness.      Allergies  Acetaminophen  Home Medications   Prior to Admission medications   Medication Sig  Start Date End Date Taking? Authorizing Provider  ALPRAZolam (XANAX) 1 MG tablet TAKE 1 TABLET BY MOUTH TWICE A DAY AS NEEDED FOR ANXIETY Patient not taking: Reported on 06/17/2014 05/05/14   Asencion Partridge Dohmeier, MD  ALPRAZolam Duanne Moron) 1 MG tablet Take 2 mg by mouth at bedtime.    Historical Provider, MD  citalopram (CELEXA) 20 MG tablet Take one a day po. Patient taking differently: Take 20 mg by mouth daily.  03/28/14   Asencion Partridge Dohmeier, MD  DimenhyDRINATE (DRAMAMINE FOR KIDS) 25 MG CHEW Chew 1 tablet by mouth every 6 (six) hours as needed (for nausea).     Historical Provider, MD  estradiol (ESTRACE) 1 MG tablet Take 1 mg by mouth daily.  04/21/13   Historical Provider, MD  FIBER SELECT GUMMIES PO Take 1 tablet by mouth daily.     Historical Provider, MD  levothyroxine (SYNTHROID, LEVOTHROID) 75 MCG tablet Take 75 mcg by mouth daily before breakfast.     Historical Provider, MD  methadone (DOLOPHINE) 5 MG tablet Take 5 mg by mouth daily with breakfast.     Historical Provider, MD  mirtazapine (REMERON) 15 MG tablet Take 15 mg by mouth at bedtime.    Historical Provider, MD  ondansetron (ZOFRAN) 4 MG tablet Take 4 mg by mouth every 8 (eight) hours as needed for nausea or vomiting.  10/28/13   Historical Provider, MD  Oxcarbazepine (TRILEPTAL) 300 MG tablet Take 300 mg by mouth 2 (two) times daily.    Historical Provider, MD  pantoprazole (PROTONIX) 40 MG tablet Take 40 mg by mouth 2 (two) times daily.     Historical Provider, MD  spironolactone (ALDACTONE) 100 MG tablet Take 100 mg by mouth daily before breakfast.     Historical Provider, MD  SUMAtriptan (IMITREX) 100 MG tablet TAKE 1 TABLET MAY REPEAT IN 2 HOURS IF HEADACHE PERSIST OR RECURES 05/23/14   Asencion Partridge Dohmeier, MD  valACYclovir (VALTREX) 500 MG tablet Take 500 mg by mouth daily.    Historical Provider, MD   BP 141/67 mmHg  Pulse 95  Temp(Src) 98.3 F (36.8 C) (Oral)  Resp 15  Ht 4\' 11"  (1.499 m)  Wt 100 lb (45.36 kg)  BMI 20.19 kg/m2   SpO2 99% Physical Exam  Constitutional: She is oriented to person, place, and time. She appears well-developed and well-nourished. No distress.  HENT:  Mouth/Throat: Oropharynx is clear and moist. No oropharyngeal exudate.  Bruising and abrasions on chin and left side of nose.  Eyes: Conjunctivae and EOM are normal. Pupils are equal, round, and reactive to light. No scleral  icterus.  Neck: Normal range of motion. Neck supple.  Cardiovascular: Normal rate, regular rhythm and normal heart sounds.   Pulmonary/Chest: Effort normal and breath sounds normal. No respiratory distress.  Abdominal: Soft. Bowel sounds are normal. She exhibits no distension. There is no tenderness.  Musculoskeletal: Normal range of motion. She exhibits no edema or tenderness.  Neurological: She is alert and oriented to person, place, and time. She has normal reflexes. No cranial nerve deficit. She exhibits normal muscle tone.  Skin: Skin is warm and dry.    ED Course  Procedures (including critical care time) Labs Review Labs Reviewed  CBC WITH DIFFERENTIAL - Abnormal; Notable for the following:    Hemoglobin 9.6 (*)    HCT 30.6 (*)    MCV 74.5 (*)    MCH 23.4 (*)    RDW 16.9 (*)    Platelets 460 (*)    Neutrophils Relative % 87 (*)    Neutro Abs 7.8 (*)    Lymphocytes Relative 10 (*)    All other components within normal limits  BASIC METABOLIC PANEL - Abnormal; Notable for the following:    Sodium 134 (*)    Chloride 95 (*)    Creatinine, Ser 0.49 (*)    Anion gap 18 (*)    All other components within normal limits  PROTIME-INR    Imaging Review Ct Head Wo Contrast  06/24/2014   CLINICAL DATA:  Nausea and weakness, recent fall  EXAM: CT HEAD WITHOUT CONTRAST  TECHNIQUE: Contiguous axial images were obtained from the base of the skull through the vertex without intravenous contrast.  COMPARISON:  06/17/2014  FINDINGS: Stable brain atrophy without acute intracranial hemorrhage, definite infarction,  midline shift, herniation, hydrocephalus. Cisterns patent. Cerebellar atrophy as well. No extra-axial fluid collection. Normal gray-white matter differentiation. No focal mass effect or edema. Left occipital craniectomy defect with metallic plate. Underlying surgical site stable 15 mm hyperdensity as before. No surrounding edema or significant mass effect. No other skull abnormality. Mastoids and sinuses clear.  IMPRESSION: No acute intracranial finding.  Stable mild atrophy and postop changes from left occipital craniectomy.   Electronically Signed   By: Daryll Brod M.D.   On: 06/24/2014 14:55     EKG Interpretation None      MDM   Final diagnoses:  Intractable migraine without aura and without status migrainosus   Concern for ongoing bleeding with worsening symptoms vs. Typical migraines.  No neurological deficits or signs of increased ICP.  Repeat CT scan and treat pain and nausea.  3:06 pm: CT shows no sign for progressive bleed.  Patient feeling slightly better after receiving morphine and Reglan, but with continued pain.  Will address with additional morphine and Imitrex then discharge home.  She follows with neurology and a pain management doctor who address her migraines, and she will follow up with them.  Arman Filter, MD 06/24/14 1534  Dorie Rank, MD 06/24/14 [phone]

## 2014-06-24 NOTE — ED Notes (Signed)
CT phoned, notified patient available for transport.

## 2014-06-29 ENCOUNTER — Encounter (HOSPITAL_COMMUNITY): Payer: Self-pay

## 2014-06-29 ENCOUNTER — Emergency Department (HOSPITAL_COMMUNITY)
Admission: EM | Admit: 2014-06-29 | Discharge: 2014-06-29 | Disposition: A | Discharge status: Home / Self Care | Payer: Medicare Other | Attending: Emergency Medicine | Admitting: Emergency Medicine

## 2014-06-29 ENCOUNTER — Emergency Department (HOSPITAL_COMMUNITY): Payer: Medicare Other

## 2014-06-29 DIAGNOSIS — Z8711 Personal history of peptic ulcer disease: Secondary | ICD-10-CM | POA: Insufficient documentation

## 2014-06-29 DIAGNOSIS — G4710 Hypersomnia, unspecified: Secondary | ICD-10-CM | POA: Insufficient documentation

## 2014-06-29 DIAGNOSIS — K5909 Other constipation: Secondary | ICD-10-CM | POA: Insufficient documentation

## 2014-06-29 DIAGNOSIS — R112 Nausea with vomiting, unspecified: Secondary | ICD-10-CM | POA: Diagnosis present

## 2014-06-29 DIAGNOSIS — D72829 Elevated white blood cell count, unspecified: Secondary | ICD-10-CM | POA: Diagnosis not present

## 2014-06-29 DIAGNOSIS — Z9049 Acquired absence of other specified parts of digestive tract: Secondary | ICD-10-CM | POA: Insufficient documentation

## 2014-06-29 DIAGNOSIS — G43909 Migraine, unspecified, not intractable, without status migrainosus: Secondary | ICD-10-CM | POA: Diagnosis not present

## 2014-06-29 DIAGNOSIS — Z8739 Personal history of other diseases of the musculoskeletal system and connective tissue: Secondary | ICD-10-CM | POA: Diagnosis not present

## 2014-06-29 DIAGNOSIS — K219 Gastro-esophageal reflux disease without esophagitis: Secondary | ICD-10-CM | POA: Insufficient documentation

## 2014-06-29 DIAGNOSIS — Z87440 Personal history of urinary (tract) infections: Secondary | ICD-10-CM | POA: Diagnosis not present

## 2014-06-29 DIAGNOSIS — G8929 Other chronic pain: Secondary | ICD-10-CM | POA: Diagnosis not present

## 2014-06-29 DIAGNOSIS — Z9889 Other specified postprocedural states: Secondary | ICD-10-CM | POA: Insufficient documentation

## 2014-06-29 DIAGNOSIS — G500 Trigeminal neuralgia: Secondary | ICD-10-CM | POA: Diagnosis not present

## 2014-06-29 DIAGNOSIS — F329 Major depressive disorder, single episode, unspecified: Secondary | ICD-10-CM | POA: Diagnosis not present

## 2014-06-29 DIAGNOSIS — Z90710 Acquired absence of both cervix and uterus: Secondary | ICD-10-CM | POA: Diagnosis not present

## 2014-06-29 DIAGNOSIS — Z923 Personal history of irradiation: Secondary | ICD-10-CM | POA: Insufficient documentation

## 2014-06-29 DIAGNOSIS — Z793 Long term (current) use of hormonal contraceptives: Secondary | ICD-10-CM | POA: Insufficient documentation

## 2014-06-29 DIAGNOSIS — E039 Hypothyroidism, unspecified: Secondary | ICD-10-CM | POA: Diagnosis not present

## 2014-06-29 DIAGNOSIS — R1110 Vomiting, unspecified: Secondary | ICD-10-CM

## 2014-06-29 DIAGNOSIS — F419 Anxiety disorder, unspecified: Secondary | ICD-10-CM | POA: Diagnosis not present

## 2014-06-29 DIAGNOSIS — N189 Chronic kidney disease, unspecified: Secondary | ICD-10-CM | POA: Diagnosis not present

## 2014-06-29 DIAGNOSIS — Z862 Personal history of diseases of the blood and blood-forming organs and certain disorders involving the immune mechanism: Secondary | ICD-10-CM | POA: Insufficient documentation

## 2014-06-29 DIAGNOSIS — Z79899 Other long term (current) drug therapy: Secondary | ICD-10-CM | POA: Diagnosis not present

## 2014-06-29 LAB — COMPREHENSIVE METABOLIC PANEL
ALT: 14 U/L (ref 0–35)
AST: 17 U/L (ref 0–37)
Albumin: 3.8 g/dL (ref 3.5–5.2)
Alkaline Phosphatase: 75 U/L (ref 39–117)
Anion gap: 15 (ref 5–15)
BUN: 24 mg/dL — ABNORMAL HIGH (ref 6–23)
CO2: 22 mEq/L (ref 19–32)
Calcium: 9.2 mg/dL (ref 8.4–10.5)
Chloride: 104 mEq/L (ref 96–112)
Creatinine, Ser: 0.71 mg/dL (ref 0.50–1.10)
GFR calc Af Amer: >90 mL/min (ref >90)
GFR calc non Af Amer: 89 mL/min — ABNORMAL LOW (ref >90)
Glucose, Bld: 108 mg/dL — ABNORMAL HIGH (ref 70–99)
Potassium: 3.3 mEq/L — ABNORMAL LOW (ref 3.7–5.3)
Sodium: 141 mEq/L (ref 137–147)
Total Bilirubin: <0.2 mg/dL — ABNORMAL LOW (ref 0.3–1.2)
Total Protein: 7 g/dL (ref 6.0–8.3)

## 2014-06-29 LAB — URINALYSIS, ROUTINE W REFLEX MICROSCOPIC
Bilirubin Urine: NEGATIVE
GLUCOSE, UA: NEGATIVE mg/dL
HGB URINE DIPSTICK: NEGATIVE
Ketones, ur: NEGATIVE mg/dL
Leukocytes, UA: NEGATIVE
Nitrite: NEGATIVE
Protein, ur: NEGATIVE mg/dL
SPECIFIC GRAVITY, URINE: 1.021 (ref 1.005–1.030)
Urobilinogen, UA: 0.2 mg/dL (ref 0.0–1.0)
pH: 6 (ref 5.0–8.0)

## 2014-06-29 LAB — CBC WITH DIFFERENTIAL/PLATELET
Basophils Absolute: 0 10*3/uL (ref 0.0–0.1)
Basophils Relative: 0 % (ref 0–1)
Eosinophils Absolute: 0 10*3/uL (ref 0.0–0.7)
Eosinophils Relative: 0 % (ref 0–5)
HCT: 32.8 % — ABNORMAL LOW (ref 36.0–46.0)
Hemoglobin: 10 g/dL — ABNORMAL LOW (ref 12.0–15.0)
Lymphocytes Relative: 5 % — ABNORMAL LOW (ref 12–46)
Lymphs Abs: 0.9 10*3/uL (ref 0.7–4.0)
MCH: 23.5 pg — ABNORMAL LOW (ref 26.0–34.0)
MCHC: 30.5 g/dL (ref 30.0–36.0)
MCV: 77 fL — ABNORMAL LOW (ref 78.0–100.0)
Monocytes Absolute: 0.7 10*3/uL (ref 0.1–1.0)
Monocytes Relative: 4 % (ref 3–12)
Neutro Abs: 15.5 10*3/uL — ABNORMAL HIGH (ref 1.7–7.7)
Neutrophils Relative %: 91 % — ABNORMAL HIGH (ref 43–77)
Platelets: 396 10*3/uL (ref 150–400)
RBC: 4.26 MIL/uL (ref 3.87–5.11)
RDW: 17.7 % — ABNORMAL HIGH (ref 11.5–15.5)
WBC: 17.1 10*3/uL — ABNORMAL HIGH (ref 4.0–10.5)

## 2014-06-29 LAB — LIPASE, BLOOD: Lipase: 35 U/L (ref 11–59)

## 2014-06-29 MED ORDER — DIAZEPAM 2 MG PO TABS: 2.0000 mg | ORAL_TABLET | Freq: Once | ORAL | Status: DC

## 2014-06-29 MED ORDER — DIAZEPAM 5 MG PO TABS
5.0000 mg | ORAL_TABLET | Freq: Once | ORAL | Status: AC
  Administered 2014-06-29: 5 mg via ORAL
  Filled 2014-06-29: qty 1

## 2014-06-29 MED ORDER — ONDANSETRON HCL 4 MG/2ML IJ SOLN
4.0000 mg | Freq: Once | INTRAMUSCULAR | Status: AC
  Administered 2014-06-29: 4 mg via INTRAVENOUS
  Filled 2014-06-29: qty 2

## 2014-06-29 MED ORDER — ONDANSETRON HCL 4 MG PO TABS: 4.0000 mg | ORAL_TABLET | Freq: Four times a day (QID) | ORAL | Status: DC

## 2014-06-29 MED ORDER — SODIUM CHLORIDE 0.9 % IV BOLUS (SEPSIS)
1000.0000 mL | Freq: Once | INTRAVENOUS | Status: AC
  Administered 2014-06-29: 1000 mL via INTRAVENOUS

## 2014-06-29 MED ORDER — DIAZEPAM 5 MG PO TABS: 5.0000 mg | ORAL_TABLET | Freq: Four times a day (QID) | ORAL | Status: DC | PRN

## 2014-06-29 NOTE — ED Notes (Addendum)
Pt from home c/o nausea/emesis x1 day. Pt adds that she has had generalized weakness x1 week. Pt was seen at this facility x1 week ago and dx with subdural after a fall, but d/c home. Pt denies CP/SOB. Pt is A&O and in NAD. Pt has hx of anxiety. Pt received 8mg  Zofran IV en route

## 2014-06-29 NOTE — ED Provider Notes (Signed)
CSN: [id number]     Arrival date & time 06/29/14  1348 History   First MD Initiated Contact with Patient 06/29/14 1507     Chief Complaint  Patient presents with  . Nausea  . Emesis     (Consider location/radiation/quality/duration/timing/severity/associated sxs/prior Treatment) HPI Comments: Patient is a 63 year old female with history of recent subdural hematoma after fall, hypothryoidism, PUD, anemia, trigeminal neuralgia, hyperlipidemia, parotid tumor who presents to the emergency department for evaluation of nausea and vomiting. Initially she fell and hit her head on 11/17 and was diagnosed with subdural hematoma. Neurosurgery was consulted and she was felt this was nonsurgical and patient was safe for discharge home. Patient returned to ED on 11/24 for evaluation of headache. She was reimaged and found to have no signs of progressive bleed. SHe felt improved prior to discharge. She was feeling well until this afternoon when she was sitting on her couch. She tried to go upstairs to take a shower, but began to feel generally very weak. She did not make it up the stair and was able to sit back on her couch when she began vomiting. She has had persistent nausea and vomiting which is what prompted her to come to the ED. Currently she has no headache or pain anywhere.   Patient is a 63 y.o. female presenting with vomiting. The history is provided by the patient. No language interpreter was used.  Emesis Associated symptoms: no abdominal pain, no chills, no diarrhea and no headaches     Past Medical History  Diagnosis Date  . Other constipation   . Unspecified hypothyroidism   . IBS (irritable bowel syndrome)   . PUD (peptic ulcer disease)   . Chronic pain     scoliosis  . Parotid tumor   . Anemia   . Anxiety   . Blood transfusion   . Cataract   . GERD (gastroesophageal reflux disease)   . Osteoporosis   . H/O hiatal hernia   . Trigeminal neuralgia   . S/P radiation therapy   .  PONV (postoperative nausea and vomiting)     hx of " getting too much" - 1998, slow to take up by body   . Chronic kidney disease     hx of uti   . Depression   . Migraines   . High cholesterol   . 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 Hypersomnia   . Parotid tumor   . Ulcer   . Ataxia 03/24/2014   Past Surgical History  Procedure Laterality Date  . Cholecystectomy    . Abdominal hysterectomy    . Right knee surgery    . L5 surgery    . Heary surgery      correct hole in the heart  . Left ankle    . Back surgery    . Left shoulder surgery     . Cut muscles and nerves in head       to stop migraines and it did not work   . Heart outpaitent surgery       to correct hole in heart   . Brain surgery      parotid tumor removed   . Esophagogastroduodenoscopy  06/12/2012    Procedure: ESOPHAGOGASTRODUODENOSCOPY (EGD);  Surgeon: Lear Ng, MD;  Location: [facility];  Service: Endoscopy;  Laterality: N/A;  . Gamma knife for trig neuralgia  12/27/11  . Parotio gland      mass on gland  removed, benign  . Mass excision  2012    for degenerative disk disease   Family History  Problem Relation Age of Onset  . Ovarian cancer Mother   . Migraines Mother   . Heart disease Maternal Grandfather   . Diabetes Father   . Migraines Brother   . Heart disease Other    History  Substance Use Topics  . Smoking status: Never Smoker   . Smokeless tobacco: Never Used  . Alcohol Use: No   OB History    No data available     Review of Systems  Constitutional: Negative for fever and chills.  Respiratory: Negative for shortness of breath.   Cardiovascular: Negative for chest pain.  Gastrointestinal: Positive for nausea and vomiting. Negative for abdominal pain and diarrhea.  Neurological: Positive for light-headedness. Negative for headaches.  All other systems reviewed and are negative.     Allergies  Acetaminophen  Home Medications   Prior to Admission  medications   Medication Sig Start Date End Date Taking? Authorizing Provider  ALPRAZolam (XANAX) 1 MG tablet TAKE 1 TABLET BY MOUTH TWICE A DAY AS NEEDED FOR ANXIETY Patient not taking: Reported on 06/17/2014 05/05/14   Asencion Partridge Dohmeier, MD  ALPRAZolam Duanne Moron) 1 MG tablet Take 2 mg by mouth at bedtime.    Historical Provider, MD  citalopram (CELEXA) 20 MG tablet Take one a day po. Patient taking differently: Take 20 mg by mouth daily.  03/28/14   Asencion Partridge Dohmeier, MD  DimenhyDRINATE (DRAMAMINE FOR KIDS) 25 MG CHEW Chew 1 tablet by mouth every 6 (six) hours as needed (for nausea).     Historical Provider, MD  estradiol (ESTRACE) 1 MG tablet Take 1 mg by mouth daily.  04/21/13   Historical Provider, MD  FIBER SELECT GUMMIES PO Take 1 tablet by mouth daily.     Historical Provider, MD  levothyroxine (SYNTHROID, LEVOTHROID) 75 MCG tablet Take 75 mcg by mouth daily before breakfast.     Historical Provider, MD  methadone (DOLOPHINE) 5 MG tablet Take 5 mg by mouth daily with breakfast.     Historical Provider, MD  mirtazapine (REMERON) 15 MG tablet Take 15 mg by mouth at bedtime.    Historical Provider, MD  ondansetron (ZOFRAN) 4 MG tablet Take 4 mg by mouth every 8 (eight) hours as needed for nausea or vomiting.  10/28/13   Historical Provider, MD  Oxcarbazepine (TRILEPTAL) 300 MG tablet Take 300 mg by mouth 2 (two) times daily.    Historical Provider, MD  pantoprazole (PROTONIX) 40 MG tablet Take 40 mg by mouth 2 (two) times daily.     Historical Provider, MD  spironolactone (ALDACTONE) 100 MG tablet Take 100 mg by mouth daily before breakfast.     Historical Provider, MD  SUMAtriptan (IMITREX) 100 MG tablet TAKE 1 TABLET MAY REPEAT IN 2 HOURS IF HEADACHE PERSIST OR RECURES 05/23/14   Asencion Partridge Dohmeier, MD  valACYclovir (VALTREX) 500 MG tablet Take 500 mg by mouth daily.    Historical Provider, MD   BP 143/103 mmHg  Pulse 59  Temp(Src) 97.5 F (36.4 C) (Oral)  Resp 14  Ht 4\' 11"  (1.499 m)  Wt 95 lb  (43.092 kg)  BMI 19.18 kg/m2  SpO2 97% Physical Exam  Constitutional: She is oriented to person, place, and time. She appears well-developed and well-nourished. No distress.  frail  HENT:  Head: Normocephalic and atraumatic.  Right Ear: External ear normal.  Left Ear: External ear normal.  Nose: Nose normal.  Mouth/Throat: Oropharynx is clear and moist.  Eyes: Conjunctivae and EOM are normal. Pupils are equal, round, and reactive to light.  Neck: Normal range of motion.  No nuchal rigidity or meningeal signs  Cardiovascular: Normal rate, regular rhythm, normal heart sounds, intact distal pulses and normal pulses.   Pulses:      Radial pulses are 2+ on the right side, and 2+ on the left side.       Posterior tibial pulses are 2+ on the right side, and 2+ on the left side.  Pulmonary/Chest: Effort normal and breath sounds normal. No stridor. No respiratory distress. She has no wheezes. She has no rales.  Abdominal: Soft. She exhibits no distension. There is no tenderness. There is no rigidity, no rebound and no guarding.  Musculoskeletal: Normal range of motion.  Moves all extremities without guarding or ataxia  Neurological: She is alert and oriented to person, place, and time. She has normal strength. No sensory deficit. Coordination and gait normal. GCS eye subscore is 4. GCS verbal subscore is 5. GCS motor subscore is 6.  Finger nose finger normal, rapid alternating movements normal, heel knee shin normal Grip strength 5/5 bilaterally Strength 5/5 in all extremities No pronator drift Normal gait  Skin: Skin is warm and dry. She is not diaphoretic. No erythema.  Psychiatric: She has a normal mood and affect. Her behavior is normal.  Nursing note and vitals reviewed.   ED Course  Procedures (including critical care time) Labs Review Labs Reviewed  CBC WITH DIFFERENTIAL - Abnormal; Notable for the following:    WBC 17.1 (*)    Hemoglobin 10.0 (*)    HCT 32.8 (*)    MCV 77.0  (*)    MCH 23.5 (*)    RDW 17.7 (*)    Neutrophils Relative % 91 (*)    Neutro Abs 15.5 (*)    Lymphocytes Relative 5 (*)    All other components within normal limits  COMPREHENSIVE METABOLIC PANEL - Abnormal; Notable for the following:    Potassium 3.3 (*)    Glucose, Bld 108 (*)    BUN 24 (*)    Total Bilirubin <0.2 (*)    GFR calc non Af Amer 89 (*)    All other components within normal limits  LIPASE, BLOOD  URINALYSIS, ROUTINE W REFLEX MICROSCOPIC    Imaging Review Ct Head Wo Contrast  06/29/2014   CLINICAL DATA:  Nausea and vomiting.  Generalized weakness.  EXAM: CT HEAD WITHOUT CONTRAST  TECHNIQUE: Contiguous axial images were obtained from the base of the skull through the vertex without intravenous contrast.  COMPARISON:  CT scan of June 24, 2014.  FINDINGS: Status post left occipital craniotomy. Otherwise bony calvarium appears intact. Mild diffuse cortical atrophy is noted. Minimal chronic ischemic white matter disease is noted. Stable large hyperdensity seen near left occipital craniotomy defect. No mass effect or midline shift is noted. Ventricular size is within normal limits. There is no evidence of mass lesion, hemorrhage or acute infarction.  IMPRESSION: Mild diffuse cortical atrophy. Minimal chronic ischemic white matter disease. Status post left occipital craniotomy. No acute intracranial abnormality seen.   Electronically Signed   By: Sabino Dick M.D.   On: 06/29/2014 16:48     EKG Interpretation None      MDM   Final diagnoses:  Vomiting  Leukocytosis   Patient presents to ED for evaluation of persistent nausea and vomiting. Recent history of subdural hematoma. No evidence of rebleed today. Labs show  leukocytosis with WBC count of 17.1. Labs are otherwise unremarkable. Possibly this is due to vomiting today. Patient feels improved after IVF and zofran. Ambulated in ED. Tolerated coke and saltines. She has a friend who will pick her up and stay with her  tonight. She will follow up with PCP tomorrow. Discussed strict return instructions with patient who voices understanding. Patient appears reliable for follow up. Vital signs stable for discharge. Dr. Doy Mince evaluated patient and agrees with plan. Patient / Family / Caregiver informed of clinical course, understand medical decision-making process, and agree with plan.   Elwyn Lade, PA-C 06/30/14 1757  Artis Delay, MD 07/01/14 [phone]

## 2014-06-29 NOTE — ED Notes (Signed)
Awake. Verbally responsive. A/O x4. Resp even and unlabored. No audible adventitious breath sounds noted. ABC's intact. MAEE.

## 2014-06-29 NOTE — Discharge Instructions (Signed)

## 2014-06-30 ENCOUNTER — Ambulatory Visit: Payer: No Typology Code available for payment source | Admitting: Licensed Clinical Social Worker

## 2014-07-14 ENCOUNTER — Telehealth: Payer: Self-pay | Admitting: Neurology

## 2014-07-14 NOTE — Telephone Encounter (Signed)
Per Dr Edwena Felty note on 08/28, the patient's dose is 20mg , however, it was recommended she follow up with psych.  We have not prescribed 10mg .  I called the patient back.  Recommended she follow up with prescribing physician.  She has agreed and will contact Dr Magda Paganini.

## 2014-07-14 NOTE — Telephone Encounter (Signed)
Patient stated she picked up two Rx from Pharmacy for citalopram (CELEXA) 10 MG tablet.  One Rx is for 10 mg and second Rx is 20 mg.  Questioning which one does she take.  Please call and advise.

## 2014-07-28 ENCOUNTER — Ambulatory Visit: Payer: No Typology Code available for payment source | Admitting: Licensed Clinical Social Worker

## 2014-08-04 ENCOUNTER — Ambulatory Visit (INDEPENDENT_AMBULATORY_CARE_PROVIDER_SITE_OTHER): Payer: PPO | Admitting: Licensed Clinical Social Worker

## 2014-08-04 DIAGNOSIS — F332 Major depressive disorder, recurrent severe without psychotic features: Secondary | ICD-10-CM

## 2014-08-27 ENCOUNTER — Emergency Department (HOSPITAL_COMMUNITY): Payer: PPO

## 2014-08-27 ENCOUNTER — Observation Stay (HOSPITAL_COMMUNITY)
Admission: EM | Admit: 2014-08-27 | Discharge: 2014-08-29 | Disposition: A | Discharge status: Home / Self Care | Payer: PPO | Attending: Internal Medicine | Admitting: Internal Medicine

## 2014-08-27 ENCOUNTER — Encounter (HOSPITAL_COMMUNITY): Payer: Self-pay | Admitting: Emergency Medicine

## 2014-08-27 DIAGNOSIS — G500 Trigeminal neuralgia: Secondary | ICD-10-CM | POA: Insufficient documentation

## 2014-08-27 DIAGNOSIS — E876 Hypokalemia: Secondary | ICD-10-CM | POA: Diagnosis present

## 2014-08-27 DIAGNOSIS — E780 Pure hypercholesterolemia: Secondary | ICD-10-CM | POA: Diagnosis not present

## 2014-08-27 DIAGNOSIS — K589 Irritable bowel syndrome without diarrhea: Secondary | ICD-10-CM | POA: Diagnosis not present

## 2014-08-27 DIAGNOSIS — G4710 Hypersomnia, unspecified: Secondary | ICD-10-CM | POA: Diagnosis not present

## 2014-08-27 DIAGNOSIS — Y9289 Other specified places as the place of occurrence of the external cause: Secondary | ICD-10-CM | POA: Insufficient documentation

## 2014-08-27 DIAGNOSIS — R112 Nausea with vomiting, unspecified: Secondary | ICD-10-CM | POA: Insufficient documentation

## 2014-08-27 DIAGNOSIS — F329 Major depressive disorder, single episode, unspecified: Secondary | ICD-10-CM | POA: Diagnosis not present

## 2014-08-27 DIAGNOSIS — K219 Gastro-esophageal reflux disease without esophagitis: Secondary | ICD-10-CM | POA: Diagnosis present

## 2014-08-27 DIAGNOSIS — G43909 Migraine, unspecified, not intractable, without status migrainosus: Secondary | ICD-10-CM | POA: Diagnosis present

## 2014-08-27 DIAGNOSIS — M419 Scoliosis, unspecified: Secondary | ICD-10-CM | POA: Insufficient documentation

## 2014-08-27 DIAGNOSIS — M810 Age-related osteoporosis without current pathological fracture: Secondary | ICD-10-CM | POA: Diagnosis not present

## 2014-08-27 DIAGNOSIS — T39011A Poisoning by aspirin, accidental (unintentional), initial encounter: Secondary | ICD-10-CM | POA: Diagnosis present

## 2014-08-27 DIAGNOSIS — E039 Hypothyroidism, unspecified: Secondary | ICD-10-CM | POA: Diagnosis not present

## 2014-08-27 DIAGNOSIS — F419 Anxiety disorder, unspecified: Secondary | ICD-10-CM | POA: Diagnosis not present

## 2014-08-27 DIAGNOSIS — Z886 Allergy status to analgesic agent status: Secondary | ICD-10-CM | POA: Insufficient documentation

## 2014-08-27 DIAGNOSIS — T39091A Poisoning by salicylates, accidental (unintentional), initial encounter: Secondary | ICD-10-CM | POA: Diagnosis present

## 2014-08-27 DIAGNOSIS — Z8711 Personal history of peptic ulcer disease: Secondary | ICD-10-CM | POA: Diagnosis not present

## 2014-08-27 DIAGNOSIS — E860 Dehydration: Secondary | ICD-10-CM | POA: Insufficient documentation

## 2014-08-27 LAB — I-STAT CHEM 8, ED
BUN: 33 mg/dL — AB (ref 6–23)
CREATININE: 1 mg/dL (ref 0.50–1.10)
Calcium, Ion: 1.09 mmol/L — ABNORMAL LOW (ref 1.13–1.30)
Chloride: 110 mmol/L (ref 96–112)
Glucose, Bld: 110 mg/dL — ABNORMAL HIGH (ref 70–99)
HCT: 35 % — ABNORMAL LOW (ref 36.0–46.0)
Hemoglobin: 11.9 g/dL — ABNORMAL LOW (ref 12.0–15.0)
Potassium: 3.1 mmol/L — ABNORMAL LOW (ref 3.5–5.1)
Sodium: 142 mmol/L (ref 135–145)
TCO2: 18 mmol/L (ref 0–100)

## 2014-08-27 MED ORDER — DEXAMETHASONE SODIUM PHOSPHATE 10 MG/ML IJ SOLN
10.0000 mg | Freq: Once | INTRAMUSCULAR | Status: AC
  Administered 2014-08-28: 10 mg via INTRAVENOUS
  Filled 2014-08-27: qty 1

## 2014-08-27 MED ORDER — SODIUM CHLORIDE 0.9 % IV BOLUS (SEPSIS)
1000.0000 mL | Freq: Once | INTRAVENOUS | Status: AC
  Administered 2014-08-27: 1000 mL via INTRAVENOUS

## 2014-08-27 MED ORDER — DIPHENHYDRAMINE HCL 50 MG/ML IJ SOLN
25.0000 mg | Freq: Once | INTRAMUSCULAR | Status: AC
  Administered 2014-08-27: 25 mg via INTRAVENOUS
  Filled 2014-08-27: qty 1

## 2014-08-27 MED ORDER — METOCLOPRAMIDE HCL 5 MG/ML IJ SOLN
10.0000 mg | Freq: Once | INTRAMUSCULAR | Status: AC
  Administered 2014-08-27: 10 mg via INTRAVENOUS
  Filled 2014-08-27: qty 2

## 2014-08-27 NOTE — ED Notes (Signed)
Pt to ED via [facility] for evaluation of a severe headache, admits headache is worse with light and sound.  Pt alert and oriented X 4 at present, neuro exam negative.  Pt admits to some nausea and vomiting due to pain.

## 2014-08-27 NOTE — ED Notes (Signed)
MD at bedside - explained to pt necessity for CT scan results before prescribing more pain medications.

## 2014-08-28 ENCOUNTER — Emergency Department (HOSPITAL_COMMUNITY): Payer: PPO

## 2014-08-28 ENCOUNTER — Encounter (HOSPITAL_COMMUNITY): Payer: Self-pay | Admitting: General Practice

## 2014-08-28 DIAGNOSIS — T39011A Poisoning by aspirin, accidental (unintentional), initial encounter: Secondary | ICD-10-CM

## 2014-08-28 DIAGNOSIS — G43909 Migraine, unspecified, not intractable, without status migrainosus: Secondary | ICD-10-CM | POA: Diagnosis present

## 2014-08-28 DIAGNOSIS — E876 Hypokalemia: Secondary | ICD-10-CM | POA: Diagnosis present

## 2014-08-28 DIAGNOSIS — G43711 Chronic migraine without aura, intractable, with status migrainosus: Secondary | ICD-10-CM

## 2014-08-28 DIAGNOSIS — T39091A Poisoning by salicylates, accidental (unintentional), initial encounter: Secondary | ICD-10-CM | POA: Diagnosis present

## 2014-08-28 LAB — I-STAT ARTERIAL BLOOD GAS, ED
Acid-base deficit: 6 mmol/L — ABNORMAL HIGH (ref 0.0–2.0)
BICARBONATE: 17.5 meq/L — AB (ref 20.0–24.0)
O2 Saturation: 98 %
PCO2 ART: 26.1 mmHg — AB (ref 35.0–45.0)
PO2 ART: 99 mmHg (ref 80.0–100.0)
TCO2: 18 mmol/L (ref 0–100)
pH, Arterial: 7.434 (ref 7.350–7.450)

## 2014-08-28 LAB — COMPREHENSIVE METABOLIC PANEL
ALK PHOS: 58 U/L (ref 39–117)
ALT: 13 U/L (ref 0–35)
ALT: 15 U/L (ref 0–35)
AST: 22 U/L (ref 0–37)
AST: 27 U/L (ref 0–37)
Albumin: 4.2 g/dL (ref 3.5–5.2)
Albumin: 3.9 g/dL (ref 3.5–5.2)
Alkaline Phosphatase: 65 U/L (ref 39–117)
Anion gap: 13 (ref 5–15)
Anion gap: 9 (ref 5–15)
BUN: 32 mg/dL — AB (ref 6–23)
BUN: 19 mg/dL (ref 6–23)
CALCIUM: 8.7 mg/dL (ref 8.4–10.5)
CHLORIDE: 107 mmol/L (ref 96–112)
CHLORIDE: 106 mmol/L (ref 96–112)
CO2: 19 mmol/L (ref 19–32)
CO2: 25 mmol/L (ref 19–32)
CREATININE: 0.81 mg/dL (ref 0.50–1.10)
Calcium: 9.2 mg/dL (ref 8.4–10.5)
Creatinine, Ser: 0.94 mg/dL (ref 0.50–1.10)
GFR calc Af Amer: 86 mL/min — ABNORMAL LOW (ref >90)
GFR calc non Af Amer: 62 mL/min — ABNORMAL LOW (ref >90)
GFR calc non Af Amer: 75 mL/min — ABNORMAL LOW (ref >90)
GFR, EST AFRICAN AMERICAN: 72 mL/min — AB (ref >90)
GLUCOSE: 118 mg/dL — AB (ref 70–99)
Glucose, Bld: 100 mg/dL — ABNORMAL HIGH (ref 70–99)
Potassium: 3.1 mmol/L — ABNORMAL LOW (ref 3.5–5.1)
Potassium: 3.1 mmol/L — ABNORMAL LOW (ref 3.5–5.1)
SODIUM: 139 mmol/L (ref 135–145)
Sodium: 140 mmol/L (ref 135–145)
TOTAL PROTEIN: 6.8 g/dL (ref 6.0–8.3)
Total Bilirubin: 0.3 mg/dL (ref 0.3–1.2)
Total Bilirubin: 0.3 mg/dL (ref 0.3–1.2)
Total Protein: 7.4 g/dL (ref 6.0–8.3)

## 2014-08-28 LAB — URINALYSIS, ROUTINE W REFLEX MICROSCOPIC
BILIRUBIN URINE: NEGATIVE
GLUCOSE, UA: NEGATIVE mg/dL
Hgb urine dipstick: NEGATIVE
Ketones, ur: NEGATIVE mg/dL
LEUKOCYTES UA: NEGATIVE
Nitrite: NEGATIVE
Protein, ur: NEGATIVE mg/dL
SPECIFIC GRAVITY, URINE: 1.016 (ref 1.005–1.030)
Urobilinogen, UA: 0.2 mg/dL (ref 0.0–1.0)
pH: 6.5 (ref 5.0–8.0)

## 2014-08-28 LAB — BLOOD GAS, ARTERIAL
ACID-BASE DEFICIT: 2.1 mmol/L — AB (ref 0.0–2.0)
Bicarbonate: 20.9 mEq/L (ref 20.0–24.0)
Drawn by: 36529
FIO2: 0.21 %
O2 Saturation: 98.3 %
PCO2 ART: 28.7 mmHg — AB (ref 35.0–45.0)
Patient temperature: 98.6
TCO2: 21.8 mmol/L (ref 0–100)
pH, Arterial: 7.476 — ABNORMAL HIGH (ref 7.350–7.450)
pO2, Arterial: 128 mmHg — ABNORMAL HIGH (ref 80.0–100.0)

## 2014-08-28 LAB — SALICYLATE LEVEL
SALICYLATE LVL: 32.3 mg/dL — AB (ref 2.8–20.0)
Salicylate Lvl: 25.5 mg/dL — ABNORMAL HIGH (ref 2.8–20.0)
Salicylate Lvl: 17.6 mg/dL (ref 2.8–20.0)
Salicylate Lvl: 15.1 mg/dL (ref 2.8–20.0)
Salicylate Lvl: 13.7 mg/dL (ref 2.8–20.0)

## 2014-08-28 LAB — CBC WITH DIFFERENTIAL/PLATELET
BASOS ABS: 0 10*3/uL (ref 0.0–0.1)
Basophils Relative: 0 % (ref 0–1)
Eosinophils Absolute: 0 10*3/uL (ref 0.0–0.7)
Eosinophils Relative: 0 % (ref 0–5)
HEMATOCRIT: 31 % — AB (ref 36.0–46.0)
Hemoglobin: 9.4 g/dL — ABNORMAL LOW (ref 12.0–15.0)
LYMPHS ABS: 1.1 10*3/uL (ref 0.7–4.0)
LYMPHS PCT: 10 % — AB (ref 12–46)
MCH: 21.9 pg — ABNORMAL LOW (ref 26.0–34.0)
MCHC: 30.3 g/dL (ref 30.0–36.0)
MCV: 72.1 fL — ABNORMAL LOW (ref 78.0–100.0)
MONO ABS: 0.9 10*3/uL (ref 0.1–1.0)
Monocytes Relative: 8 % (ref 3–12)
Neutro Abs: 8.8 10*3/uL — ABNORMAL HIGH (ref 1.7–7.7)
Neutrophils Relative %: 82 % — ABNORMAL HIGH (ref 43–77)
PLATELETS: 451 10*3/uL — AB (ref 150–400)
RBC: 4.3 MIL/uL (ref 3.87–5.11)
RDW: 18 % — ABNORMAL HIGH (ref 11.5–15.5)
WBC: 10.7 10*3/uL — ABNORMAL HIGH (ref 4.0–10.5)

## 2014-08-28 LAB — BASIC METABOLIC PANEL
Anion gap: 9 (ref 5–15)
BUN: 26 mg/dL — ABNORMAL HIGH (ref 6–23)
CALCIUM: 8.5 mg/dL (ref 8.4–10.5)
CO2: 21 mmol/L (ref 19–32)
CREATININE: 0.79 mg/dL (ref 0.50–1.10)
Chloride: 108 mmol/L (ref 96–112)
GFR calc Af Amer: >90 mL/min (ref >90)
GFR, EST NON AFRICAN AMERICAN: 86 mL/min — AB (ref >90)
Glucose, Bld: 121 mg/dL — ABNORMAL HIGH (ref 70–99)
Potassium: 3.1 mmol/L — ABNORMAL LOW (ref 3.5–5.1)
Sodium: 138 mmol/L (ref 135–145)

## 2014-08-28 LAB — GLUCOSE, CAPILLARY
GLUCOSE-CAPILLARY: 99 mg/dL (ref 70–99)
Glucose-Capillary: 115 mg/dL — ABNORMAL HIGH (ref 70–99)
Glucose-Capillary: 117 mg/dL — ABNORMAL HIGH (ref 70–99)
Glucose-Capillary: 98 mg/dL (ref 70–99)

## 2014-08-28 LAB — MAGNESIUM: Magnesium: 2.2 mg/dL (ref 1.5–2.5)

## 2014-08-28 MED ORDER — CHARCOAL ACTIVATED PO LIQD
43.0000 g | Freq: Once | ORAL | Status: AC
  Administered 2014-08-28: 43 g via ORAL
  Filled 2014-08-28: qty 240

## 2014-08-28 MED ORDER — ONDANSETRON HCL 4 MG/2ML IJ SOLN
4.0000 mg | Freq: Four times a day (QID) | INTRAMUSCULAR | Status: DC | PRN
  Administered 2014-08-28: 4 mg via INTRAVENOUS
  Filled 2014-08-28: qty 2

## 2014-08-28 MED ORDER — MORPHINE SULFATE 4 MG/ML IJ SOLN
4.0000 mg | Freq: Once | INTRAMUSCULAR | Status: AC
  Administered 2014-08-28: 4 mg via INTRAVENOUS
  Filled 2014-08-28: qty 1

## 2014-08-28 MED ORDER — CITALOPRAM HYDROBROMIDE 20 MG PO TABS
20.0000 mg | ORAL_TABLET | Freq: Every day | ORAL | Status: DC
  Administered 2014-08-28 – 2014-08-29 (×2): 20 mg via ORAL
  Filled 2014-08-28 (×2): qty 1

## 2014-08-28 MED ORDER — LACTATED RINGERS IV BOLUS (SEPSIS)
1000.0000 mL | Freq: Once | INTRAVENOUS | Status: AC
  Administered 2014-08-28: 1000 mL via INTRAVENOUS

## 2014-08-28 MED ORDER — ALPRAZOLAM 0.5 MG PO TABS
1.0000 mg | ORAL_TABLET | Freq: Two times a day (BID) | ORAL | Status: DC | PRN
  Administered 2014-08-28 – 2014-08-29 (×2): 1 mg via ORAL
  Filled 2014-08-28 (×2): qty 2

## 2014-08-28 MED ORDER — ALUM & MAG HYDROXIDE-SIMETH 200-200-20 MG/5ML PO SUSP: 30.0000 mL | Freq: Four times a day (QID) | ORAL | Status: DC | PRN

## 2014-08-28 MED ORDER — PANTOPRAZOLE SODIUM 40 MG PO TBEC
40.0000 mg | DELAYED_RELEASE_TABLET | Freq: Two times a day (BID) | ORAL | Status: DC
  Administered 2014-08-28 – 2014-08-29 (×3): 40 mg via ORAL
  Filled 2014-08-28 (×3): qty 1

## 2014-08-28 MED ORDER — MORPHINE SULFATE 4 MG/ML IJ SOLN: 4.0000 mg | Freq: Once | INTRAMUSCULAR | Status: DC

## 2014-08-28 MED ORDER — MAGNESIUM SULFATE 2 GM/50ML IV SOLN
2.0000 g | Freq: Once | INTRAVENOUS | Status: AC
  Administered 2014-08-28: 2 g via INTRAVENOUS
  Filled 2014-08-28: qty 50

## 2014-08-28 MED ORDER — KETOROLAC TROMETHAMINE 30 MG/ML IJ SOLN
30.0000 mg | Freq: Once | INTRAMUSCULAR | Status: AC
  Administered 2014-08-28: 30 mg via INTRAVENOUS
  Filled 2014-08-28: qty 1

## 2014-08-28 MED ORDER — ZOLPIDEM TARTRATE 5 MG PO TABS
5.0000 mg | ORAL_TABLET | Freq: Every evening | ORAL | Status: DC | PRN
  Administered 2014-08-28: 5 mg via ORAL
  Filled 2014-08-28: qty 1

## 2014-08-28 MED ORDER — VALACYCLOVIR HCL 500 MG PO TABS
500.0000 mg | ORAL_TABLET | Freq: Every day | ORAL | Status: DC
Start: 2014-08-28 — End: 2014-08-29
  Administered 2014-08-28 – 2014-08-29 (×2): 500 mg via ORAL
  Filled 2014-08-28 (×2): qty 1

## 2014-08-28 MED ORDER — ESTRADIOL 1 MG PO TABS
1.0000 mg | ORAL_TABLET | Freq: Every day | ORAL | Status: DC
Start: 2014-08-28 — End: 2014-08-29
  Administered 2014-08-28 – 2014-08-29 (×2): 1 mg via ORAL
  Filled 2014-08-28 (×3): qty 1

## 2014-08-28 MED ORDER — ONDANSETRON HCL 4 MG PO TABS: 4.0000 mg | ORAL_TABLET | Freq: Four times a day (QID) | ORAL | Status: DC | PRN

## 2014-08-28 MED ORDER — LORAZEPAM 2 MG/ML IJ SOLN
1.0000 mg | Freq: Once | INTRAMUSCULAR | Status: AC
  Administered 2014-08-28: 1 mg via INTRAVENOUS
  Filled 2014-08-28: qty 1

## 2014-08-28 MED ORDER — WHITE PETROLATUM GEL
Status: AC
  Administered 2014-08-28: 0.2
  Filled 2014-08-28: qty 1

## 2014-08-28 MED ORDER — SODIUM CHLORIDE 0.9 % IV SOLN
INTRAVENOUS | Status: DC
  Filled 2014-08-28 (×2): qty 1000

## 2014-08-28 MED ORDER — SODIUM CHLORIDE 0.9 % IV SOLN
INTRAVENOUS | Status: DC
  Administered 2014-08-28: 18:00:00 via INTRAVENOUS
  Filled 2014-08-28 (×2): qty 1000

## 2014-08-28 MED ORDER — POTASSIUM CHLORIDE IN NACL 40-0.9 MEQ/L-% IV SOLN
INTRAVENOUS | Status: DC
  Administered 2014-08-28: 75 mL/h via INTRAVENOUS

## 2014-08-28 MED ORDER — METOCLOPRAMIDE HCL 5 MG/ML IJ SOLN: 10.0000 mg | Freq: Three times a day (TID) | INTRAMUSCULAR | Status: DC | PRN

## 2014-08-28 MED ORDER — METHOCARBAMOL 1000 MG/10ML IJ SOLN
500.0000 mg | Freq: Four times a day (QID) | INTRAVENOUS | Status: DC | PRN
  Administered 2014-08-28 – 2014-08-29 (×2): 500 mg via INTRAVENOUS
  Filled 2014-08-28 (×5): qty 5

## 2014-08-28 MED ORDER — LEVOTHYROXINE SODIUM 75 MCG PO TABS
75.0000 ug | ORAL_TABLET | Freq: Every day | ORAL | Status: DC
  Administered 2014-08-29: 75 ug via ORAL
  Filled 2014-08-28: qty 1
  Filled 2014-08-28: qty 3
  Filled 2014-08-28: qty 1

## 2014-08-28 MED ORDER — METHADONE HCL 10 MG PO TABS
5.0000 mg | ORAL_TABLET | Freq: Every day | ORAL | Status: DC
  Filled 2014-08-28: qty 1

## 2014-08-28 MED ORDER — DOCUSATE SODIUM 100 MG PO CAPS
100.0000 mg | ORAL_CAPSULE | Freq: Two times a day (BID) | ORAL | Status: DC
  Administered 2014-08-28 – 2014-08-29 (×3): 100 mg via ORAL
  Filled 2014-08-28 (×3): qty 1

## 2014-08-28 MED ORDER — SODIUM BICARBONATE 8.4 % IV SOLN
INTRAVENOUS | Status: DC
  Administered 2014-08-28: 14:00:00 via INTRAVENOUS
  Filled 2014-08-28 (×3): qty 1000

## 2014-08-28 MED ORDER — PROCHLORPERAZINE EDISYLATE 5 MG/ML IJ SOLN
10.0000 mg | Freq: Four times a day (QID) | INTRAMUSCULAR | Status: DC | PRN
  Administered 2014-08-28: 10 mg via INTRAVENOUS
  Filled 2014-08-28 (×2): qty 2

## 2014-08-28 MED ORDER — HEPARIN SODIUM (PORCINE) 5000 UNIT/ML IJ SOLN
5000.0000 [IU] | Freq: Three times a day (TID) | INTRAMUSCULAR | Status: DC
  Administered 2014-08-28 – 2014-08-29 (×3): 5000 [IU] via SUBCUTANEOUS
  Filled 2014-08-28 (×5): qty 1

## 2014-08-28 MED ORDER — VALPROATE SODIUM 500 MG/5ML IV SOLN
500.0000 mg | Freq: Once | INTRAVENOUS | Status: AC
  Administered 2014-08-28: 500 mg via INTRAVENOUS
  Filled 2014-08-28: qty 5

## 2014-08-28 MED ORDER — SUMATRIPTAN SUCCINATE 100 MG PO TABS: 100.0000 mg | ORAL_TABLET | ORAL | Status: DC | PRN

## 2014-08-28 MED ORDER — ONDANSETRON HCL 4 MG PO TABS
4.0000 mg | ORAL_TABLET | Freq: Four times a day (QID) | ORAL | Status: DC
  Administered 2014-08-28 – 2014-08-29 (×4): 4 mg via ORAL
  Filled 2014-08-28 (×7): qty 1

## 2014-08-28 NOTE — ED Notes (Signed)
Poison Control updated on pt.'s condition , no recommendations received.

## 2014-08-28 NOTE — ED Provider Notes (Signed)
  Physical Exam  BP 114/57 mmHg  Pulse 94  Temp(Src) 98.8 F (37.1 C) (Oral)  Resp 16  SpO2 99%  Physical Exam  ED Course  Procedures  MDM   Pt signed out to me. Pt comes in with salicylate tox. Poisone control contacted, their recs appreciated and followed. Pt got NG tube ans charcoal. Her levels have improved. Bicarb is 19 on CMP. Pt has n/v - but that is present with headaches as well. Stable from tox perspective, and cleared by them at 7:00 am.  Pt still has headaches that we have unable to abort. Her neurologist is unable to see her. She doesn't feel comfortable enough to go home. She doesn't have any abortive meds. We will admit her for intractible status migrainosus.   Varney Biles, MD 08/28/14 [phone]

## 2014-08-28 NOTE — H&P (Signed)
[facility] History and Physical                                                                                    Patient Demographics  Caitlin Irwin, is a 64 y.o. female  MRN: [id number]   DOB - 12/31/1950  Admit Date - 08/27/2014  Outpatient Primary MD for the patient is GREEN, Keenan Bachelor, MD  Neurologist: Larey Seat   With History of -  Past Medical History  Diagnosis Date  . Other constipation   . Unspecified hypothyroidism   . IBS (irritable bowel syndrome)   . PUD (peptic ulcer disease)   . Chronic pain     scoliosis  . Parotid tumor   . Anemia   . Anxiety   . Blood transfusion   . Cataract   . GERD (gastroesophageal reflux disease)   . Osteoporosis   . H/O hiatal hernia   . Trigeminal neuralgia   . S/P radiation therapy   . PONV (postoperative nausea and vomiting)     hx of " getting too much" - 1998, slow to take up by body   . Chronic kidney disease     hx of uti   . Depression   . Migraines   . High cholesterol   . 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 Hypersomnia   . Parotid tumor   . Ulcer   . 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 Cholecystectomy    . Abdominal hysterectomy    . Right knee surgery    . L5 surgery    . Heary surgery      correct hole in the heart  . Left ankle    . Back surgery    . Left shoulder surgery     . Cut muscles and nerves in head       to stop migraines and it did not work   . Heart outpaitent surgery       to correct hole in heart   . Brain surgery      parotid tumor removed   . Esophagogastroduodenoscopy  06/12/2012    Procedure: ESOPHAGOGASTRODUODENOSCOPY (EGD);  Surgeon: Lear Ng, MD;  Location: [facility];  Service: Endoscopy;  Laterality: N/A;  . Gamma knife for trig neuralgia  12/27/11  . Parotio gland      mass on gland removed, benign  . Mass excision  2012    for degenerative disk disease    in for   Chief  Complaint  Patient presents with  . Headache     HPI  Caitlin Irwin  is a 64 y.o. female, with a past medical history of chronic migraine, chronic kidney disease at an (baseline creatinine 1.0), peptic ulcer disease, and trigeminal neuralgia, who presented to the emergency department with severe migraine and salicylate toxicity. The patient reports she had run out of Imitrex, methadone and several of her other medications at home. She had no transportation to get to her neurologist's office. Severe migraine started on Sunday, January 24. She found a bottle of aspirin and her  deceased husband's belongings and began taking (4) 325 mg aspirin every 4 hours. She did this until she began to dry heave and vomit afterwards she presented to the emergency department.  Her initial salicylate level was 32.6. Poison control was contacted and the patient was given activated charcoal. Her salicylate level has since decreased to 25.5.  She appears stable at this point, but still has a severe migraine despite being given Decadron, Toradol, Reglan, and Benadryl. Thus she will be admitted for observation due to salicylate toxicity and severe migraine.  Review of Systems    In addition to the HPI above,  No Fever-chills, + Severe right-sided headache, with the inability to focus her vision, and the sound of rushing water in her head No problems swallowing food or Liquids, No Chest pain, Cough, , + she was initially short of breath, but this has improved as her salicylate level has decreased No Abdominal pain,  Bowel movements are regular, No Blood in stool or Urine, No dysuria, No new skin rashes or bruises, No new joints pains-aches,  No new weakness, tingling, numbness in any extremity, No recent weight gain or loss, No polyuria, polydypsia or polyphagia, + Patient is still in a grief period over the death of her husband last year  A full 10 point Review of Systems was done, except as stated above, all  other Review of Systems were negative.   Social History History  Substance Use Topics  . Smoking status: Never Smoker   . Smokeless tobacco: Never Used  . Alcohol Use: No   the patient denies recreational drug use   Family History Family History  Problem Relation Age of Onset  . Ovarian cancer Mother   . Migraines Mother   . Heart disease Maternal Grandfather   . Diabetes Father   . Migraines Brother   . Heart disease Other    her father is still alive at the age of 20. Her mother passed with ovarian cancer at an early age. Her mother also had a history of migraines   Prior to Admission medications   Medication Sig Start Date End Date Taking? Authorizing Provider  aspirin 325 MG tablet Take 325-1,350 mg by mouth every 6 (six) hours as needed for headache.   Yes Historical Provider, MD  citalopram (CELEXA) 10 MG tablet Take 10 mg by mouth at bedtime.   Yes Historical Provider, MD  estradiol (ESTRACE) 1 MG tablet Take 1 mg by mouth daily.  04/21/13  Yes Historical Provider, MD  FIBER SELECT GUMMIES PO Take 1 tablet by mouth daily.    Yes Historical Provider, MD  levothyroxine (SYNTHROID, LEVOTHROID) 75 MCG tablet Take 75 mcg by mouth daily before breakfast.    Yes Historical Provider, MD  pantoprazole (PROTONIX) 40 MG tablet Take 40 mg by mouth 2 (two) times daily.    Yes Historical Provider, MD  spironolactone (ALDACTONE) 100 MG tablet Take 100 mg by mouth daily before breakfast.    Yes Historical Provider, MD  SUMAtriptan (IMITREX) 100 MG tablet TAKE 1 TABLET MAY REPEAT IN 2 HOURS IF HEADACHE PERSIST OR RECURES 05/23/14  Yes Carmen Dohmeier, MD  valACYclovir (VALTREX) 500 MG tablet Take 500 mg by mouth daily.   Yes Historical Provider, MD  ALPRAZolam (XANAX) 1 MG tablet TAKE 1 TABLET BY MOUTH TWICE A DAY AS NEEDED FOR ANXIETY Patient not taking: Reported on 08/28/2014 05/05/14   Larey Seat, MD  citalopram (CELEXA) 20 MG tablet Take one a day po. Patient not taking:  Reported on  06/29/2014 03/28/14   Larey Seat, MD  diazepam (VALIUM) 5 MG tablet Take 1 tablet (5 mg total) by mouth every 6 (six) hours as needed for anxiety (spasms). Patient not taking: Reported on 08/28/2014 06/29/14   Elwyn Lade, PA-C  methadone (DOLOPHINE) 5 MG tablet Take 5 mg by mouth daily with breakfast.     Historical Provider, MD  ondansetron (ZOFRAN) 4 MG tablet Take 1 tablet (4 mg total) by mouth every 6 (six) hours. Patient not taking: Reported on 08/28/2014 06/29/14   Elwyn Lade, PA-C    Allergies  Allergen Reactions  . Acetaminophen Nausea And Vomiting    Physical Exam  Vitals  Blood pressure 114/57, pulse 94, temperature 98.8 F (37.1 C), temperature source Oral, resp. rate 16, SpO2 99 %.   General:  Well-developed, well-groomed, pleasant female, lying in bed in NAD, appears in a mild degree of pain  Psych:  Normal affect and insight, Not Suicidal or Homicidal, Awake Alert, Oriented X 3.  Neuro:   No F.N deficits, ALL C.Nerves Intact, Strength 5/5 all 4 extremities, Sensation intact all 4 extremities. No meningeal signs  ENT:  Ears and Eyes appear Normal, Conjunctivae clear, Moist Oral Mucosa.  Neck:  Supple Neck, No JVD, No cervical lymphadenopathy appreciated  Respiratory:  Symmetrical Chest wall movement, Good air movement bilaterally, CTAB.  Cardiac:  RRR, No Gallops, + 2/6 systolic Murmur, No Parasternal Heave.  Abdomen:  Positive Bowel Sounds, Abdomen Soft, Non tender, No organomegaly appreciated  Skin:  No Cyanosis, Normal Skin Turgor, No Skin Rash or Bruise.  Extremities:  Good muscle tone,  joints appear normal , no effusions, Normal ROM.   Data Review  CBC  Recent Labs Lab 08/27/14 2311 08/27/14 2316  WBC 10.7*  --   HGB 9.4* 11.9*  HCT 31.0* 35.0*  PLT 451*  --   MCV 72.1*  --   MCH 21.9*  --   MCHC 30.3  --   RDW 18.0*  --   LYMPHSABS 1.1  --   MONOABS 0.9  --   EOSABS 0.0  --   BASOSABS 0.0  --     ------------------------------------------------------------------------------------------------------------------  Chemistries   Recent Labs Lab 08/27/14 2311 08/27/14 2316  NA 139 142  K 3.1* 3.1*  CL 107 110  CO2 19  --   GLUCOSE 100* 110*  BUN 32* 33*  CREATININE 0.94 1.00  CALCIUM 9.2  --   MG 2.2  --   AST 22  --   ALT 13  --   ALKPHOS 65  --   BILITOT 0.3  --    ----------------------------------------------------------------------------------------------------------------  Imaging results:   Ct Head Wo Contrast  08/28/2014   CLINICAL DATA:  Severe headache, worse with light and sound. Nausea and vomiting due to pain.  EXAM: CT HEAD WITHOUT CONTRAST  TECHNIQUE: Contiguous axial images were obtained from the base of the skull through the vertex without intravenous contrast.  COMPARISON:  06/29/2014  FINDINGS: Ventricles and sulci appear symmetrical. No ventricular dilatation. No mass effect or midline shift. No abnormal extra-axial fluid collections. Gray-white matter junctions are distinct. Basal cisterns are not effaced. No evidence of acute intracranial hemorrhage. Postoperative changes with left occipital craniectomy and plate fixation. Hyperdense material in the surgical site is likely postoperative. No change since prior study. No depressed skull fractures. Visualized paranasal sinuses and mastoid air cells are not opacified.  IMPRESSION: No acute intracranial abnormalities. Postoperative changes in the left occipital bone.  Electronically Signed   By: Lucienne Capers M.D.   On: 08/28/2014 00:23   Dg Abd Portable 1v  08/28/2014   CLINICAL DATA:  NG tube placement  EXAM: PORTABLE ABDOMEN - 1 VIEW  COMPARISON:  None.  FINDINGS: Enteric tube tip is localized to the left mid abdomen consistent with location in the mid/distal stomach. Visualized bowel gas pattern is normal. Surgical clips in the right upper quadrant. Postoperative changes in the lumbosacral spine.   IMPRESSION: Enteric tube tip localizes to the mid stomach.   Electronically Signed   By: Lucienne Capers M.D.   On: 08/28/2014 02:20    My personal review of EKG:  Sinus rhythm, Unchanged from previous    Assessment & Plan  Principal Problem:   Salicylate poisoning Active Problems:   Migraine headache   History of peptic ulcer   Esophageal reflux   Hypokalemia   Salicylate poisoning Patient with accidental overdose.  ED contacted poison control, and per their instructions gave 1 g/kg of oral activated charcoal.  Poison control advised  repeat salicylate levels  every 2-3 hours.   Please contact Poison control with any further questions  [phone]. Will repeat bmet and abg at 2:00 pm 1/28. Will monitor closely on tele.  Migraine HA Chronic.  Patient is normally on Imitrex and Methadone at home. Patient received decadron, benedryl, toradol, reglan in the ER with no improvement in pain. Curb sided neurology (but did not formally consult them).  Gave depakote 500 mg x 1, magnesium 2gm, compazine.  Will restart imitrex and methadone on the floor. Will attempt to avoid heavy doses of NSAIDs due to history of hemorrhagic PUD.  GERD and Hx of PUD Continue PPI BID  Hypokalemia Repleted with IVF.  Will check BMET.  Nausea / Vomiting / Dehydration Associated with Salicylate poisoning.  Received 1 Liter boluses in the ER. Will continue to gently hydrate.  Spironolactone will be held at admission.  Add back when appropriate.   Soft diet as tolerated.  Hypothyroidism Continue synthroid.   DVT Prophylaxis: Heparin  AM Labs Ordered, also please review Full Orders  Family Communication:   Patient alert and orientated.  No family at bedside.   Code Status:  full Likely DC to  home  Condition:  Guarded.  Time spent in minutes : 60    York, Bobby Rumpf PA-C on 08/28/2014 at 8:23 AM  Between 7am to 7pm - Pager - [phone]  After 7pm go to [email or web address] - password  [email or web address]  And look for the night coverage person covering me after hours  [facility]

## 2014-08-28 NOTE — ED Notes (Signed)
Report given to [address].

## 2014-08-28 NOTE — ED Provider Notes (Signed)
CSN: [id number]     Arrival date & time 08/27/14  1943 History   First MD Initiated Contact with Patient 08/27/14 2204     Chief Complaint  Patient presents with  . Headache   Patient is a 64 y.o. female presenting with headaches. The history is provided by the patient. No language interpreter was used.  Headache Pain location:  Generalized Quality:  Sharp and stabbing (gnawing and aching) Radiates to:  Does not radiate Severity currently:  10/10 Severity at highest:  10/10 Onset quality:  Gradual Duration:  2 days Timing:  Constant Progression:  Worsening Chronicity: feels similar to prior headaches only a greater area of her head. Similar to prior headaches: yes   Context: activity, bright light and loud noise   Context: not caffeine, not coughing, not defecating, not eating, not stress, not exposure to cold air, not intercourse and not straining   Relieved by:  Nothing Worsened by:  Activity, light and sound Ineffective treatments:  Aspirin Associated symptoms: fatigue, nausea, photophobia and vomiting   Associated symptoms: no abdominal pain, no back pain, no blurred vision, no congestion, no cough, no dizziness, no ear pain, no pain, no facial pain, no fever, no focal weakness, no hearing loss, no loss of balance, no near-syncope, no neck pain, no neck stiffness, no numbness, no paresthesias, no seizures, no sore throat, no syncope, no URI, no visual change and no weakness   Risk factors: no anger, no family hx of SAH, does not have insomnia and lifestyle not sedentary      Past Medical History  Diagnosis Date  . Other constipation   . Unspecified hypothyroidism   . IBS (irritable bowel syndrome)   . PUD (peptic ulcer disease)   . Chronic pain     scoliosis  . Parotid tumor   . Anemia   . Anxiety   . Blood transfusion   . Cataract   . GERD (gastroesophageal reflux disease)   . Osteoporosis   . H/O hiatal hernia   . Trigeminal neuralgia   . S/P radiation therapy    . PONV (postoperative nausea and vomiting)     hx of " getting too much" - 1998, slow to take up by body   . Chronic kidney disease     hx of uti   . Depression   . Migraines   . High cholesterol   . 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 Hypersomnia   . Parotid tumor   . Ulcer   . Ataxia 03/24/2014   Past Surgical History  Procedure Laterality Date  . Cholecystectomy    . Abdominal hysterectomy    . Right knee surgery    . L5 surgery    . Heary surgery      correct hole in the heart  . Left ankle    . Back surgery    . Left shoulder surgery     . Cut muscles and nerves in head       to stop migraines and it did not work   . Heart outpaitent surgery       to correct hole in heart   . Brain surgery      parotid tumor removed   . Esophagogastroduodenoscopy  06/12/2012    Procedure: ESOPHAGOGASTRODUODENOSCOPY (EGD);  Surgeon: Lear Ng, MD;  Location: [facility];  Service: Endoscopy;  Laterality: N/A;  . Gamma knife for trig neuralgia  12/27/11  . Parotio gland  mass on gland removed, benign  . Mass excision  2012    for degenerative disk disease   Family History  Problem Relation Age of Onset  . Ovarian cancer Mother   . Migraines Mother   . Heart disease Maternal Grandfather   . Diabetes Father   . Migraines Brother   . Heart disease Other    History  Substance Use Topics  . Smoking status: Never Smoker   . Smokeless tobacco: Never Used  . Alcohol Use: No   OB History    No data available     Review of Systems  Constitutional: Positive for fatigue. Negative for fever.  HENT: Negative for congestion, ear pain, hearing loss and sore throat.   Eyes: Positive for photophobia. Negative for blurred vision and pain.  Respiratory: Negative for cough.   Cardiovascular: Negative for syncope and near-syncope.  Gastrointestinal: Positive for nausea and vomiting. Negative for abdominal pain.  Musculoskeletal: Negative for back pain, neck  pain and neck stiffness.  Neurological: Positive for headaches. Negative for dizziness, focal weakness, seizures, numbness, paresthesias and loss of balance.  All other systems reviewed and are negative.     Allergies  Acetaminophen  Home Medications   Prior to Admission medications   Medication Sig Start Date End Date Taking? Authorizing Provider  ALPRAZolam (XANAX) 1 MG tablet TAKE 1 TABLET BY MOUTH TWICE A DAY AS NEEDED FOR ANXIETY 05/05/14   Larey Seat, MD  citalopram (CELEXA) 10 MG tablet Take 10 mg by mouth at bedtime.    Historical Provider, MD  citalopram (CELEXA) 20 MG tablet Take one a day po. Patient not taking: Reported on 06/29/2014 03/28/14   Larey Seat, MD  diazepam (VALIUM) 5 MG tablet Take 1 tablet (5 mg total) by mouth every 6 (six) hours as needed for anxiety (spasms). 06/29/14   Elwyn Lade, PA-C  DimenhyDRINATE (DRAMAMINE FOR KIDS) 25 MG CHEW Chew 1 tablet by mouth every 6 (six) hours as needed (for nausea).     Historical Provider, MD  estradiol (ESTRACE) 1 MG tablet Take 1 mg by mouth daily.  04/21/13   Historical Provider, MD  FIBER SELECT GUMMIES PO Take 1 tablet by mouth daily.     Historical Provider, MD  levothyroxine (SYNTHROID, LEVOTHROID) 75 MCG tablet Take 75 mcg by mouth daily before breakfast.     Historical Provider, MD  methadone (DOLOPHINE) 5 MG tablet Take 5 mg by mouth daily with breakfast.     Historical Provider, MD  mirtazapine (REMERON) 15 MG tablet Take 15 mg by mouth at bedtime.    Historical Provider, MD  ondansetron (ZOFRAN) 4 MG tablet Take 4 mg by mouth every 8 (eight) hours as needed for nausea or vomiting.  10/28/13   Historical Provider, MD  ondansetron (ZOFRAN) 4 MG tablet Take 1 tablet (4 mg total) by mouth every 6 (six) hours. 06/29/14   Elwyn Lade, PA-C  Oxcarbazepine (TRILEPTAL) 300 MG tablet Take 300 mg by mouth 2 (two) times daily.    Historical Provider, MD  pantoprazole (PROTONIX) 40 MG tablet Take 40 mg by  mouth 2 (two) times daily.     Historical Provider, MD  spironolactone (ALDACTONE) 100 MG tablet Take 100 mg by mouth daily before breakfast.     Historical Provider, MD  SUMAtriptan (IMITREX) 100 MG tablet TAKE 1 TABLET MAY REPEAT IN 2 HOURS IF HEADACHE PERSIST OR RECURES 05/23/14   Asencion Partridge Dohmeier, MD  valACYclovir (VALTREX) 500 MG tablet Take 500 mg by  mouth daily.    Historical Provider, MD   BP 129/63 mmHg  Pulse 97  Temp(Src) 98.8 F (37.1 C) (Oral)  Resp 15  SpO2 100% Physical Exam  Constitutional: She is oriented to person, place, and time. She appears well-developed and well-nourished. No distress.  HENT:  Head: Normocephalic and atraumatic.  Mouth/Throat: Oropharynx is clear and moist. No oropharyngeal exudate.  Eyes: Conjunctivae and EOM are normal. Pupils are equal, round, and reactive to light. No scleral icterus.  Neck: Normal range of motion. Neck supple. No JVD present. No Brudzinski's sign and no Kernig's sign noted. No thyromegaly present.  Cardiovascular: Normal rate, regular rhythm, normal heart sounds and intact distal pulses.  Exam reveals no gallop and no friction rub.   No murmur heard. Pulmonary/Chest: Effort normal and breath sounds normal. No respiratory distress. She has no wheezes. She has no rales. She exhibits no tenderness.  Abdominal: Soft. Bowel sounds are normal. She exhibits no distension and no mass. There is no tenderness. There is no rebound and no guarding.  Musculoskeletal: Normal range of motion.  Lymphadenopathy:    She has no cervical adenopathy.  Neurological: She is alert and oriented to person, place, and time. She has normal strength. No cranial nerve deficit or sensory deficit. Coordination normal.  Skin: Skin is warm and dry. She is not diaphoretic.  Psychiatric: She has a normal mood and affect. Her behavior is normal. Judgment and thought content normal.  Nursing note and vitals reviewed.   ED Course  Procedures (including critical  care time) Labs Review Labs Reviewed  SALICYLATE LEVEL - Abnormal; Notable for the following:    Salicylate Lvl 93.7 (*)    All other components within normal limits  CBC WITH DIFFERENTIAL/PLATELET - Abnormal; Notable for the following:    WBC 10.7 (*)    Hemoglobin 9.4 (*)    HCT 31.0 (*)    MCV 72.1 (*)    MCH 21.9 (*)    RDW 18.0 (*)    Platelets 451 (*)    Neutrophils Relative % 82 (*)    Neutro Abs 8.8 (*)    Lymphocytes Relative 10 (*)    All other components within normal limits  COMPREHENSIVE METABOLIC PANEL - Abnormal; Notable for the following:    Potassium 3.1 (*)    Glucose, Bld 100 (*)    BUN 32 (*)    GFR calc non Af Amer 62 (*)    GFR calc Af Amer 72 (*)    All other components within normal limits  I-STAT CHEM 8, ED - Abnormal; Notable for the following:    Potassium 3.1 (*)    BUN 33 (*)    Glucose, Bld 110 (*)    Calcium, Ion 1.09 (*)    Hemoglobin 11.9 (*)    HCT 35.0 (*)    All other components within normal limits  I-STAT ARTERIAL BLOOD GAS, ED - Abnormal; Notable for the following:    pCO2 arterial 26.1 (*)    Bicarbonate 17.5 (*)    Acid-base deficit 6.0 (*)    All other components within normal limits  MAGNESIUM  BLOOD GAS, ARTERIAL  SALICYLATE LEVEL    Imaging Review Ct Head Wo Contrast  08/28/2014   CLINICAL DATA:  Severe headache, worse with light and sound. Nausea and vomiting due to pain.  EXAM: CT HEAD WITHOUT CONTRAST  TECHNIQUE: Contiguous axial images were obtained from the base of the skull through the vertex without intravenous contrast.  COMPARISON:  06/29/2014  FINDINGS: Ventricles and sulci appear symmetrical. No ventricular dilatation. No mass effect or midline shift. No abnormal extra-axial fluid collections. Gray-white matter junctions are distinct. Basal cisterns are not effaced. No evidence of acute intracranial hemorrhage. Postoperative changes with left occipital craniectomy and plate fixation. Hyperdense material in the  surgical site is likely postoperative. No change since prior study. No depressed skull fractures. Visualized paranasal sinuses and mastoid air cells are not opacified.  IMPRESSION: No acute intracranial abnormalities. Postoperative changes in the left occipital bone.   Electronically Signed   By: Lucienne Capers M.D.   On: 08/28/2014 00:23     EKG Interpretation None      MDM   Final diagnoses:  Migraine without status migrainosus, not intractable, unspecified migraine type  Aspirin toxicity, accidental or unintentional, initial encounter   Patient is a 64 year old female who presents emergency room for evaluation of headache. Patient reports that she has been taking doses of massive amounts of aspirin. Headache seems consistent with migraines. There are no focal neurological deficits on examination. There is negative Kernig and Brudzinski. There is no evidence of meningismus at this time. Head CT is negative. Patient does have elevated salicylate level of 32. I-STAT Chem-8 reveals mild hypokalemia. Patient does complain of mild shortness of breath. Arterial blood gas reveals mild alkalosis likely metabolic in nature. I have ordered CBC, CMP, magnesium level, and EKG.  I spoken with poison control was recommended that the patient be hydrated and given 1 g/kg of oral activated charcoal. Repeat salicylate levels are recommended every 2-3 hours. I will sign the patient with Dr. Kathrynn Humble will follow-up on the patient. Patient likely need admission for observation for aspirin toxicity.  Cherylann Parr, PA-C 08/28/14 0154  Threasa Beards, MD 08/30/14 [phone]

## 2014-08-28 NOTE — ED Notes (Addendum)
Pt c/o earache for 4-5 days. PA notified and at bedside.

## 2014-08-28 NOTE — ED Notes (Signed)
Xray called to confirm placement of NG tube

## 2014-08-29 DIAGNOSIS — G43009 Migraine without aura, not intractable, without status migrainosus: Secondary | ICD-10-CM

## 2014-08-29 LAB — COMPREHENSIVE METABOLIC PANEL
ALT: 13 U/L (ref 0–35)
ANION GAP: 5 (ref 5–15)
AST: 23 U/L (ref 0–37)
Albumin: 3.3 g/dL — ABNORMAL LOW (ref 3.5–5.2)
Alkaline Phosphatase: 49 U/L (ref 39–117)
BILIRUBIN TOTAL: 0.4 mg/dL (ref 0.3–1.2)
BUN: 14 mg/dL (ref 6–23)
CHLORIDE: 109 mmol/L (ref 96–112)
CO2: 27 mmol/L (ref 19–32)
Calcium: 8.3 mg/dL — ABNORMAL LOW (ref 8.4–10.5)
Creatinine, Ser: 0.79 mg/dL (ref 0.50–1.10)
GFR calc Af Amer: >90 mL/min (ref >90)
GFR calc non Af Amer: 86 mL/min — ABNORMAL LOW (ref >90)
Glucose, Bld: 93 mg/dL (ref 70–99)
POTASSIUM: 3.2 mmol/L — AB (ref 3.5–5.1)
Sodium: 141 mmol/L (ref 135–145)
Total Protein: 5.5 g/dL — ABNORMAL LOW (ref 6.0–8.3)

## 2014-08-29 LAB — GLUCOSE, CAPILLARY
Glucose-Capillary: 98 mg/dL (ref 70–99)
Glucose-Capillary: 90 mg/dL (ref 70–99)
Glucose-Capillary: 88 mg/dL (ref 70–99)
Glucose-Capillary: 100 mg/dL — ABNORMAL HIGH (ref 70–99)

## 2014-08-29 MED ORDER — POTASSIUM CHLORIDE CRYS ER 20 MEQ PO TBCR
40.0000 meq | EXTENDED_RELEASE_TABLET | Freq: Once | ORAL | Status: AC
  Administered 2014-08-29: 40 meq via ORAL
  Filled 2014-08-29: qty 2

## 2014-08-29 MED ORDER — GABAPENTIN 300 MG PO CAPS
300.0000 mg | ORAL_CAPSULE | Freq: Two times a day (BID) | ORAL | Status: DC
  Administered 2014-08-29: 300 mg via ORAL
  Filled 2014-08-29: qty 1

## 2014-08-29 MED ORDER — ARIPIPRAZOLE 5 MG PO TABS
5.0000 mg | ORAL_TABLET | Freq: Every day | ORAL | Status: DC
  Filled 2014-08-29 (×2): qty 1

## 2014-08-29 MED ORDER — SUMATRIPTAN SUCCINATE 100 MG PO TABS: 100.0000 mg | ORAL_TABLET | ORAL | Status: DC | PRN

## 2014-08-29 MED ORDER — ARIPIPRAZOLE 5 MG PO TABS: 5.0000 mg | ORAL_TABLET | Freq: Every day | ORAL | Status: DC

## 2014-08-29 MED ORDER — ALPRAZOLAM 1 MG PO TABS: 1.0000 mg | ORAL_TABLET | Freq: Two times a day (BID) | ORAL | Status: DC | PRN

## 2014-08-29 MED ORDER — SUMATRIPTAN SUCCINATE 6 MG/0.5ML ~~LOC~~ SOLN
6.0000 mg | SUBCUTANEOUS | Status: DC | PRN
  Filled 2014-08-29: qty 0.5

## 2014-08-29 MED ORDER — GABAPENTIN 300 MG PO CAPS: 300.0000 mg | ORAL_CAPSULE | Freq: Two times a day (BID) | ORAL | Status: DC

## 2014-08-29 NOTE — Discharge Summary (Signed)
PATIENT DETAILS Name: Caitlin Irwin Age: 64 y.o. Sex: female Date of Birth: [birth year] MRN: [id number]. Admitting Physician: Louellen Molder, MD OAC:ZYSAY, Keenan Bachelor, MD  Admit Date: 08/27/2014 Discharge date: 08/29/2014  Recommendations for Outpatient Follow-up:  1. Optimize anti-migraine medications. 2. Check CBC and electrolytes at next visit  PRIMARY DISCHARGE DIAGNOSIS:  Principal Problem:   Salicylate poisoning Active Problems:   History of peptic ulcer   Esophageal reflux   Migraine headache   Hypokalemia   Salicylate intoxication   Aspirin toxicity      PAST MEDICAL HISTORY: Past Medical History  Diagnosis Date  . Other constipation   . Unspecified hypothyroidism   . IBS (irritable bowel syndrome)   . PUD (peptic ulcer disease)   . Chronic pain     scoliosis  . Parotid tumor   . Anemia   . Anxiety   . Blood transfusion   . Cataract   . GERD (gastroesophageal reflux disease)   . Osteoporosis   . H/O hiatal hernia   . Trigeminal neuralgia   . S/P radiation therapy   . PONV (postoperative nausea and vomiting)     hx of " getting too much" - 1998, slow to take up by body   . Chronic kidney disease     hx of uti   . Depression   . Migraines   . High cholesterol   . 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 Hypersomnia   . Parotid tumor   . Ulcer   . Ataxia 03/24/2014  . Accidental ASA poisoning 08/2014    DISCHARGE MEDICATIONS: Current Discharge Medication List    START taking these medications   Details  ARIPiprazole (ABILIFY) 5 MG tablet Take 1 tablet (5 mg total) by mouth daily. Qty: 30 tablet, Refills: 0    gabapentin (NEURONTIN) 300 MG capsule Take 1 capsule (300 mg total) by mouth 2 (two) times daily.      CONTINUE these medications which have CHANGED   Details  ALPRAZolam (XANAX) 1 MG tablet Take 1 tablet (1 mg total) by mouth 2 (two) times daily as needed for anxiety. Qty: 30 tablet, Refills: 0    SUMAtriptan  (IMITREX) 100 MG tablet Take 1 tablet (100 mg total) by mouth every 2 (two) hours as needed for migraine or headache. May repeat in 2 hours if headache persists or recurs. Qty: 45 tablet, Refills: 0      CONTINUE these medications which have NOT CHANGED   Details  citalopram (CELEXA) 10 MG tablet Take 10 mg by mouth at bedtime.    estradiol (ESTRACE) 1 MG tablet Take 1 mg by mouth daily.     FIBER SELECT GUMMIES PO Take 1 tablet by mouth daily.     levothyroxine (SYNTHROID, LEVOTHROID) 75 MCG tablet Take 75 mcg by mouth daily before breakfast.     pantoprazole (PROTONIX) 40 MG tablet Take 40 mg by mouth 2 (two) times daily.     spironolactone (ALDACTONE) 100 MG tablet Take 100 mg by mouth daily before breakfast.     valACYclovir (VALTREX) 500 MG tablet Take 500 mg by mouth daily.    ondansetron (ZOFRAN) 4 MG tablet Take 1 tablet (4 mg total) by mouth every 6 (six) hours. Qty: 12 tablet, Refills: 0      STOP taking these medications     aspirin 325 MG tablet      diazepam (VALIUM) 5 MG tablet      methadone (DOLOPHINE) 5 MG  tablet         ALLERGIES:   Allergies  Allergen Reactions  . Acetaminophen Nausea And Vomiting    BRIEF HPI:  See H&P, Labs, Consult and Test reports for all details in brief, patient a 64 y.o. female, with a past medical history of chronic migraine, chronic kidney disease at an (baseline creatinine 1.0), peptic ulcer disease, and trigeminal neuralgia, who presented to the emergency department with severe migraine and salicylate toxicity.  CONSULTATIONS:   [employer]  PERTINENT RADIOLOGIC STUDIES: Ct Head Wo Contrast  08/28/2014   CLINICAL DATA:  Severe headache, worse with light and sound. Nausea and vomiting due to pain.  EXAM: CT HEAD WITHOUT CONTRAST  TECHNIQUE: Contiguous axial images were obtained from the base of the skull through the vertex without intravenous contrast.  COMPARISON:  06/29/2014  FINDINGS: Ventricles and sulci  appear symmetrical. No ventricular dilatation. No mass effect or midline shift. No abnormal extra-axial fluid collections. Gray-white matter junctions are distinct. Basal cisterns are not effaced. No evidence of acute intracranial hemorrhage. Postoperative changes with left occipital craniectomy and plate fixation. Hyperdense material in the surgical site is likely postoperative. No change since prior study. No depressed skull fractures. Visualized paranasal sinuses and mastoid air cells are not opacified.  IMPRESSION: No acute intracranial abnormalities. Postoperative changes in the left occipital bone.   Electronically Signed   By: Lucienne Capers M.D.   On: 08/28/2014 00:23   Dg Abd Portable 1v  08/28/2014   CLINICAL DATA:  NG tube placement  EXAM: PORTABLE ABDOMEN - 1 VIEW  COMPARISON:  None.  FINDINGS: Enteric tube tip is localized to the left mid abdomen consistent with location in the mid/distal stomach. Visualized bowel gas pattern is normal. Surgical clips in the right upper quadrant. Postoperative changes in the lumbosacral spine.  IMPRESSION: Enteric tube tip localizes to the mid stomach.   Electronically Signed   By: Lucienne Capers M.D.   On: 08/28/2014 02:20     PERTINENT LAB RESULTS: CBC:  Recent Labs  08/27/14 2311 08/27/14 2316  WBC 10.7*  --   HGB 9.4* 11.9*  HCT 31.0* 35.0*  PLT 451*  --    CMET CMP     Component Value Date/Time   NA 141 08/29/2014 0506   K 3.2* 08/29/2014 0506   CL 109 08/29/2014 0506   CO2 27 08/29/2014 0506   GLUCOSE 93 08/29/2014 0506   BUN 14 08/29/2014 0506   CREATININE 0.79 08/29/2014 0506   CALCIUM 8.3* 08/29/2014 0506   PROT 5.5* 08/29/2014 0506   ALBUMIN 3.3* 08/29/2014 0506   AST 23 08/29/2014 0506   ALT 13 08/29/2014 0506   ALKPHOS 49 08/29/2014 0506   BILITOT 0.4 08/29/2014 0506   GFRNONAA 86* 08/29/2014 0506   GFRAA >90 08/29/2014 0506    GFR Estimated Creatinine Clearance: 49.9 mL/min (by C-G formula based on Cr of  0.79). No results for input(s): LIPASE, AMYLASE in the last 72 hours. No results for input(s): CKTOTAL, CKMB, CKMBINDEX, TROPONINI in the last 72 hours. Invalid input(s): POCBNP No results for input(s): DDIMER in the last 72 hours. No results for input(s): HGBA1C in the last 72 hours. No results for input(s): CHOL, HDL, LDLCALC, TRIG, CHOLHDL, LDLDIRECT in the last 72 hours. No results for input(s): TSH, T4TOTAL, T3FREE, THYROIDAB in the last 72 hours.  Invalid input(s): FREET3 No results for input(s): VITAMINB12, FOLATE, FERRITIN, TIBC, IRON, RETICCTPCT in the last 72 hours. Coags: No results for input(s): INR in  the last 72 hours.  Invalid input(s): PT Microbiology: No results found for this or any previous visit (from the past 240 hour(s)).   BRIEF HOSPITAL COURSE:   Principal Problem:   Salicylate poisoning: Secondary to accidental overdose, patient took 4 tablets of 325 mg of aspirin every 4 hours until she started having vomiting and abdominal pain. She took this much of aspirin in order to get relief from her intractable migraine headache as she ran out of her usual medications. She presented to the emergency room, where she was found to have an elevated salicylate level. Poison control was contacted, recommendations were to give activated charcoal, and IV fluid with bicarbonate. With these measures, patient improved, electrolytes and ABG significantly improved. Salicylate levels have now tracked back down to normal range. This M.D. spoke with poison control ([place])on 1/29, no further recommendations. Okay to discharge. Patient has been asked to avoid salicylates-aspirin in the future.  Active Problems:   Migraine headache: This is a chronic issue for the patient, she has been having headaches since the age of 67. She sees a neurologist for this. She was given IV Depakene with some relief. This morning her headache has improved significantly, and she claims that it is on its way  out. She also sees pain management for her chronic headaches, and claims that she has weaned herself off methadone and does not want to take methadone anymore. She is being provided a prescription for Imitrex, she will continue her usual medications as prescribed to her by a neurologist. She claims she has a follow-up with her neurologist in the next 1-2 weeks, which she has been asked to keep.  Rest of her medical issues are stable   TODAY-DAY OF DISCHARGE:  Subjective:   [name] today has no headache,no chest abdominal pain,no new weakness tingling or numbness, feels much better wants to go home today.   Objective:   Blood pressure 116/50, pulse 61, temperature 98.2 F (36.8 C), temperature source Oral, resp. rate 15, height 4\' 10"  (1.473 m), weight 51.256 kg (113 lb), SpO2 94 %.  Intake/Output Summary (Last 24 hours) at 08/29/14 1107 Last data filed at 08/29/14 1044  Gross per 24 hour  Intake    835 ml  Output   1150 ml  Net   -315 ml   Filed Weights   08/28/14 1023  Weight: 51.256 kg (113 lb)    Exam Awake Alert, Oriented *3, No new F.N deficits, Normal affect [place].AT,PERRAL Supple Neck,No JVD, No cervical lymphadenopathy appriciated.  Symmetrical Chest wall movement, Good air movement bilaterally, CTAB RRR,No Gallops,Rubs or new Murmurs, No Parasternal Heave +ve B.Sounds, Abd Soft, Non tender, No organomegaly appriciated, No rebound -guarding or rigidity. No Cyanosis, Clubbing or edema, No new Rash or bruise  DISCHARGE CONDITION: Stable  DISPOSITION: Home  DISCHARGE INSTRUCTIONS:    Activity:  As tolerated  Diet recommendation: Heart Healthy diet  Discharge Instructions    Call MD for:  persistant nausea and vomiting    Complete by:  As directed      Call MD for:  severe uncontrolled pain    Complete by:  As directed      Diet - low sodium heart healthy    Complete by:  As directed      Increase activity slowly    Complete by:  As directed             Follow-up Information    Follow up with GREEN, Keenan Bachelor, MD. Schedule an  appointment as soon as possible for a visit in 1 week.   Specialty:  Internal Medicine   Contact information:   [address] [place] [address] [phone]       Follow up with [facility], MD.   Specialty:  Neurology   Why:  keep next appt   Contact information:   [place] [address] [phone]         Total Time spent on discharge equals 45 minutes.  SignedOren Binet 08/29/2014 11:07 AM

## 2014-08-29 NOTE — Progress Notes (Signed)
UR completed 

## 2014-08-29 NOTE — Progress Notes (Signed)
Discussed discharge summary with patient. Reviewed all medications with patient. Patient received Rx. Patient ready for discharge. 

## 2014-09-01 DIAGNOSIS — T39011A Poisoning by aspirin, accidental (unintentional), initial encounter: Secondary | ICD-10-CM

## 2014-09-01 HISTORY — DX: Poisoning by aspirin, accidental (unintentional), initial encounter: T39.011A

## 2014-09-05 ENCOUNTER — Ambulatory Visit (INDEPENDENT_AMBULATORY_CARE_PROVIDER_SITE_OTHER): Payer: PPO | Admitting: Licensed Clinical Social Worker

## 2014-09-05 DIAGNOSIS — F332 Major depressive disorder, recurrent severe without psychotic features: Secondary | ICD-10-CM

## 2014-09-19 ENCOUNTER — Ambulatory Visit (INDEPENDENT_AMBULATORY_CARE_PROVIDER_SITE_OTHER): Payer: PPO | Admitting: Licensed Clinical Social Worker

## 2014-09-19 DIAGNOSIS — F332 Major depressive disorder, recurrent severe without psychotic features: Secondary | ICD-10-CM

## 2014-09-24 ENCOUNTER — Ambulatory Visit: Payer: Medicare Other | Admitting: Nurse Practitioner

## 2014-09-29 ENCOUNTER — Telehealth: Payer: Self-pay | Admitting: Neurology

## 2014-09-29 NOTE — Telephone Encounter (Signed)
Claiborne Billings with [facility] is calling for an authorization for Rx Sumatritan 100 mg. Please call [phone].  Thanks!

## 2014-09-29 NOTE — Telephone Encounter (Signed)
I contacted [facility].  Provided clinical info.  Request is currently under review.  Ref Key: [id number]

## 2014-09-30 ENCOUNTER — Ambulatory Visit: Payer: PPO | Admitting: Licensed Clinical Social Worker

## 2014-10-01 ENCOUNTER — Ambulatory Visit: Payer: PPO | Admitting: Licensed Clinical Social Worker

## 2014-10-08 ENCOUNTER — Ambulatory Visit: Payer: Self-pay | Admitting: Nurse Practitioner

## 2014-10-20 ENCOUNTER — Other Ambulatory Visit: Payer: Self-pay | Admitting: Neurology

## 2014-10-21 NOTE — Telephone Encounter (Signed)
No longer on med list. Okay to refill? 

## 2014-11-06 NOTE — Telephone Encounter (Signed)
We received a letter in the mail (very delayed) from [employer] stating the request for coverage was approved on 10/02/2014 effective until 08/01/2015.

## 2014-11-12 ENCOUNTER — Other Ambulatory Visit: Payer: Self-pay | Admitting: Neurology

## 2014-11-12 NOTE — Telephone Encounter (Signed)
Last OV note on 04/16/14 says: Continue Xanax when necessary.  Patient of Doh/CM

## 2014-11-12 NOTE — Telephone Encounter (Signed)
Caitlin Irwin: I don't see where Dr. Brett Fairy is prescribing the Xanax. Can you look into it?

## 2014-11-27 ENCOUNTER — Encounter: Payer: Self-pay | Admitting: Gastroenterology

## 2014-11-27 ENCOUNTER — Other Ambulatory Visit: Payer: Self-pay | Admitting: Neurology

## 2014-11-27 NOTE — Telephone Encounter (Signed)
Rx has been faxed.

## 2014-11-30 HISTORY — PX: SHOULDER ARTHROSCOPY W/ ROTATOR CUFF REPAIR: SHX2400

## 2014-12-22 ENCOUNTER — Other Ambulatory Visit: Payer: Self-pay | Admitting: Internal Medicine

## 2014-12-22 ENCOUNTER — Other Ambulatory Visit: Payer: Self-pay | Admitting: Nurse Practitioner

## 2014-12-22 DIAGNOSIS — R1906 Epigastric swelling, mass or lump: Secondary | ICD-10-CM

## 2014-12-23 NOTE — Telephone Encounter (Signed)
This Rx was sent last month for a 6 month supply.  I called the pharmacy and spoke with Erasmo Downer.  She verified they do have the Rx on file and will fill it today.

## 2014-12-24 ENCOUNTER — Other Ambulatory Visit: Payer: Self-pay

## 2014-12-25 ENCOUNTER — Ambulatory Visit
Admission: RE | Admit: 2014-12-25 | Discharge: 2014-12-25 | Disposition: A | Discharge status: Home / Self Care | Payer: PPO | Source: Ambulatory Visit | Attending: Internal Medicine | Admitting: Internal Medicine

## 2014-12-25 DIAGNOSIS — R1906 Epigastric swelling, mass or lump: Secondary | ICD-10-CM

## 2014-12-25 MED ORDER — IOHEXOL 300 MG/ML  SOLN
100.0000 mL | Freq: Once | INTRAMUSCULAR | Status: AC | PRN
  Administered 2014-12-25: 100 mL via INTRAVENOUS

## 2014-12-26 ENCOUNTER — Other Ambulatory Visit: Payer: Self-pay | Admitting: Internal Medicine

## 2015-01-22 ENCOUNTER — Other Ambulatory Visit: Payer: Self-pay | Admitting: Neurology

## 2015-01-26 ENCOUNTER — Other Ambulatory Visit: Payer: Self-pay

## 2015-02-11 NOTE — Telephone Encounter (Signed)
error 

## 2015-02-12 ENCOUNTER — Other Ambulatory Visit: Payer: Self-pay | Admitting: Nurse Practitioner

## 2015-02-12 NOTE — Telephone Encounter (Signed)
Dennie Bible, NP at 05/19/2014 3:54 PM     Status: Signed       Expand All Collapse All   TC to patient may increase gabapentin to 3 times daily however I also suggest that she contact Dr. Salomon Fick and see if he has other suggestions for her since he did her surgery for trigeminal neuralgia. She is agreeable. We will call in RX

## 2015-02-24 ENCOUNTER — Other Ambulatory Visit: Payer: Self-pay | Admitting: Nurse Practitioner

## 2015-02-24 ENCOUNTER — Other Ambulatory Visit: Payer: Self-pay | Admitting: Neurology

## 2015-03-18 ENCOUNTER — Other Ambulatory Visit: Payer: Self-pay | Admitting: Internal Medicine

## 2015-03-18 ENCOUNTER — Ambulatory Visit: Payer: Self-pay | Admitting: Nurse Practitioner

## 2015-03-18 DIAGNOSIS — M810 Age-related osteoporosis without current pathological fracture: Secondary | ICD-10-CM

## 2015-03-19 ENCOUNTER — Other Ambulatory Visit: Payer: Self-pay | Admitting: Neurology

## 2015-03-19 NOTE — Telephone Encounter (Signed)
Has appt scheduled.

## 2015-03-21 ENCOUNTER — Other Ambulatory Visit: Payer: Self-pay | Admitting: Neurology

## 2015-03-26 ENCOUNTER — Ambulatory Visit
Admission: RE | Admit: 2015-03-26 | Discharge: 2015-03-26 | Disposition: A | Discharge status: Home / Self Care | Payer: PPO | Source: Ambulatory Visit | Attending: Internal Medicine | Admitting: Internal Medicine

## 2015-03-26 DIAGNOSIS — M810 Age-related osteoporosis without current pathological fracture: Secondary | ICD-10-CM

## 2015-03-27 ENCOUNTER — Other Ambulatory Visit: Payer: Self-pay | Admitting: Neurology

## 2015-04-01 ENCOUNTER — Ambulatory Visit: Payer: Self-pay | Admitting: Nurse Practitioner

## 2015-04-14 DIAGNOSIS — Z0289 Encounter for other administrative examinations: Secondary | ICD-10-CM

## 2015-04-15 ENCOUNTER — Encounter: Payer: Self-pay | Admitting: Nurse Practitioner

## 2015-04-15 ENCOUNTER — Ambulatory Visit (INDEPENDENT_AMBULATORY_CARE_PROVIDER_SITE_OTHER): Payer: PPO | Admitting: Nurse Practitioner

## 2015-04-15 VITALS — BP 125/72 | HR 84 | Ht 59.0 in | Wt 118.4 lb

## 2015-04-15 DIAGNOSIS — G43009 Migraine without aura, not intractable, without status migrainosus: Secondary | ICD-10-CM

## 2015-04-15 DIAGNOSIS — G500 Trigeminal neuralgia: Secondary | ICD-10-CM

## 2015-04-15 MED ORDER — GABAPENTIN 300 MG PO CAPS: ORAL_CAPSULE | ORAL | Status: DC

## 2015-04-15 MED ORDER — TOPIRAMATE 25 MG PO TABS: ORAL_TABLET | ORAL | Status: DC

## 2015-04-15 NOTE — Progress Notes (Signed)
[facility]  PATIENT: Caitlin Irwin DOB: [birth year]   REASON FOR VISIT: Trigeminal neuralgia , atypical facial pain, migraines HISTORY FROM: Patient    HISTORY OF PRESENT ILLNESS:Caitlin Irwin, 64 year old female returns for followup. She was last seen in the office 04/16/2014.  At that time she is advised  that she needed psychiatric treatment,grief counseling with depression and insomnia being her major concerns since her husbands death. Patient has been seeing Caitlin Irwin, and was prescribed Celexa and Abilify. She became hyperactive on the Abilify and has tapered off of that. She feels her depression and grief are under better control. Her facial pain is  stable, she is due to see Caitlin Irwin next month for follow-up. She  is currently taking  gabapentin  twice daily.She receives her Imitrex from [place] . She sees Caitlin Irwin for her chronic pain syndrome. She returns for reevaluation.  HISTORY: Caitlin Irwin, 64 year old female, recently widowed, returns for followup.  She had gamma knife with success however continued numbness.  She was scheduled for ablation procedure but her husband died unexpectedly and she canceled the appointment. She is here today to get that rescheduled, it has already been approved by her insurance. She continues to bite the inside of her buccal tissue. There has been no change in her numbness. She returns for reevaluation.  She is severely depressed, and contacted hospice to join a grief counseling group. She has severe insomnia, has lost weight, and is visibly aged.  She has tried all kinds of sleep aids, none gives her the desired effect. She likely overdosed on a mix of pills, and had a breakdown last month.  She has had multiple falls, injured her rotator cuff and back. She is stressed, her house is on the market, she can not find the things she looks for.  She cannot find certain objects out of the inheritance.  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      HISTORY CD- 0 2014 ; gamma knife surgery for facial pain.  She has had success in the treatment of the neuralgia, but numbness remained, see letter for Caitlin Irwin.  He suggested a sphenopalatine ganglion block or ablation. I offered her to discuss this procedure, and this to be done here in [place] with Caitlin Irwin.  Caitlin Irwin is again insomnic, sleeps on Ambien only 2-3 hours , has rebound insomnia from pain.  She is biting the inside of her buccal tissue   REVIEW OF SYSTEMS: Full 14 system review of systems performed and notable only for those listed, all others are neg:  Constitutional: neg  Cardiovascular: neg Ear/Nose/Throat: neg  Skin: neg Eyes: neg Respiratory: neg Gastroitestinal: neg  Hematology/Lymphatic: anemia Musculoskeletal:neg Allergy/Immunology: neg Neurological: neg Psychiatric: neg Sleep : Insomnia, frequent waking   ALLERGIES: Allergies  Allergen Reactions  . Acetaminophen Nausea And Vomiting    HOME MEDICATIONS: Outpatient Prescriptions Prior to Visit  Medication Sig Dispense Refill  . ALPRAZolam (XANAX) 1 MG tablet TABLET BY MOUTH TWICE A DAY AS NEEDED FOR ANXIETY 60 tablet 5  . ARIPiprazole (ABILIFY) 5 MG tablet Take 1 tablet (5 mg total) by mouth daily. (Patient taking differently: Take 2.5 mg by mouth daily. ) 30 tablet 0  . citalopram (CELEXA) 10 MG tablet Take 10 mg by mouth at bedtime.    Marland Kitchen estradiol (ESTRACE) 1 MG tablet Take 1 mg by mouth daily.     Marland Kitchen FIBER SELECT GUMMIES PO Take 1 tablet by mouth daily.     Marland Kitchen gabapentin (  NEURONTIN) 300 MG capsule TAKE 1 CAPSULE (300 MG TOTAL) BY MOUTH 3 (THREE) TIMES DAILY. (Patient taking differently: taking one bid) 90 capsule 0  . levothyroxine (SYNTHROID, LEVOTHROID) 75 MCG tablet Take 75 mcg by mouth daily before breakfast.     . ondansetron (ZOFRAN) 4 MG tablet Take 1 tablet (4 mg total) by mouth every 6 (six) hours. (Patient taking  differently: Take 4 mg by mouth every 8 (eight) hours as needed. ) 12 tablet 0  . pantoprazole (PROTONIX) 40 MG tablet Take 40 mg by mouth 2 (two) times daily.     Marland Kitchen spironolactone (ALDACTONE) 100 MG tablet Take 100 mg by mouth daily before breakfast.     . SUMAtriptan (IMITREX) 100 MG tablet TAKE 1 TABLET MAY REPEAT IN 2 HOURS IF HEADACHE PERSIST OR RECURES 15 tablet 0  . topiramate (TOPAMAX) 25 MG tablet TAKE 2 TABLETS (50 MG TOTAL) BY MOUTH 2 (TWO) TIMES DAILY. 120 tablet 3  . valACYclovir (VALTREX) 500 MG tablet Take 500 mg by mouth daily.    Marland Kitchen gabapentin (NEURONTIN) 300 MG capsule TAKE 1 CAPSULE (300 MG TOTAL) BY MOUTH 3 (THREE) TIMES DAILY. 90 capsule 0  . SUMAtriptan (IMITREX) 100 MG tablet TAKE 1 TABLET MAY REPEAT IN 2 HOURS IF HEADACHE PERSIST OR RECURES 15 tablet 0   No facility-administered medications prior to visit.    PAST MEDICAL HISTORY: Past Medical History  Diagnosis Date  . Other constipation   . Unspecified hypothyroidism   . IBS (irritable bowel syndrome)   . PUD (peptic ulcer disease)   . Chronic pain     scoliosis  . Parotid tumor   . Anemia   . Anxiety   . Blood transfusion   . Cataract   . GERD (gastroesophageal reflux disease)   . Osteoporosis   . H/O hiatal hernia   . Trigeminal neuralgia   . S/P radiation therapy   . PONV (postoperative nausea and vomiting)     hx of " getting too much" - 1998, slow to take up by body   . Chronic kidney disease     hx of uti   . Depression   . Migraines   . High cholesterol   . 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 Hypersomnia   . Parotid tumor   . Ulcer   . Ataxia 03/24/2014  . Accidental ASA poisoning 08/2014    PAST SURGICAL HISTORY: Past Surgical History  Procedure Laterality Date  . Cholecystectomy    . Abdominal hysterectomy    . Right knee surgery    . L5 surgery    . Heary surgery      correct hole in the heart  . Left ankle    . Back surgery    . Left shoulder surgery     .  Cut muscles and nerves in head       to stop migraines and it did not work   . Heart outpaitent surgery       to correct hole in heart   . Brain surgery      parotid tumor removed   . Esophagogastroduodenoscopy  06/12/2012    Procedure: ESOPHAGOGASTRODUODENOSCOPY (EGD);  Surgeon: Lear Ng, MD;  Location: [facility];  Service: Endoscopy;  Laterality: N/A;  . Gamma knife for trig neuralgia  12/27/11  . Parotio gland      mass on gland removed, benign  . Mass excision  2012    for degenerative disk  disease  . Shoulder surgery Right 11-2014  . Aspirin poisoning  09/2014  . Abdominal hernia repair  05-14-15    Dr. Redmond Pulling to operate    FAMILY HISTORY: Family History  Problem Relation Age of Onset  . Ovarian cancer Mother   . Migraines Mother   . Heart disease Maternal Grandfather   . Diabetes Father   . Migraines Brother   . Heart disease Other     SOCIAL HISTORY: Social History   Social History  . Marital Status: Widowed    Spouse Name: N/A  . Number of Children: 0  . Years of Education: college   Occupational History  . disabled    Social History Main Topics  . Smoking status: Never Smoker   . Smokeless tobacco: Never Used  . Alcohol Use: No  . Drug Use: No  . Sexual Activity: Not on file   Other Topics Concern  . Not on file   Social History Narrative   Patient lives at home alone.    Patient husband past away 07-03-13.   Patient is widowed.   Patient is on disability.    Patient is right handed.   Patient has no children.   Patient is a college grad.     PHYSICAL EXAM  Filed Vitals:   04/15/15 1324  BP: 125/72  Pulse: 84  Height: 4\' 11"  (1.499 m)  Weight: 118 lb 6.4 oz (53.706 kg)   Body mass index is 23.9 kg/(m^2). Generalized: thin ,  in no acute distress   Head: normocephalic and atraumatic,  tongue is dry.  Neck: Supple, no carotid bruits ,   Cardiac: Regular rate rhythm, no murmur  Musculoskeletal: No deformity .   Neurological examination  Mentation: Alert oriented to time, place, history taking. Reports Major depression is better Follows all commands speech and language fluent,   Cranial nerve II-XII:  Pupils were equal round reactive to light extraocular movements were full, visual field were full on confrontational test. Facial sensation and strength were normal. hearing was intact to finger rubbing bilaterally. Uvula tongue midline. head turning and shoulder shrug were normal and symmetric.Tongue protrusion into cheek strength was normal. Motor: normal bulk and tone, full strength in the BUE, BLE, No focal weakness Sensory: normal and symmetric to light touch, pinprick, and vibration  Coordination: finger-nose-finger bilaterally, no dysmetria Reflexes: 2/2, plantar responses were flexor bilaterally. Gait and Station: Rising up from seated position without assistance, has moderate stride, good arm swing, smooth turning, able to perform tiptoe, and heel walking without difficulty. Tandem gait is steady, no assistive device    DIAGNOSTIC DATA (LABS, IMAGING, TESTING) - I reviewed patient records, labs, notes, testing and imaging myself where available.  Lab Results  Component Value Date   WBC 10.7* 08/27/2014   HGB 11.9* 08/27/2014   HCT 35.0* 08/27/2014   MCV 72.1* 08/27/2014   PLT 451* 08/27/2014      Component Value Date/Time   NA 141 08/29/2014 0506   K 3.2* 08/29/2014 0506   CL 109 08/29/2014 0506   CO2 27 08/29/2014 0506   GLUCOSE 93 08/29/2014 0506   BUN 14 08/29/2014 0506   CREATININE 0.79 08/29/2014 0506   CALCIUM 8.3* 08/29/2014 0506   PROT 5.5* 08/29/2014 0506   ALBUMIN 3.3* 08/29/2014 0506   AST 23 08/29/2014 0506   ALT 13 08/29/2014 0506   ALKPHOS 49 08/29/2014 0506   BILITOT 0.4 08/29/2014 0506   GFRNONAA 86* 08/29/2014 0506   GFRAA >90 08/29/2014 5409  ASSESSMENT AND PLAN  64 y.o. year old female  has a past medical history of Chronic pain;   Anemia; Anxiety;  Trigeminal neuralgia; S/P radiation therapy;  Depression; Migraines; Neuralgia; Hypersomnia;  and Accidental ASA poisoning (08/2014). Here to follow-up  Continue Gabapentin at current dose will refill Continue Imitrex  Med obtained from [place] Continue Topamax at current dose will refill Continue with psychiatric visits, continue with chronic pain management visits F/U yearly Dennie Bible, [facility], [facility], [facility] [address], [place], [place] [address] [phone]

## 2015-04-15 NOTE — Patient Instructions (Addendum)
Continue Gabapentin at current dose will refill Continue Imitrex  Med obtained from [place] Continue Topamax at current dose will refill F/U yearly

## 2015-04-21 NOTE — Progress Notes (Signed)
I agree with the assessment and plan as directed by NP .The patient is known to me .   DOHMEIER,CARMEN, MD  

## 2015-04-26 ENCOUNTER — Other Ambulatory Visit: Payer: Self-pay | Admitting: Neurology

## 2015-05-17 ENCOUNTER — Other Ambulatory Visit: Payer: Self-pay | Admitting: Neurology

## 2015-05-27 ENCOUNTER — Encounter (HOSPITAL_COMMUNITY): Payer: Self-pay | Admitting: Emergency Medicine

## 2015-05-27 ENCOUNTER — Observation Stay (HOSPITAL_COMMUNITY)
Admission: EM | Admit: 2015-05-27 | Discharge: 2015-05-29 | Disposition: A | Discharge status: Home / Self Care | Payer: PPO | Attending: Internal Medicine | Admitting: Internal Medicine

## 2015-05-27 DIAGNOSIS — G43919 Migraine, unspecified, intractable, without status migrainosus: Principal | ICD-10-CM | POA: Diagnosis present

## 2015-05-27 DIAGNOSIS — G43909 Migraine, unspecified, not intractable, without status migrainosus: Secondary | ICD-10-CM | POA: Diagnosis present

## 2015-05-27 DIAGNOSIS — R1110 Vomiting, unspecified: Secondary | ICD-10-CM | POA: Insufficient documentation

## 2015-05-27 DIAGNOSIS — D509 Iron deficiency anemia, unspecified: Secondary | ICD-10-CM | POA: Insufficient documentation

## 2015-05-27 DIAGNOSIS — G500 Trigeminal neuralgia: Secondary | ICD-10-CM | POA: Diagnosis not present

## 2015-05-27 DIAGNOSIS — E039 Hypothyroidism, unspecified: Secondary | ICD-10-CM | POA: Diagnosis not present

## 2015-05-27 DIAGNOSIS — F329 Major depressive disorder, single episode, unspecified: Secondary | ICD-10-CM | POA: Diagnosis not present

## 2015-05-27 DIAGNOSIS — K589 Irritable bowel syndrome without diarrhea: Secondary | ICD-10-CM | POA: Insufficient documentation

## 2015-05-27 DIAGNOSIS — F419 Anxiety disorder, unspecified: Secondary | ICD-10-CM | POA: Diagnosis not present

## 2015-05-27 DIAGNOSIS — E7800 Pure hypercholesterolemia, unspecified: Secondary | ICD-10-CM | POA: Diagnosis not present

## 2015-05-27 DIAGNOSIS — Z886 Allergy status to analgesic agent status: Secondary | ICD-10-CM | POA: Insufficient documentation

## 2015-05-27 DIAGNOSIS — G43911 Migraine, unspecified, intractable, with status migrainosus: Secondary | ICD-10-CM

## 2015-05-27 DIAGNOSIS — K219 Gastro-esophageal reflux disease without esophagitis: Secondary | ICD-10-CM | POA: Diagnosis present

## 2015-05-27 DIAGNOSIS — G43809 Other migraine, not intractable, without status migrainosus: Secondary | ICD-10-CM

## 2015-05-27 DIAGNOSIS — R51 Headache: Secondary | ICD-10-CM | POA: Diagnosis present

## 2015-05-27 DIAGNOSIS — F32A Depression, unspecified: Secondary | ICD-10-CM | POA: Diagnosis present

## 2015-05-27 DIAGNOSIS — G4700 Insomnia, unspecified: Secondary | ICD-10-CM | POA: Insufficient documentation

## 2015-05-27 DIAGNOSIS — Z8639 Personal history of other endocrine, nutritional and metabolic disease: Secondary | ICD-10-CM | POA: Insufficient documentation

## 2015-05-27 DIAGNOSIS — F418 Other specified anxiety disorders: Secondary | ICD-10-CM | POA: Diagnosis present

## 2015-05-27 MED ORDER — SODIUM CHLORIDE 0.9 % IV BOLUS (SEPSIS)
1000.0000 mL | Freq: Once | INTRAVENOUS | Status: AC
Start: 2015-05-27 — End: 2015-05-28
  Administered 2015-05-27: 1000 mL via INTRAVENOUS

## 2015-05-27 MED ORDER — METOCLOPRAMIDE HCL 5 MG/ML IJ SOLN
10.0000 mg | Freq: Once | INTRAMUSCULAR | Status: AC
  Administered 2015-05-27: 10 mg via INTRAVENOUS
  Filled 2015-05-27: qty 2

## 2015-05-27 MED ORDER — DIPHENHYDRAMINE HCL 50 MG/ML IJ SOLN
25.0000 mg | Freq: Once | INTRAMUSCULAR | Status: AC
  Administered 2015-05-27: 25 mg via INTRAVENOUS
  Filled 2015-05-27: qty 1

## 2015-05-27 MED ORDER — KETOROLAC TROMETHAMINE 15 MG/ML IJ SOLN
15.0000 mg | Freq: Once | INTRAMUSCULAR | Status: AC
  Administered 2015-05-27: 15 mg via INTRAVENOUS
  Filled 2015-05-27: qty 1

## 2015-05-27 NOTE — ED Notes (Signed)
Per ems-- pt c/o migraine that started last night with nausea. Unable to tolerate PO.  Home meds are not helping. Vs stable.

## 2015-05-27 NOTE — ED Notes (Signed)
64 year old female with history of migraines comes in with a frontal headache typical of her migraines which started yesterday. There is associated nausea and vomiting. There is associated photophobia and phonophobia. She took sumatriptan at home without relief. Current, she is resting comfortably and in no acute distress. There are no focal neurologic findings. She will be given a migraine cocktail. On review of her prior records, she has several ED visits for migraines, and has had to be hospitalized for her migraines.  After headache cocktail was given, she had no relief of her pain. She is given a small dose of morphine with no appreciable change. She was given a total of 3 doses of hydromorphone. Even after the 3 doses of hydromorphone, she states her headache was still severe and she did not feel she could comfortably go home. She will be given intravenous valproic acid to see effect gives her relief, but I will plan to admit her for management of her intractable migraine. Case is discussed with Dr. Loleta [facility] of [facility] agrees to admit the patient.  Results for orders placed or performed during the hospital encounter of 15/17/61  Basic metabolic panel  Result Value Ref Range   Sodium 134 (L) 135 - 145 mmol/L   Potassium 3.4 (L) 3.5 - 5.1 mmol/L   Chloride 107 101 - 111 mmol/L   CO2 20 (L) 22 - 32 mmol/L   Glucose, Bld 128 (H) 65 - 99 mg/dL   BUN 11 6 - 20 mg/dL   Creatinine, Ser 0.77 0.44 - 1.00 mg/dL   Calcium 8.3 (L) 8.9 - 10.3 mg/dL   GFR calc non Af Amer >60 >60 mL/min   GFR calc Af Amer >60 >60 mL/min   Anion gap 7 5 - 15  CBC with Differential  Result Value Ref Range   WBC 8.6 4.0 - 10.5 K/uL   RBC 4.49 3.87 - 5.11 MIL/uL   Hemoglobin 10.3 (L) 12.0 - 15.0 g/dL   HCT 34.0 (L) 36.0 - 46.0 %   MCV 75.7 (L) 78.0 - 100.0 fL   MCH 22.9 (L) 26.0 - 34.0 pg   MCHC 30.3 30.0 - 36.0 g/dL   RDW 19.6 (H) 11.5 - 15.5 %   Platelets 308 150 - 400 K/uL   Neutrophils Relative % 91 %   Neutro Abs 7.8 (H) 1.7 - 7.7 K/uL   Lymphocytes Relative 8 %   Lymphs Abs 0.7 0.7 - 4.0 K/uL   Monocytes Relative 1 %   Monocytes Absolute 0.1 0.1 - 1.0 K/uL   Eosinophils Relative 0 %   Eosinophils Absolute 0.0 0.0 - 0.7 K/uL   Basophils Relative 0 %   Basophils Absolute 0.0 0.0 - 0.1 K/uL  TSH  Result Value Ref Range   TSH 0.949 0.350 - 4.500 uIU/mL  T4, free  Result Value Ref Range   Free T4 0.80 0.61 - 1.12 ng/dL  Ferritin  Result Value Ref Range   Ferritin 4 (L) 11 - 307 ng/mL  Iron and TIBC  Result Value Ref Range   Iron 22 (L) 28 - 170 ug/dL   TIBC 486 (H) 250 - 450 ug/dL   Saturation Ratios 5 (L) 10.4 - 31.8 %   UIBC 464 ug/dL     Medical screening examination/treatment/procedure(s) were conducted as a shared visit with non-physician practitioner(s) and myself.  I personally evaluated the patient during the encounter.    Delora Fuel, MD 60/73/71 0626

## 2015-05-28 ENCOUNTER — Encounter (HOSPITAL_COMMUNITY): Payer: Self-pay | Admitting: General Practice

## 2015-05-28 ENCOUNTER — Observation Stay (HOSPITAL_COMMUNITY): Payer: PPO

## 2015-05-28 DIAGNOSIS — Z8639 Personal history of other endocrine, nutritional and metabolic disease: Secondary | ICD-10-CM | POA: Insufficient documentation

## 2015-05-28 DIAGNOSIS — G43911 Migraine, unspecified, intractable, with status migrainosus: Secondary | ICD-10-CM

## 2015-05-28 DIAGNOSIS — G4700 Insomnia, unspecified: Secondary | ICD-10-CM

## 2015-05-28 DIAGNOSIS — R112 Nausea with vomiting, unspecified: Secondary | ICD-10-CM

## 2015-05-28 DIAGNOSIS — D509 Iron deficiency anemia, unspecified: Secondary | ICD-10-CM

## 2015-05-28 DIAGNOSIS — G43919 Migraine, unspecified, intractable, without status migrainosus: Secondary | ICD-10-CM | POA: Diagnosis present

## 2015-05-28 DIAGNOSIS — K219 Gastro-esophageal reflux disease without esophagitis: Secondary | ICD-10-CM | POA: Diagnosis not present

## 2015-05-28 DIAGNOSIS — R1110 Vomiting, unspecified: Secondary | ICD-10-CM | POA: Insufficient documentation

## 2015-05-28 DIAGNOSIS — G43909 Migraine, unspecified, not intractable, without status migrainosus: Secondary | ICD-10-CM | POA: Diagnosis present

## 2015-05-28 DIAGNOSIS — F329 Major depressive disorder, single episode, unspecified: Secondary | ICD-10-CM

## 2015-05-28 LAB — BASIC METABOLIC PANEL
Anion gap: 7 (ref 5–15)
BUN: 11 mg/dL (ref 6–20)
CALCIUM: 8.3 mg/dL — AB (ref 8.9–10.3)
CO2: 20 mmol/L — ABNORMAL LOW (ref 22–32)
CREATININE: 0.77 mg/dL (ref 0.44–1.00)
Chloride: 107 mmol/L (ref 101–111)
GFR calc non Af Amer: >60 mL/min (ref >60)
Glucose, Bld: 128 mg/dL — ABNORMAL HIGH (ref 65–99)
Potassium: 3.4 mmol/L — ABNORMAL LOW (ref 3.5–5.1)
SODIUM: 134 mmol/L — AB (ref 135–145)

## 2015-05-28 LAB — CBC WITH DIFFERENTIAL/PLATELET
BASOS PCT: 0 %
Basophils Absolute: 0 10*3/uL (ref 0.0–0.1)
EOS ABS: 0 10*3/uL (ref 0.0–0.7)
EOS PCT: 0 %
HCT: 34 % — ABNORMAL LOW (ref 36.0–46.0)
HEMOGLOBIN: 10.3 g/dL — AB (ref 12.0–15.0)
Lymphocytes Relative: 8 %
Lymphs Abs: 0.7 10*3/uL (ref 0.7–4.0)
MCH: 22.9 pg — AB (ref 26.0–34.0)
MCHC: 30.3 g/dL (ref 30.0–36.0)
MCV: 75.7 fL — ABNORMAL LOW (ref 78.0–100.0)
Monocytes Absolute: 0.1 10*3/uL (ref 0.1–1.0)
Monocytes Relative: 1 %
NEUTROS PCT: 91 %
Neutro Abs: 7.8 10*3/uL — ABNORMAL HIGH (ref 1.7–7.7)
PLATELETS: 308 10*3/uL (ref 150–400)
RBC: 4.49 MIL/uL (ref 3.87–5.11)
RDW: 19.6 % — ABNORMAL HIGH (ref 11.5–15.5)
WBC: 8.6 10*3/uL (ref 4.0–10.5)

## 2015-05-28 LAB — IRON AND TIBC
Iron: 22 ug/dL — ABNORMAL LOW (ref 28–170)
SATURATION RATIOS: 5 % — AB (ref 10.4–31.8)
TIBC: 486 ug/dL — AB (ref 250–450)
UIBC: 464 ug/dL

## 2015-05-28 LAB — FERRITIN: Ferritin: 4 ng/mL — ABNORMAL LOW (ref 11–307)

## 2015-05-28 LAB — T4, FREE: FREE T4: 0.8 ng/dL (ref 0.61–1.12)

## 2015-05-28 LAB — TSH: TSH: 0.949 u[IU]/mL (ref 0.350–4.500)

## 2015-05-28 MED ORDER — TRAZODONE HCL 50 MG PO TABS
50.0000 mg | ORAL_TABLET | Freq: Every day | ORAL | Status: DC
  Administered 2015-05-28: 50 mg via ORAL
  Filled 2015-05-28: qty 1

## 2015-05-28 MED ORDER — SUMATRIPTAN SUCCINATE 100 MG PO TABS
100.0000 mg | ORAL_TABLET | ORAL | Status: DC | PRN
  Administered 2015-05-28 (×2): 100 mg via ORAL
  Filled 2015-05-28 (×4): qty 1

## 2015-05-28 MED ORDER — METHADONE HCL 10 MG PO TABS
5.0000 mg | ORAL_TABLET | Freq: Every day | ORAL | Status: DC
  Administered 2015-05-29: 5 mg via ORAL
  Filled 2015-05-28 (×2): qty 1

## 2015-05-28 MED ORDER — OXYCODONE HCL 5 MG PO TABS: 5.0000 mg | ORAL_TABLET | ORAL | Status: DC | PRN

## 2015-05-28 MED ORDER — MORPHINE SULFATE (PF) 2 MG/ML IV SOLN
2.0000 mg | Freq: Once | INTRAVENOUS | Status: AC
  Administered 2015-05-28: 2 mg via INTRAVENOUS
  Filled 2015-05-28: qty 1

## 2015-05-28 MED ORDER — TOPIRAMATE 25 MG PO TABS
50.0000 mg | ORAL_TABLET | Freq: Two times a day (BID) | ORAL | Status: DC
  Administered 2015-05-28 – 2015-05-29 (×2): 50 mg via ORAL
  Filled 2015-05-28 (×4): qty 2

## 2015-05-28 MED ORDER — HYDROMORPHONE HCL 1 MG/ML IJ SOLN
1.0000 mg | Freq: Once | INTRAMUSCULAR | Status: AC
  Administered 2015-05-28: 1 mg via INTRAVENOUS
  Filled 2015-05-28: qty 1

## 2015-05-28 MED ORDER — ONDANSETRON HCL 4 MG PO TABS: 4.0000 mg | ORAL_TABLET | Freq: Four times a day (QID) | ORAL | Status: DC | PRN

## 2015-05-28 MED ORDER — GABAPENTIN 300 MG PO CAPS
300.0000 mg | ORAL_CAPSULE | Freq: Three times a day (TID) | ORAL | Status: DC
  Administered 2015-05-28 – 2015-05-29 (×3): 300 mg via ORAL
  Filled 2015-05-28 (×4): qty 1

## 2015-05-28 MED ORDER — ARIPIPRAZOLE 5 MG PO TABS
5.0000 mg | ORAL_TABLET | Freq: Every day | ORAL | Status: DC
  Administered 2015-05-29: 5 mg via ORAL
  Filled 2015-05-28 (×2): qty 1

## 2015-05-28 MED ORDER — LEVOTHYROXINE SODIUM 75 MCG PO TABS
75.0000 ug | ORAL_TABLET | Freq: Every day | ORAL | Status: DC
  Administered 2015-05-29: 75 ug via ORAL
  Filled 2015-05-28 (×2): qty 1

## 2015-05-28 MED ORDER — ONDANSETRON HCL 4 MG PO TABS
4.0000 mg | ORAL_TABLET | Freq: Four times a day (QID) | ORAL | Status: DC
  Administered 2015-05-28 – 2015-05-29 (×2): 4 mg via ORAL
  Filled 2015-05-28 (×4): qty 1

## 2015-05-28 MED ORDER — SODIUM CHLORIDE 0.9 % IV BOLUS (SEPSIS)
1000.0000 mL | Freq: Once | INTRAVENOUS | Status: AC
  Administered 2015-05-28: 1000 mL via INTRAVENOUS

## 2015-05-28 MED ORDER — SPIRONOLACTONE 25 MG PO TABS
100.0000 mg | ORAL_TABLET | Freq: Every day | ORAL | Status: DC
  Administered 2015-05-29: 100 mg via ORAL
  Filled 2015-05-28 (×2): qty 4

## 2015-05-28 MED ORDER — ESTRADIOL 1 MG PO TABS
1.0000 mg | ORAL_TABLET | Freq: Every day | ORAL | Status: DC
  Administered 2015-05-29: 1 mg via ORAL
  Filled 2015-05-28 (×2): qty 1

## 2015-05-28 MED ORDER — VALPROATE SODIUM 500 MG/5ML IV SOLN: 500.0000 mg | Freq: Once | INTRAVENOUS | Status: DC

## 2015-05-28 MED ORDER — ALPRAZOLAM 0.5 MG PO TABS: 0.5000 mg | ORAL_TABLET | Freq: Two times a day (BID) | ORAL | Status: DC | PRN

## 2015-05-28 MED ORDER — DEXAMETHASONE SODIUM PHOSPHATE 10 MG/ML IJ SOLN
10.0000 mg | Freq: Once | INTRAMUSCULAR | Status: AC
  Administered 2015-05-28: 10 mg via INTRAVENOUS
  Filled 2015-05-28: qty 1

## 2015-05-28 MED ORDER — SODIUM CHLORIDE 0.9 % IV SOLN: INTRAVENOUS | Status: AC

## 2015-05-28 MED ORDER — DOXEPIN HCL 25 MG PO CAPS
25.0000 mg | ORAL_CAPSULE | Freq: Every evening | ORAL | Status: DC | PRN
  Filled 2015-05-28: qty 1

## 2015-05-28 MED ORDER — SODIUM CHLORIDE 0.9 % IV SOLN
INTRAVENOUS | Status: DC
  Administered 2015-05-28: 09:00:00 via INTRAVENOUS

## 2015-05-28 MED ORDER — VALPROATE SODIUM 500 MG/5ML IV SOLN
500.0000 mg | Freq: Once | INTRAVENOUS | Status: AC
  Administered 2015-05-28: 500 mg via INTRAVENOUS
  Filled 2015-05-28: qty 5

## 2015-05-28 MED ORDER — BUTALBITAL-APAP-CAFFEINE 50-325-40 MG PO TABS: 2.0000 | ORAL_TABLET | Freq: Four times a day (QID) | ORAL | Status: DC | PRN

## 2015-05-28 MED ORDER — ENOXAPARIN SODIUM 40 MG/0.4ML ~~LOC~~ SOLN
40.0000 mg | SUBCUTANEOUS | Status: DC
  Administered 2015-05-28: 40 mg via SUBCUTANEOUS
  Filled 2015-05-28: qty 0.4

## 2015-05-28 MED ORDER — PANTOPRAZOLE SODIUM 40 MG PO TBEC
40.0000 mg | DELAYED_RELEASE_TABLET | Freq: Every day | ORAL | Status: DC
  Administered 2015-05-29: 40 mg via ORAL
  Filled 2015-05-28 (×2): qty 1

## 2015-05-28 MED ORDER — TRAMADOL HCL 50 MG PO TABS
50.0000 mg | ORAL_TABLET | Freq: Three times a day (TID) | ORAL | Status: DC | PRN
  Administered 2015-05-29: 50 mg via ORAL
  Filled 2015-05-28: qty 1

## 2015-05-28 MED ORDER — CITALOPRAM HYDROBROMIDE 20 MG PO TABS
10.0000 mg | ORAL_TABLET | Freq: Every day | ORAL | Status: DC
  Administered 2015-05-28: 10 mg via ORAL
  Filled 2015-05-28: qty 1

## 2015-05-28 MED ORDER — ONDANSETRON HCL 4 MG/2ML IJ SOLN
4.0000 mg | Freq: Four times a day (QID) | INTRAMUSCULAR | Status: DC | PRN
Start: 2015-05-28 — End: 2015-05-29
  Administered 2015-05-28 (×2): 4 mg via INTRAVENOUS
  Filled 2015-05-28 (×2): qty 2

## 2015-05-28 NOTE — Discharge Instructions (Signed)
Return if your headache is not being adequately controlled at home.  Migraine Headache A migraine headache is an intense, throbbing pain on one or both sides of your head. A migraine can last for 30 minutes to several hours. CAUSES  The exact cause of a migraine headache is not always known. However, a migraine may be caused when nerves in the brain become irritated and release chemicals that cause inflammation. This causes pain. Certain things may also trigger migraines, such as:  Alcohol.  Smoking.  Stress.  Menstruation.  Aged cheeses.  Foods or drinks that contain nitrates, glutamate, aspartame, or tyramine.  Lack of sleep.  Chocolate.  Caffeine.  Hunger.  Physical exertion.  Fatigue.  Medicines used to treat chest pain (nitroglycerine), birth control pills, estrogen, and some blood pressure medicines. SIGNS AND SYMPTOMS  Pain on one or both sides of your head.  Pulsating or throbbing pain.  Severe pain that prevents daily activities.  Pain that is aggravated by any physical activity.  Nausea, vomiting, or both.  Dizziness.  Pain with exposure to bright lights, loud noises, or activity.  General sensitivity to bright lights, loud noises, or smells. Before you get a migraine, you may get warning signs that a migraine is coming (aura). An aura may include:  Seeing flashing lights.  Seeing bright spots, halos, or zigzag lines.  Having tunnel vision or blurred vision.  Having feelings of numbness or tingling.  Having trouble talking.  Having muscle weakness. DIAGNOSIS  A migraine headache is often diagnosed based on:  Symptoms.  Physical exam.  A CT scan or MRI of your head. These imaging tests cannot diagnose migraines, but they can help rule out other causes of headaches. TREATMENT Medicines may be given for pain and nausea. Medicines can also be given to help prevent recurrent migraines.  HOME CARE INSTRUCTIONS  Only take over-the-counter  or prescription medicines for pain or discomfort as directed by your health care provider. The use of long-term narcotics is not recommended.  Lie down in a dark, quiet room when you have a migraine.  Keep a journal to find out what may trigger your migraine headaches. For example, write down:  What you eat and drink.  How much sleep you get.  Any change to your diet or medicines.  Limit alcohol consumption.  Quit smoking if you smoke.  Get 7-9 hours of sleep, or as recommended by your health care provider.  Limit stress.  Keep lights dim if bright lights bother you and make your migraines worse. SEEK IMMEDIATE MEDICAL CARE IF:   Your migraine becomes severe.  You have a fever.  You have a stiff neck.  You have vision loss.  You have muscular weakness or loss of muscle control.  You start losing your balance or have trouble walking.  You feel faint or pass out.  You have severe symptoms that are different from your first symptoms. MAKE SURE YOU:   Understand these instructions.  Will watch your condition.  Will get help right away if you are not doing well or get worse.   This information is not intended to replace advice given to you by your health care provider. Make sure you discuss any questions you have with your health care provider.   Document Released: 07/18/2005 Document Revised: 08/08/2014 Document Reviewed: 03/25/2013 Elsevier Interactive Patient Education 2016 [employer].  Oxycodone tablets or capsules What is this medicine? OXYCODONE (ox i KOE done) is a pain reliever. It is used to treat  moderate to severe pain. This medicine may be used for other purposes; ask your health care provider or pharmacist if you have questions. What should I tell my health care provider before I take this medicine? They need to know if you have any of these conditions: -Addison's disease -brain tumor -head injury -heart disease -history of drug or alcohol abuse  problem -if you often drink alcohol -kidney disease -liver disease -lung or breathing disease, like asthma -mental illness -pancreatic disease -seizures -thyroid disease -an unusual or allergic reaction to oxycodone, codeine, hydrocodone, morphine, other medicines, foods, dyes, or preservatives -pregnant or trying to get pregnant -breast-feeding How should I use this medicine? Take this medicine by mouth with a glass of water. Follow the directions on the prescription label. You can take it with or without food. If it upsets your stomach, take it with food. Take your medicine at regular intervals. Do not take it more often than directed. Do not stop taking except on your doctor's advice. Some brands of this medicine, like Oxecta, have special instructions. Ask your doctor or pharmacist if these directions are for you: Do not cut, crush or chew this medicine. Swallow only one tablet at a time. Do not wet, soak, or lick the tablet before you take it. Talk to your pediatrician regarding the use of this medicine in children. Special care may be needed. Overdosage: If you think you have taken too much of this medicine contact a poison control center or emergency room at once. NOTE: This medicine is only for you. Do not share this medicine with others. What if I miss a dose? If you miss a dose, take it as soon as you can. If it is almost time for your next dose, take only that dose. Do not take double or extra doses. What may interact with this medicine? -alcohol -antihistamines -certain medicines used for nausea like chlorpromazine, droperidol -erythromycin -ketoconazole -medicines for depression, anxiety, or psychotic disturbances -medicines for sleep -muscle relaxants -naloxone -naltrexone -narcotic medicines (opiates) for pain -nilotinib -phenobarbital -phenytoin -rifampin -ritonavir -voriconazole This list may not describe all possible interactions. Give your health care provider  a list of all the medicines, herbs, non-prescription drugs, or dietary supplements you use. Also tell them if you smoke, drink alcohol, or use illegal drugs. Some items may interact with your medicine. What should I watch for while using this medicine? Tell your doctor or health care professional if your pain does not go away, if it gets worse, or if you have new or a different type of pain. You may develop tolerance to the medicine. Tolerance means that you will need a higher dose of the medicine for pain relief. Tolerance is normal and is expected if you take this medicine for a long time. Do not suddenly stop taking your medicine because you may develop a severe reaction. Your body becomes used to the medicine. This does NOT mean you are addicted. Addiction is a behavior related to getting and using a drug for a non-medical reason. If you have pain, you have a medical reason to take pain medicine. Your doctor will tell you how much medicine to take. If your doctor wants you to stop the medicine, the dose will be slowly lowered over time to avoid any side effects. You may get drowsy or dizzy when you first start taking this medicine or change doses. Do not drive, use machinery, or do anything that may be dangerous until you know how the medicine affects you. Stand  or sit up slowly. There are different types of narcotic medicines (opiates) for pain. If you take more than one type at the same time, you may have more side effects. Give your health care provider a list of all medicines you use. Your doctor will tell you how much medicine to take. Do not take more medicine than directed. Call emergency for help if you have problems breathing. This medicine will cause constipation. Try to have a bowel movement at least every 2 to 3 days. If you do not have a bowel movement for 3 days, call your doctor or health care professional. Your mouth may get dry. Drinking water, chewing sugarless gum, or sucking on hard  candy may help. See your dentist every 6 months. What side effects may I notice from receiving this medicine? Side effects that you should report to your doctor or health care professional as soon as possible: -allergic reactions like skin rash, itching or hives, swelling of the face, lips, or tongue -breathing problems -confusion -feeling faint or lightheaded, falls -trouble passing urine or change in the amount of urine -unusually weak or tired Side effects that usually do not require medical attention (report to your doctor or health care professional if they continue or are bothersome): -constipation -dry mouth -itching -nausea, vomiting -upset stomach This list may not describe all possible side effects. Call your doctor for medical advice about side effects. You may report side effects to FDA at 1-800-FDA-1088. Where should I keep my medicine? Keep out of the reach of children. This medicine can be abused. Keep your medicine in a safe place to protect it from theft. Do not share this medicine with anyone. Selling or giving away this medicine is dangerous and against the law. Store at room temperature between 15 and 30 degrees C (59 and 86 degrees F). Protect from light. Keep container tightly closed. This medicine may cause accidental overdose and death if it is taken by other adults, children, or pets. Flush any unused medicine down the toilet to reduce the chance of harm. Do not use the medicine after the expiration date. NOTE: This sheet is a summary. It may not cover all possible information. If you have questions about this medicine, talk to your doctor, pharmacist, or health care provider.    2016, Elsevier/Gold Standard. (2014-11-29 01:15:14)

## 2015-05-28 NOTE — Progress Notes (Signed)
Pt requested for her iv to be removed and iv fluid discontinued with her reason that she is not dehydrated, pt request has been done and MD on call paged about it, will continue to monitor her

## 2015-05-28 NOTE — ED Notes (Signed)
Phlebotomy at bedside.

## 2015-05-28 NOTE — ED Provider Notes (Signed)
CSN: [id number]     Arrival date & time 05/27/15  2225 History   First MD Initiated Contact with Patient 05/27/15 2239     Chief Complaint  Patient presents with  . Migraine   HPI   64 year old female presents today with migraine. Patient reports symptoms started slowly last night with left-sided throbbing head pain, and associated nausea with several episodes of vomiting today. Patient reports no radiation of symptoms, trauma to the head, changes in small lesion or taste, any focal neurological deficits, fever, neck stiffness, or any other red flags. Patient reports that symptoms are similar to previous migraines, usually she gets minor headaches that are resolved, but every so often gets a migraine as significant as this. She has been seen by [facility], most recently in 04/15/2015. Currently taking gabapentin twice daily, Imitrex, Topamax. Patient received a CT scan in January 2016 for severe headache, with no acute intracranial abnormalities. She received a CT scan on 06/29/2014, 06/24/2014, 06/17/2014, with numerous other CT scans in the year 2015 and 16.  Past Medical History  Diagnosis Date  . Other constipation   . Unspecified hypothyroidism   . IBS (irritable bowel syndrome)   . PUD (peptic ulcer disease)   . Chronic pain     scoliosis  . Parotid tumor   . Anemia   . Anxiety   . Blood transfusion   . Cataract   . GERD (gastroesophageal reflux disease)   . Osteoporosis   . H/O hiatal hernia   . Trigeminal neuralgia   . S/P radiation therapy   . PONV (postoperative nausea and vomiting)     hx of " getting too much" - 1998, slow to take up by body   . Chronic kidney disease     hx of uti   . Depression   . Migraines   . High cholesterol   . 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 Hypersomnia   . Parotid tumor   . Ulcer   . Ataxia 03/24/2014  . Accidental ASA poisoning 08/2014   Past Surgical History  Procedure Laterality Date  .  Cholecystectomy    . Abdominal hysterectomy    . Right knee surgery    . L5 surgery    . Heary surgery      correct hole in the heart  . Left ankle    . Back surgery    . Left shoulder surgery     . Cut muscles and nerves in head       to stop migraines and it did not work   . Heart outpaitent surgery       to correct hole in heart   . Brain surgery      parotid tumor removed   . Esophagogastroduodenoscopy  06/12/2012    Procedure: ESOPHAGOGASTRODUODENOSCOPY (EGD);  Surgeon: Lear Ng, MD;  Location: [facility];  Service: Endoscopy;  Laterality: N/A;  . Gamma knife for trig neuralgia  12/27/11  . Parotio gland      mass on gland removed, benign  . Mass excision  2012    for degenerative disk disease  . Shoulder surgery Right 11-2014  . Aspirin poisoning  09/2014  . Abdominal hernia repair  05-14-15    Dr. Redmond Pulling to operate   Family History  Problem Relation Age of Onset  . Ovarian cancer Mother   . Migraines Mother   . Heart disease Maternal Grandfather   . Diabetes Father   .  Migraines Brother   . Heart disease Other    Social History  Substance Use Topics  . Smoking status: Never Smoker   . Smokeless tobacco: Never Used  . Alcohol Use: No   OB History    No data available     Review of Systems  All other systems reviewed and are negative.   Allergies  Acetaminophen  Home Medications   Prior to Admission medications   Medication Sig Start Date End Date Taking? Authorizing Provider  ALPRAZolam Duanne Moron) 1 MG tablet TABLET BY MOUTH TWICE A DAY AS NEEDED FOR ANXIETY 11/27/14   Larey Seat, MD  ARIPiprazole (ABILIFY) 5 MG tablet Take 1 tablet (5 mg total) by mouth daily. Patient taking differently: Take 2.5 mg by mouth daily.  08/29/14   Shanker Kristeen Mans, MD  citalopram (CELEXA) 10 MG tablet Take 10 mg by mouth at bedtime.    Historical Provider, MD  estradiol (ESTRACE) 1 MG tablet Take 1 mg by mouth daily.  04/21/13   Historical Provider, MD  FIBER  SELECT GUMMIES PO Take 1 tablet by mouth daily.     Historical Provider, MD  gabapentin (NEURONTIN) 300 MG capsule TAKE 1 CAPSULE (300 MG TOTAL) BY MOUTH 3 (THREE) TIMES DAILY. 04/15/15   Dennie Bible, NP  levothyroxine (SYNTHROID, LEVOTHROID) 75 MCG tablet Take 75 mcg by mouth daily before breakfast.     Historical Provider, MD  methadone (DOLOPHINE) 5 MG tablet Take 5 mg by mouth daily. 03/22/15   Historical Provider, MD  nystatin (MYCOSTATIN) 100000 UNIT/ML suspension SWISH AND SPIT WITH 1 TEASPOON 4 TIMES A DAY 02/18/15   Historical Provider, MD  ondansetron (ZOFRAN) 4 MG tablet Take 1 tablet (4 mg total) by mouth every 6 (six) hours. Patient taking differently: Take 4 mg by mouth every 8 (eight) hours as needed.  06/29/14   Cleatrice Burke, PA-C  pantoprazole (PROTONIX) 40 MG tablet Take 40 mg by mouth 2 (two) times daily.     Historical Provider, MD  pilocarpine (SALAGEN) 5 MG tablet TAKE 1/2-1 TABLET BY MOUTH 4 TIMES DAILY AS NEEDED FOR DRY MOUTH 02/17/15   Historical Provider, MD  spironolactone (ALDACTONE) 100 MG tablet Take 100 mg by mouth daily before breakfast.     Historical Provider, MD  SUMAtriptan (IMITREX) 100 MG tablet TAKE 1 TABLET MAY REPEAT IN 2 HOURS IF HEADACHE PERSIST OR RECURES 05/17/15   Asencion Partridge Dohmeier, MD  temazepam (RESTORIL) 15 MG capsule Take 45 mg by mouth at bedtime. 03/27/15   Historical Provider, MD  topiramate (TOPAMAX) 25 MG tablet TAKE 2 TABLETS (50 MG TOTAL) BY MOUTH 2 (TWO) TIMES DAILY. 04/15/15   Dennie Bible, NP  valACYclovir (VALTREX) 500 MG tablet Take 500 mg by mouth daily.    Historical Provider, MD   BP 124/85 mmHg  Pulse 90  Temp(Src) 99 F (37.2 C) (Oral)  SpO2 100%   Physical Exam  Constitutional: She is oriented to person, place, and time. She appears well-developed and well-nourished. No distress.  HENT:  Head: Normocephalic and atraumatic.  Eyes: Conjunctivae and EOM are normal. Pupils are equal, round, and reactive to light.  Right eye exhibits no discharge. Left eye exhibits no discharge. No scleral icterus.  Neck: Normal range of motion. Neck supple. No JVD present. No tracheal deviation present.  Pulmonary/Chest: Effort normal. No stridor.  Musculoskeletal: Normal range of motion. She exhibits no edema or tenderness.  Lymphadenopathy:    She has no cervical adenopathy.  Neurological: She is alert  and oriented to person, place, and time. She has normal strength. She displays no atrophy and no tremor. No cranial nerve deficit or sensory deficit. She exhibits normal muscle tone. She displays a negative Romberg sign. She displays no seizure activity. Coordination and gait normal. GCS eye subscore is 4. GCS verbal subscore is 5. GCS motor subscore is 6.  Reflex Scores:      Patellar reflexes are 2+ on the right side and 2+ on the left side. Skin: She is not diaphoretic.  Psychiatric: She has a normal mood and affect. Her behavior is normal. Judgment and thought content normal.  Nursing note and vitals reviewed.   ED Course  Procedures (including critical care time) Labs Review Labs Reviewed - No data to display  Imaging Review No results found. I have personally reviewed and evaluated these images and lab results as part of my medical decision-making.   EKG Interpretation None      MDM   Final diagnoses:  Intractable migraine with status migrainosus, unspecified migraine type    Labs:  Imaging:  Consults:  Therapeutics: Normal saline, Benadryl, Reglan, Toradol  Discharge Meds:   Assessment/Plan: Patient's presentation most consistent with chronic migraines. She has a extensive history of the same with numerous procedures to improve quality of life. Patient has seen a neurologist within the last month, she has no red flags on today's exam that would necessitate emergent imaging. Patient was treated here in the ED with above medications with symptomatic improvement. Patient discharged home with  instructions to follow-up with neurologist for further evaluation and management. She was given strict precautions.         Okey Regal, PA-C 05/30/15 [place], MD 06/02/15 1046

## 2015-05-28 NOTE — H&P (Signed)
[facility] History and Physical  Caitlin Irwin:[id number] DOB: 06/22/1951 DOA: 05/27/2015  Referring physician: Emergency Department PCP: Criselda Peaches, MD   CHIEF COMPLAINT:  migraine   HPI: Caitlin Irwin is a 64 y.o. female with a history of .trigeminal neuralgia. She underwent gamma knife procedure in 2013 without much improvement in facial pain or headaches. She had a sub occipital craniotomy for microvascular decompression of the left trigeminal motor  In 2013. longstanding history of migraine headaches since age 27. She averages about 1-2 severe migraines a month. Generally the pain starts across her forehead then eventually settles in one of her eyes. Patient gets headaches on a regular basis  (three times a weeks). She takes Ibuprofen on a daily basis and is usually able to stave of Migraines with Sumatriptan. Migraines frequently associated with nausea and vomiting Patient is followed by neurology. Several head CTscans for headaches / migraines have been negative.   24-36 hours ago patient awoke with a severe headache across her forehead. She subsequently developed nausea, vomiting, and pain settled left eye. Patient tried sumatriptan but pain escalated and she developed nausea and vomiting. Light makes pain worse. No associated visual disturbances.    No other medical complaints.   ED COURSE:     Labs:   WBC 8.6,  hgb 10.3, MCV 75,  BUN 11,  Cr  0.77,       Meds administer in ED since arrival last night:  Medications  valproate (DEPACON) 500 mg in dextrose 5 % 50 mL IVPB (not administered)  sodium chloride 0.9 % bolus 1,000 mL (0 mLs Intravenous Stopped 05/28/15 0319)  diphenhydrAMINE (BENADRYL) injection 25 mg (25 mg Intravenous Given 05/27/15 2353)  metoCLOPramide (REGLAN) injection 10 mg (10 mg Intravenous Given 05/27/15 2354)  ketorolac (TORADOL) 15 MG/ML injection 15 mg (15 mg Intravenous Given 05/27/15 2355)  morphine 2 MG/ML injection 2 mg (2 mg  Intravenous Given 05/28/15 0106)  HYDROmorphone (DILAUDID) injection 1 mg (1 mg Intravenous Given 05/28/15 0202)  dexamethasone (DECADRON) injection 10 mg (10 mg Intravenous Given 05/28/15 0202)  HYDROmorphone (DILAUDID) injection 1 mg (1 mg Intravenous Given 05/28/15 0319)  HYDROmorphone (DILAUDID) injection 1 mg (1 mg Intravenous Given 05/28/15 0504)  sodium chloride 0.9 % bolus 1,000 mL (1,000 mLs Intravenous New Bag/Given 05/28/15 0503)    REVIEW OF SYSTEMS:  Review of Systems  Constitutional: Negative.   Eyes: Positive for photophobia.  Respiratory: Negative.   Cardiovascular: Negative.   Gastrointestinal: Negative.   Genitourinary: Negative.   Musculoskeletal: Negative.   Skin: Negative.   Neurological: Positive for headaches.  Endo/Heme/Allergies: Negative.   Psychiatric/Behavioral: Negative.      Past Medical History  Diagnosis Date  . Other constipation   . Unspecified hypothyroidism   . IBS (irritable bowel syndrome)   . PUD (peptic ulcer disease)   . Chronic pain     scoliosis  . Parotid tumor   . Anemia   . Anxiety   . Blood transfusion   . Cataract   . GERD (gastroesophageal reflux disease)   . Osteoporosis   . H/O hiatal hernia   . Trigeminal neuralgia   . S/P radiation therapy   . PONV (postoperative nausea and vomiting)     hx of " getting too much" - 1998, slow to take up by body   . Chronic kidney disease     hx of uti   . Depression   . Migraines   . High cholesterol   . Neuralgia  radiation therapy for tri-gen-5/13  . Hypersomnia   . Parotid tumor   . Ulcer   . Ataxia 03/24/2014  . Accidental ASA poisoning 08/2014   Past Surgical History  Procedure Laterality Date  . Cholecystectomy    . Abdominal hysterectomy    . Right knee surgery    . L5 surgery    . Heary surgery      correct hole in the heart  . Left ankle    . Back surgery    . Left shoulder surgery     . Cut muscles and nerves in head       to stop migraines and it did  not work   . Heart outpaitent surgery       to correct hole in heart   . Brain surgery      parotid tumor removed   . Esophagogastroduodenoscopy  06/12/2012    Procedure: ESOPHAGOGASTRODUODENOSCOPY (EGD);  Surgeon: Lear Ng, MD;  Location: [facility];  Service: Endoscopy;  Laterality: N/A;  . Gamma knife for trig neuralgia  12/27/11  . Parotio gland      mass on gland removed, benign  . Mass excision  2012    for degenerative disk disease  . Shoulder surgery Right 11-2014  . Aspirin poisoning  09/2014  . Abdominal hernia repair  05-14-15    Dr. Redmond Pulling to operate    SOCIAL HISTORY:  reports that she has never smoked. She has never used smokeless tobacco. She reports that she does not drink alcohol or use illicit drugs. Lives:  at home With:   alone  Assistive devices:   None needed for ambulation.   Allergies  Allergen Reactions  . Acetaminophen Nausea And Vomiting    Family History  Problem Relation Age of Onset  . Ovarian cancer Mother   . Migraines Mother   . Heart disease Maternal Grandfather   . Diabetes Father   . Migraines Brother   . Heart disease Other     Prior to Admission medications   Medication Sig Start Date End Date Taking? Authorizing Provider  ALPRAZolam Duanne Moron) 1 MG tablet Take 0.5-1 mg by mouth 2 (two) times daily as needed for anxiety.   Yes Historical Provider, MD  ARIPiprazole (ABILIFY) 5 MG tablet Take 1 tablet (5 mg total) by mouth daily. 08/29/14  Yes Shanker Kristeen Mans, MD  citalopram (CELEXA) 10 MG tablet Take 10 mg by mouth at bedtime.   Yes Historical Provider, MD  estradiol (ESTRACE) 1 MG tablet Take 1 mg by mouth daily.  04/21/13  Yes Historical Provider, MD  FIBER SELECT GUMMIES PO Take 1 tablet by mouth daily.    Yes Historical Provider, MD  gabapentin (NEURONTIN) 300 MG capsule TAKE 1 CAPSULE (300 MG TOTAL) BY MOUTH 3 (THREE) TIMES DAILY. 04/15/15  Yes Dennie Bible, NP  levothyroxine (SYNTHROID, LEVOTHROID) 75 MCG tablet  Take 75 mcg by mouth daily before breakfast.    Yes Historical Provider, MD  methadone (DOLOPHINE) 5 MG tablet Take 5 mg by mouth daily. 03/22/15  Yes Historical Provider, MD  nystatin (MYCOSTATIN) 100000 UNIT/ML suspension SWISH AND SPIT WITH 1 TEASPOON 4 TIMES A DAY 02/18/15  Yes Historical Provider, MD  ondansetron (ZOFRAN) 4 MG tablet Take 1 tablet (4 mg total) by mouth every 6 (six) hours. Patient taking differently: Take 4 mg by mouth every 8 (eight) hours as needed.  06/29/14  Yes Cleatrice Burke, PA-C  pantoprazole (PROTONIX) 40 MG tablet Take 40 mg by mouth  daily.    Yes Historical Provider, MD  pilocarpine (SALAGEN) 5 MG tablet TAKE 1/2-1 TABLET BY MOUTH 4 TIMES DAILY AS NEEDED FOR DRY MOUTH 02/17/15  Yes Historical Provider, MD  spironolactone (ALDACTONE) 100 MG tablet Take 100 mg by mouth daily before breakfast.    Yes Historical Provider, MD  SUMAtriptan (IMITREX) 100 MG tablet TAKE 1 TABLET MAY REPEAT IN 2 HOURS IF HEADACHE PERSIST OR RECURES 05/17/15  Yes Carmen Dohmeier, MD  temazepam (RESTORIL) 15 MG capsule Take 45 mg by mouth at bedtime. 03/27/15  Yes Historical Provider, MD  topiramate (TOPAMAX) 25 MG tablet TAKE 2 TABLETS (50 MG TOTAL) BY MOUTH 2 (TWO) TIMES DAILY. 04/15/15  Yes Dennie Bible, NP  traMADol (ULTRAM) 50 MG tablet Take 50-100 mg by mouth every 8 (eight) hours as needed for moderate pain or severe pain.   Yes Historical Provider, MD  traZODone (DESYREL) 100 MG tablet Take 50-100 mg by mouth at bedtime.   Yes Historical Provider, MD  valACYclovir (VALTREX) 500 MG tablet Take 500 mg by mouth daily.   Yes Historical Provider, MD  doxepin (SINEQUAN) 25 MG capsule Take 1-3 capsules by mouth at bedtime as needed. 04/30/15   Historical Provider, MD  oxyCODONE (OXY IR/ROXICODONE) 5 MG immediate release tablet Take 1 tablet (5 mg total) by mouth every 4 (four) hours as needed for moderate pain or severe pain. 67/54/49   Delora Fuel, MD   PHYSICAL EXAM: Filed Vitals:    05/28/15 0330 05/28/15 0400 05/28/15 0430 05/28/15 0500  BP: 122/55 118/65 121/60 119/65  Pulse: 84 86 86 86  Temp:      TempSrc:      SpO2: 96% 98% 98% 97%    Wt Readings from Last 3 Encounters:  04/15/15 53.706 kg (118 lb 6.4 oz)  08/28/14 51.256 kg (113 lb)  06/29/14 43.092 kg (95 lb)    General:  Pleasant white female. Appears calm and comfortable Eyes: PER, normal lids, irises & conjunctiva ENT: grossly normal hearing, lips & tongue Neck: no LAD, no masses Cardiovascular: RRR, no murmurs. No LE edema.  Respiratory: Respirations even and unlabored. Normal respiratory effort. Lungs CTA bilaterally, no wheezes / rales .   Abdomen: soft, non-distended, non-tender, active bowel sounds. No obvious masses.  Skin: no rash seen on limited exam Musculoskeletal: grossly normal tone BUE/BLE Psychiatric: grossly normal mood and affect, speech fluent and appropriate Neurologic: grossly non-focal.         LABS ON ADMISSION:    CBC:  Recent Labs Lab 05/28/15 0637  WBC 8.6  NEUTROABS 7.8*  HGB 10.3*  HCT 34.0*  MCV 75.7*  PLT 308    ASSESSMENT / PLAN    Migraine with nausea / vomiting. Symptoms persistent for 24-36 hours despite dose of steroids, Toradol, dilaudid, morphine and Depacon in ED. Patient on numerous medications at home, including NSAIDs. Wonder if polypharmacy contributing to headaches.   -Will admit to observation. -Will ask Pharmacy to evaluate extensive home med list.  -Obtain ESR -Head CTscan negative. Will obtain MRI /MRA of head. -continue home medications. Will try and avoid narcotics while inpatient as they may cause rebound headaches.    GERD. Continue home PPI  Depression. Continue home anti-depressants.   Hx of hypothyroidism. Not currently on hormone replacement. Will check TSH, Free T4.  Microcytic anemia. No overt GI bleeding. Microcytosis slightly worse than baseline but hgb stable. Patient states she had a colonoscopy without significant  findings a couple of years ago. Unable to find  anything in Epic, she has seen [facility] and [facility] in the past. [facility] records are not in Epic however. Patient states she is supposed to be on iron but is not taking it. Will check iron studies. Microcytic anemia can be followed up as outpatient   CONSULTANTS:   None   Code Status: Limited Code -Do not intubate. DVT Prophylaxis: Lovenox Family Communication: Patient alert, oriented and understands plan of care.  Disposition Plan: Discharge to home in 24 hours.    Time spent: 60 minutes Tye Savoy  NP [facility] Pager [phone]

## 2015-05-28 NOTE — Progress Notes (Signed)
Patient refused 10 am medications.  MD made aware

## 2015-05-28 NOTE — ED Notes (Signed)
Pt up for discharge. Pt still not feeling well. sts migraine is starting to come back stronger again. EDP made aware. Orders to follow.

## 2015-05-28 NOTE — Progress Notes (Signed)
New Admission Note:  Arrival Method: ED Mental Orientation: a&O Telemetry: none Assessment: Completed Skin: no skin issues IV: rt AC Pain: 0 Tubes: none Safety Measures: Safety Fall Prevention Plan was given, discussed and signed. Admission: Completed [facility] Orientation: Patient has been orientated to the room, unit and the staff. Family: none  Orders have been reviewed and implemented. Will continue to monitor the patient. Call light has been placed within reach and bed alarm has been activated.   Fabian Sharp, RN  Phone Number: [phone]

## 2015-05-28 NOTE — Telephone Encounter (Signed)
Error

## 2015-05-28 NOTE — Care Management Note (Signed)
Case Management Note  Patient Details  Name: CLYDE UPSHAW MRN: [id number] Date of Birth: Nov 18, 1950  Subjective/Objective:    Date:05/28/15 Spoke with patient at the bedside. Introduced self as Tourist information centre manager and explained role in discharge planning and how to be reached. Verified [place] , [place]. Expressed potential need for no other DME. Verified patient anticipates to go home at time of discharge and will have  part-time supervision by family, neighbors if needed at this time to best of their knowledge. Patient  denied needing help with their medication. Patient drives  to MD appointments. Verified patient has PCP Zada Girt.  Patient states she plans to take a cab home at dc which she will arrange or her sister n law will come to pick her up.   Plan: CM will continue to follow for discharge planning and [facility] resources.                 Action/Plan:   Expected Discharge Date:                  Expected Discharge Plan:  Home/Self Care  In-House Referral:     Discharge planning Services  CM Consult  Post Acute Care Choice:    Choice offered to:     DME Arranged:    DME Agency:     HH Arranged:    [place] Agency:     Status of Service:  Completed, signed off  Medicare Important Message Given:    Date Medicare IM Given:    Medicare IM give by:    Date Additional Medicare IM Given:    Additional Medicare Important Message give by:     If discussed at [place] of Stay Meetings, dates discussed:    Additional Comments:  Zenon Mayo, RN 05/28/2015, 2:01 PM

## 2015-05-28 NOTE — Progress Notes (Signed)
Patient requested something for pain IV.  Paged MD for orders

## 2015-05-28 NOTE — Progress Notes (Addendum)
Patient arrived to unit 5W24. Patient complaining of  Nausea and stating "I need something for my nausea I can not hold down pills". NP Chester Holstein paged to make aware of patient complaints.   8:24 AM Dr. Tamala Julian paged to make aware of nausea. Telephone order received.

## 2015-05-29 DIAGNOSIS — K219 Gastro-esophageal reflux disease without esophagitis: Secondary | ICD-10-CM | POA: Diagnosis not present

## 2015-05-29 DIAGNOSIS — G43911 Migraine, unspecified, intractable, with status migrainosus: Secondary | ICD-10-CM | POA: Diagnosis not present

## 2015-05-29 DIAGNOSIS — F329 Major depressive disorder, single episode, unspecified: Secondary | ICD-10-CM | POA: Diagnosis not present

## 2015-05-29 DIAGNOSIS — G4700 Insomnia, unspecified: Secondary | ICD-10-CM | POA: Diagnosis not present

## 2015-05-29 LAB — COMPREHENSIVE METABOLIC PANEL
ALK PHOS: 51 U/L (ref 38–126)
ALT: 13 U/L — ABNORMAL LOW (ref 14–54)
ANION GAP: 5 (ref 5–15)
AST: 18 U/L (ref 15–41)
Albumin: 3.1 g/dL — ABNORMAL LOW (ref 3.5–5.0)
BILIRUBIN TOTAL: 0.4 mg/dL (ref 0.3–1.2)
BUN: 10 mg/dL (ref 6–20)
CALCIUM: 8.7 mg/dL — AB (ref 8.9–10.3)
CO2: 23 mmol/L (ref 22–32)
Chloride: 110 mmol/L (ref 101–111)
Creatinine, Ser: 0.9 mg/dL (ref 0.44–1.00)
GFR calc Af Amer: >60 mL/min (ref >60)
Glucose, Bld: 100 mg/dL — ABNORMAL HIGH (ref 65–99)
POTASSIUM: 3.3 mmol/L — AB (ref 3.5–5.1)
Sodium: 138 mmol/L (ref 135–145)
TOTAL PROTEIN: 5.4 g/dL — AB (ref 6.5–8.1)

## 2015-05-29 LAB — MAGNESIUM: MAGNESIUM: 2 mg/dL (ref 1.7–2.4)

## 2015-05-29 MED ORDER — POTASSIUM CHLORIDE CRYS ER 20 MEQ PO TBCR
60.0000 meq | EXTENDED_RELEASE_TABLET | Freq: Once | ORAL | Status: AC
  Administered 2015-05-29: 60 meq via ORAL
  Filled 2015-05-29: qty 3

## 2015-05-29 NOTE — Care Management Note (Signed)
Case Management Note  Patient Details  Name: Caitlin Irwin MRN: [id number] Date of Birth: [birth year]  Subjective/Objective:       Patient for dc, no needs.             Action/Plan:   Expected Discharge Date:                  Expected Discharge Plan:  Home/Self Care  In-House Referral:     Discharge planning Services  CM Consult  Post Acute Care Choice:    Choice offered to:     DME Arranged:    DME Agency:     HH Arranged:    [place] Agency:     Status of Service:  Completed, signed off  Medicare Important Message Given:    Date Medicare IM Given:    Medicare IM give by:    Date Additional Medicare IM Given:    Additional Medicare Important Message give by:     If discussed at [place] of Stay Meetings, dates discussed:    Additional Comments:  Zenon Mayo, RN 05/29/2015, 11:42 AM

## 2015-05-29 NOTE — Progress Notes (Signed)
Pt refused her SCDS to be put on with the reason that she dont have a blood clot, necessary, education given

## 2015-05-29 NOTE — Discharge Summary (Signed)
Physician Discharge Summary  Caitlin Irwin MRN: [id number] DOB/AGE: [birth year] 64 y.o.  PCP: Criselda Peaches, MD   Admit date: 05/27/2015 Discharge date: 05/29/2015  Discharge Diagnoses:     Active Problems:   Esophageal reflux   Depression   Migraine, intractable   Migraine   Headache, migraine   Insomnia   Emesis   History of hypothyroidism   Microcytic hypochromic anemia    Follow-up recommendations Follow-up with PCP in 3-5 days , including all  additional recommended appointments as below Follow-up CBC, CMP in 3-5 days Patient has an  appt with Bartholomew Boards, MD at [facility]      Medication List    TAKE these medications        ALPRAZolam 1 MG tablet  Commonly known as:  XANAX  Take 0.5-1 mg by mouth 2 (two) times daily as needed for anxiety.     ARIPiprazole 5 MG tablet  Commonly known as:  ABILIFY  Take 1 tablet (5 mg total) by mouth daily.     citalopram 10 MG tablet  Commonly known as:  CELEXA  Take 10 mg by mouth at bedtime.     doxepin 25 MG capsule  Commonly known as:  SINEQUAN  Take 1-3 capsules by mouth at bedtime as needed.     estradiol 1 MG tablet  Commonly known as:  ESTRACE  Take 1 mg by mouth daily.     FIBER SELECT GUMMIES PO  Take 1 tablet by mouth daily.     gabapentin 300 MG capsule  Commonly known as:  NEURONTIN  TAKE 1 CAPSULE (300 MG TOTAL) BY MOUTH 3 (THREE) TIMES DAILY.     levothyroxine 75 MCG tablet  Commonly known as:  SYNTHROID, LEVOTHROID  Take 75 mcg by mouth daily before breakfast.     methadone 5 MG tablet  Commonly known as:  DOLOPHINE  Take 5 mg by mouth daily.     nystatin 100000 UNIT/ML suspension  Commonly known as:  MYCOSTATIN  SWISH AND SPIT WITH 1 TEASPOON 4 TIMES A DAY     ondansetron 4 MG tablet  Commonly known as:  ZOFRAN  Take 1 tablet (4 mg total) by mouth every 6 (six) hours.     oxyCODONE 5 MG immediate release tablet  Commonly known as:  Oxy IR/ROXICODONE  Take 1 tablet (5 mg  total) by mouth every 4 (four) hours as needed for moderate pain or severe pain.     pantoprazole 40 MG tablet  Commonly known as:  PROTONIX  Take 40 mg by mouth daily.     pilocarpine 5 MG tablet  Commonly known as:  SALAGEN  TAKE 1/2-1 TABLET BY MOUTH 4 TIMES DAILY AS NEEDED FOR DRY MOUTH     spironolactone 100 MG tablet  Commonly known as:  ALDACTONE  Take 100 mg by mouth daily before breakfast.     SUMAtriptan 100 MG tablet  Commonly known as:  IMITREX  TAKE 1 TABLET MAY REPEAT IN 2 HOURS IF HEADACHE PERSIST OR RECURES     temazepam 15 MG capsule  Commonly known as:  RESTORIL  Take 45 mg by mouth at bedtime.     topiramate 25 MG tablet  Commonly known as:  TOPAMAX  TAKE 2 TABLETS (50 MG TOTAL) BY MOUTH 2 (TWO) TIMES DAILY.     traMADol 50 MG tablet  Commonly known as:  ULTRAM  Take 50-100 mg by mouth every 8 (eight) hours as needed for moderate pain or  severe pain.     traZODone 100 MG tablet  Commonly known as:  DESYREL  Take 50-100 mg by mouth at bedtime.     valACYclovir 500 MG tablet  Commonly known as:  VALTREX  Take 500 mg by mouth daily.         Discharge Condition: *Stable   Discharge Instructions       Discharge Instructions    Diet - low sodium heart healthy    Complete by:  As directed      Increase activity slowly    Complete by:  As directed            Allergies  Allergen Reactions  . Acetaminophen Nausea And Vomiting      Disposition: 01-Home or Self Care   Consults:  Neurology     Significant Diagnostic Studies:  Mr [name] Wo Contrast  05/28/2015  CLINICAL DATA:  64 year old female with Trigeminal neuralgia, migraine, depression, insomnia, who presents with intractable migraine like headache that awakens her from sleep. Initial encounter. EXAM: MRI HEAD WITHOUT CONTRAST MRA HEAD WITHOUT CONTRAST TECHNIQUE: Multiplanar, multiecho pulse sequences of the brain and surrounding structures were obtained without intravenous  contrast. Angiographic images of the head were obtained using MRA technique without contrast. COMPARISON:  CT head most recent 08/27/2014. MR head 03/30/2014. MR head 01/25/2013. FINDINGS: MRI HEAD FINDINGS No evidence for acute infarction, hemorrhage, mass lesion, hydrocephalus, or extra-axial fluid. Normal for age cerebral volume. No significant white matter disease. Sequelae of LEFT suboccipital craniectomy for treatment of LEFT trigeminal neuralgia. Flow voids are maintained. No foci of intra-axial chronic hemorrhage. Unremarkable pituitary and cerebellar tonsils. Mild cervical spondylosis. Extracranial soft tissues unremarkable. Similar appearance to priors. MRA HEAD FINDINGS The internal carotid arteries are widely patent. The basilar artery is widely patent with LEFT vertebral dominant. No intracranial stenosis or aneurysm. IMPRESSION: Sequelae of LEFT suboccipital craniectomy for LEFT trigeminal neuralgia treatment. No acute intracranial findings. Unremarkable MRA intracranial circulation. Electronically Signed   By: Staci Righter M.D.   On: 05/28/2015 12:10   Mr Brain Wo Contrast  05/28/2015  CLINICAL DATA:  64 year old female with Trigeminal neuralgia, migraine, depression, insomnia, who presents with intractable migraine like headache that awakens her from sleep. Initial encounter. EXAM: MRI HEAD WITHOUT CONTRAST MRA HEAD WITHOUT CONTRAST TECHNIQUE: Multiplanar, multiecho pulse sequences of the brain and surrounding structures were obtained without intravenous contrast. Angiographic images of the head were obtained using MRA technique without contrast. COMPARISON:  CT head most recent 08/27/2014. MR head 03/30/2014. MR head 01/25/2013. FINDINGS: MRI HEAD FINDINGS No evidence for acute infarction, hemorrhage, mass lesion, hydrocephalus, or extra-axial fluid. Normal for age cerebral volume. No significant white matter disease. Sequelae of LEFT suboccipital craniectomy for treatment of LEFT trigeminal  neuralgia. Flow voids are maintained. No foci of intra-axial chronic hemorrhage. Unremarkable pituitary and cerebellar tonsils. Mild cervical spondylosis. Extracranial soft tissues unremarkable. Similar appearance to priors. MRA HEAD FINDINGS The internal carotid arteries are widely patent. The basilar artery is widely patent with LEFT vertebral dominant. No intracranial stenosis or aneurysm. IMPRESSION: Sequelae of LEFT suboccipital craniectomy for LEFT trigeminal neuralgia treatment. No acute intracranial findings. Unremarkable MRA intracranial circulation. Electronically Signed   By: Staci Righter M.D.   On: 05/28/2015 12:10        Filed Weights   05/28/15 0820  Weight: 56.246 kg (124 lb)     Microbiology: No results found for this or any previous visit (from the past 240 hour(s)).     Blood Culture  Component Value Date/Time   SDES BLOOD LEFT HAND 11/26/2009 1947   SPECREQUEST BOTTLES DRAWN AEROBIC ONLY 10CC 11/26/2009 1947   CULT NO GROWTH 5 DAYS 11/26/2009 1947   REPTSTATUS 12/03/2009 FINAL 11/26/2009 1947      Labs: Results for orders placed or performed during the hospital encounter of 05/27/15 (from the past 48 hour(s))  Basic metabolic panel     Status: Abnormal   Collection Time: 05/28/15  6:37 AM  Result Value Ref Range   Sodium 134 (L) 135 - 145 mmol/L   Potassium 3.4 (L) 3.5 - 5.1 mmol/L   Chloride 107 101 - 111 mmol/L   CO2 20 (L) 22 - 32 mmol/L   Glucose, Bld 128 (H) 65 - 99 mg/dL   BUN 11 6 - 20 mg/dL   Creatinine, Ser 0.77 0.44 - 1.00 mg/dL   Calcium 8.3 (L) 8.9 - 10.3 mg/dL   GFR calc non Af Amer >60 >60 mL/min   GFR calc Af Amer >60 >60 mL/min    Comment: (NOTE) The eGFR has been calculated using the CKD EPI equation. This calculation has not been validated in all clinical situations. eGFR's persistently <60 mL/min signify possible Chronic Kidney Disease.    Anion gap 7 5 - 15  CBC with Differential     Status: Abnormal   Collection Time:  05/28/15  6:37 AM  Result Value Ref Range   WBC 8.6 4.0 - 10.5 K/uL   RBC 4.49 3.87 - 5.11 MIL/uL   Hemoglobin 10.3 (L) 12.0 - 15.0 g/dL   HCT 34.0 (L) 36.0 - 46.0 %   MCV 75.7 (L) 78.0 - 100.0 fL   MCH 22.9 (L) 26.0 - 34.0 pg   MCHC 30.3 30.0 - 36.0 g/dL   RDW 19.6 (H) 11.5 - 15.5 %   Platelets 308 150 - 400 K/uL   Neutrophils Relative % 91 %   Neutro Abs 7.8 (H) 1.7 - 7.7 K/uL   Lymphocytes Relative 8 %   Lymphs Abs 0.7 0.7 - 4.0 K/uL   Monocytes Relative 1 %   Monocytes Absolute 0.1 0.1 - 1.0 K/uL   Eosinophils Relative 0 %   Eosinophils Absolute 0.0 0.0 - 0.7 K/uL   Basophils Relative 0 %   Basophils Absolute 0.0 0.0 - 0.1 K/uL  TSH     Status: None   Collection Time: 05/28/15 11:01 AM  Result Value Ref Range   TSH 0.949 0.350 - 4.500 uIU/mL  T4, free     Status: None   Collection Time: 05/28/15 11:01 AM  Result Value Ref Range   Free T4 0.80 0.61 - 1.12 ng/dL  Ferritin     Status: Abnormal   Collection Time: 05/28/15 11:01 AM  Result Value Ref Range   Ferritin 4 (L) 11 - 307 ng/mL  Iron and TIBC     Status: Abnormal   Collection Time: 05/28/15  2:25 PM  Result Value Ref Range   Iron 22 (L) 28 - 170 ug/dL   TIBC 486 (H) 250 - 450 ug/dL   Saturation Ratios 5 (L) 10.4 - 31.8 %   UIBC 464 ug/dL     Lipid Panel  No results found for: CHOL, TRIG, HDL, CHOLHDL, VLDL, LDLCALC, LDLDIRECT   Lab Results  Component Value Date   HGBA1C * 11/25/2009    5.7 (NOTE)  According to the ADA Clinical Practice Recommendations for 2011, when HbA1c is used as a screening test:   >=6.5%   Diagnostic of Diabetes Mellitus           (if abnormal result  is confirmed)  5.7-6.4%   Increased risk of developing Diabetes Mellitus  References:Diagnosis and Classification of Diabetes Mellitus,Diabetes UJWJ,1914,78(GNFAO 1):S62-S69 and Standards of Medical Care in         Diabetes - 2011,Diabetes ZHYQ,6578,46  (Suppl  1):S11-S61.     Lab Results  Component Value Date   CREATININE 0.77 05/28/2015     HPI :65 y.o. female with a history of .trigeminal neuralgia. She underwent gamma knife procedure in 2013 without much improvement in facial pain or headaches. She had a sub occipital craniotomy for microvascular decompression of the left trigeminal motor In 2013. longstanding history of migraine headaches since age 67. She averages about 1-2 severe migraines a month. Generally the pain starts across her forehead then eventually settles in one of her eyes. Patient gets headaches on a regular basis (three times a weeks). She takes Ibuprofen on a daily basis and is usually able to stave of Migraines with Sumatriptan. Migraines frequently associated with nausea and vomiting Patient is followed by neurology. Several head CTscans for headaches / migraines have been negative.   24-36 hours ago patient awoke with a severe headache across her forehead. She subsequently developed nausea, vomiting, and pain settled left eye. Patient tried sumatriptan but pain escalated and she developed nausea and vomiting. Light makes pain worse. No associated visual disturbances.   HOSPITAL COURSE:   Migraine with nausea / vomiting.history of left trigeminal neuralgia. She has had Gamma Knife in May of 2013, and returned for microvascular decompression with Dr. Salomon Fick on July 20, 2012. She has seen Dr. Vivi Ferns for bilateral occipital, posterior auricular, third occipital and lesser occipital nerve blocks on 05.28.14. Has an appt with Dr Salomon Fick on 06/02/15  Symptoms improved after receiving steroids, Toradol, dilaudid, morphine and Depacon in ED. Patient on numerous medications at home, including NSAIDs.  -Head CTscan negative.   MRI /MRA of head shows LEFT suboccipital craniectomy for LEFT trigeminal neuralgia treatment.. -continue home medications.  Verbally consulted  Dr. Doy Mince neurology and they recommend follow-up with  her neurologist and [facility], no changes in her medications at this point  GERD. Continue home PPI  Depression. Continue home anti-depressants.   Hx of hypothyroidism. Not currently on hormone replacement. Normal TSH, Free T4.  Microcytic anemia. No overt GI bleeding. Microcytosis slightly worse than baseline but hgb stable. Patient states she had a colonoscopy without significant findings a couple of years ago. Patient states she had a colonoscopy 2 years ago which was within normal limits. PCP to follow-up and make further recommendations, no evidence of active bleeding in the hospital.  Discharge Exam:   Blood pressure 134/44, pulse 74, temperature 97.7 F (36.5 C), temperature source Oral, resp. rate 19, height '4\' 10"'  (1.473 m), weight 56.246 kg (124 lb), SpO2 100 %.   General: Pleasant white female. Appears calm and comfortable  Eyes: PER, normal lids, irises & conjunctiva  ENT: grossly normal hearing, lips & tongue  Neck: no LAD, no masses  Cardiovascular: RRR, no murmurs. No LE edema.   Respiratory: Respirations even and unlabored. Normal respiratory effort. Lungs CTA bilaterally, no wheezes / rales .   Abdomen: soft, non-distended, non-tender, active bowel sounds. No obvious masses.   Skin: no rash seen on limited exam  Musculoskeletal: grossly  normal tone BUE/BLE  Psychiatric: grossly normal mood and affect, speech fluent and appropriate  Neurologic: grossly non-focal.               Follow-up Information    Schedule an appointment as soon as possible for a visit with GREEN, Keenan Bachelor, MD.   Specialty:  Internal Medicine   Contact information:   [address] [place] [address] [place] [address] [phone]       Signed: Reyne Dumas 05/29/2015, 9:56 AM        Time spent >45 mins

## 2015-05-29 NOTE — Progress Notes (Signed)
Caitlin Irwin to be D/C'd Home per MD order. Discussed with the patient and all questions fully answered.    Medication List    TAKE these medications        ALPRAZolam 1 MG tablet  Commonly known as:  XANAX  Take 0.5-1 mg by mouth 2 (two) times daily as needed for anxiety.     ARIPiprazole 5 MG tablet  Commonly known as:  ABILIFY  Take 1 tablet (5 mg total) by mouth daily.     citalopram 10 MG tablet  Commonly known as:  CELEXA  Take 10 mg by mouth at bedtime.     doxepin 25 MG capsule  Commonly known as:  SINEQUAN  Take 1-3 capsules by mouth at bedtime as needed.     estradiol 1 MG tablet  Commonly known as:  ESTRACE  Take 1 mg by mouth daily.     FIBER SELECT GUMMIES PO  Take 1 tablet by mouth daily.     gabapentin 300 MG capsule  Commonly known as:  NEURONTIN  TAKE 1 CAPSULE (300 MG TOTAL) BY MOUTH 3 (THREE) TIMES DAILY.     levothyroxine 75 MCG tablet  Commonly known as:  SYNTHROID, LEVOTHROID  Take 75 mcg by mouth daily before breakfast.     methadone 5 MG tablet  Commonly known as:  DOLOPHINE  Take 5 mg by mouth daily.     nystatin 100000 UNIT/ML suspension  Commonly known as:  MYCOSTATIN  SWISH AND SPIT WITH 1 TEASPOON 4 TIMES A DAY     ondansetron 4 MG tablet  Commonly known as:  ZOFRAN  Take 1 tablet (4 mg total) by mouth every 6 (six) hours.     oxyCODONE 5 MG immediate release tablet  Commonly known as:  Oxy IR/ROXICODONE  Take 1 tablet (5 mg total) by mouth every 4 (four) hours as needed for moderate pain or severe pain.     pantoprazole 40 MG tablet  Commonly known as:  PROTONIX  Take 40 mg by mouth daily.     pilocarpine 5 MG tablet  Commonly known as:  SALAGEN  TAKE 1/2-1 TABLET BY MOUTH 4 TIMES DAILY AS NEEDED FOR DRY MOUTH     spironolactone 100 MG tablet  Commonly known as:  ALDACTONE  Take 100 mg by mouth daily before breakfast.     SUMAtriptan 100 MG tablet  Commonly known as:  IMITREX  TAKE 1 TABLET MAY REPEAT IN 2 HOURS IF  HEADACHE PERSIST OR RECURES     temazepam 15 MG capsule  Commonly known as:  RESTORIL  Take 45 mg by mouth at bedtime.     topiramate 25 MG tablet  Commonly known as:  TOPAMAX  TAKE 2 TABLETS (50 MG TOTAL) BY MOUTH 2 (TWO) TIMES DAILY.     traMADol 50 MG tablet  Commonly known as:  ULTRAM  Take 50-100 mg by mouth every 8 (eight) hours as needed for moderate pain or severe pain.     traZODone 100 MG tablet  Commonly known as:  DESYREL  Take 50-100 mg by mouth at bedtime.     valACYclovir 500 MG tablet  Commonly known as:  VALTREX  Take 500 mg by mouth daily.        VVS, Skin clean, dry and intact without evidence of skin break down, no evidence of skin tears noted.  An After Visit Summary was printed and given to the patient.  Patient escorted via [place], and D/C home via  taxi.  [name]  05/29/2015 1:51 PM

## 2015-05-31 ENCOUNTER — Other Ambulatory Visit: Payer: Self-pay | Admitting: Neurology

## 2015-06-01 NOTE — Telephone Encounter (Signed)
Rx signed and faxed.

## 2015-06-23 ENCOUNTER — Telehealth: Payer: Self-pay | Admitting: *Deleted

## 2015-07-06 ENCOUNTER — Ambulatory Visit: Payer: PPO | Admitting: Licensed Clinical Social Worker

## 2015-07-15 ENCOUNTER — Ambulatory Visit: Payer: PPO | Admitting: Licensed Clinical Social Worker

## 2015-07-24 ENCOUNTER — Emergency Department (HOSPITAL_COMMUNITY)
Admission: EM | Admit: 2015-07-24 | Discharge: 2015-07-25 | Disposition: A | Discharge status: Home / Self Care | Payer: PPO | Attending: Emergency Medicine | Admitting: Emergency Medicine

## 2015-07-24 ENCOUNTER — Encounter (HOSPITAL_COMMUNITY): Payer: Self-pay | Admitting: Emergency Medicine

## 2015-07-24 ENCOUNTER — Emergency Department (HOSPITAL_COMMUNITY): Payer: PPO

## 2015-07-24 DIAGNOSIS — K219 Gastro-esophageal reflux disease without esophagitis: Secondary | ICD-10-CM | POA: Insufficient documentation

## 2015-07-24 DIAGNOSIS — Z79899 Other long term (current) drug therapy: Secondary | ICD-10-CM | POA: Insufficient documentation

## 2015-07-24 DIAGNOSIS — F329 Major depressive disorder, single episode, unspecified: Secondary | ICD-10-CM | POA: Insufficient documentation

## 2015-07-24 DIAGNOSIS — F419 Anxiety disorder, unspecified: Secondary | ICD-10-CM | POA: Diagnosis not present

## 2015-07-24 DIAGNOSIS — G8929 Other chronic pain: Secondary | ICD-10-CM | POA: Insufficient documentation

## 2015-07-24 DIAGNOSIS — R1084 Generalized abdominal pain: Secondary | ICD-10-CM | POA: Diagnosis not present

## 2015-07-24 DIAGNOSIS — Z862 Personal history of diseases of the blood and blood-forming organs and certain disorders involving the immune mechanism: Secondary | ICD-10-CM | POA: Diagnosis not present

## 2015-07-24 DIAGNOSIS — N189 Chronic kidney disease, unspecified: Secondary | ICD-10-CM | POA: Diagnosis not present

## 2015-07-24 DIAGNOSIS — E039 Hypothyroidism, unspecified: Secondary | ICD-10-CM | POA: Insufficient documentation

## 2015-07-24 DIAGNOSIS — Z8711 Personal history of peptic ulcer disease: Secondary | ICD-10-CM | POA: Diagnosis not present

## 2015-07-24 DIAGNOSIS — R112 Nausea with vomiting, unspecified: Secondary | ICD-10-CM | POA: Diagnosis present

## 2015-07-24 DIAGNOSIS — R109 Unspecified abdominal pain: Secondary | ICD-10-CM

## 2015-07-24 LAB — CBC
HEMATOCRIT: 32.7 % — AB (ref 36.0–46.0)
Hemoglobin: 10.1 g/dL — ABNORMAL LOW (ref 12.0–15.0)
MCH: 23.4 pg — ABNORMAL LOW (ref 26.0–34.0)
MCHC: 30.9 g/dL (ref 30.0–36.0)
MCV: 75.7 fL — ABNORMAL LOW (ref 78.0–100.0)
PLATELETS: 336 10*3/uL (ref 150–400)
RBC: 4.32 MIL/uL (ref 3.87–5.11)
RDW: 18.1 % — AB (ref 11.5–15.5)
WBC: 13.7 10*3/uL — AB (ref 4.0–10.5)

## 2015-07-24 LAB — COMPREHENSIVE METABOLIC PANEL
ALT: 12 U/L — ABNORMAL LOW (ref 14–54)
AST: 22 U/L (ref 15–41)
Albumin: 3.6 g/dL (ref 3.5–5.0)
Alkaline Phosphatase: 51 U/L (ref 38–126)
Anion gap: 9 (ref 5–15)
BILIRUBIN TOTAL: 0.4 mg/dL (ref 0.3–1.2)
BUN: 11 mg/dL (ref 6–20)
CO2: 23 mmol/L (ref 22–32)
CREATININE: 0.97 mg/dL (ref 0.44–1.00)
Calcium: 9.9 mg/dL (ref 8.9–10.3)
Chloride: 108 mmol/L (ref 101–111)
GFR calc Af Amer: >60 mL/min (ref >60)
GFR calc non Af Amer: 60 mL/min — ABNORMAL LOW (ref >60)
Glucose, Bld: 104 mg/dL — ABNORMAL HIGH (ref 65–99)
POTASSIUM: 3.9 mmol/L (ref 3.5–5.1)
Sodium: 140 mmol/L (ref 135–145)
TOTAL PROTEIN: 6.3 g/dL — AB (ref 6.5–8.1)

## 2015-07-24 LAB — LIPASE, BLOOD: Lipase: 22 U/L (ref 11–51)

## 2015-07-24 MED ORDER — SODIUM CHLORIDE 0.9 % IV BOLUS (SEPSIS)
1000.0000 mL | Freq: Once | INTRAVENOUS | Status: AC
  Administered 2015-07-24: 1000 mL via INTRAVENOUS

## 2015-07-24 MED ORDER — ONDANSETRON HCL 4 MG/2ML IJ SOLN
4.0000 mg | Freq: Once | INTRAMUSCULAR | Status: AC
  Administered 2015-07-24: 4 mg via INTRAVENOUS
  Filled 2015-07-24: qty 2

## 2015-07-24 MED ORDER — BARIUM SULFATE 2.1 % PO SUSP
ORAL | Status: AC
  Filled 2015-07-24: qty 2

## 2015-07-24 NOTE — ED Notes (Signed)
Pt states she just to 5 to 6 zips of the ginger ale an started feeling nauseous so she stopped drinking. MD to be notified.

## 2015-07-24 NOTE — ED Notes (Signed)
PO challenge started

## 2015-07-24 NOTE — ED Notes (Signed)
Pt having n/v for 24 hours on EMS arrival pt had some episodes of in and out of A flutter no previous hx of this. Pt is having some SOB denies pain, fevers or chills.

## 2015-07-24 NOTE — ED Notes (Signed)
Patient transported to X-ray 

## 2015-07-24 NOTE — ED Provider Notes (Signed)
CSN: [id number]     Arrival date & time 07/24/15  1950 History   First MD Initiated Contact with Patient 07/24/15 2001     Chief Complaint  Patient presents with  . Nausea  . Emesis  . Atrial Flutter     Patient is a 64 y.o. female presenting with vomiting. The history is provided by the patient. No language interpreter was used.  Emesis  Caitlin Irwin is a 64 y.o. female who presents to the Emergency Department complaining of nausea/vomiting.   She reports nausea and vomiting that started at 4 PM yesterday. She denies any chest pain, diarrhea, dysuria,  fevers. She did have some shortness of breath earlier. She had epigastric that she relates this due to the vomiting and started after her vomiting began. She has a history of total hysterectomy, no other medical problems.  Her vomiting has stopped since 5 PM today. Symptoms are moderate.  Past Medical History  Diagnosis Date  . Other constipation   . Unspecified hypothyroidism   . IBS (irritable bowel syndrome)   . PUD (peptic ulcer disease)   . Chronic pain     scoliosis  . Parotid tumor   . Anemia   . Anxiety   . Blood transfusion   . Cataract   . GERD (gastroesophageal reflux disease)   . Osteoporosis   . H/O hiatal hernia   . Trigeminal neuralgia   . S/P radiation therapy   . PONV (postoperative nausea and vomiting)     hx of " getting too much" - 1998, slow to take up by body   . Chronic kidney disease     hx of uti   . Depression   . Migraines   . High cholesterol   . 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 Hypersomnia   . Parotid tumor   . Ulcer   . Ataxia 03/24/2014  . Accidental ASA poisoning 08/2014   Past Surgical History  Procedure Laterality Date  . Cholecystectomy    . Abdominal hysterectomy    . Right knee surgery    . L5 surgery    . Heary surgery      correct hole in the heart  . Left ankle    . Back surgery    . Left shoulder surgery     . Cut muscles and nerves in head       to stop migraines and it did not work   . Heart outpaitent surgery       to correct hole in heart   . Brain surgery      parotid tumor removed   . Esophagogastroduodenoscopy  06/12/2012    Procedure: ESOPHAGOGASTRODUODENOSCOPY (EGD);  Surgeon: Lear Ng, MD;  Location: [facility];  Service: Endoscopy;  Laterality: N/A;  . Gamma knife for trig neuralgia  12/27/11  . Parotio gland      mass on gland removed, benign  . Mass excision  2012    for degenerative disk disease  . Shoulder surgery Right 11-2014  . Aspirin poisoning  09/2014  . Abdominal hernia repair  05-14-15    Dr. Redmond Pulling to operate   Family History  Problem Relation Age of Onset  . Ovarian cancer Mother   . Migraines Mother   . Heart disease Maternal Grandfather   . Diabetes Father   . Migraines Brother   . Heart disease Other    Social History  Substance Use Topics  . Smoking status:  Never Smoker   . Smokeless tobacco: Never Used  . Alcohol Use: No   OB History    No data available     Review of Systems  Gastrointestinal: Positive for vomiting.  All other systems reviewed and are negative.     Allergies  Acetaminophen  Home Medications   Prior to Admission medications   Medication Sig Start Date End Date Taking? Authorizing Provider  ALPRAZolam (XANAX) 1 MG tablet TAKE 1 TABLET BY MOUTH TWICE A DAY AS NEEDED FOR ANXIETY 06/01/15  Yes Larey Seat, MD  ARIPiprazole (ABILIFY) 5 MG tablet Take 1 tablet (5 mg total) by mouth daily. Patient taking differently: Take 2.5 mg by mouth daily.  08/29/14  Yes Shanker Kristeen Mans, MD  citalopram (CELEXA) 10 MG tablet Take 10 mg by mouth at bedtime.   Yes Historical Provider, MD  doxepin (SINEQUAN) 25 MG capsule Take 25-75 capsules by mouth at bedtime as needed (migraines).  04/30/15  Yes Historical Provider, MD  estradiol (ESTRACE) 1 MG tablet Take 1 mg by mouth daily.  04/21/13  Yes Historical Provider, MD  FIBER SELECT GUMMIES PO Take 1 tablet by mouth  daily.    Yes Historical Provider, MD  gabapentin (NEURONTIN) 300 MG capsule TAKE 1 CAPSULE (300 MG TOTAL) BY MOUTH 3 (THREE) TIMES DAILY. Patient taking differently: Take 300 mg by mouth 2 (two) times daily.  04/15/15  Yes Dennie Bible, NP  levothyroxine (SYNTHROID, LEVOTHROID) 75 MCG tablet Take 75 mcg by mouth daily before breakfast.    Yes Historical Provider, MD  methadone (DOLOPHINE) 5 MG tablet Take 5 mg by mouth daily. 03/22/15  Yes Historical Provider, MD  ondansetron (ZOFRAN) 4 MG tablet Take 1 tablet (4 mg total) by mouth every 6 (six) hours. Patient taking differently: Take 4 mg by mouth every 8 (eight) hours as needed.  06/29/14  Yes Cleatrice Burke, PA-C  pantoprazole (PROTONIX) 40 MG tablet Take 40 mg by mouth daily.    Yes Historical Provider, MD  pilocarpine (SALAGEN) 5 MG tablet TAKE 1/2-1 TABLET BY MOUTH 4 TIMES DAILY AS NEEDED FOR DRY MOUTH 02/17/15  Yes Historical Provider, MD  spironolactone (ALDACTONE) 100 MG tablet Take 100 mg by mouth daily before breakfast.    Yes Historical Provider, MD  SUMAtriptan (IMITREX) 100 MG tablet TAKE 1 TABLET MAY REPEAT IN 2 HOURS IF HEADACHE PERSIST OR RECURES 05/17/15  Yes Carmen Dohmeier, MD  temazepam (RESTORIL) 15 MG capsule Take 45 mg by mouth at bedtime. 03/27/15  Yes Historical Provider, MD  traMADol (ULTRAM) 50 MG tablet Take 50-100 mg by mouth every 8 (eight) hours as needed for moderate pain or severe pain.   Yes Historical Provider, MD  traZODone (DESYREL) 100 MG tablet Take 50-100 mg by mouth at bedtime.   Yes Historical Provider, MD  valACYclovir (VALTREX) 500 MG tablet Take 500 mg by mouth daily.   Yes Historical Provider, MD  oxyCODONE (OXY IR/ROXICODONE) 5 MG immediate release tablet Take 1 tablet (5 mg total) by mouth every 4 (four) hours as needed for moderate pain or severe pain. Patient not taking: Reported on 07/24/2015 0000000   Delora Fuel, MD  topiramate (TOPAMAX) 25 MG tablet TAKE 2 TABLETS (50 MG TOTAL) BY MOUTH 2  (TWO) TIMES DAILY. Patient not taking: Reported on 07/24/2015 04/15/15   Dennie Bible, NP   BP 121/72 mmHg  Pulse 68  Temp(Src) 99.3 F (37.4 C) (Oral)  Resp 11  Ht 4\' 10"  (1.473 m)  Wt 100 lb (  45.36 kg)  BMI 20.91 kg/m2  SpO2 100% Physical Exam  Constitutional: She is oriented to person, place, and time. She appears well-developed and well-nourished.  HENT:  Head: Normocephalic and atraumatic.  Cardiovascular: Normal rate and regular rhythm.   No murmur heard. Pulmonary/Chest: Effort normal and breath sounds normal. No respiratory distress.  Abdominal: Soft. There is no rebound and no guarding.  Mild diffuse abdominal tenderness  Musculoskeletal: She exhibits no edema or tenderness.  Neurological: She is alert and oriented to person, place, and time.  Skin: Skin is warm and dry.  Psychiatric: She has a normal mood and affect. Her behavior is normal.  Nursing note and vitals reviewed.   ED Course  Procedures (including critical care time) Labs Review Labs Reviewed  COMPREHENSIVE METABOLIC PANEL - Abnormal; Notable for the following:    Glucose, Bld 104 (*)    Total Protein 6.3 (*)    ALT 12 (*)    GFR calc non Af Amer 60 (*)    All other components within normal limits  CBC - Abnormal; Notable for the following:    WBC 13.7 (*)    Hemoglobin 10.1 (*)    HCT 32.7 (*)    MCV 75.7 (*)    MCH 23.4 (*)    RDW 18.1 (*)    All other components within normal limits  LIPASE, BLOOD  I-STAT TROPOININ, ED    Imaging Review Dg Chest 2 View  07/24/2015  CLINICAL DATA:  Pt having n/v for 24 hours on EMS arrival pt had some episodes of in and out of A flutter no previous hx of this. Pt is having some SOB denies pain, fevers or chills. EXAM: CHEST - 2 VIEW COMPARISON:  10/12/2013 FINDINGS: ASD occluder device stable.  Heart size normal.  Lungs are clear. No effusion.  No pneumothorax. Mild thoracic dextroscoliosis.  Surgical clips in the upper abdomen. IMPRESSION: 1. No  acute cardiopulmonary disease. 2. Postop changes as before. Electronically Signed   By: Lucrezia Europe M.D.   On: 07/24/2015 20:48   I have personally reviewed and evaluated these images and lab results as part of my medical decision-making.   EKG Interpretation   Date/Time:  Friday July 24 2015 19:53:59 EST Ventricular Rate:  65 PR Interval:  141 QRS Duration: 96 QT Interval:  388 QTC Calculation: 403 R Axis:   -35 Text Interpretation:  Sinus rhythm Left axis deviation Abnormal R-wave  progression, early transition Confirmed by Hazle Coca [phone]) on 07/24/2015  8:01:36 PM      MDM   Final diagnoses:  None    Pt here with nausea/vomiting for last two days.  Presentation not c/w ACS, pancreatitis.  Pt with persistent nausea with attempting PO challenge.  CT pending to rule out obstruction.  Pt care transferred to Dr. Claudine Mouton pending CT results.      Quintella Reichert, MD 07/25/15 [phone]

## 2015-07-25 ENCOUNTER — Emergency Department (HOSPITAL_COMMUNITY): Payer: PPO

## 2015-07-25 ENCOUNTER — Encounter (HOSPITAL_COMMUNITY): Payer: Self-pay | Admitting: Radiology

## 2015-07-25 MED ORDER — PROMETHAZINE HCL 25 MG PO TABS: 25.0000 mg | ORAL_TABLET | Freq: Two times a day (BID) | ORAL | Status: DC | PRN

## 2015-07-25 MED ORDER — IOHEXOL 300 MG/ML  SOLN
80.0000 mL | Freq: Once | INTRAMUSCULAR | Status: AC | PRN
  Administered 2015-07-25: 80 mL via INTRAVENOUS

## 2015-07-25 NOTE — ED Provider Notes (Signed)
CT scan unremarkable and does not show an acute cause for her pain or nausea.  Abdominal eam does not reveal any tenderness.  She appears well and in NAD.  VS remain within her normal limits and she is safe for DC.  Everlene Balls, MD 07/25/15 0157

## 2015-07-25 NOTE — ED Notes (Signed)
Pt has only drank most of first bottle of Baruim; pt refuses to drink anymore states it hurts; Pt was offered nausea meds and refused;Md notified and CT tech notified; Md request pt be scanned with what she has already drank. CT notified.

## 2015-07-25 NOTE — ED Notes (Signed)
Patient transported to CT 

## 2015-07-25 NOTE — Discharge Instructions (Signed)
Abdominal Pain, Adult Caitlin Irwin, your CT scan did not show a new cause for your pain.  See a primary care doctor within 3 days for close follow up. If symptoms worsen, come back to the ED immediately.  Thank you. Many things can cause belly (abdominal) pain. Most times, the belly pain is not dangerous. Many cases of belly pain can be watched and treated at home. HOME CARE   Do not take medicines that help you go poop (laxatives) unless told to by your doctor.  Only take medicine as told by your doctor.  Eat or drink as told by your doctor. Your doctor will tell you if you should be on a special diet. GET HELP IF:  You do not know what is causing your belly pain.  You have belly pain while you are sick to your stomach (nauseous) or have runny poop (diarrhea).  You have pain while you pee or poop.  Your belly pain wakes you up at night.  You have belly pain that gets worse or better when you eat.  You have belly pain that gets worse when you eat fatty foods.  You have a fever. GET HELP RIGHT AWAY IF:   The pain does not go away within 2 hours.  You keep throwing up (vomiting).  The pain changes and is only in the right or left part of the belly.  You have bloody or tarry looking poop. MAKE SURE YOU:   Understand these instructions.  Will watch your condition.  Will get help right away if you are not doing well or get worse.   This information is not intended to replace advice given to you by your health care provider. Make sure you discuss any questions you have with your health care provider.   Document Released: 01/04/2008 Document Revised: 08/08/2014 Document Reviewed: 03/27/2013 Elsevier Interactive Patient Education [employer].

## 2015-08-06 DIAGNOSIS — G43909 Migraine, unspecified, not intractable, without status migrainosus: Secondary | ICD-10-CM | POA: Diagnosis not present

## 2015-08-13 DIAGNOSIS — G43109 Migraine with aura, not intractable, without status migrainosus: Secondary | ICD-10-CM | POA: Diagnosis not present

## 2015-08-13 DIAGNOSIS — Z79891 Long term (current) use of opiate analgesic: Secondary | ICD-10-CM | POA: Diagnosis not present

## 2015-08-13 DIAGNOSIS — G894 Chronic pain syndrome: Secondary | ICD-10-CM | POA: Diagnosis not present

## 2015-08-13 DIAGNOSIS — R51 Headache: Secondary | ICD-10-CM | POA: Diagnosis not present

## 2015-08-22 DIAGNOSIS — M7541 Impingement syndrome of right shoulder: Secondary | ICD-10-CM | POA: Diagnosis not present

## 2015-08-22 DIAGNOSIS — M1711 Unilateral primary osteoarthritis, right knee: Secondary | ICD-10-CM | POA: Diagnosis not present

## 2015-08-27 ENCOUNTER — Telehealth: Payer: Self-pay | Admitting: *Deleted

## 2015-08-27 NOTE — Telephone Encounter (Signed)
Error

## 2015-09-03 ENCOUNTER — Ambulatory Visit: Payer: Self-pay | Admitting: General Surgery

## 2015-09-03 DIAGNOSIS — K429 Umbilical hernia without obstruction or gangrene: Secondary | ICD-10-CM | POA: Diagnosis not present

## 2015-09-03 DIAGNOSIS — K436 Other and unspecified ventral hernia with obstruction, without gangrene: Secondary | ICD-10-CM | POA: Diagnosis not present

## 2015-09-10 NOTE — Pre-Procedure Instructions (Signed)
Caitlin Irwin  09/10/2015      [id number] [address], [place] [facility] [place] [address] Phone: [phone] Fax: [phone]    Your procedure is scheduled on 09/15/2015  Report to [facility] Admitting at 11:30 A.M.  Call this number if you have problems the morning of surgery:  [phone]   Remember:  Do not eat food or drink liquids after midnight.  On  Monday NIGHT               IF TAKING ASPIRIN, FISH OIL, HERBAL SUPPLEMENTS or ANTIINFLAMMATORIES--------STOP    Take these medicines the morning of surgery with A SIP OF WATER xanax if needed,abilify,estrace,synthroid,methadone,protonix,topiramate,valtrex,gabapentin   Do not wear jewelry, make-up or nail polish.   Do not wear lotions, powders, or perfumes.  You may not wear deodorant.   Do not shave 48 hours prior to surgery.     Do not bring valuables to the hospital.   [facility] is not responsible for any belongings or valuables.  Contacts, dentures or bridgework may not be worn into surgery.  Leave your suitcase in the car.  After surgery it may be brought to your room.  For patients admitted to the hospital, discharge time will be determined by your treatment team.  Patients discharged the day of surgery will not be allowed to drive home.     Special instructions:  Special Instructions: [place] - Preparing for Surgery  Before surgery, you can play an important role.  Because skin is not sterile, your skin needs to be as free of germs as possible.  You can reduce the number of germs on you skin by washing with CHG (chlorahexidine gluconate) soap before surgery.  CHG is an antiseptic cleaner which kills germs and bonds with the skin to continue killing germs even after washing.  Please DO NOT use if you have an allergy to CHG or antibacterial soaps.  If your skin becomes reddened/irritated stop using the CHG and inform your nurse when you arrive  at Short Stay.  Do not shave (including legs and underarms) for at least 48 hours prior to the first CHG shower.  You may shave your face.  Please follow these instructions carefully:   1.  Shower with CHG Soap the night before surgery and the  morning of Surgery.  2.  If you choose to wash your hair, wash your hair first as usual with your  normal shampoo.  3.  After you shampoo, rinse your hair and body thoroughly to remove the  Shampoo.  4.  Use CHG as you would any other liquid soap.  You can apply chg directly to the skin and wash gently with scrungie or a clean washcloth.  5.  Apply the CHG Soap to your body ONLY FROM THE NECK DOWN.    Do not use on open wounds or open sores.  Avoid contact with your eyes, ears, mouth and genitals (private parts).  Wash genitals (private parts)   with your normal soap.  6.  Wash thoroughly, paying special attention to the area where your surgery will be performed.  7.  Thoroughly rinse your body with warm water from the neck down.  8.  DO NOT shower/wash with your normal soap after using and rinsing off   the CHG Soap.  9.  Pat yourself dry with a clean towel.            10.  Wear clean pajamas.            11.  Place clean sheets on your bed the night of your first shower and do not sleep with pets.  Day of Surgery  Do not apply any lotions/deodorants the morning of surgery.  Please wear clean clothes to the hospital/surgery center.  Please read over the following fact sheets that you were given. Pain Booklet, Coughing and Deep Breathing and Surgical Site Infection Prevention

## 2015-09-11 ENCOUNTER — Encounter (HOSPITAL_COMMUNITY): Payer: Self-pay

## 2015-09-11 ENCOUNTER — Encounter (HOSPITAL_COMMUNITY)
Admission: RE | Admit: 2015-09-11 | Discharge: 2015-09-11 | Disposition: A | Discharge status: Home / Self Care | Payer: PPO | Source: Ambulatory Visit | Attending: General Surgery | Admitting: General Surgery

## 2015-09-11 DIAGNOSIS — Z01812 Encounter for preprocedural laboratory examination: Secondary | ICD-10-CM | POA: Insufficient documentation

## 2015-09-11 DIAGNOSIS — K439 Ventral hernia without obstruction or gangrene: Secondary | ICD-10-CM | POA: Diagnosis not present

## 2015-09-11 LAB — CBC
HEMATOCRIT: 33.8 % — AB (ref 36.0–46.0)
HEMOGLOBIN: 10.7 g/dL — AB (ref 12.0–15.0)
MCH: 25.2 pg — AB (ref 26.0–34.0)
MCHC: 31.7 g/dL (ref 30.0–36.0)
MCV: 79.5 fL (ref 78.0–100.0)
Platelets: 243 10*3/uL (ref 150–400)
RBC: 4.25 MIL/uL (ref 3.87–5.11)
RDW: 18.3 % — AB (ref 11.5–15.5)
WBC: 5.3 10*3/uL (ref 4.0–10.5)

## 2015-09-11 LAB — BASIC METABOLIC PANEL
Anion gap: 8 (ref 5–15)
BUN: 9 mg/dL (ref 6–20)
CALCIUM: 9.5 mg/dL (ref 8.9–10.3)
CO2: 24 mmol/L (ref 22–32)
Chloride: 107 mmol/L (ref 101–111)
Creatinine, Ser: 0.99 mg/dL (ref 0.44–1.00)
GFR calc Af Amer: >60 mL/min (ref >60)
GFR, EST NON AFRICAN AMERICAN: 58 mL/min — AB (ref >60)
GLUCOSE: 89 mg/dL (ref 65–99)
POTASSIUM: 3.8 mmol/L (ref 3.5–5.1)
Sodium: 139 mmol/L (ref 135–145)

## 2015-09-14 MED ORDER — CHLORHEXIDINE GLUCONATE 4 % EX LIQD: 1.0000 "application " | Freq: Once | CUTANEOUS | Status: DC

## 2015-09-14 MED ORDER — CEFAZOLIN SODIUM-DEXTROSE 2-3 GM-% IV SOLR
2.0000 g | INTRAVENOUS | Status: AC
  Administered 2015-09-15: 2 g via INTRAVENOUS
  Filled 2015-09-14: qty 50

## 2015-09-14 NOTE — H&P (Signed)
Caitlin Irwin 09/03/2015 2:45 PM Location: [facility] Patient #: [id number] DOB: 01-May-1951 Widowed / Language: Caitlin Irwin / Race: White Female   History of Present Illness Caitlin Irwin; 09/04/2015 11:12 Irwin) Patient words: abd buldge.  The patient is a 65 year old female who presents with an umbilical hernia. She comes in for long-term follow-up regarding her epigastric hernia. I initially met her last summer. At that time it wasn't terribly symptomatic. She also states that she can proceed with surgery last summer due to finances. She states her hernia has been bothering her more and more. She believes it has slightly increased in size. She states the area is more tender and sore to touch. She denies any medical changes since last summer. She denies any trips to the emergency room or hospital. She denies any chest pain or chest pressure. She is had several episodes that are intermittent in nature where she will have multiple episodes of vomiting over the course of several days. She does not have any abdominal pain when this occurs. There is no particular pattern to it. She denies any difficulty swallowing liquids or eating liquids. She denies any pain with eating. She denies any hematemesis.  July 2016: She is referred by Dr Michail Sermon for evaluation of epigastric hernia. She states for several months now she has had tenderness in her epigastrium area. Then about 2 weeks later she developed bloating, constipation, and weight gain. She went to see her doctor who did blood work which she said was normal. Because the discomfort and bulge in her upper abdomen were persistent she underwent a CT scan which demonstrated a small epigastric hernia containing fat as well as a very small periumbilical hernia. She also had a large stool burden. Then a small liver hemangioma. She has had a prior laparoscopic cholecystectomy. She states that she went through a period of severe  depression last year when her husband died unexpectedly. However her depression is significantly improved. She denies any chest pain, chest pressure, shortness breath, dyspnea on exertion, she does have problems with migraines and some balance issues. She denies any TIAs or amaurosis fugax.   Problem List/Past Medical Caitlin Hiss Ronnie Derby, Irwin; 09/04/2015 11:12 Irwin) Caitlin Irwin EPIGASTRIC HERNIA (B15.1) UMBILICAL HERNIA WITHOUT OBSTRUCTION AND WITHOUT GANGRENE (K42.9)  Other Problems Caitlin Irwin) Other disease, cancer, significant illness Thyroid Disease Gastric Ulcer Migraine Headache Oophorectomy Bilateral.  Past Surgical History Caitlin Irwin) Hysterectomy (due to cancer) - Complete Hysterectomy (not due to cancer) - Complete Knee Surgery Right. Gallbladder Surgery - Open Spinal Surgery - Lower Back  Diagnostic Studies History Caitlin Irwin) Mammogram 1-3 years ago Pap Smear 1-5 years ago  Allergies (Caitlin Eversole, LPN; 02/03/1606 3:71 PM) Acetaminophen *CHEMICALS*  Medication History (Caitlin Eversole, LPN; 0/12/2692 8:54 PM) ARIPiprazole (5MG Tablet, Oral) Active. Estradiol (1MG Tablet, Oral) Active. Gabapentin (300MG Capsule, Oral) Active. Nystatin (100000 UNIT/ML Suspension, Mouth/Throat) Active. Pantoprazole Sodium (40MG Tablet DR, Oral) Active. SUMAtriptan Succinate (100MG Tablet, Oral) Active. Topiramate (25MG Tablet, Oral) Active. ValACYclovir HCl (500MG Tablet, Oral) Active. Levothyroxine Sodium (75MCG Tablet, Oral) Active. ALPRAZolam (1MG Tablet, Oral) Active. Citalopram Hydrobromide (10MG Tablet, Oral) Active. Medications Reconciled  Social History Caitlin Irwin) No alcohol use No drug use Tobacco use Never smoker. Caffeine use Carbonated beverages.  Family History Caitlin Irwin) Cervical Cancer Mother. Migraine  Headache Mother. Ovarian Cancer Mother.  Pregnancy /  Birth History Caitlin Irwin) Age at menarche 25 years. Age of menopause <43 Gravida 0 Para 0    Review of Systems Caitlin Irwin; 09/04/2015 11:08 Irwin) General Present- Fatigue. Not Present- Appetite Loss, Chills, Fever, Night Sweats, Weight Gain and Weight Loss. Skin Not Present- Change in Wart/Mole, Dryness, Hives, Jaundice, New Lesions, Non-Healing Wounds, Rash and Ulcer. HEENT Present- Earache and Oral Ulcers. Not Present- Hearing Loss, Hoarseness, Nose Bleed, Ringing in the Ears, Seasonal Allergies, Sinus Pain, Sore Throat, Visual Disturbances, Wears glasses/contact lenses and Yellow Eyes. Respiratory Not Present- Bloody sputum, Chronic Cough, Difficulty Breathing, Snoring and Wheezing. Breast Not Present- Breast Mass, Breast Pain, Nipple Discharge and Skin Changes. Cardiovascular Not Present- Chest Pain, Difficulty Breathing Lying Down, Leg Cramps, Palpitations, Rapid Heart Rate, Shortness of Breath and Swelling of Extremities. Gastrointestinal Present- Bloating, Change in Bowel Habits and Constipation. Not Present- Abdominal Pain, Bloody Stool, Chronic diarrhea, Difficulty Swallowing, Excessive gas, Gets full quickly at meals, Hemorrhoids, Indigestion, Nausea, Rectal Pain and Vomiting. Female Genitourinary Not Present- Frequency, Nocturia, Painful Urination, Pelvic Pain and Urgency. Musculoskeletal Not Present- Back Pain, Joint Pain, Joint Stiffness, Muscle Pain, Muscle Weakness and Swelling of Extremities. Neurological Present- Headaches. Not Present- Decreased Memory, Fainting, Numbness, Seizures, Tingling, Tremor, Trouble walking and Weakness. Psychiatric Present- Change in Sleep Pattern. Not Present- Anxiety, Bipolar, Depression, Fearful and Frequent crying.  Vitals (Caitlin Eversole LPN; 04/08/3381 5:05 PM) 09/03/2015 2:30 PM Weight: 116.4 lb Height: 58.5in Body Surface Area: 1.46 m Body Mass  Index: 23.91 kg/m  Temp.: 84F(Oral)  Pulse: 68 (Regular)  BP: 120/68 (Sitting, Left Arm, Standard)       Physical Exam Caitlin Irwin; 09/04/2015 11:08 Irwin) General Mental Status-Alert. General Appearance-Consistent with stated age. Hydration-Well hydrated. Voice-Normal.  Head and Neck Head-normocephalic, atraumatic with no lesions or palpable masses. Trachea-midline. Thyroid Gland Characteristics - normal size and consistency.  Eye Eyeball - Bilateral-Extraocular movements intact. Sclera/Conjunctiva - Bilateral-No scleral icterus.  Chest and Lung Exam Chest and lung exam reveals -quiet, even and easy respiratory effort with no use of accessory muscles and on auscultation, normal breath sounds, no adventitious sounds and normal vocal resonance. Inspection Chest Wall - Normal. Back - normal.  Breast - Did not examine.  Cardiovascular Cardiovascular examination reveals -normal heart sounds, regular rate and rhythm with no murmurs and normal pedal pulses bilaterally.  Abdomen Inspection  Inspection of the abdomen reveals: Note: visible bulge/lump in epigastric area. soft, tender to deep palpation. partially reducible. defect about 1.5cm. small upper midline diastasis. no significant umbilical fascial defect. Skin - Scar - Note: old trocar scars. Palpation/Percussion Palpation and Percussion of the abdomen reveal - Soft, Non Tender, No Rebound tenderness, No Rigidity (guarding) and No hepatosplenomegaly. Auscultation Auscultation of the abdomen reveals - Bowel sounds normal.  Peripheral Vascular Upper Extremity Palpation - Pulses bilaterally normal.  Neurologic Neurologic evaluation reveals -alert and oriented x 3 with no impairment of recent or remote memory. Mental Status-Normal.  Neuropsychiatric The patient's mood and affect are described as -normal. Judgment and Insight-insight is appropriate concerning matters relevant to  self.  Musculoskeletal Normal Exam - Left-Upper Extremity Strength Normal and Lower Extremity Strength Normal. Normal Exam - Right-Upper Extremity Strength Normal and Lower Extremity Strength Normal.  Lymphatic Head & Neck  General Head & Neck Lymphatics: Bilateral - Description - Normal. Axillary - Did not examine. Femoral & Inguinal - Did not examine.    Assessment & Plan Caitlin Hiss M. Dequandre Cordova Irwin; 09/04/2015 11:12 Irwin) Caitlin Irwin EPIGASTRIC  HERNIA (K43.6) Impression: She still has a small epigastric hernia that I really can't reduce that well. However her hernia only contained adipose tissue on her CT scan last summer and I really don't think it contains anything other than adipose tissue based on my physical exam. I do not believe her intermittent episodes of vomiting is due to her hernia. I encouraged her to discuss that with her primary care doctor or her gastroenterologist. There is also evidence of a tiny umbilical hernia but there is really no clinically significant hernia on exam. My recommendation is still just to repair the epigastric hernia. The recommendation was to do a laparoscopic assisted repair of her epigastric hernia with mesh. I recommended reapproximating her abdominal wall muscles and using mesh underlay. She was given Neurosurgeon & I drew diagrams. Discussed the risks and benefits of surgery. I did advise her that I cannot guarantee that repair of her epigastric hernia would resolve her nausea/vomiting. All of her questions were asked and answered. Current Plans You are being scheduled for surgery - Our schedulers will call you.  You should hear from our office's scheduling department within 5 working days about the location, date, and time of surgery. We try to make accommodations for patient's preferences in scheduling surgery, but sometimes the OR schedule or the surgeon's schedule prevents [place] from making those accommodations.  If you have not heard from our  office [phone]) in 5 working days, call the office and ask for your surgeon's nurse.  If you have other questions about your diagnosis, plan, or surgery, call the office and ask for your surgeon's nurse.  Pt Education - Pamphlet Given - Hernia Surgery: discussed with patient and provided information. UMBILICAL HERNIA WITHOUT OBSTRUCTION AND WITHOUT GANGRENE (K42.9) Impression: going to just observe this for now since asymptomatic and very small  Leighton Ruff. Redmond Pulling, Irwin, FACS General, Bariatric, & Minimally Invasive Surgery [facility], [place]

## 2015-09-15 ENCOUNTER — Ambulatory Visit (HOSPITAL_COMMUNITY): Payer: PPO | Admitting: Critical Care Medicine

## 2015-09-15 ENCOUNTER — Encounter (HOSPITAL_COMMUNITY)
Admission: RE | Disposition: A | Discharge status: Home / Self Care | Payer: Self-pay | Source: Ambulatory Visit | Attending: General Surgery

## 2015-09-15 ENCOUNTER — Observation Stay (HOSPITAL_COMMUNITY)
Admission: RE | Admit: 2015-09-15 | Discharge: 2015-09-16 | Disposition: A | Discharge status: Home / Self Care | Payer: PPO | Source: Ambulatory Visit | Attending: General Surgery | Admitting: General Surgery

## 2015-09-15 ENCOUNTER — Encounter (HOSPITAL_COMMUNITY): Payer: Self-pay | Admitting: Certified Registered Nurse Anesthetist

## 2015-09-15 DIAGNOSIS — E039 Hypothyroidism, unspecified: Secondary | ICD-10-CM | POA: Insufficient documentation

## 2015-09-15 DIAGNOSIS — K436 Other and unspecified ventral hernia with obstruction, without gangrene: Secondary | ICD-10-CM | POA: Diagnosis not present

## 2015-09-15 DIAGNOSIS — K439 Ventral hernia without obstruction or gangrene: Principal | ICD-10-CM | POA: Diagnosis present

## 2015-09-15 DIAGNOSIS — M1990 Unspecified osteoarthritis, unspecified site: Secondary | ICD-10-CM | POA: Diagnosis not present

## 2015-09-15 DIAGNOSIS — M545 Low back pain: Secondary | ICD-10-CM | POA: Diagnosis not present

## 2015-09-15 DIAGNOSIS — D649 Anemia, unspecified: Secondary | ICD-10-CM | POA: Insufficient documentation

## 2015-09-15 HISTORY — PX: EPIGASTRIC HERNIA REPAIR: SHX404

## 2015-09-15 HISTORY — DX: Low back pain, unspecified: M54.50

## 2015-09-15 HISTORY — DX: Low back pain: M54.5

## 2015-09-15 HISTORY — DX: Unspecified osteoarthritis, unspecified site: M19.90

## 2015-09-15 HISTORY — DX: Poisoning by aspirin, accidental (unintentional), initial encounter: T39.011A

## 2015-09-15 HISTORY — PX: LAPAROSCOPIC EPIGASTRIC HERNIA REPAIR: SUR1178

## 2015-09-15 HISTORY — DX: Other chronic pain: G89.29

## 2015-09-15 HISTORY — DX: Personal history of other medical treatment: Z92.89

## 2015-09-15 SURGERY — REPAIR, HERNIA, EPIGASTRIC, ADULT
Anesthesia: General | Site: Abdomen

## 2015-09-15 MED ORDER — LACTATED RINGERS IV SOLN
INTRAVENOUS | Status: DC
  Administered 2015-09-15 (×2): via INTRAVENOUS

## 2015-09-15 MED ORDER — KETOROLAC TROMETHAMINE 30 MG/ML IJ SOLN
INTRAMUSCULAR | Status: DC | PRN
  Administered 2015-09-15: 30 mg via INTRAVENOUS

## 2015-09-15 MED ORDER — NEOSTIGMINE METHYLSULFATE 10 MG/10ML IV SOLN
INTRAVENOUS | Status: AC
  Filled 2015-09-15: qty 1

## 2015-09-15 MED ORDER — SODIUM CHLORIDE 0.9 % IV SOLN: INTRAVENOUS | Status: DC

## 2015-09-15 MED ORDER — PHENYLEPHRINE HCL 10 MG/ML IJ SOLN
INTRAMUSCULAR | Status: DC | PRN
  Administered 2015-09-15 (×2): 40 ug via INTRAVENOUS

## 2015-09-15 MED ORDER — MIDAZOLAM HCL 2 MG/2ML IJ SOLN
INTRAMUSCULAR | Status: AC
  Filled 2015-09-15: qty 2

## 2015-09-15 MED ORDER — LIDOCAINE HCL (CARDIAC) 20 MG/ML IV SOLN
INTRAVENOUS | Status: DC | PRN
  Administered 2015-09-15: 60 mg via INTRAVENOUS

## 2015-09-15 MED ORDER — ONDANSETRON HCL 4 MG/2ML IJ SOLN: 4.0000 mg | Freq: Once | INTRAMUSCULAR | Status: DC | PRN

## 2015-09-15 MED ORDER — OXYCODONE HCL 5 MG/5ML PO SOLN: 5.0000 mg | Freq: Once | ORAL | Status: DC | PRN

## 2015-09-15 MED ORDER — OXYCODONE HCL 5 MG PO TABS
5.0000 mg | ORAL_TABLET | ORAL | Status: DC | PRN
  Administered 2015-09-15 (×2): 10 mg via ORAL
  Filled 2015-09-15: qty 2

## 2015-09-15 MED ORDER — GLYCOPYRROLATE 0.2 MG/ML IJ SOLN
INTRAMUSCULAR | Status: AC
  Filled 2015-09-15: qty 3

## 2015-09-15 MED ORDER — VALACYCLOVIR HCL 500 MG PO TABS
500.0000 mg | ORAL_TABLET | Freq: Every day | ORAL | Status: DC
  Administered 2015-09-16: 500 mg via ORAL
  Filled 2015-09-15: qty 1

## 2015-09-15 MED ORDER — TOPIRAMATE 25 MG PO TABS
50.0000 mg | ORAL_TABLET | Freq: Two times a day (BID) | ORAL | Status: DC
  Administered 2015-09-15 – 2015-09-16 (×2): 50 mg via ORAL
  Filled 2015-09-15 (×3): qty 2

## 2015-09-15 MED ORDER — METHADONE HCL 10 MG PO TABS
5.0000 mg | ORAL_TABLET | Freq: Every day | ORAL | Status: DC
  Administered 2015-09-16: 5 mg via ORAL
  Filled 2015-09-15: qty 1

## 2015-09-15 MED ORDER — OXYCODONE HCL 5 MG PO TABS: 5.0000 mg | ORAL_TABLET | Freq: Four times a day (QID) | ORAL | Status: DC | PRN

## 2015-09-15 MED ORDER — SODIUM CHLORIDE 0.9 % IV SOLN: 250.0000 mL | INTRAVENOUS | Status: DC | PRN

## 2015-09-15 MED ORDER — PANTOPRAZOLE SODIUM 40 MG PO TBEC
40.0000 mg | DELAYED_RELEASE_TABLET | Freq: Every day | ORAL | Status: DC
  Administered 2015-09-16: 40 mg via ORAL
  Filled 2015-09-15: qty 1

## 2015-09-15 MED ORDER — KETOROLAC TROMETHAMINE 15 MG/ML IJ SOLN
15.0000 mg | Freq: Four times a day (QID) | INTRAMUSCULAR | Status: DC
  Administered 2015-09-15 – 2015-09-16 (×3): 15 mg via INTRAVENOUS
  Filled 2015-09-15 (×2): qty 1

## 2015-09-15 MED ORDER — ONDANSETRON HCL 4 MG/2ML IJ SOLN
INTRAMUSCULAR | Status: DC | PRN
  Administered 2015-09-15: 4 mg via INTRAVENOUS

## 2015-09-15 MED ORDER — FENTANYL CITRATE (PF) 100 MCG/2ML IJ SOLN
INTRAMUSCULAR | Status: AC
  Filled 2015-09-15: qty 2

## 2015-09-15 MED ORDER — ROCURONIUM BROMIDE 100 MG/10ML IV SOLN
INTRAVENOUS | Status: DC | PRN
  Administered 2015-09-15: 40 mg via INTRAVENOUS

## 2015-09-15 MED ORDER — SUMATRIPTAN SUCCINATE 50 MG PO TABS
50.0000 mg | ORAL_TABLET | ORAL | Status: DC | PRN
  Filled 2015-09-15: qty 1

## 2015-09-15 MED ORDER — SUGAMMADEX SODIUM 200 MG/2ML IV SOLN
INTRAVENOUS | Status: DC | PRN
  Administered 2015-09-15: 205.2 mg via INTRAVENOUS

## 2015-09-15 MED ORDER — PILOCARPINE HCL 5 MG PO TABS
5.0000 mg | ORAL_TABLET | Freq: Two times a day (BID) | ORAL | Status: DC | PRN
  Filled 2015-09-15: qty 1

## 2015-09-15 MED ORDER — FENTANYL CITRATE (PF) 100 MCG/2ML IJ SOLN
25.0000 ug | INTRAMUSCULAR | Status: DC | PRN
  Administered 2015-09-15: 50 ug via INTRAVENOUS

## 2015-09-15 MED ORDER — 0.9 % SODIUM CHLORIDE (POUR BTL) OPTIME
TOPICAL | Status: DC | PRN
  Administered 2015-09-15: 1000 mL

## 2015-09-15 MED ORDER — SODIUM CHLORIDE 0.9% FLUSH: 3.0000 mL | INTRAVENOUS | Status: DC | PRN

## 2015-09-15 MED ORDER — SUGAMMADEX SODIUM 200 MG/2ML IV SOLN
INTRAVENOUS | Status: AC
  Filled 2015-09-15: qty 2

## 2015-09-15 MED ORDER — GABAPENTIN 300 MG PO CAPS
300.0000 mg | ORAL_CAPSULE | Freq: Two times a day (BID) | ORAL | Status: DC
  Administered 2015-09-15 – 2015-09-16 (×2): 300 mg via ORAL
  Filled 2015-09-15 (×2): qty 1

## 2015-09-15 MED ORDER — DEXAMETHASONE SODIUM PHOSPHATE 4 MG/ML IJ SOLN
INTRAMUSCULAR | Status: DC | PRN
  Administered 2015-09-15: 4 mg via INTRAVENOUS

## 2015-09-15 MED ORDER — SPIRONOLACTONE 100 MG PO TABS
100.0000 mg | ORAL_TABLET | Freq: Every day | ORAL | Status: DC
  Administered 2015-09-16: 100 mg via ORAL
  Filled 2015-09-15: qty 1

## 2015-09-15 MED ORDER — ARTIFICIAL TEARS OP OINT
TOPICAL_OINTMENT | OPHTHALMIC | Status: DC | PRN
  Administered 2015-09-15: 1 via OPHTHALMIC

## 2015-09-15 MED ORDER — OXYCODONE HCL 5 MG PO TABS: 5.0000 mg | ORAL_TABLET | Freq: Once | ORAL | Status: DC | PRN

## 2015-09-15 MED ORDER — SIMETHICONE 80 MG PO CHEW: 40.0000 mg | CHEWABLE_TABLET | Freq: Four times a day (QID) | ORAL | Status: DC | PRN

## 2015-09-15 MED ORDER — DOCUSATE SODIUM 100 MG PO CAPS
100.0000 mg | ORAL_CAPSULE | Freq: Two times a day (BID) | ORAL | Status: DC
  Administered 2015-09-15 – 2015-09-16 (×2): 100 mg via ORAL
  Filled 2015-09-15 (×2): qty 1

## 2015-09-15 MED ORDER — ONDANSETRON HCL 4 MG PO TABS: 4.0000 mg | ORAL_TABLET | Freq: Three times a day (TID) | ORAL | Status: DC | PRN

## 2015-09-15 MED ORDER — BUPIVACAINE LIPOSOME 1.3 % IJ SUSP
20.0000 mL | INTRAMUSCULAR | Status: AC
  Administered 2015-09-15: 20 mL
  Filled 2015-09-15: qty 20

## 2015-09-15 MED ORDER — SODIUM CHLORIDE 0.9 % IJ SOLN
INTRAMUSCULAR | Status: DC | PRN
  Administered 2015-09-15: 20 mL via INTRAVENOUS

## 2015-09-15 MED ORDER — BUPIVACAINE-EPINEPHRINE (PF) 0.25% -1:200000 IJ SOLN
INTRAMUSCULAR | Status: AC
  Filled 2015-09-15: qty 30

## 2015-09-15 MED ORDER — ENSURE ENLIVE PO LIQD: 237.0000 mL | Freq: Two times a day (BID) | ORAL | Status: DC

## 2015-09-15 MED ORDER — TRAZODONE HCL 50 MG PO TABS
50.0000 mg | ORAL_TABLET | Freq: Every day | ORAL | Status: DC
  Administered 2015-09-15: 100 mg via ORAL
  Filled 2015-09-15 (×2): qty 1

## 2015-09-15 MED ORDER — SODIUM CHLORIDE 0.9% FLUSH: 3.0000 mL | Freq: Two times a day (BID) | INTRAVENOUS | Status: DC

## 2015-09-15 MED ORDER — DIPHENHYDRAMINE HCL 50 MG/ML IJ SOLN
INTRAMUSCULAR | Status: DC | PRN
  Administered 2015-09-15: 6.25 mg via INTRAVENOUS

## 2015-09-15 MED ORDER — LEVOTHYROXINE SODIUM 75 MCG PO TABS
75.0000 ug | ORAL_TABLET | Freq: Every day | ORAL | Status: DC
  Administered 2015-09-16: 75 ug via ORAL
  Filled 2015-09-15: qty 1

## 2015-09-15 MED ORDER — FENTANYL CITRATE (PF) 100 MCG/2ML IJ SOLN: 25.0000 ug | INTRAMUSCULAR | Status: DC | PRN

## 2015-09-15 MED ORDER — ALPRAZOLAM 0.5 MG PO TABS
0.5000 mg | ORAL_TABLET | Freq: Two times a day (BID) | ORAL | Status: DC | PRN
  Administered 2015-09-15: 0.5 mg via ORAL
  Filled 2015-09-15: qty 1

## 2015-09-15 MED ORDER — FENTANYL CITRATE (PF) 250 MCG/5ML IJ SOLN
INTRAMUSCULAR | Status: AC
  Filled 2015-09-15: qty 5

## 2015-09-15 MED ORDER — CITALOPRAM HYDROBROMIDE 20 MG PO TABS
10.0000 mg | ORAL_TABLET | Freq: Every day | ORAL | Status: DC
  Administered 2015-09-15: 10 mg via ORAL
  Filled 2015-09-15: qty 1

## 2015-09-15 MED ORDER — HEPARIN SODIUM (PORCINE) 5000 UNIT/ML IJ SOLN
5000.0000 [IU] | Freq: Three times a day (TID) | INTRAMUSCULAR | Status: DC
  Administered 2015-09-16: 5000 [IU] via SUBCUTANEOUS
  Filled 2015-09-15: qty 1

## 2015-09-15 MED ORDER — ARIPIPRAZOLE 5 MG PO TABS
2.5000 mg | ORAL_TABLET | Freq: Every day | ORAL | Status: DC
  Administered 2015-09-16: 2.5 mg via ORAL
  Filled 2015-09-15: qty 1

## 2015-09-15 MED ORDER — FENTANYL CITRATE (PF) 100 MCG/2ML IJ SOLN
INTRAMUSCULAR | Status: DC | PRN
  Administered 2015-09-15: 100 ug via INTRAVENOUS
  Administered 2015-09-15: 50 ug via INTRAVENOUS

## 2015-09-15 MED ORDER — PROPOFOL 10 MG/ML IV BOLUS
INTRAVENOUS | Status: DC | PRN
  Administered 2015-09-15: 20 mg via INTRAVENOUS
  Administered 2015-09-15: 130 mg via INTRAVENOUS

## 2015-09-15 MED ORDER — SCOPOLAMINE 1 MG/3DAYS TD PT72
MEDICATED_PATCH | TRANSDERMAL | Status: DC | PRN
  Administered 2015-09-15: 1 via TRANSDERMAL

## 2015-09-15 MED ORDER — MIDAZOLAM HCL 5 MG/5ML IJ SOLN
INTRAMUSCULAR | Status: DC | PRN
  Administered 2015-09-15 (×2): 1 mg via INTRAVENOUS

## 2015-09-15 MED ORDER — OXYCODONE HCL 5 MG PO TABS
ORAL_TABLET | ORAL | Status: AC
  Filled 2015-09-15: qty 2

## 2015-09-15 SURGICAL SUPPLY — 60 items
APL SKNCLS STERI-STRIP NONHPOA (GAUZE/BANDAGES/DRESSINGS) ×2
APPLIER CLIP 5 13 M/L LIGAMAX5 (MISCELLANEOUS)
APPLIER CLIP ROT 10 11.4 M/L (STAPLE)
APR CLP MED LRG 11.4X10 (STAPLE)
APR CLP MED LRG 5 ANG JAW (MISCELLANEOUS)
BENZOIN TINCTURE PRP APPL 2/3 (GAUZE/BANDAGES/DRESSINGS) ×3 IMPLANT
BINDER ABD UNIV 12 45-62 (WOUND CARE) IMPLANT
BINDER ABDOMINAL 46IN 62IN (WOUND CARE)
BLADE SURG ROTATE 9660 (MISCELLANEOUS) IMPLANT
CANISTER SUCTION 2500CC (MISCELLANEOUS) IMPLANT
CHLORAPREP W/TINT 26ML (MISCELLANEOUS) ×4 IMPLANT
CLIP APPLIE 5 13 M/L LIGAMAX5 (MISCELLANEOUS) IMPLANT
CLIP APPLIE ROT 10 11.4 M/L (STAPLE) IMPLANT
CLOSURE WOUND 1/2 X4 (GAUZE/BANDAGES/DRESSINGS) ×1
COVER SURGICAL LIGHT HANDLE (MISCELLANEOUS) ×4 IMPLANT
DEVICE SECURE STRAP 25 ABSORB (INSTRUMENTS) ×1 IMPLANT
DEVICE TROCAR PUNCTURE CLOSURE (ENDOMECHANICALS) ×1 IMPLANT
DRAPE LAPAROSCOPIC ABDOMINAL (DRAPES) ×3 IMPLANT
DRSG TEGADERM 4X4.75 (GAUZE/BANDAGES/DRESSINGS) ×3 IMPLANT
ELECT REM PT RETURN 9FT ADLT (ELECTROSURGICAL) ×4
ELECTRODE REM PT RTRN 9FT ADLT (ELECTROSURGICAL) ×2 IMPLANT
GAUZE SPONGE 2X2 8PLY STRL LF (GAUZE/BANDAGES/DRESSINGS) ×1 IMPLANT
GLOVE BIO SURGEON STRL SZ7 (GLOVE) ×3 IMPLANT
GLOVE BIO SURGEON STRL SZ7.5 (GLOVE) ×3 IMPLANT
GLOVE BIO SURGEON STRL SZ8 (GLOVE) ×3 IMPLANT
GLOVE BIOGEL M STRL SZ7.5 (GLOVE) ×7 IMPLANT
GLOVE BIOGEL PI IND STRL 6.5 (GLOVE) ×1 IMPLANT
GLOVE BIOGEL PI IND STRL 7.0 (GLOVE) ×1 IMPLANT
GLOVE BIOGEL PI IND STRL 7.5 (GLOVE) ×1 IMPLANT
GLOVE BIOGEL PI INDICATOR 6.5 (GLOVE) ×2
GLOVE BIOGEL PI INDICATOR 7.0 (GLOVE) ×2
GLOVE BIOGEL PI INDICATOR 7.5 (GLOVE) ×2
GOWN STRL REUS W/ TWL LRG LVL3 (GOWN DISPOSABLE) ×6 IMPLANT
GOWN STRL REUS W/ TWL XL LVL3 (GOWN DISPOSABLE) ×2 IMPLANT
GOWN STRL REUS W/TWL LRG LVL3 (GOWN DISPOSABLE) ×12
GOWN STRL REUS W/TWL XL LVL3 (GOWN DISPOSABLE) ×4
KIT BASIN OR (CUSTOM PROCEDURE TRAY) ×4 IMPLANT
KIT ROOM TURNOVER OR (KITS) ×4 IMPLANT
LIQUID BAND (GAUZE/BANDAGES/DRESSINGS) IMPLANT
MARKER SKIN DUAL TIP RULER LAB (MISCELLANEOUS) ×4 IMPLANT
NDL SPNL 22GX3.5 QUINCKE BK (NEEDLE) ×1 IMPLANT
NEEDLE SPNL 22GX3.5 QUINCKE BK (NEEDLE) ×4 IMPLANT
NS IRRIG 1000ML POUR BTL (IV SOLUTION) ×4 IMPLANT
PAD ARMBOARD 7.5X6 YLW CONV (MISCELLANEOUS) ×8 IMPLANT
SCISSORS LAP 5X35 DISP (ENDOMECHANICALS) IMPLANT
SET IRRIG TUBING LAPAROSCOPIC (IRRIGATION / IRRIGATOR) IMPLANT
SLEEVE ENDOPATH XCEL 5M (ENDOMECHANICALS) IMPLANT
SPONGE GAUZE 2X2 STER 10/PKG (GAUZE/BANDAGES/DRESSINGS) ×2
STRIP CLOSURE SKIN 1/2X4 (GAUZE/BANDAGES/DRESSINGS) ×2 IMPLANT
SUT MNCRL AB 4-0 PS2 18 (SUTURE) ×4 IMPLANT
SUT NOVA NAB DX-16 0-1 5-0 T12 (SUTURE) ×4 IMPLANT
SUT VIC AB 3-0 SH 18 (SUTURE) ×3 IMPLANT
TOWEL OR 17X24 6PK STRL BLUE (TOWEL DISPOSABLE) ×8 IMPLANT
TOWEL OR 17X26 10 PK STRL BLUE (TOWEL DISPOSABLE) ×4 IMPLANT
TRAY FOLEY CATH 16FR SILVER (SET/KITS/TRAYS/PACK) IMPLANT
TRAY LAPAROSCOPIC MC (CUSTOM PROCEDURE TRAY) ×4 IMPLANT
TROCAR XCEL BLUNT TIP 100MML (ENDOMECHANICALS) IMPLANT
TROCAR XCEL NON-BLD 11X100MML (ENDOMECHANICALS) ×1 IMPLANT
TROCAR XCEL NON-BLD 5MMX100MML (ENDOMECHANICALS) ×4 IMPLANT
TUBING INSUFFLATION (TUBING) ×4 IMPLANT

## 2015-09-15 NOTE — Op Note (Signed)
09/15/2015  1:49 PM  PATIENT:  Caitlin Irwin  65 y.o. female [id number] 20-Apr-1951  PRE-OPERATIVE DIAGNOSIS:  Incarcerated Epigastric hernia  POST-OPERATIVE DIAGNOSIS:  Incarcerated Epigastric hernia  PROCEDURE:  Procedure(s): OPEN HERNIA REPAIR EPIGASTRIC ADULT  SURGEON:  Surgeon(s): Greer Pickerel, MD  ASSISTANTS: Algis Greenhouse, RNFA   ANESTHESIA:   general  DRAINS: none   LOCAL MEDICATIONS USED:  OTHER 30cc exparel  SPECIMEN:  Source of Specimen:  preperitoneal fat  DISPOSITION OF SPECIMEN:  discarded  COUNTS:  YES  INDICATION FOR PROCEDURE: 65 year old female with a known long-standing epigastric hernia presented for surgical repair. We have discussed at length the risk and benefits. We also discussed possible diagnostic laparoscopy with mesh fixation depending on size of hernia defect. We also discussed at length her postoperative course.  PROCEDURE: after obtaining informed consent the patient was taken back to operating room 2 at [facility] placed supine on the operating room table. General endotracheal anesthesia was established. Sequential compression devices were placed. Her left arm was tucked with the appropriate padding. Her abdomen was prepped and draped in the usual standard surgical fashion with ChloraPrep. She received IV Ancef prior to skin incision. A surgical timeout was performed. Her epigastric hernia was in her upper midline. There is a prominent bulge. Using a marking pen and made a vertical Mark from her epigastrium down to her umbilicus.  I then made a 4 cm incision with a #10 blade. The subcutaneous tissue was divided. I came across the herniated fat. I then circumferentially isolated the fascial defect. The herniated fat wasn't eeasily reduced. I therefore decided to amputate it with Bovie electrocautery. The defect was still visualized at this point. The defect was less than half a centimeter. I was able to place a Kelly through the defect and was  within the abdominal cavity. Given how small the defect was I felt it did not warrant mesh underlay. Moreover she has a low BMI, does not do any heavy lifting, is not a diabetic or smoker therefore I did not think she needed mesh given the fact that the defect was less than half a centimeter. I decided to primarily repair it. The defect was closed transversely with 3 interrupted #1 Novafil sutures. Exparel was infiltrated in the subcutaneous tissue and in the fascia. Deep dermis was reapproximated with inverted interrupted 3-0 Vicryl sutures. Skin was closed with a running 4-0 Monocryl in a subcuticular fashion followed by the application of benzoin, Steri-Strips, sterile gauze and occlusive dressing. The patient was extubated and taken to the recovery room in stable condition. All needle, instrument, and sponge counts were correct 2. There were no immediate complications. The patient tolerated the procedure well.  PLAN OF CARE: Discharge to home after PACU  PATIENT DISPOSITION:  PACU - hemodynamically stable.   Delay start of Pharmacological VTE agent (>24hrs) due to surgical blood loss or risk of bleeding:  not applicable  Leighton Ruff. Redmond Pulling, MD, FACS General, Bariatric, & Minimally Invasive Surgery [facility], [place]

## 2015-09-15 NOTE — Discharge Instructions (Signed)
[facility]  UMBILICAL OR INGUINAL HERNIA REPAIR: POST OP INSTRUCTIONS  Always review your discharge instruction sheet given to you by the facility where your surgery was performed. IF YOU HAVE DISABILITY OR FAMILY LEAVE FORMS, YOU MUST BRING THEM TO THE OFFICE FOR PROCESSING.   DO NOT GIVE THEM TO YOUR DOCTOR.  1. A  prescription for pain medication may be given to you upon discharge.  Take your pain medication as prescribed, if needed.  If narcotic pain medicine is not needed, then you may take acetaminophen (Tylenol) or ibuprofen (Advil) as needed. 2. Take your usually prescribed medications unless otherwise directed. 3. If you need a refill on your pain medication, please contact your pharmacy.  They will contact our office to request authorization. Prescriptions will not be filled after 5 pm or on week-ends. 4. You should follow a light diet the first 24 hours after arrival home, such as soup and crackers, etc.  Be sure to include lots of fluids daily.  Resume your normal diet the day after surgery. 5. Most patients will experience some swelling and bruising around the umbilicus or in the groin and scrotum.  Ice packs and reclining will help.  Swelling and bruising can take several days to resolve.  6. It is common to experience some constipation if taking pain medication after surgery.  Increasing fluid intake and taking a stool softener (such as Colace) will usually help or prevent this problem from occurring.  A mild laxative (Milk of Magnesia or Miralax) should be taken according to package directions if there are no bowel movements after 48 hours. 7. Unless discharge instructions indicate otherwise, you may remove your bandages 48 hours after surgery, and you may shower at that time.  You  have steri-strips (small skin tapes) in place directly over the incision.  These strips should be left on the skin for 7-10 days.  8. ACTIVITIES:  You may resume regular (light) daily  activities beginning the next day--such as daily self-care, walking, climbing stairs--gradually increasing activities as tolerated.  You may have sexual intercourse when it is comfortable.  Refrain from any heavy lifting or straining until approved by your doctor. a. You may drive when you are no longer taking prescription pain medication, you can comfortably wear a seatbelt, and you can safely maneuver your car and apply brakes. b. RETURN TO WORK:  9. You should see your doctor in the office for a follow-up appointment approximately 2-3 weeks after your surgery.  Make sure that you call for this appointment within a day or two after you arrive home to insure a convenient appointment time. 10. OTHER INSTRUCTIONS: DO NOT LIFT, PUSH, OR PULL ANYTHING GREATER THAN 10 POUNDS FOR 4 WEEKS    WHEN TO CALL YOUR DOCTOR: 1. Fever over 101.0 2. Inability to urinate 3. Nausea and/or vomiting 4. Extreme swelling or bruising 5. Continued bleeding from incision. 6. Increased pain, redness, or drainage from the incision  The clinic staff is available to answer your questions during regular business hours.  Please dont hesitate to call and ask to speak to one of the nurses for clinical concerns.  If you have a medical emergency, go to the nearest emergency room or call 911.  A surgeon from [facility] is always on call at the hospital   [address] [place], [place], [place]  [address] ?  [address], [place],    [address] [phone] ? [phone] ? FAX [phone]  Web site: [email or web address]

## 2015-09-15 NOTE — Anesthesia Preprocedure Evaluation (Addendum)
Anesthesia Evaluation  Patient identified by MRN, date of birth, ID band Patient awake    Reviewed: Allergy & Precautions, NPO status , Patient's Chart, lab work & pertinent test results  Airway Mallampati: II  TM Distance: >3 FB Neck ROM: Full    Dental  (+) Teeth Intact, Dental Advisory Given   Pulmonary    breath sounds clear to auscultation       Cardiovascular  Rhythm:Regular Rate:Normal     Neuro/Psych    GI/Hepatic   Endo/Other    Renal/GU      Musculoskeletal   Abdominal   Peds  Hematology   Anesthesia Other Findings   Reproductive/Obstetrics                             Anesthesia Physical Anesthesia Plan  ASA: II  Anesthesia Plan: General   Post-op Pain Management:    Induction: Intravenous  Airway Management Planned: Oral ETT  Additional Equipment:   Intra-op Plan:   Post-operative Plan: Extubation in OR  Informed Consent: I have reviewed the patients History and Physical, chart, labs and discussed the procedure including the risks, benefits and alternatives for the proposed anesthesia with the patient or authorized representative who has indicated his/her understanding and acceptance.   Dental advisory given  Plan Discussed with: CRNA and Anesthesiologist  Anesthesia Plan Comments:         Anesthesia Quick Evaluation  

## 2015-09-15 NOTE — Interval H&P Note (Signed)
History and Physical Interval Note:  09/15/2015 12:38 PM  Caitlin Irwin  has presented today for surgery, with the diagnosis of Epigastric hernia  The various methods of treatment have been discussed with the patient and family. After consideration of risks, benefits and other options for treatment, the patient has consented to  Procedure(s): [place] (N/A) INSERTION OF MESH (N/A) as a surgical intervention .  The patient's history has been reviewed, patient examined, no change in status, stable for surgery.  I have reviewed the patient's chart and labs.  Questions were answered to the patient's satisfaction.    Leighton Ruff. Redmond Pulling, MD, [place], Bariatric, & Minimally Invasive Surgery [facility], [place]    [facility] M

## 2015-09-15 NOTE — Anesthesia Procedure Notes (Addendum)
Procedure Name: MAC Date/Time: 09/15/2015 1:10 PM Performed by: Willeen Cass P Pre-anesthesia Checklist: Patient identified, Emergency Drugs available, Suction available, Patient being monitored and Timeout performed Patient Re-evaluated:Patient Re-evaluated prior to inductionOxygen Delivery Method: Circle system utilized Preoxygenation: Pre-oxygenation with 100% oxygen Intubation Type: IV induction Ventilation: Mask ventilation without difficulty Laryngoscope Size: Mac and 3 Grade View: Grade I Tube type: Oral Tube size: 7.0 mm Number of attempts: 2 Airway Equipment and Method: Stylet Placement Confirmation: ETT inserted through vocal cords under direct vision,  positive ETCO2,  CO2 detector and breath sounds checked- equal and bilateral Secured at: 20 (incisor) cm Tube secured with: Tape Dental Injury: Teeth and Oropharynx as per pre-operative assessment  Comments: Intubation by [place]. 1st attempt resulted in esophageal intubation, immediately recognized and ETT removed.  DL repeated with Mac 3, Gr I view for +etCO2, BS=B.

## 2015-09-15 NOTE — Anesthesia Postprocedure Evaluation (Signed)
Anesthesia Post Note  Patient: Caitlin Irwin  Procedure(s) Performed: Procedure(s): OPEN HERNIA REPAIR EPIGASTRIC ADULT  Patient location during evaluation: PACU Anesthesia Type: General Level of consciousness: awake and awake and alert Pain management: pain level controlled Vital Signs Assessment: post-procedure vital signs reviewed and stable Respiratory status: spontaneous breathing, nonlabored ventilation and respiratory function stable Anesthetic complications: no    Last Vitals:  Filed Vitals:   09/15/15 1515 09/15/15 1530  BP:    Pulse: 69 67  Temp:  36.2 C  Resp: 17 13    Last Pain:  Filed Vitals:   09/15/15 1814  PainSc: 4                  Ellisa Devivo COKER

## 2015-09-15 NOTE — Transfer of Care (Signed)
Immediate Anesthesia Transfer of Care Note  Patient: Caitlin Irwin  Procedure(s) Performed: Procedure(s): OPEN HERNIA REPAIR EPIGASTRIC ADULT  Patient Location: PACU  Anesthesia Type:General  Level of Consciousness: awake, alert , oriented and patient cooperative  Airway & Oxygen Therapy: Patient Spontanous Breathing and Patient connected to nasal cannula oxygen  Post-op Assessment: Report given to RN, Post -op Vital signs reviewed and stable and Patient moving all extremities  Post vital signs: Reviewed and stable  Last Vitals:  Filed Vitals:   09/15/15 1141 09/15/15 1404  BP: 147/59 97/57  Pulse: 88 75  Temp: 36.8 C 36.4 C  Resp: 20 21    Complications: No apparent anesthesia complications

## 2015-09-15 NOTE — Progress Notes (Signed)
States she had a stomach virus and did stay over night in the hospital. She states she lost about 10 pounds, and is trying to gain it back.

## 2015-09-16 ENCOUNTER — Encounter (HOSPITAL_COMMUNITY): Payer: Self-pay | Admitting: General Surgery

## 2015-09-16 DIAGNOSIS — K439 Ventral hernia without obstruction or gangrene: Secondary | ICD-10-CM | POA: Diagnosis not present

## 2015-09-16 LAB — CBC
HCT: 30.9 % — ABNORMAL LOW (ref 36.0–46.0)
HEMOGLOBIN: 9.5 g/dL — AB (ref 12.0–15.0)
MCH: 24.1 pg — AB (ref 26.0–34.0)
MCHC: 30.7 g/dL (ref 30.0–36.0)
MCV: 78.4 fL (ref 78.0–100.0)
PLATELETS: 261 10*3/uL (ref 150–400)
RBC: 3.94 MIL/uL (ref 3.87–5.11)
RDW: 18.1 % — AB (ref 11.5–15.5)
WBC: 9 10*3/uL (ref 4.0–10.5)

## 2015-09-16 LAB — BASIC METABOLIC PANEL
Anion gap: 6 (ref 5–15)
BUN: 8 mg/dL (ref 6–20)
CHLORIDE: 107 mmol/L (ref 101–111)
CO2: 25 mmol/L (ref 22–32)
CREATININE: 1 mg/dL (ref 0.44–1.00)
Calcium: 9 mg/dL (ref 8.9–10.3)
GFR calc Af Amer: >60 mL/min (ref >60)
GFR calc non Af Amer: 57 mL/min — ABNORMAL LOW (ref >60)
Glucose, Bld: 87 mg/dL (ref 65–99)
Potassium: 3.5 mmol/L (ref 3.5–5.1)
SODIUM: 138 mmol/L (ref 135–145)

## 2015-09-16 NOTE — Discharge Summary (Signed)
Physician Discharge Summary  Caitlin Irwin [id number] DOB: [birth year] DOA: 09/15/2015  PCP: Criselda Peaches, MD  Admit date: 09/15/2015 Discharge date: 09/16/2015  Recommendations for Outpatient Follow-up:   Follow-up Information    Follow up with Gayland Curry, MD. Schedule an appointment as soon as possible for a visit in 3 weeks.   Specialty:  General Surgery   Why:  For wound re-check   Contact information:   [place] [address] [phone]      Discharge Diagnoses:  1. Incarcerated epigastric hernia 2. Chronic anemia 3. Hypothyroidism 4.   Surgical Procedure: open repair of incarcerated epigastric hernia  Discharge Condition: good Disposition: home  Diet recommendation: regular  Filed Weights   09/15/15 1141  Weight: 51.256 kg (113 lb)   Hospital Course:  65yo female came in for planned repair of chronically incarcerated epigastric hernia. Please see op note for additional details. exparel was used as local during surgery. She was kept overnight for observation. She did well. On pod 1, her pain was minimal and well controlled. She was tolerating a diet, her vitals were stable, she was ambulating. We discussed dc instructions  BP 110/47 mmHg  Pulse 67  Temp(Src) 98.2 F (36.8 C) (Oral)  Resp 17  Ht 4\' 10"  (1.473 m)  Wt 51.256 kg (113 lb)  BMI 23.62 kg/m2  SpO2 99%  Gen: alert, NAD, non-toxic appearing Pupils: equal, no scleral icterus Pulm: Lungs clear to auscultation, symmetric chest rise CV: regular rate and rhythm Abd: soft, nontender, nondistended. Dressing c/d/i. No cellulitis. No incisional hernia Ext: no edema, no calf tenderness Skin: no rash, no jaundice    Discharge Instructions  Discharge Instructions    Call MD for:  hives    Complete by:  As directed      Call MD for:  persistant dizziness or light-headedness    Complete by:  As directed      Call MD for:  persistant nausea and vomiting    Complete  by:  As directed      Call MD for:  redness, tenderness, or signs of infection (pain, swelling, redness, odor or green/yellow discharge around incision site)    Complete by:  As directed      Call MD for:  severe uncontrolled pain    Complete by:  As directed      Call MD for:    Complete by:  As directed   Temperature >101     Diet - low sodium heart healthy    Complete by:  As directed      Discharge instructions    Complete by:  As directed   See [facility] discharge instructions     Increase activity slowly    Complete by:  As directed             Medication List    STOP taking these medications        promethazine 25 MG tablet  Commonly known as:  PHENERGAN      TAKE these medications        ALPRAZolam 1 MG tablet  Commonly known as:  XANAX  TAKE 1 TABLET BY MOUTH TWICE A DAY AS NEEDED FOR ANXIETY     ARIPiprazole 5 MG tablet  Commonly known as:  ABILIFY  Take 1 tablet (5 mg total) by mouth daily.     citalopram 10 MG tablet  Commonly known as:  CELEXA  Take 10 mg by mouth at bedtime.  estradiol 1 MG tablet  Commonly known as:  ESTRACE  Take 1 mg by mouth daily.     FIBER SELECT GUMMIES PO  Take 1 tablet by mouth daily as needed (supplement).     gabapentin 300 MG capsule  Commonly known as:  NEURONTIN  TAKE 1 CAPSULE (300 MG TOTAL) BY MOUTH 3 (THREE) TIMES DAILY.     levothyroxine 75 MCG tablet  Commonly known as:  SYNTHROID, LEVOTHROID  Take 75 mcg by mouth daily before breakfast.     methadone 5 MG tablet  Commonly known as:  DOLOPHINE  Take 5 mg by mouth daily.     ondansetron 4 MG tablet  Commonly known as:  ZOFRAN  Take 1 tablet (4 mg total) by mouth every 6 (six) hours.     oxyCODONE 5 MG immediate release tablet  Commonly known as:  Oxy IR/ROXICODONE  Take 1-2 tablets (5-10 mg total) by mouth every 6 (six) hours as needed.     pantoprazole 40 MG tablet  Commonly known as:  PROTONIX  Take 40 mg by mouth daily.     pilocarpine 5 MG  tablet  Commonly known as:  SALAGEN  TAKE 1/2-1 TABLET BY MOUTH 4 TIMES DAILY AS NEEDED FOR DRY MOUTH     spironolactone 100 MG tablet  Commonly known as:  ALDACTONE  Take 100 mg by mouth daily before breakfast.     SUMAtriptan 100 MG tablet  Commonly known as:  IMITREX  TAKE 1 TABLET MAY REPEAT IN 2 HOURS IF HEADACHE PERSIST OR RECURES     topiramate 25 MG tablet  Commonly known as:  TOPAMAX  Take 50 mg by mouth 2 (two) times daily.     traMADol 50 MG tablet  Commonly known as:  ULTRAM  Take 50-100 mg by mouth every 8 (eight) hours as needed for moderate pain or severe pain.     traZODone 100 MG tablet  Commonly known as:  DESYREL  Take 50-100 mg by mouth at bedtime.     valACYclovir 500 MG tablet  Commonly known as:  VALTREX  Take 500 mg by mouth daily.           Follow-up Information    Follow up with Gayland Curry, MD. Schedule an appointment as soon as possible for a visit in 3 weeks.   Specialty:  General Surgery   Why:  For wound re-check   Contact information:   [place] [address] [phone]        The results of significant diagnostics from this hospitalization (including imaging, microbiology, ancillary and laboratory) are listed below for reference.    Significant Diagnostic Studies: No results found.  Microbiology: No results found for this or any previous visit (from the past 240 hour(s)).   Labs: Basic Metabolic Panel:  Recent Labs Lab 09/11/15 0916  NA 139  K 3.8  CL 107  CO2 24  GLUCOSE 89  BUN 9  CREATININE 0.99  CALCIUM 9.5   Liver Function Tests: No results for input(s): AST, ALT, ALKPHOS, BILITOT, PROT, ALBUMIN in the last 168 hours. No results for input(s): LIPASE, AMYLASE in the last 168 hours. No results for input(s): AMMONIA in the last 168 hours. CBC:  Recent Labs Lab 09/11/15 0916  WBC 5.3  HGB 10.7*  HCT 33.8*  MCV 79.5  PLT 243   Cardiac Enzymes: No results for input(s):  CKTOTAL, CKMB, CKMBINDEX, TROPONINI in the last 168 hours. BNP: BNP (last  3 results) No results for input(s): BNP in the last 8760 hours.  ProBNP (last 3 results) No results for input(s): PROBNP in the last 8760 hours.  CBG: No results for input(s): GLUCAP in the last 168 hours.  Active Problems:   Epigastric hernia   Time coordinating discharge: 15 minutes  Signed:  Gayland Curry, MD [facility], [place] [phone] 09/16/2015, 7:55 AM

## 2015-10-16 DIAGNOSIS — G43109 Migraine with aura, not intractable, without status migrainosus: Secondary | ICD-10-CM | POA: Diagnosis not present

## 2015-10-16 DIAGNOSIS — R51 Headache: Secondary | ICD-10-CM | POA: Diagnosis not present

## 2015-10-16 DIAGNOSIS — G894 Chronic pain syndrome: Secondary | ICD-10-CM | POA: Diagnosis not present

## 2015-10-16 DIAGNOSIS — Z79891 Long term (current) use of opiate analgesic: Secondary | ICD-10-CM | POA: Diagnosis not present

## 2015-10-19 DIAGNOSIS — H25813 Combined forms of age-related cataract, bilateral: Secondary | ICD-10-CM | POA: Diagnosis not present

## 2015-10-21 DIAGNOSIS — G500 Trigeminal neuralgia: Secondary | ICD-10-CM | POA: Diagnosis not present

## 2015-10-21 DIAGNOSIS — K140 Glossitis: Secondary | ICD-10-CM | POA: Insufficient documentation

## 2015-10-21 DIAGNOSIS — M26623 Arthralgia of bilateral temporomandibular joint: Secondary | ICD-10-CM | POA: Insufficient documentation

## 2015-10-26 DIAGNOSIS — H25812 Combined forms of age-related cataract, left eye: Secondary | ICD-10-CM | POA: Diagnosis not present

## 2015-10-26 DIAGNOSIS — H2512 Age-related nuclear cataract, left eye: Secondary | ICD-10-CM | POA: Diagnosis not present

## 2015-11-02 DIAGNOSIS — F3341 Major depressive disorder, recurrent, in partial remission: Secondary | ICD-10-CM | POA: Diagnosis not present

## 2015-11-12 ENCOUNTER — Emergency Department (HOSPITAL_COMMUNITY): Payer: PPO

## 2015-11-12 ENCOUNTER — Encounter (HOSPITAL_COMMUNITY): Payer: Self-pay

## 2015-11-12 ENCOUNTER — Emergency Department (HOSPITAL_COMMUNITY)
Admission: EM | Admit: 2015-11-12 | Discharge: 2015-11-13 | Disposition: A | Discharge status: Home / Self Care | Payer: PPO | Attending: Emergency Medicine | Admitting: Emergency Medicine

## 2015-11-12 DIAGNOSIS — E860 Dehydration: Secondary | ICD-10-CM | POA: Diagnosis not present

## 2015-11-12 DIAGNOSIS — Z9889 Other specified postprocedural states: Secondary | ICD-10-CM | POA: Diagnosis not present

## 2015-11-12 DIAGNOSIS — H269 Unspecified cataract: Secondary | ICD-10-CM | POA: Diagnosis not present

## 2015-11-12 DIAGNOSIS — N281 Cyst of kidney, acquired: Secondary | ICD-10-CM | POA: Diagnosis not present

## 2015-11-12 DIAGNOSIS — Z79899 Other long term (current) drug therapy: Secondary | ICD-10-CM | POA: Diagnosis not present

## 2015-11-12 DIAGNOSIS — Z9049 Acquired absence of other specified parts of digestive tract: Secondary | ICD-10-CM | POA: Insufficient documentation

## 2015-11-12 DIAGNOSIS — G43909 Migraine, unspecified, not intractable, without status migrainosus: Secondary | ICD-10-CM | POA: Insufficient documentation

## 2015-11-12 DIAGNOSIS — Z862 Personal history of diseases of the blood and blood-forming organs and certain disorders involving the immune mechanism: Secondary | ICD-10-CM | POA: Diagnosis not present

## 2015-11-12 DIAGNOSIS — K219 Gastro-esophageal reflux disease without esophagitis: Secondary | ICD-10-CM | POA: Diagnosis not present

## 2015-11-12 DIAGNOSIS — R1013 Epigastric pain: Secondary | ICD-10-CM | POA: Insufficient documentation

## 2015-11-12 DIAGNOSIS — Z8711 Personal history of peptic ulcer disease: Secondary | ICD-10-CM | POA: Diagnosis not present

## 2015-11-12 DIAGNOSIS — R42 Dizziness and giddiness: Secondary | ICD-10-CM | POA: Diagnosis not present

## 2015-11-12 DIAGNOSIS — R112 Nausea with vomiting, unspecified: Secondary | ICD-10-CM

## 2015-11-12 DIAGNOSIS — Z8739 Personal history of other diseases of the musculoskeletal system and connective tissue: Secondary | ICD-10-CM | POA: Insufficient documentation

## 2015-11-12 DIAGNOSIS — F329 Major depressive disorder, single episode, unspecified: Secondary | ICD-10-CM | POA: Insufficient documentation

## 2015-11-12 DIAGNOSIS — G8929 Other chronic pain: Secondary | ICD-10-CM | POA: Diagnosis not present

## 2015-11-12 DIAGNOSIS — F419 Anxiety disorder, unspecified: Secondary | ICD-10-CM | POA: Diagnosis not present

## 2015-11-12 DIAGNOSIS — Z79818 Long term (current) use of other agents affecting estrogen receptors and estrogen levels: Secondary | ICD-10-CM | POA: Insufficient documentation

## 2015-11-12 DIAGNOSIS — E039 Hypothyroidism, unspecified: Secondary | ICD-10-CM | POA: Insufficient documentation

## 2015-11-12 DIAGNOSIS — K5909 Other constipation: Secondary | ICD-10-CM | POA: Insufficient documentation

## 2015-11-12 DIAGNOSIS — Z90710 Acquired absence of both cervix and uterus: Secondary | ICD-10-CM | POA: Insufficient documentation

## 2015-11-12 DIAGNOSIS — Z86018 Personal history of other benign neoplasm: Secondary | ICD-10-CM | POA: Diagnosis not present

## 2015-11-12 LAB — COMPREHENSIVE METABOLIC PANEL
ALT: 14 U/L (ref 14–54)
AST: 13 U/L — AB (ref 15–41)
Albumin: 3.1 g/dL — ABNORMAL LOW (ref 3.5–5.0)
Alkaline Phosphatase: 76 U/L (ref 38–126)
Anion gap: 8 (ref 5–15)
BILIRUBIN TOTAL: 0.1 mg/dL — AB (ref 0.3–1.2)
BUN: 10 mg/dL (ref 6–20)
CHLORIDE: 103 mmol/L (ref 101–111)
CO2: 25 mmol/L (ref 22–32)
CREATININE: 0.93 mg/dL (ref 0.44–1.00)
Calcium: 9.2 mg/dL (ref 8.9–10.3)
Glucose, Bld: 112 mg/dL — ABNORMAL HIGH (ref 65–99)
POTASSIUM: 3.4 mmol/L — AB (ref 3.5–5.1)
Sodium: 136 mmol/L (ref 135–145)
TOTAL PROTEIN: 6 g/dL — AB (ref 6.5–8.1)

## 2015-11-12 LAB — CBC
HEMATOCRIT: 33.9 % — AB (ref 36.0–46.0)
Hemoglobin: 10.3 g/dL — ABNORMAL LOW (ref 12.0–15.0)
MCH: 24.2 pg — ABNORMAL LOW (ref 26.0–34.0)
MCHC: 30.4 g/dL (ref 30.0–36.0)
MCV: 79.6 fL (ref 78.0–100.0)
PLATELETS: 271 10*3/uL (ref 150–400)
RBC: 4.26 MIL/uL (ref 3.87–5.11)
RDW: 16.7 % — AB (ref 11.5–15.5)
WBC: 8.6 10*3/uL (ref 4.0–10.5)

## 2015-11-12 LAB — URINALYSIS, ROUTINE W REFLEX MICROSCOPIC
Bilirubin Urine: NEGATIVE
Glucose, UA: NEGATIVE mg/dL
Hgb urine dipstick: NEGATIVE
KETONES UR: NEGATIVE mg/dL
LEUKOCYTES UA: NEGATIVE
NITRITE: NEGATIVE
PROTEIN: NEGATIVE mg/dL
Specific Gravity, Urine: 1.019 (ref 1.005–1.030)
pH: 6 (ref 5.0–8.0)

## 2015-11-12 LAB — LIPASE, BLOOD: LIPASE: 51 U/L (ref 11–51)

## 2015-11-12 MED ORDER — ONDANSETRON HCL 4 MG PO TABS: 4.0000 mg | ORAL_TABLET | Freq: Three times a day (TID) | ORAL | Status: DC | PRN

## 2015-11-12 MED ORDER — IOPAMIDOL (ISOVUE-300) INJECTION 61%
INTRAVENOUS | Status: AC
  Administered 2015-11-12: 100 mL
  Filled 2015-11-12: qty 100

## 2015-11-12 MED ORDER — SODIUM CHLORIDE 0.9 % IV BOLUS (SEPSIS)
1000.0000 mL | Freq: Once | INTRAVENOUS | Status: AC
  Administered 2015-11-12: 1000 mL via INTRAVENOUS

## 2015-11-12 MED ORDER — MECLIZINE HCL 25 MG PO TABS
25.0000 mg | ORAL_TABLET | Freq: Once | ORAL | Status: AC
  Administered 2015-11-12: 25 mg via ORAL
  Filled 2015-11-12: qty 1

## 2015-11-12 MED ORDER — ONDANSETRON HCL 4 MG/2ML IJ SOLN
4.0000 mg | Freq: Once | INTRAMUSCULAR | Status: AC
Start: 2015-11-12 — End: 2015-11-12
  Administered 2015-11-12: 4 mg via INTRAVENOUS
  Filled 2015-11-12: qty 2

## 2015-11-12 MED ORDER — LORAZEPAM 2 MG/ML IJ SOLN: 0.5000 mg | Freq: Once | INTRAMUSCULAR | Status: DC

## 2015-11-12 MED ORDER — ONDANSETRON HCL 4 MG/2ML IJ SOLN
4.0000 mg | Freq: Once | INTRAMUSCULAR | Status: AC
  Administered 2015-11-12: 4 mg via INTRAVENOUS
  Filled 2015-11-12: qty 2

## 2015-11-12 NOTE — ED Provider Notes (Signed)
CSN: [id number]     Arrival date & time 11/12/15  1740 History   First MD Initiated Contact with Patient 11/12/15 1744     Chief Complaint  Patient presents with  . Emesis   (Consider location/radiation/quality/duration/timing/severity/associated sxs/prior Treatment) HPI  65 y.o. female with a hx of incarcerated hernia repair 09-16-15, presents to the Emergency Department today complaining of N/V x 5 days. Pt notes that she has been unable to eat for the past 5 days due to the N/V. Pt endorses that she does not get N/V after eating, but she just does not have an appetite. As a result, pt has been dizzy. No diarrhea. No CP/SOB/ABD pain. No headaches. No fevers. No vision changes. Last BM x 5 days ago. Pt states that she is not passing gas since then as well.   Past Medical History  Diagnosis Date  . Other constipation   . Unspecified hypothyroidism   . IBS (irritable bowel syndrome)   . PUD (peptic ulcer disease)   . Anemia   . Anxiety   . History of blood transfusion     "related to OR"  . Cataract     both eyes  . GERD (gastroesophageal reflux disease)   . Osteoporosis   . H/O hiatal hernia   . Depression   . High cholesterol   . Hypersomnia   . Parotid tumor   . Ataxia 03/24/2014  . Accidental poisoning by aspirin 09/2014  . PONV (postoperative nausea and vomiting)     hx of " getting too much" - 1998, slow to take up by body   . Migraines     "maybe once/week now" (09/15/2015)  . Arthritis     "right knee; right shoulder" (09/15/2015)  . Chronic lower back pain     scoliosis  . Trigeminal neuralgia     S/P radiation therapy    Past Surgical History  Procedure Laterality Date  . Knee cartilage surgery Right 1981    "converted from scope to open during the OR; ligament repair"  . Laminectomy and microdiscectomy lumbar spine  1980    L4-5  . Ankle fracture surgery Left   . Back surgery    . Trigeminal nerve decompression      to stop migraines and it did not work   .  Brain surgery      parotid tumor removed   . Esophagogastroduodenoscopy  06/12/2012    Procedure: ESOPHAGOGASTRODUODENOSCOPY (EGD);  Surgeon: Lear Ng, MD;  Location: [facility];  Service: Endoscopy;  Laterality: N/A;  . Gamma knife for trig neuralgia  12/27/11  . Parotid gland tumor excision      benign  . Anterior lumbar fusion  04/2010    L4-5; L5-S1  . Shoulder arthroscopy w/ rotator cuff repair Right 11-2014  . Laparoscopic epigastric hernia repair  09/15/2015    open  . Cholecystectomy open    . Hernia repair    . Fracture surgery    . Abdominal hysterectomy    . Epigastric hernia repair  09/15/2015    Procedure: OPEN HERNIA REPAIR EPIGASTRIC ADULT;  Surgeon: Greer Pickerel, MD;  Location: [facility];  Service: General;;   Family History  Problem Relation Age of Onset  . Ovarian cancer Mother   . Migraines Mother   . Heart disease Maternal Grandfather   . Diabetes Father   . Migraines Brother   . Heart disease Other    Social History  Substance Use Topics  . Smoking status: Never  Smoker   . Smokeless tobacco: Never Used  . Alcohol Use: No   OB History    No data available     Review of Systems ROS reviewed and all are negative for acute change except as noted in the HPI.  Allergies  Acetaminophen  Home Medications   Prior to Admission medications   Medication Sig Start Date End Date Taking? Authorizing Provider  ALPRAZolam (XANAX) 1 MG tablet TAKE 1 TABLET BY MOUTH TWICE A DAY AS NEEDED FOR ANXIETY 06/01/15   Asencion Partridge Dohmeier, MD  ARIPiprazole (ABILIFY) 5 MG tablet Take 1 tablet (5 mg total) by mouth daily. Patient taking differently: Take 2.5 mg by mouth daily.  08/29/14   Shanker Kristeen Mans, MD  citalopram (CELEXA) 10 MG tablet Take 10 mg by mouth at bedtime.    Historical Provider, MD  estradiol (ESTRACE) 1 MG tablet Take 1 mg by mouth daily.  04/21/13   Historical Provider, MD  FIBER SELECT GUMMIES PO Take 1 tablet by mouth daily as needed (supplement).      Historical Provider, MD  gabapentin (NEURONTIN) 300 MG capsule TAKE 1 CAPSULE (300 MG TOTAL) BY MOUTH 3 (THREE) TIMES DAILY. Patient taking differently: Take 300 mg by mouth 2 (two) times daily.  04/15/15   Dennie Bible, NP  levothyroxine (SYNTHROID, LEVOTHROID) 75 MCG tablet Take 75 mcg by mouth daily before breakfast.     Historical Provider, MD  methadone (DOLOPHINE) 5 MG tablet Take 5 mg by mouth daily. 03/22/15   Historical Provider, MD  ondansetron (ZOFRAN) 4 MG tablet Take 1 tablet (4 mg total) by mouth every 6 (six) hours. Patient taking differently: Take 4 mg by mouth every 8 (eight) hours as needed.  06/29/14   Cleatrice Burke, PA-C  oxyCODONE (OXY IR/ROXICODONE) 5 MG immediate release tablet Take 1-2 tablets (5-10 mg total) by mouth every 6 (six) hours as needed. 09/15/15   Greer Pickerel, MD  pantoprazole (PROTONIX) 40 MG tablet Take 40 mg by mouth daily.     Historical Provider, MD  pilocarpine (SALAGEN) 5 MG tablet TAKE 1/2-1 TABLET BY MOUTH 4 TIMES DAILY AS NEEDED FOR DRY MOUTH 02/17/15   Historical Provider, MD  spironolactone (ALDACTONE) 100 MG tablet Take 100 mg by mouth daily before breakfast.     Historical Provider, MD  SUMAtriptan (IMITREX) 100 MG tablet TAKE 1 TABLET MAY REPEAT IN 2 HOURS IF HEADACHE PERSIST OR RECURES 05/17/15   Asencion Partridge Dohmeier, MD  topiramate (TOPAMAX) 25 MG tablet Take 50 mg by mouth 2 (two) times daily. 09/01/15   Historical Provider, MD  traMADol (ULTRAM) 50 MG tablet Take 50-100 mg by mouth every 8 (eight) hours as needed for moderate pain or severe pain.    Historical Provider, MD  traZODone (DESYREL) 100 MG tablet Take 50-100 mg by mouth at bedtime.    Historical Provider, MD  valACYclovir (VALTREX) 500 MG tablet Take 500 mg by mouth daily.    Historical Provider, MD   BP 96/40 mmHg  Pulse 64  Temp(Src) 98.8 F (37.1 C) (Oral)  Resp 18  Ht 4\' 10"  (1.473 m)  Wt 45.36 kg  BMI 20.91 kg/m2  SpO2 99%   Physical Exam  Constitutional: She is  oriented to person, place, and time. She appears well-developed and well-nourished.  HENT:  Head: Normocephalic and atraumatic.  Eyes: EOM are normal. Pupils are equal, round, and reactive to light.  Neck: Normal range of motion.  Cardiovascular: Normal rate, regular rhythm, normal heart sounds and intact  distal pulses.   No murmur heard. Pulmonary/Chest: Effort normal. No respiratory distress. She has no wheezes. She has no rales. She exhibits no tenderness.  Abdominal: Soft. Normal appearance and bowel sounds are normal. There is tenderness in the epigastric area. There is no rigidity, no rebound, no guarding, no tenderness at McBurney's point and negative Murphy's sign. No hernia.  Surgical Incision noted on epigastrium. No erythema or signs of infection noted.   Musculoskeletal: Normal range of motion.  Neurological: She is alert and oriented to person, place, and time.  Skin: Skin is warm and dry.  Psychiatric: She has a normal mood and affect. Her behavior is normal. Thought content normal.  Nursing note and vitals reviewed.  ED Course  Procedures (including critical care time) Labs Review Labs Reviewed  COMPREHENSIVE METABOLIC PANEL - Abnormal; Notable for the following:    Potassium 3.4 (*)    Glucose, Bld 112 (*)    Total Protein 6.0 (*)    Albumin 3.1 (*)    AST 13 (*)    Total Bilirubin 0.1 (*)    All other components within normal limits  CBC - Abnormal; Notable for the following:    Hemoglobin 10.3 (*)    HCT 33.9 (*)    MCH 24.2 (*)    RDW 16.7 (*)    All other components within normal limits  LIPASE, BLOOD  URINALYSIS, ROUTINE W REFLEX MICROSCOPIC (NOT AT [facility])   Imaging Review Ct Abdomen Pelvis W Contrast  11/12/2015  CLINICAL DATA:  Acute onset of generalized abdominal pain, nausea and vomiting. Initial encounter. EXAM: CT ABDOMEN AND PELVIS WITH CONTRAST TECHNIQUE: Multidetector CT imaging of the abdomen and pelvis was performed using the standard protocol  following bolus administration of intravenous contrast. CONTRAST:  139mL ISOVUE-300 IOPAMIDOL (ISOVUE-300) INJECTION 61% COMPARISON:  CT of the abdomen and pelvis from 07/25/2015 FINDINGS: The visualized lung bases are clear. Postoperative change is noted at the interatrial septum. An 8 mm hypodensity within the hepatic dome likely reflects a small cyst. The spleen is unremarkable in appearance. The patient is status post cholecystectomy, with clips noted along the gallbladder fossa. The pancreas and adrenal glands are unremarkable. A few tiny left renal cysts are noted. The kidneys are otherwise unremarkable. There is no evidence of hydronephrosis. No renal or ureteral stones are seen. No perinephric stranding is appreciated. The small bowel is unremarkable in appearance. The stomach is decompressed and grossly unremarkable in appearance. No acute vascular abnormalities are seen. The appendix remains normal in caliber, without evidence of appendicitis. The colon is partially filled with stool. The transverse colon is redundant in appearance. The bladder is mildly distended and grossly unremarkable. The patient is status post hysterectomy. No suspicious adnexal masses are seen. A trace amount of fluid within the pelvis may be physiologic in nature. No inguinal lymphadenopathy is seen. No acute osseous abnormalities are identified. Anterior lumbar spinal fusion hardware is noted at L4-S1. Mild underlying facet disease is noted. IMPRESSION: 1. No acute abnormality seen within the abdomen or pelvis. 2. Suggestion of small hepatic cyst. 3. Few tiny left renal cyst seen. 4. Trace fluid within the pelvis may be physiologic in nature. Electronically Signed   By: Garald Balding M.D.   On: 11/12/2015 21:08   I have personally reviewed and evaluated these images and lab results as part of my medical decision-making.   EKG Interpretation   Date/Time:  Thursday November 12 2015 17:59:45 EDT Ventricular Rate:  67 PR  Interval:  156  QRS Duration: 101 QT Interval:  406 QTC Calculation: 429 R Axis:   -39 Text Interpretation:  Sinus rhythm Incomplete RBBB and LAFB Low voltage,  precordial leads RSR' in V1 or V2, right VCD or RVH Baseline wander in  lead(s) V3 No significant change since last tracing Confirmed by NGUYEN,  EMILY ([id number]) on 11/12/2015 6:07:43 PM      MDM  I have reviewed and evaluated the relevant laboratory values. I have reviewed and evaluated the relevant imaging studies. I personally evaluated and interpreted the relevant EKG. I have reviewed the relevant previous healthcare records. I have reviewed EMS Documentation. I obtained HPI from historian. Patient discussed with supervising physician  ED Course:  Assessment: Pt is a 40yF with hx incarcerated hiatal hernia repair 09-16-15 who presents with N/V x 5 days. Made NPO. On exam, pt in NAD. Nontoxic/nonseptic appearing. VSS. Afebrile. Lungs CTA. Heart RRR. Abdomen TTP epigastrium over surgical site. Labs otherwise unremarkable. CT ABD/Pelvis pending. Given Zofran and fludis in ED. CT scan showed no acute abnormalities. No SBO. Will DC patient home with zofran and symptomatic treatment. At time of discharge, Patient is in no acute distress. Vital Signs are stable. Patient is able to ambulate. Patient able to tolerate PO.    Disposition/Plan:  9:37 PM Sign out: Lonia Skinner, MD   Supervising Physician Harvel Quale, MD   Final diagnoses:  None      Shary Decamp, PA-C 11/12/15 2137  Harvel Quale, MD 11/13/15 [phone]

## 2015-11-12 NOTE — ED Notes (Signed)
Requesting to leave at this time.  States I'm thirsty and hungry and this is taking too long.  Provider made aware.

## 2015-11-12 NOTE — ED Notes (Signed)
Taken to CT at this time. 

## 2015-11-12 NOTE — ED Notes (Signed)
Pt brought in EMS from home for n/v x5 days.  Pt reports she has been unable to eat x5 days and feels dizzy.  Pt is A&Ox4 upon arrival and in NAD.

## 2015-11-13 DIAGNOSIS — R42 Dizziness and giddiness: Secondary | ICD-10-CM | POA: Diagnosis not present

## 2015-11-13 MED ORDER — MECLIZINE HCL 25 MG PO TABS: 25.0000 mg | ORAL_TABLET | Freq: Three times a day (TID) | ORAL | Status: DC | PRN

## 2015-11-13 NOTE — ED Notes (Signed)
Pt left with all her belongings and ambulated out the treatment area.

## 2015-11-13 NOTE — Discharge Instructions (Signed)
Please read and follow all provided instructions.  Your diagnoses today include:  1. Non-intractable vomiting with nausea, vomiting of unspecified type    Tests performed today include:  Vital signs. See below for your results today.   Medications prescribed:   Take as prescribed   Home care instructions:  Follow any educational materials contained in this packet.  Follow-up instructions: Please follow-up with your primary care provider in the next week for further evaluation of symptoms and treatment   Return instructions:   Please return to the Emergency Department if you do not get better, if you get worse, or new symptoms OR  - Fever (temperature greater than 101.62F)  - Bleeding that does not stop with holding pressure to the area    -Severe pain (please note that you may be more sore the day after your accident)  - Chest Pain  - Difficulty breathing  - Severe nausea or vomiting  - Inability to tolerate food and liquids  - Passing out  - Skin becoming red around your wounds  - Change in mental status (confusion or lethargy)  - New numbness or weakness     Please return if you have any other emergent concerns.  Additional Information:  Your vital signs today were: BP 116/67 mmHg   Pulse 60   Temp(Src) 98.8 F (37.1 C) (Oral)   Resp 14   Ht 4\' 10"  (1.473 m)   Wt 45.36 kg   BMI 20.91 kg/m2   SpO2 98% If your blood pressure (BP) was elevated above 135/85 this visit, please have this repeated by your doctor within one month. ---------------  Vertigo Vertigo means you feel like you or your surroundings are moving when they are not. Vertigo can be dangerous if it occurs when you are at work, driving, or performing difficult activities.  CAUSES  Vertigo occurs when there is a conflict of signals sent to your brain from the visual and sensory systems in your body. There are many different causes of vertigo, including:  Infections, especially in the inner ear.  A bad  reaction to a drug or misuse of alcohol and medicines.  Withdrawal from drugs or alcohol.  Rapidly changing positions, such as lying down or rolling over in bed.  A migraine headache.  Decreased blood flow to the brain.  Increased pressure in the brain from a head injury, infection, tumor, or bleeding. SYMPTOMS  You may feel as though the world is spinning around or you are falling to the ground. Because your balance is upset, vertigo can cause nausea and vomiting. You may have involuntary eye movements (nystagmus). DIAGNOSIS  Vertigo is usually diagnosed by physical exam. If the cause of your vertigo is unknown, your caregiver may perform imaging tests, such as an MRI scan (magnetic resonance imaging). TREATMENT  Most cases of vertigo resolve on their own, without treatment. Depending on the cause, your caregiver may prescribe certain medicines. If your vertigo is related to body position issues, your caregiver may recommend movements or procedures to correct the problem. In rare cases, if your vertigo is caused by certain inner ear problems, you may need surgery. HOME CARE INSTRUCTIONS   Follow your caregiver's instructions.  Avoid driving.  Avoid operating heavy machinery.  Avoid performing any tasks that would be dangerous to you or others during a vertigo episode.  Tell your caregiver if you notice that certain medicines seem to be causing your vertigo. Some of the medicines used to treat vertigo episodes can actually make  them worse in some people. SEEK IMMEDIATE MEDICAL CARE IF:   Your medicines do not relieve your vertigo or are making it worse.  You develop problems with talking, walking, weakness, or using your arms, hands, or legs.  You develop severe headaches.  Your nausea or vomiting continues or gets worse.  You develop visual changes.  A family member notices behavioral changes.  Your condition gets worse. MAKE SURE YOU:  Understand these  instructions.  Will watch your condition.  Will get help right away if you are not doing well or get worse.   This information is not intended to replace advice given to you by your health care provider. Make sure you discuss any questions you have with your health care provider.   Document Released: 04/27/2005 Document Revised: 10/10/2011 Document Reviewed: 11/10/2014 Elsevier Interactive Patient Education [employer].

## 2015-11-26 DIAGNOSIS — M25561 Pain in right knee: Secondary | ICD-10-CM | POA: Diagnosis not present

## 2015-11-26 DIAGNOSIS — M1711 Unilateral primary osteoarthritis, right knee: Secondary | ICD-10-CM | POA: Diagnosis not present

## 2015-12-07 DIAGNOSIS — A881 Epidemic vertigo: Secondary | ICD-10-CM | POA: Diagnosis not present

## 2015-12-28 ENCOUNTER — Other Ambulatory Visit: Payer: Self-pay | Admitting: Neurology

## 2015-12-29 NOTE — Telephone Encounter (Signed)
RX for xanax faxed to [facility] on [address]. Received a receipt of confirmation.

## 2016-02-05 DIAGNOSIS — E7800 Pure hypercholesterolemia, unspecified: Secondary | ICD-10-CM | POA: Diagnosis not present

## 2016-02-05 DIAGNOSIS — R5383 Other fatigue: Secondary | ICD-10-CM | POA: Diagnosis not present

## 2016-02-05 DIAGNOSIS — D559 Anemia due to enzyme disorder, unspecified: Secondary | ICD-10-CM | POA: Diagnosis not present

## 2016-02-05 DIAGNOSIS — I152 Hypertension secondary to endocrine disorders: Secondary | ICD-10-CM | POA: Diagnosis not present

## 2016-02-05 DIAGNOSIS — F330 Major depressive disorder, recurrent, mild: Secondary | ICD-10-CM | POA: Diagnosis not present

## 2016-02-05 DIAGNOSIS — E785 Hyperlipidemia, unspecified: Secondary | ICD-10-CM | POA: Diagnosis not present

## 2016-02-05 DIAGNOSIS — E039 Hypothyroidism, unspecified: Secondary | ICD-10-CM | POA: Diagnosis not present

## 2016-02-24 ENCOUNTER — Ambulatory Visit (INDEPENDENT_AMBULATORY_CARE_PROVIDER_SITE_OTHER): Payer: PPO | Admitting: Licensed Clinical Social Worker

## 2016-02-24 DIAGNOSIS — F331 Major depressive disorder, recurrent, moderate: Secondary | ICD-10-CM

## 2016-02-28 ENCOUNTER — Other Ambulatory Visit (HOSPITAL_COMMUNITY): Payer: Self-pay | Admitting: Psychiatry

## 2016-02-28 ENCOUNTER — Other Ambulatory Visit: Payer: Self-pay | Admitting: Neurology

## 2016-02-29 NOTE — Telephone Encounter (Signed)
RX for xanax faxed to [facility] on [address]. Received a receipt of confirmation.

## 2016-03-02 ENCOUNTER — Ambulatory Visit: Payer: PPO | Admitting: Licensed Clinical Social Worker

## 2016-03-09 ENCOUNTER — Ambulatory Visit: Payer: PPO | Admitting: Licensed Clinical Social Worker

## 2016-03-10 DIAGNOSIS — G43109 Migraine with aura, not intractable, without status migrainosus: Secondary | ICD-10-CM | POA: Diagnosis not present

## 2016-03-10 DIAGNOSIS — Z79891 Long term (current) use of opiate analgesic: Secondary | ICD-10-CM | POA: Diagnosis not present

## 2016-03-10 DIAGNOSIS — G894 Chronic pain syndrome: Secondary | ICD-10-CM | POA: Diagnosis not present

## 2016-03-10 DIAGNOSIS — R51 Headache: Secondary | ICD-10-CM | POA: Diagnosis not present

## 2016-03-15 ENCOUNTER — Ambulatory Visit: Payer: PPO | Admitting: Licensed Clinical Social Worker

## 2016-03-29 ENCOUNTER — Ambulatory Visit (INDEPENDENT_AMBULATORY_CARE_PROVIDER_SITE_OTHER): Payer: PPO | Admitting: Licensed Clinical Social Worker

## 2016-03-29 DIAGNOSIS — F332 Major depressive disorder, recurrent severe without psychotic features: Secondary | ICD-10-CM | POA: Diagnosis not present

## 2016-04-08 DIAGNOSIS — Z23 Encounter for immunization: Secondary | ICD-10-CM | POA: Diagnosis not present

## 2016-04-08 DIAGNOSIS — M8580 Other specified disorders of bone density and structure, unspecified site: Secondary | ICD-10-CM | POA: Diagnosis not present

## 2016-04-08 DIAGNOSIS — F330 Major depressive disorder, recurrent, mild: Secondary | ICD-10-CM | POA: Diagnosis not present

## 2016-04-08 DIAGNOSIS — Z0000 Encounter for general adult medical examination without abnormal findings: Secondary | ICD-10-CM | POA: Diagnosis not present

## 2016-04-08 DIAGNOSIS — I739 Peripheral vascular disease, unspecified: Secondary | ICD-10-CM | POA: Diagnosis not present

## 2016-04-14 ENCOUNTER — Ambulatory Visit: Payer: PPO | Admitting: Nurse Practitioner

## 2016-04-19 ENCOUNTER — Encounter: Payer: Self-pay | Admitting: Nurse Practitioner

## 2016-04-19 ENCOUNTER — Ambulatory Visit (INDEPENDENT_AMBULATORY_CARE_PROVIDER_SITE_OTHER): Payer: PPO | Admitting: Nurse Practitioner

## 2016-04-19 VITALS — BP 130/74 | HR 86 | Ht <= 58 in | Wt 115.4 lb

## 2016-04-19 DIAGNOSIS — Z79899 Other long term (current) drug therapy: Secondary | ICD-10-CM | POA: Diagnosis not present

## 2016-04-19 DIAGNOSIS — F32A Depression, unspecified: Secondary | ICD-10-CM

## 2016-04-19 DIAGNOSIS — G500 Trigeminal neuralgia: Secondary | ICD-10-CM | POA: Diagnosis not present

## 2016-04-19 DIAGNOSIS — G43009 Migraine without aura, not intractable, without status migrainosus: Secondary | ICD-10-CM

## 2016-04-19 DIAGNOSIS — F329 Major depressive disorder, single episode, unspecified: Secondary | ICD-10-CM | POA: Diagnosis not present

## 2016-04-19 MED ORDER — GABAPENTIN 300 MG PO CAPS: ORAL_CAPSULE | ORAL | 11 refills | Status: DC

## 2016-04-19 MED ORDER — SUMATRIPTAN SUCCINATE 100 MG PO TABS: ORAL_TABLET | ORAL | 11 refills | Status: DC

## 2016-04-19 NOTE — Progress Notes (Signed)
[facility]  PATIENT: Caitlin Irwin DOB: 10-Mar-1951   REASON FOR VISIT: Trigeminal neuralgia , atypical facial pain, migraines HISTORY FROM: Patient    HISTORY OF PRESENT ILLNESS:UPDATE 04/19/16 CMMs. Caitlin Irwin, 65 year old female returns for  yearly followup. She continues to go psychiatric treatment,grief counseling with depression and insomnia being her major concerns since her husbands death. Patient has been seeing Caitlin Irwin, and was prescribed Celexa and Abilify.  She feels her depression and grief are under better control. Her facial pain is  stable, when she takes her meds she has cut back on the Gabapentin . She  Has seen  Caitlin Irwin  And he has referred her to Caitlin Irwin at [facility]  She  is currently taking  gabapentin  twice daily instead of three times daily. Marland Kitchen Her migraines are in excellent control  on Topamax and Imitrex.She returns for reevaluation.  HISTORY: Caitlin Irwin, 65 year old female, recently widowed, returns for followup.  She had gamma knife with success however continued numbness.  She was scheduled for ablation procedure but her husband died unexpectedly and she canceled the appointment. She is here today to get that rescheduled, it has already been approved by her insurance. She continues to bite the inside of her buccal tissue. There has been no change in her numbness. She returns for reevaluation.  She is severely depressed, and contacted hospice to join a grief counseling group. She has severe insomnia, has lost weight, and is visibly aged.  She has tried all kinds of sleep aids, none gives her the desired effect. She likely overdosed on a mix of pills, and had a breakdown last month.  She has had multiple falls, injured her rotator cuff and back. She is stressed, her house is on the market, she can not find the things she looks for.  She cannot find certain objects out of the inheritance. 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      HISTORY CD- 0 2014 ; gamma knife surgery for facial pain.  She has had success in the treatment of the neuralgia, but numbness remained, see letter for Caitlin Irwin.  He suggested a sphenopalatine ganglion block or ablation. I offered her to discuss this procedure, and this to be done here in [place] with Caitlin Irwin.  Caitlin Irwin is again insomnic, sleeps on Ambien only 2-3 hours , has rebound insomnia from pain.  She is biting the inside of her buccal tissue   REVIEW OF SYSTEMS: Full 14 system review of systems performed and notable only for those listed, all others are neg:  Constitutional: neg  Cardiovascular: neg Ear/Nose/Throat: neg  Skin: neg Eyes: neg Respiratory: neg Gastroitestinal: neg  Hematology/Lymphatic:  Musculoskeletal: joint pain needs total knee Allergy/Immunology: neg Neurological: neg Psychiatric:  anxiety Sleep :  frequent waking   ALLERGIES: Allergies  Allergen Reactions  . Acetaminophen Nausea And Vomiting    HOME MEDICATIONS: Outpatient Medications Prior to Visit  Medication Sig Dispense Refill  . ALPRAZolam (XANAX) 1 MG tablet TAKE 1 TABLET BY MOUTH TWICE A DAY AS NEEDED FOR ANXIETY 60 tablet 1  . ARIPiprazole (ABILIFY) 5 MG tablet Take 1 tablet (5 mg total) by mouth daily. (Patient taking differently: Take 2.5 mg by mouth daily. ) 30 tablet 0  . citalopram (CELEXA) 10 MG tablet Take 10 mg by mouth at bedtime.    Marland Kitchen estradiol (ESTRACE) 1 MG tablet Take 1 mg by mouth daily.     Marland Kitchen FIBER SELECT GUMMIES PO Take 1 tablet by mouth  daily as needed (supplement).     . gabapentin (NEURONTIN) 300 MG capsule TAKE 1 CAPSULE (300 MG TOTAL) BY MOUTH 3 (THREE) TIMES DAILY. (Patient taking differently: Take 300 mg by mouth 2 (two) times daily. ) 90 capsule 11  . levothyroxine (SYNTHROID, LEVOTHROID) 75 MCG tablet Take 75 mcg by mouth daily before breakfast.     . meclizine (ANTIVERT) 25 MG tablet Take 1 tablet (25 mg total) by mouth 3 (three)  times daily as needed for dizziness. 20 tablet 0  . methadone (DOLOPHINE) 5 MG tablet Take 5 mg by mouth daily.  0  . ondansetron (ZOFRAN) 4 MG tablet Take 1 tablet (4 mg total) by mouth every 8 (eight) hours as needed for nausea or vomiting. 15 tablet 0  . oxyCODONE (OXY IR/ROXICODONE) 5 MG immediate release tablet Take 1-2 tablets (5-10 mg total) by mouth every 6 (six) hours as needed. 30 tablet 0  . pantoprazole (PROTONIX) 40 MG tablet Take 40 mg by mouth daily.     . pilocarpine (SALAGEN) 5 MG tablet TAKE 1/2-1 TABLET BY MOUTH 4 TIMES DAILY AS NEEDED FOR DRY MOUTH  2  . spironolactone (ALDACTONE) 100 MG tablet Take 100 mg by mouth daily before breakfast.     . SUMAtriptan (IMITREX) 100 MG tablet TAKE 1 TABLET MAY REPEAT IN 2 HOURS IF HEADACHE PERSIST OR RECURES 15 tablet 11  . topiramate (TOPAMAX) 25 MG tablet Take 50 mg by mouth 2 (two) times daily.  11  . traMADol (ULTRAM) 50 MG tablet Take 50-100 mg by mouth every 8 (eight) hours as needed for moderate pain or severe pain.    . traZODone (DESYREL) 100 MG tablet Take 50-100 mg by mouth at bedtime.    . valACYclovir (VALTREX) 500 MG tablet Take 500 mg by mouth daily.     No facility-administered medications prior to visit.     PAST MEDICAL HISTORY: Past Medical History:  Diagnosis Date  . Accidental poisoning by aspirin 09/2014  . Anemia   . Anxiety   . Arthritis    "right knee; right shoulder" (09/15/2015)  . Ataxia 03/24/2014  . Cataract    both eyes  . Chronic lower back pain    scoliosis  . Depression   . GERD (gastroesophageal reflux disease)   . H/O hiatal hernia   . High cholesterol   . History of blood transfusion    "related to OR"  . Hypersomnia   . IBS (irritable bowel syndrome)   . Migraines    "maybe once/week now" (09/15/2015)  . Osteoporosis   . Other constipation   . Parotid tumor   . PONV (postoperative nausea and vomiting)    hx of " getting too much" - 1998, slow to take up by body   . PUD (peptic ulcer  disease)   . Trigeminal neuralgia    S/P radiation therapy   . Unspecified hypothyroidism     PAST SURGICAL HISTORY: Past Surgical History:  Procedure Laterality Date  . ABDOMINAL HYSTERECTOMY    . ANKLE FRACTURE SURGERY Left   . ANTERIOR LUMBAR FUSION  04/2010   L4-5; L5-S1  . BACK SURGERY    . BRAIN SURGERY     parotid tumor removed   . CHOLECYSTECTOMY OPEN    . EPIGASTRIC HERNIA REPAIR  09/15/2015   Procedure: OPEN HERNIA REPAIR EPIGASTRIC ADULT;  Surgeon: Greer Pickerel, MD;  Location: [facility];  Service: General;;  . ESOPHAGOGASTRODUODENOSCOPY  06/12/2012   Procedure: ESOPHAGOGASTRODUODENOSCOPY (EGD);  Surgeon: Evette Doffing  Aloha Gell, MD;  Location: [facility];  Service: Endoscopy;  Laterality: N/A;  . FRACTURE SURGERY    . gamma knife for trig neuralgia  12/27/11  . [place]   "converted from scope to open during the OR; ligament repair"  . LAMINECTOMY AND MICRODISCECTOMY LUMBAR SPINE  1980   L4-5  . LAPAROSCOPIC EPIGASTRIC HERNIA REPAIR  09/15/2015   open  . PAROTID GLAND TUMOR EXCISION     benign  . SHOULDER ARTHROSCOPY W/ ROTATOR CUFF REPAIR Right 11-2014  . TRIGEMINAL NERVE DECOMPRESSION     to stop migraines and it did not work     FAMILY HISTORY: Family History  Problem Relation Age of Onset  . Ovarian cancer Mother   . Migraines Mother   . Heart disease Maternal Grandfather   . Diabetes Father   . Migraines Brother   . Heart disease Other     SOCIAL HISTORY: Social History   Social History  . Marital status: Widowed    Spouse name: N/A  . Number of children: 0  . Years of education: college   Occupational History  . disabled    Social History Main Topics  . Smoking status: Never Smoker  . Smokeless tobacco: Never Used  . Alcohol use No  . Drug use: No  . Sexual activity: Not Currently   Other Topics Concern  . Not on file   Social History Narrative   Patient lives at home alone.    Patient husband  past away 07-03-13.   Patient is widowed.   Patient is on disability.    Patient is right handed.   Patient has no children.   Patient is a college grad.     PHYSICAL EXAM  Vitals:   04/19/16 1243  BP: 130/74  Pulse: 86  Weight: 115 lb 6.4 oz (52.3 kg)  Height: 4\' 10"  (1.473 m)   Body mass index is 24.12 kg/m. Generalized: thin ,  in no acute distress   Head: normocephalic and atraumatic,  Neck: Supple, no carotid bruits ,   Cardiac: Regular rate rhythm, no murmur  Musculoskeletal: No deformity .  Neurological examination  Mentation: Alert oriented to time, place, history taking. Reports Major depression is better Follows all commands speech and language fluent,   Cranial nerve II-XII:  Pupils were equal round reactive to light extraocular movements were full, visual field were full on confrontational test. Facial sensation and strength were normal. hearing was intact to finger rubbing bilaterally. Uvula tongue midline. head turning and shoulder shrug were normal and symmetric.Tongue protrusion into cheek strength was normal. Motor: normal bulk and tone, full strength in the BUE, BLE, No focal weakness Sensory: normal and symmetric to light touch, pinprick, and vibration  In the upper nd lower extremities  Coordination: finger-nose-finger bilaterally, no dysmetria Reflexes: 2/2, plantar responses were flexor bilaterally. Gait and Station: Rising up from seated position without assistance, has moderate stride, good arm swing, smooth turning, able to perform tiptoe, and heel walking without difficulty. Tandem gait is steady, no assistive device    DIAGNOSTIC DATA (LABS, IMAGING, TESTING) - I reviewed patient records, labs, notes, testing and imaging myself where available.  Lab Results  Component Value Date   WBC 8.6 11/12/2015   HGB 10.3 (L) 11/12/2015   HCT 33.9 (L) 11/12/2015   MCV 79.6 11/12/2015   PLT 271 11/12/2015      Component Value  Date/Time   NA 136 11/12/2015  1800   K 3.4 (L) 11/12/2015 1800   CL 103 11/12/2015 1800   CO2 25 11/12/2015 1800   GLUCOSE 112 (H) 11/12/2015 1800   BUN 10 11/12/2015 1800   CREATININE 0.93 11/12/2015 1800   CALCIUM 9.2 11/12/2015 1800   PROT 6.0 (L) 11/12/2015 1800   ALBUMIN 3.1 (L) 11/12/2015 1800   AST 13 (L) 11/12/2015 1800   ALT 14 11/12/2015 1800   ALKPHOS 76 11/12/2015 1800   BILITOT 0.1 (L) 11/12/2015 1800   GFRNONAA >60 11/12/2015 1800   GFRAA >60 11/12/2015 1800     ASSESSMENT AND PLAN  65 y.o. year old female  has a past medical history of Chronic pain;  ; Anxiety;  Trigeminal neuralgia; S/P radiation therapy;  Depression; Migraines; Neuralgia; Hypersomnia;  and Accidental ASA poisoning (08/2014). Here to follow-up  Continue Gabapentin at current dose will refill Continue Imitrex will refill Continue Topamax at current dose will refill Follow up yearly next with Dohmeier Continue with psychiatric visits,  Dennie Bible, [facility], [facility], APRN  [facility] [address], [place], [place] [address] [phone]

## 2016-04-19 NOTE — Patient Instructions (Signed)
Continue Gabapentin at current dose will refill Continue Imitrex  Med obtained from [place] Continue Topamax at current dose will refill Follow up yearly next with Dohmeier

## 2016-04-20 ENCOUNTER — Ambulatory Visit: Payer: PPO | Admitting: Psychiatry

## 2016-04-20 NOTE — Progress Notes (Signed)
I agree with the assessment and plan as directed by NP .The patient is known to me .   Sandor Arboleda, MD  

## 2016-05-07 ENCOUNTER — Other Ambulatory Visit: Payer: Self-pay | Admitting: Neurology

## 2016-05-07 ENCOUNTER — Other Ambulatory Visit: Payer: Self-pay | Admitting: Nurse Practitioner

## 2016-05-09 NOTE — Telephone Encounter (Signed)
RX for xanax faxed to [facility] on [address]. Received a receipt of confirmation.

## 2016-05-26 DIAGNOSIS — A881 Epidemic vertigo: Secondary | ICD-10-CM | POA: Diagnosis not present

## 2016-05-31 ENCOUNTER — Other Ambulatory Visit: Payer: Self-pay | Admitting: Neurology

## 2016-06-08 ENCOUNTER — Other Ambulatory Visit (HOSPITAL_COMMUNITY): Payer: Self-pay | Admitting: Psychiatry

## 2016-06-13 DIAGNOSIS — H8149 Vertigo of central origin, unspecified ear: Secondary | ICD-10-CM | POA: Diagnosis not present

## 2016-06-22 ENCOUNTER — Ambulatory Visit: Payer: Self-pay | Admitting: Psychiatry

## 2016-06-22 ENCOUNTER — Ambulatory Visit: Payer: PPO | Admitting: Psychiatry

## 2016-07-13 DIAGNOSIS — S93401A Sprain of unspecified ligament of right ankle, initial encounter: Secondary | ICD-10-CM | POA: Diagnosis not present

## 2016-07-13 DIAGNOSIS — H8149 Vertigo of central origin, unspecified ear: Secondary | ICD-10-CM | POA: Diagnosis not present

## 2016-07-14 ENCOUNTER — Other Ambulatory Visit (HOSPITAL_COMMUNITY): Payer: Self-pay | Admitting: Psychiatry

## 2016-07-18 ENCOUNTER — Telehealth: Payer: Self-pay

## 2016-07-18 MED ORDER — ALPRAZOLAM 1 MG PO TABS: 1.0000 mg | ORAL_TABLET | Freq: Two times a day (BID) | ORAL | 1 refills | Status: DC | PRN

## 2016-07-18 NOTE — Telephone Encounter (Signed)
Pt called inquiring if RX has been faxed. RN skyped- still awaiting signature but she will fax it before she leaves the clinic today. msg relayed to pt. She said thank you

## 2016-07-18 NOTE — Telephone Encounter (Signed)
Rx printed and waiting for signature. Once signed I will fax into pharmacy.

## 2016-07-18 NOTE — Telephone Encounter (Signed)
Rx faxed in.

## 2016-07-20 ENCOUNTER — Ambulatory Visit: Payer: PPO | Admitting: Psychiatry

## 2016-08-23 ENCOUNTER — Encounter (HOSPITAL_COMMUNITY): Payer: Self-pay | Admitting: Emergency Medicine

## 2016-08-23 ENCOUNTER — Emergency Department (HOSPITAL_COMMUNITY)
Admission: EM | Admit: 2016-08-23 | Discharge: 2016-08-23 | Disposition: A | Discharge status: Home / Self Care | Payer: PPO | Attending: Emergency Medicine | Admitting: Emergency Medicine

## 2016-08-23 DIAGNOSIS — R51 Headache: Secondary | ICD-10-CM | POA: Insufficient documentation

## 2016-08-23 DIAGNOSIS — G4489 Other headache syndrome: Secondary | ICD-10-CM | POA: Diagnosis not present

## 2016-08-23 DIAGNOSIS — R519 Headache, unspecified: Secondary | ICD-10-CM

## 2016-08-23 DIAGNOSIS — E039 Hypothyroidism, unspecified: Secondary | ICD-10-CM | POA: Diagnosis not present

## 2016-08-23 DIAGNOSIS — R030 Elevated blood-pressure reading, without diagnosis of hypertension: Secondary | ICD-10-CM | POA: Diagnosis not present

## 2016-08-23 MED ORDER — DEXAMETHASONE SODIUM PHOSPHATE 10 MG/ML IJ SOLN
10.0000 mg | Freq: Once | INTRAMUSCULAR | Status: AC
  Administered 2016-08-23: 10 mg via INTRAVENOUS
  Filled 2016-08-23: qty 1

## 2016-08-23 MED ORDER — KETOROLAC TROMETHAMINE 30 MG/ML IJ SOLN
30.0000 mg | Freq: Once | INTRAMUSCULAR | Status: AC
  Administered 2016-08-23: 30 mg via INTRAVENOUS
  Filled 2016-08-23: qty 1

## 2016-08-23 MED ORDER — DIPHENHYDRAMINE HCL 50 MG/ML IJ SOLN
25.0000 mg | Freq: Once | INTRAMUSCULAR | Status: AC
  Administered 2016-08-23: 25 mg via INTRAVENOUS
  Filled 2016-08-23: qty 1

## 2016-08-23 MED ORDER — MAGNESIUM SULFATE 2 GM/50ML IV SOLN
2.0000 g | Freq: Once | INTRAVENOUS | Status: AC
  Administered 2016-08-23: 2 g via INTRAVENOUS
  Filled 2016-08-23: qty 50

## 2016-08-23 MED ORDER — HALOPERIDOL LACTATE 5 MG/ML IJ SOLN
2.0000 mg | Freq: Once | INTRAMUSCULAR | Status: AC
  Administered 2016-08-23: 2 mg via INTRAVENOUS
  Filled 2016-08-23: qty 1

## 2016-08-23 MED ORDER — PROCHLORPERAZINE EDISYLATE 5 MG/ML IJ SOLN
10.0000 mg | Freq: Once | INTRAMUSCULAR | Status: AC
  Administered 2016-08-23: 10 mg via INTRAVENOUS
  Filled 2016-08-23: qty 2

## 2016-08-23 MED ORDER — SODIUM CHLORIDE 0.9 % IV BOLUS (SEPSIS)
1000.0000 mL | Freq: Once | INTRAVENOUS | Status: AC
Start: 2016-08-23 — End: 2016-08-23
  Administered 2016-08-23: 1000 mL via INTRAVENOUS

## 2016-08-23 NOTE — ED Triage Notes (Signed)
Per [facility] pt co headache last night , Hx migraine . Takes methadone . Alert and oriented x 4, ambulatory . Hx psych symptoms.  Intact neuro  Per EMS.

## 2016-08-23 NOTE — Discharge Instructions (Signed)
Continue regular medications. Follow up with your doctor.  ° ° °

## 2016-08-23 NOTE — ED Provider Notes (Signed)
[facility] DEPT Provider Note   CSN: [id number] Arrival date & time: 08/23/16  1121     History   Chief Complaint Chief Complaint  Patient presents with  . Headache    Hx migraine     HPI HALEEMAH COUTURIER is a 66 y.o. female.  HPI ALYVIAH MARTZ is a 66 y.o. female with history of arthritis, depression, chronic pain, migraine headaches, presents to emergency department complaining of a headache.  Patient states this headache started gradually at 10 PM last night. History of similar migraines. She states she normally takes Imitrex for her headache and it gets better, states she took 2 and a did not improve. She did not try any other medications. She states that she does see eye specialist for these headaches. She reports being admitted twice for the same in the past. She denies any sudden onset of thunderclap headache. No changes in vision. No nausea or vomiting. She is photophobic. Denies numbness or weakness in extremities. No neck pain or stiffness. No fever. No head injuries. Nothing is making her symptoms better or worse. Headache does not radiate, it is mainly on the left side, sharp.   Past Medical History:  Diagnosis Date  . Accidental poisoning by aspirin 09/2014  . Anemia   . Anxiety   . Arthritis    "right knee; right shoulder" (09/15/2015)  . Ataxia 03/24/2014  . Cataract    both eyes  . Chronic lower back pain    scoliosis  . Depression   . GERD (gastroesophageal reflux disease)   . H/O hiatal hernia   . High cholesterol   . History of blood transfusion    "related to OR"  . Hypersomnia   . IBS (irritable bowel syndrome)   . Migraines    "maybe once/week now" (09/15/2015)  . Osteoporosis   . Other constipation   . Parotid tumor   . PONV (postoperative nausea and vomiting)    hx of " getting too much" - 1998, slow to take up by body   . PUD (peptic ulcer disease)   . Trigeminal neuralgia    S/P radiation therapy   . Unspecified hypothyroidism      Patient Active Problem List   Diagnosis Date Noted  . Epigastric hernia 09/15/2015  . Migraine, intractable 05/28/2015  . Migraine 05/28/2015  . Headache, migraine   . Insomnia   . Emesis   . History of hypothyroidism   . Microcytic hypochromic anemia   . Salicylate poisoning [id number]  . Migraine headache 08/28/2014  . Hypokalemia 08/28/2014  . Salicylate intoxication 08/28/2014  . Aspirin toxicity   . Ataxia 03/24/2014  . Complicated grief 0000000  . Altered mental status, unspecified altered mental status type 10/13/2013  . Polypharmacy 10/13/2013  . Depression 10/13/2013  . Atypical face pain 02/10/2013  . Esophageal reflux 06/12/2012  . Trigeminal neuralgia 11/07/2011  . Hx of migraine headaches 10/20/2011  . Dysphagia, unspecified(787.20) 09/26/2011  . Weight gain 09/26/2011  . NAUSEA 02/02/2010  . DIARRHEA 02/02/2010  . CHANGE IN BOWELS 02/02/2010  . ULCER-DUODENAL 11/26/2008  . History of peptic ulcer 11/26/2008  . GASTRITIS 11/26/2008  . HYPOTHYROIDISM 10/03/2007  . CONSTIPATION, CHRONIC 10/03/2007  . IRRITABLE BOWEL SYNDROME 10/03/2007  . COLITIS, HX OF 10/03/2007    Past Surgical History:  Procedure Laterality Date  . ABDOMINAL HYSTERECTOMY    . ANKLE FRACTURE SURGERY Left   . ANTERIOR LUMBAR FUSION  04/2010   L4-5; L5-S1  . BACK SURGERY    .  BRAIN SURGERY     parotid tumor removed   . CHOLECYSTECTOMY OPEN    . EPIGASTRIC HERNIA REPAIR  09/15/2015   Procedure: OPEN HERNIA REPAIR EPIGASTRIC ADULT;  Surgeon: Greer Pickerel, MD;  Location: [facility];  Service: General;;  . ESOPHAGOGASTRODUODENOSCOPY  06/12/2012   Procedure: ESOPHAGOGASTRODUODENOSCOPY (EGD);  Surgeon: Lear Ng, MD;  Location: [facility];  Service: Endoscopy;  Laterality: N/A;  . FRACTURE SURGERY    . gamma knife for trig neuralgia  12/27/11  . [place]   "converted from scope to open during the OR; ligament repair"  . LAMINECTOMY  AND MICRODISCECTOMY LUMBAR SPINE  1980   L4-5  . LAPAROSCOPIC EPIGASTRIC HERNIA REPAIR  09/15/2015   open  . PAROTID GLAND TUMOR EXCISION     benign  . SHOULDER ARTHROSCOPY W/ ROTATOR CUFF REPAIR Right 11-2014  . TRIGEMINAL NERVE DECOMPRESSION     to stop migraines and it did not work     OB History    No data available       Home Medications    Prior to Admission medications   Medication Sig Start Date End Date Taking? Authorizing Provider  ALPRAZolam Duanne Moron) 1 MG tablet Take 1 tablet (1 mg total) by mouth 2 (two) times daily as needed for anxiety. 07/18/16   Asencion Partridge Dohmeier, MD  ARIPiprazole (ABILIFY) 5 MG tablet Take 1 tablet (5 mg total) by mouth daily. Patient taking differently: Take 2.5 mg by mouth daily.  08/29/14   Shanker Kristeen Mans, MD  citalopram (CELEXA) 10 MG tablet Take 10 mg by mouth at bedtime.    Historical Provider, MD  estradiol (ESTRACE) 1 MG tablet Take 1 mg by mouth daily.  04/21/13   Historical Provider, MD  FIBER SELECT GUMMIES PO Take 1 tablet by mouth daily as needed (supplement).     Historical Provider, MD  gabapentin (NEURONTIN) 300 MG capsule TAKE 1 CAPSULE (300 MG TOTAL) BY MOUTH 3 (THREE) TIMES DAILY. 04/19/16   Dennie Bible, NP  levothyroxine (SYNTHROID, LEVOTHROID) 75 MCG tablet Take 75 mcg by mouth daily before breakfast.     Historical Provider, MD  meclizine (ANTIVERT) 25 MG tablet Take 1 tablet (25 mg total) by mouth 3 (three) times daily as needed for dizziness. 11/13/15   Harvel Quale, MD  methadone (DOLOPHINE) 5 MG tablet Take 5 mg by mouth daily. 03/22/15   Historical Provider, MD  ondansetron (ZOFRAN) 4 MG tablet Take 1 tablet (4 mg total) by mouth every 8 (eight) hours as needed for nausea or vomiting. 11/12/15   Shary Decamp, PA-C  oxyCODONE (OXY IR/ROXICODONE) 5 MG immediate release tablet Take 1-2 tablets (5-10 mg total) by mouth every 6 (six) hours as needed. 09/15/15   Greer Pickerel, MD  pantoprazole (PROTONIX) 40 MG tablet Take 40 mg  by mouth daily.     Historical Provider, MD  pilocarpine (SALAGEN) 5 MG tablet TAKE 1/2-1 TABLET BY MOUTH 4 TIMES DAILY AS NEEDED FOR DRY MOUTH 02/17/15   Historical Provider, MD  spironolactone (ALDACTONE) 100 MG tablet Take 100 mg by mouth daily before breakfast.     Historical Provider, MD  SUMAtriptan (IMITREX) 100 MG tablet TAKE 1 TABLET MAY REPEAT IN 2 HOURS IF HEADACHE PERSIST OR RECURES 04/19/16   Dennie Bible, NP  topiramate (TOPAMAX) 25 MG tablet Take 50 mg by mouth 2 (two) times daily. 09/01/15   Historical Provider, MD  traMADol (ULTRAM) 50 MG tablet  Take 50-100 mg by mouth every 8 (eight) hours as needed for moderate pain or severe pain.    Historical Provider, MD  traZODone (DESYREL) 100 MG tablet Take 50-100 mg by mouth at bedtime.    Historical Provider, MD  valACYclovir (VALTREX) 500 MG tablet Take 500 mg by mouth daily.    Historical Provider, MD    Family History Family History  Problem Relation Age of Onset  . Ovarian cancer Mother   . Migraines Mother   . Diabetes Father   . Migraines Brother   . Heart disease Other   . Heart disease Maternal Grandfather     Social History Social History  Substance Use Topics  . Smoking status: Never Smoker  . Smokeless tobacco: Never Used  . Alcohol use No     Allergies   Acetaminophen   Review of Systems Review of Systems  Constitutional: Negative for chills and fever.  Eyes: Positive for photophobia. Negative for pain and visual disturbance.  Respiratory: Negative for cough, chest tightness and shortness of breath.   Cardiovascular: Negative for chest pain, palpitations and leg swelling.  Gastrointestinal: Negative for abdominal pain, diarrhea, nausea and vomiting.  Genitourinary: Negative for dysuria, flank pain and pelvic pain.  Musculoskeletal: Negative for arthralgias, myalgias, neck pain and neck stiffness.  Skin: Negative for rash.  Neurological: Positive for headaches. Negative for dizziness and  weakness.  All other systems reviewed and are negative.    Physical Exam Updated Vital Signs BP 126/83   Pulse 80   Temp 98.2 F (36.8 C) (Oral)   Resp 20   SpO2 99%   Physical Exam  Constitutional: She is oriented to person, place, and time. She appears well-developed and well-nourished.  Uncomfortable appearing  HENT:  Head: Normocephalic.  Eyes: Conjunctivae and EOM are normal. Pupils are equal, round, and reactive to light.  Neck: Neck supple.  Cardiovascular: Normal rate, regular rhythm and normal heart sounds.   Pulmonary/Chest: Effort normal and breath sounds normal. No respiratory distress. She has no wheezes. She has no rales.  Abdominal: Soft. Bowel sounds are normal. She exhibits no distension. There is no tenderness. There is no rebound.  Musculoskeletal: She exhibits no edema.  Neurological: She is alert and oriented to person, place, and time.  5/5 and equal upper and lower extremity strength bilaterally. Equal grip strength bilaterally. Normal finger to nose and heel to shin. No pronator drift.   Skin: Skin is warm and dry.  Psychiatric: She has a normal mood and affect. Her behavior is normal.  Nursing note and vitals reviewed.    ED Treatments / Results  Labs (all labs ordered are listed, but only abnormal results are displayed) Labs Reviewed - No data to display  EKG  EKG Interpretation None       Radiology No results found.  Procedures Procedures (including critical care time)  Medications Ordered in ED Medications  sodium chloride 0.9 % bolus 1,000 mL (not administered)  ketorolac (TORADOL) 30 MG/ML injection 30 mg (not administered)  prochlorperazine (COMPAZINE) injection 10 mg (not administered)  diphenhydrAMINE (BENADRYL) injection 25 mg (not administered)     Initial Impression / Assessment and Plan / ED Course  I have reviewed the triage vital signs and the nursing notes.  Pertinent labs & imaging results that were available  during my care of the patient were reviewed by me and considered in my medical decision making (see chart for details).      The patient did emergency department for  headache, typical of her regular migraines. States it may be more severe this time in the past. She reports prior admissions for the same. We will try migraine cocktail. I will reassess. At this time VS normal. She is afebrile. No meningismus. This is atypical for her headache, and she has had extensive workup for the same in the past. I do not think she needs any imaging at this time. Have low concern for cranial bleeding or infectious process.   4:44 PM Patient states that she did not feel any better after Compazine, Benadryl, Toradol. We will try magnesium and Haldol.  Patient feels much better. She is requesting to go home. She feels much improved, in no acute distress, no vomiting. She'll be discharged home with close outpatient follow-up.  Vitals:   08/23/16 1129 08/23/16 1615 08/23/16 1817  BP: 126/83 146/67 157/81  Pulse: 80 79 66  Resp: 20 18 18   Temp: 98.2 F (36.8 C)    TempSrc: Oral    SpO2: 99% 100% 100%     Final Clinical Impressions(s) / ED Diagnoses   Final diagnoses:  Nonintractable headache, unspecified chronicity pattern, unspecified headache type    New Prescriptions New Prescriptions   No medications on file     Jeannett Senior, PA-C 08/23/16 1915    Leo Grosser, MD 08/23/16 2258

## 2016-10-12 ENCOUNTER — Telehealth: Payer: Self-pay

## 2016-10-12 ENCOUNTER — Emergency Department (HOSPITAL_COMMUNITY): Payer: PPO

## 2016-10-12 ENCOUNTER — Encounter (HOSPITAL_COMMUNITY): Payer: Self-pay

## 2016-10-12 ENCOUNTER — Ambulatory Visit: Payer: PPO | Admitting: Neurology

## 2016-10-12 ENCOUNTER — Emergency Department (HOSPITAL_COMMUNITY)
Admission: EM | Admit: 2016-10-12 | Discharge: 2016-10-12 | Disposition: A | Discharge status: Home / Self Care | Payer: PPO | Attending: Emergency Medicine | Admitting: Emergency Medicine

## 2016-10-12 DIAGNOSIS — R51 Headache: Secondary | ICD-10-CM | POA: Insufficient documentation

## 2016-10-12 DIAGNOSIS — R42 Dizziness and giddiness: Secondary | ICD-10-CM | POA: Insufficient documentation

## 2016-10-12 DIAGNOSIS — Z79899 Other long term (current) drug therapy: Secondary | ICD-10-CM | POA: Diagnosis not present

## 2016-10-12 DIAGNOSIS — E039 Hypothyroidism, unspecified: Secondary | ICD-10-CM | POA: Insufficient documentation

## 2016-10-12 DIAGNOSIS — R519 Headache, unspecified: Secondary | ICD-10-CM

## 2016-10-12 DIAGNOSIS — R413 Other amnesia: Secondary | ICD-10-CM | POA: Diagnosis not present

## 2016-10-12 DIAGNOSIS — D649 Anemia, unspecified: Secondary | ICD-10-CM | POA: Diagnosis not present

## 2016-10-12 DIAGNOSIS — H81399 Other peripheral vertigo, unspecified ear: Secondary | ICD-10-CM

## 2016-10-12 LAB — URINALYSIS, ROUTINE W REFLEX MICROSCOPIC
BILIRUBIN URINE: NEGATIVE
Glucose, UA: NEGATIVE mg/dL
HGB URINE DIPSTICK: NEGATIVE
Ketones, ur: NEGATIVE mg/dL
NITRITE: NEGATIVE
PROTEIN: NEGATIVE mg/dL
Specific Gravity, Urine: 1.009 (ref 1.005–1.030)
pH: 7 (ref 5.0–8.0)

## 2016-10-12 LAB — BASIC METABOLIC PANEL
ANION GAP: 8 (ref 5–15)
BUN: 11 mg/dL (ref 6–20)
CALCIUM: 9 mg/dL (ref 8.9–10.3)
CO2: 24 mmol/L (ref 22–32)
Chloride: 108 mmol/L (ref 101–111)
Creatinine, Ser: 0.78 mg/dL (ref 0.44–1.00)
Glucose, Bld: 98 mg/dL (ref 65–99)
POTASSIUM: 4.3 mmol/L (ref 3.5–5.1)
Sodium: 140 mmol/L (ref 135–145)

## 2016-10-12 LAB — CBC
HEMATOCRIT: 35.7 % — AB (ref 36.0–46.0)
HEMOGLOBIN: 11.4 g/dL — AB (ref 12.0–15.0)
MCH: 27.9 pg (ref 26.0–34.0)
MCHC: 31.9 g/dL (ref 30.0–36.0)
MCV: 87.5 fL (ref 78.0–100.0)
Platelets: 242 10*3/uL (ref 150–400)
RBC: 4.08 MIL/uL (ref 3.87–5.11)
RDW: 14.9 % (ref 11.5–15.5)
WBC: 6.7 10*3/uL (ref 4.0–10.5)

## 2016-10-12 MED ORDER — MECLIZINE HCL 25 MG PO TABS: 25.0000 mg | ORAL_TABLET | Freq: Three times a day (TID) | ORAL | 0 refills | Status: DC | PRN

## 2016-10-12 MED ORDER — METOCLOPRAMIDE HCL 5 MG/ML IJ SOLN
10.0000 mg | Freq: Once | INTRAMUSCULAR | Status: AC
  Administered 2016-10-12: 10 mg via INTRAVENOUS
  Filled 2016-10-12: qty 2

## 2016-10-12 MED ORDER — DIPHENHYDRAMINE HCL 50 MG/ML IJ SOLN
25.0000 mg | Freq: Once | INTRAMUSCULAR | Status: AC
  Administered 2016-10-12: 25 mg via INTRAVENOUS
  Filled 2016-10-12: qty 1

## 2016-10-12 MED ORDER — SODIUM CHLORIDE 0.9 % IV BOLUS (SEPSIS)
1000.0000 mL | Freq: Once | INTRAVENOUS | Status: AC
  Administered 2016-10-12: 1000 mL via INTRAVENOUS

## 2016-10-12 MED ORDER — LORAZEPAM 2 MG/ML IJ SOLN
0.5000 mg | Freq: Once | INTRAMUSCULAR | Status: AC
  Administered 2016-10-12: 0.5 mg via INTRAVENOUS
  Filled 2016-10-12: qty 1

## 2016-10-12 MED ORDER — MECLIZINE HCL 25 MG PO TABS
25.0000 mg | ORAL_TABLET | Freq: Once | ORAL | Status: AC
  Administered 2016-10-12: 25 mg via ORAL
  Filled 2016-10-12: qty 1

## 2016-10-12 NOTE — ED Triage Notes (Addendum)
Pt states she has had acute memory loss X2 days. She is alert and oriented to person and place but states she cannot remember how to go places and thus has been unable to drive. Pt also reports headache.

## 2016-10-12 NOTE — Telephone Encounter (Signed)
I got this message from the phone room:  Caitlin Irwin [phone]  is on the phone. said she woke up yesterday with complete loss traveling direction.  said she does not know how to get from pt a to pt b. she can not drive   can she be seen today @ 3:30  Dr. Brett Fairy was in the room with a patient and unable to answer. In the meantime, patient was placed into slot but advised that we will call back if Dr. Brett Fairy advised any differently.   Dr. Brett Fairy recommends that patient go to ED. I called patient back but only received VM. I left VM advising her to go to ED and asked her to call us back and let us know if she received the message.

## 2016-10-12 NOTE — ED Provider Notes (Signed)
[facility] DEPT Provider Note   CSN: [id number] Arrival date & time: 10/12/16  1446     History   Chief Complaint Chief Complaint  Patient presents with  . Memory Loss    HPI Caitlin Irwin is a 66 y.o. female.  She complains of memory loss for the last 2 days. Specifically, she states that while driving, she cannot remember how to get to where she is going. She has no difficulty with directions in her house. At about that same time, she developed some dizziness which she describes as spinning sensation whenever she moves her head. Today, she developed a bifrontal headache which is throbbing in nature and worse with exposure to light. She rates the headache at 7/10. She states that this is different from her usual migraines, so she did not take any migraine medication. She did take ibuprofen without any relief. She complains of feeling generally weak but denies any focal weakness. Her memory loss and dizziness have been stable over the last 2 days.   The history is provided by the patient.    Past Medical History:  Diagnosis Date  . Accidental poisoning by aspirin 09/2014  . Anemia   . Anxiety   . Arthritis    "right knee; right shoulder" (09/15/2015)  . Ataxia 03/24/2014  . Cataract    both eyes  . Chronic lower back pain    scoliosis  . Depression   . GERD (gastroesophageal reflux disease)   . H/O hiatal hernia   . High cholesterol   . History of blood transfusion    "related to OR"  . Hypersomnia   . IBS (irritable bowel syndrome)   . Migraines    "maybe once/week now" (09/15/2015)  . Osteoporosis   . Other constipation   . Parotid tumor   . PONV (postoperative nausea and vomiting)    hx of " getting too much" - 1998, slow to take up by body   . PUD (peptic ulcer disease)   . Trigeminal neuralgia    S/P radiation therapy   . Unspecified hypothyroidism     Patient Active Problem List   Diagnosis Date Noted  . Epigastric hernia 09/15/2015  . Migraine,  intractable 05/28/2015  . Migraine 05/28/2015  . Headache, migraine   . Insomnia   . Emesis   . History of hypothyroidism   . Microcytic hypochromic anemia   . Salicylate poisoning 71/69/6789  . Migraine headache 08/28/2014  . Hypokalemia 08/28/2014  . Salicylate intoxication 08/28/2014  . Aspirin toxicity   . Ataxia 03/24/2014  . Complicated grief 38/05/1750  . Altered mental status, unspecified altered mental status type 10/13/2013  . Polypharmacy 10/13/2013  . Depression 10/13/2013  . Atypical face pain 02/10/2013  . Esophageal reflux 06/12/2012  . Trigeminal neuralgia 11/07/2011  . Hx of migraine headaches 10/20/2011  . Dysphagia, unspecified(787.20) 09/26/2011  . Weight gain 09/26/2011  . NAUSEA 02/02/2010  . DIARRHEA 02/02/2010  . CHANGE IN BOWELS 02/02/2010  . ULCER-DUODENAL 11/26/2008  . History of peptic ulcer 11/26/2008  . GASTRITIS 11/26/2008  . HYPOTHYROIDISM 10/03/2007  . CONSTIPATION, CHRONIC 10/03/2007  . IRRITABLE BOWEL SYNDROME 10/03/2007  . COLITIS, HX OF 10/03/2007    Past Surgical History:  Procedure Laterality Date  . ABDOMINAL HYSTERECTOMY    . ANKLE FRACTURE SURGERY Left   . ANTERIOR LUMBAR FUSION  04/2010   L4-5; L5-S1  . BACK SURGERY    . BRAIN SURGERY     parotid tumor removed   . CHOLECYSTECTOMY  OPEN    . EPIGASTRIC HERNIA REPAIR  09/15/2015   Procedure: OPEN HERNIA REPAIR EPIGASTRIC ADULT;  Surgeon: Greer Pickerel, MD;  Location: [facility];  Service: General;;  . ESOPHAGOGASTRODUODENOSCOPY  06/12/2012   Procedure: ESOPHAGOGASTRODUODENOSCOPY (EGD);  Surgeon: Lear Ng, MD;  Location: [facility];  Service: Endoscopy;  Laterality: N/A;  . FRACTURE SURGERY    . gamma knife for trig neuralgia  12/27/11  . [place]   "converted from scope to open during the OR; ligament repair"  . LAMINECTOMY AND MICRODISCECTOMY LUMBAR SPINE  1980   L4-5  . LAPAROSCOPIC EPIGASTRIC HERNIA REPAIR  09/15/2015    open  . PAROTID GLAND TUMOR EXCISION     benign  . SHOULDER ARTHROSCOPY W/ ROTATOR CUFF REPAIR Right 11-2014  . TRIGEMINAL NERVE DECOMPRESSION     to stop migraines and it did not work     OB History    No data available       Home Medications    Prior to Admission medications   Medication Sig Start Date End Date Taking? Authorizing Provider  ALPRAZolam Duanne Moron) 1 MG tablet Take 1 tablet (1 mg total) by mouth 2 (two) times daily as needed for anxiety. 07/18/16   Asencion Partridge Dohmeier, MD  ARIPiprazole (ABILIFY) 5 MG tablet Take 1 tablet (5 mg total) by mouth daily. Patient not taking: Reported on 08/23/2016 08/29/14   Jonetta Osgood, MD  estradiol (ESTRACE) 1 MG tablet Take 1 mg by mouth daily.  04/21/13   Historical Provider, MD  gabapentin (NEURONTIN) 300 MG capsule TAKE 1 CAPSULE (300 MG TOTAL) BY MOUTH 3 (THREE) TIMES DAILY. 04/19/16   Dennie Bible, NP  levothyroxine (SYNTHROID, LEVOTHROID) 75 MCG tablet Take 75 mcg by mouth daily before breakfast.     Historical Provider, MD  meclizine (ANTIVERT) 25 MG tablet Take 1 tablet (25 mg total) by mouth 3 (three) times daily as needed for dizziness. Patient not taking: Reported on 08/23/2016 11/13/15   Harvel Quale, MD  methadone (DOLOPHINE) 5 MG tablet Take 5 mg by mouth daily. 03/22/15   Historical Provider, MD  ondansetron (ZOFRAN) 4 MG tablet Take 1 tablet (4 mg total) by mouth every 8 (eight) hours as needed for nausea or vomiting. 11/12/15   Shary Decamp, PA-C  oxyCODONE (OXY IR/ROXICODONE) 5 MG immediate release tablet Take 1-2 tablets (5-10 mg total) by mouth every 6 (six) hours as needed. Patient not taking: Reported on 08/23/2016 09/15/15   Greer Pickerel, MD  pantoprazole (PROTONIX) 40 MG tablet Take 40 mg by mouth 2 (two) times daily as needed (ulcer).     Historical Provider, MD  pilocarpine (SALAGEN) 5 MG tablet TAKE 1/2-1 TABLET BY MOUTH 4 TIMES DAILY AS NEEDED FOR DRY MOUTH 02/17/15   Historical Provider, MD  spironolactone  (ALDACTONE) 100 MG tablet Take 100 mg by mouth daily before breakfast.     Historical Provider, MD  SUMAtriptan (IMITREX) 100 MG tablet TAKE 1 TABLET MAY REPEAT IN 2 HOURS IF HEADACHE PERSIST OR RECURES 04/19/16   Dennie Bible, NP  topiramate (TOPAMAX) 25 MG tablet Take 50 mg by mouth 2 (two) times daily. 09/01/15   Historical Provider, MD  traMADol (ULTRAM) 50 MG tablet Take 50-100 mg by mouth every 8 (eight) hours as needed for moderate pain or severe pain.    Historical Provider, MD  traZODone (DESYREL) 100 MG tablet Take 100-200 mg by mouth at bedtime.  Historical Provider, MD  valACYclovir (VALTREX) 500 MG tablet Take 500 mg by mouth daily.    Historical Provider, MD    Family History Family History  Problem Relation Age of Onset  . Ovarian cancer Mother   . Migraines Mother   . Diabetes Father   . Migraines Brother   . Heart disease Other   . Heart disease Maternal Grandfather     Social History Social History  Substance Use Topics  . Smoking status: Never Smoker  . Smokeless tobacco: Never Used  . Alcohol use No     Allergies   Acetaminophen   Review of Systems Review of Systems  All other systems reviewed and are negative.    Physical Exam Updated Vital Signs BP 114/66 (BP Location: Right Arm)   Pulse 101   Temp 97.9 F (36.6 C) (Oral)   Resp 17   Ht 4\' 11"  (1.499 m)   Wt 110 lb (49.9 kg)   SpO2 96%   BMI 22.22 kg/m   Physical Exam  Nursing note and vitals reviewed.  66 year old female, resting comfortably and in no acute distress. Vital signs are normal. Oxygen saturation is 96%, which is normal. Head is normocephalic and atraumatic. PERRLA, EOMI. Oropharynx is clear.Fundi show no hemorrhage, exudate, or papilledema. Neck is nontender and supple without adenopathy or JVD. There are no carotid bruits. Back is nontender and there is no CVA tenderness. Lungs are clear without rales, wheezes, or rhonchi. Chest is nontender. Heart has regular  rate and rhythm without murmur. Abdomen is soft, flat, nontender without masses or hepatosplenomegaly and peristalsis is normoactive. Extremities have no cyanosis or edema, full range of motion is present. Skin is warm and dry without rash. Neurologic: Mental status is normal, cranial nerves are intact, there are no motor or sensory deficits. There is no pronator drift. Finger to nose testing is normal. Dizziness is reproduced by passive head movement.  ED Treatments / Results  Labs (all labs ordered are listed, but only abnormal results are displayed) Labs Reviewed  CBC - Abnormal; Notable for the following:       Result Value   Hemoglobin 11.4 (*)    HCT 35.7 (*)    All other components within normal limits  URINALYSIS, ROUTINE W REFLEX MICROSCOPIC - Abnormal; Notable for the following:    APPearance HAZY (*)    Leukocytes, UA LARGE (*)    Bacteria, UA RARE (*)    Squamous Epithelial / LPF 6-30 (*)    All other components within normal limits  BASIC METABOLIC PANEL  HEPATIC FUNCTION PANEL    Radiology Mr Brain Wo Contrast  Result Date: 10/12/2016 CLINICAL DATA:  Initial evaluation for acute dizziness, memory loss. EXAM: MRI HEAD WITHOUT CONTRAST TECHNIQUE: Multiplanar, multiecho pulse sequences of the brain and surrounding structures were obtained without intravenous contrast. COMPARISON:  Prior MRI from 11/02/2015. FINDINGS: Brain: Cerebral volume within normal limits. No focal parenchymal signal abnormality. No significant cerebral white matter disease. No abnormal foci of restricted diffusion to suggest acute or subacute ischemia. Gray-white matter differentiation well maintained. No encephalomalacia to suggest chronic infarction. No evidence for acute or chronic intracranial hemorrhage. No mass lesion, midline shift, or mass effect. No hydrocephalus. No extra-axial fluid collection. Postoperative changes from prior left suboccipital craniotomy again noted. Major dural sinuses are  grossly patent. Pituitary gland and suprasellar region grossly unremarkable. Vascular: Major intracranial vascular flow voids maintained. Skull and upper cervical spine: Craniocervical junction within normal limits. Visualized upper cervical  spine unremarkable. Bone marrow signal intensity within normal limits. Postoperative changes present the left suboccipital calvarium. Calvarium otherwise intact. Scalp soft tissues demonstrate no acute abnormality. Sinuses/Orbits: Globes and orbital soft tissues within normal limits. Patient is status post lens extraction on the left. Paranasal sinuses are clear. Trace opacity left mastoid air cells. Right mastoid air cells clear. Inner ear structures grossly normal. Other: None. IMPRESSION: Status post left suboccipital craniotomy. Otherwise stable negative MRI of the brain. Electronically Signed   By: Jeannine Boga M.D.   On: 10/12/2016 21:00    Procedures Procedures (including critical care time)  Medications Ordered in ED Medications  sodium chloride 0.9 % bolus 1,000 mL (1,000 mLs Intravenous New Bag/Given 10/12/16 1929)  metoCLOPramide (REGLAN) injection 10 mg (10 mg Intravenous Given 10/12/16 1919)  diphenhydrAMINE (BENADRYL) injection 25 mg (25 mg Intravenous Given 10/12/16 1920)  meclizine (ANTIVERT) tablet 25 mg (25 mg Oral Given 10/12/16 1920)  LORazepam (ATIVAN) injection 0.5 mg (0.5 mg Intravenous Given 10/12/16 2012)     Initial Impression / Assessment and Plan / ED Course  I have reviewed the triage vital signs and the nursing notes.  Pertinent labs & imaging results that were available during my care of the patient were reviewed by me and considered in my medical decision making (see chart for details).  Difficulty with directions while driving. It is possible this represents a stroke. If so, patient is well outside the window for thrombolytic therapy, interventional radiology therapy, and code stroke activation. She is being sent for MRI  scan. Will give normal saline, metoclopramide, diphenhydramine for her headache. We'll give meclizine for her dizziness. Review of old records shows an ED visit in April 2017 with dizziness with negative MRI scan and presumed peripheral vertigo.  She feels much better after above noted treatment. Dizziness is improved. MRI shows no evidence of stroke or tumor. She is referred back to her neurologist for further outpatient workup and is discharged with prescription for meclizine for vertigo.  Final Clinical Impressions(s) / ED Diagnoses   Final diagnoses:  Memory loss  Normochromic normocytic anemia  Peripheral vertigo, unspecified laterality  Headache, unspecified headache type    New Prescriptions New Prescriptions   MECLIZINE (ANTIVERT) 25 MG TABLET    Take 1 tablet (25 mg total) by mouth 3 (three) times daily as needed for dizziness.     Delora Fuel, MD 02/09/18 [id number]

## 2016-10-12 NOTE — ED Notes (Signed)
IV attempt x2 by this RN, second RN tasked.

## 2016-10-12 NOTE — Discharge Instructions (Signed)
Your evaluation did not show a cause for your memory problem. Your MRI scan did not show any sign of stroke. Please use the GPS  apps on your phone to help you navigate when you are in your car. Please follow up with your neurologist for further evaluation. Return if symptoms are getting worse.

## 2016-10-12 NOTE — Telephone Encounter (Signed)
Patient called back and stated that she was going to ED. She will call us back once discharged.

## 2016-10-12 NOTE — ED Notes (Signed)
Patient transported to MRI 

## 2016-10-13 LAB — HEPATIC FUNCTION PANEL
ALT: 66 U/L — ABNORMAL HIGH (ref 14–54)
AST: 54 U/L — ABNORMAL HIGH (ref 15–41)
Albumin: 3.4 g/dL — ABNORMAL LOW (ref 3.5–5.0)
Alkaline Phosphatase: 88 U/L (ref 38–126)
Bilirubin, Direct: <0.1 mg/dL — ABNORMAL LOW (ref 0.1–0.5)
TOTAL PROTEIN: 5.9 g/dL — AB (ref 6.5–8.1)
Total Bilirubin: 0.5 mg/dL (ref 0.3–1.2)

## 2016-11-01 ENCOUNTER — Other Ambulatory Visit: Payer: Self-pay

## 2016-11-01 MED ORDER — ALPRAZOLAM 1 MG PO TABS: 1.0000 mg | ORAL_TABLET | Freq: Two times a day (BID) | ORAL | 1 refills | Status: DC | PRN

## 2016-11-01 NOTE — Telephone Encounter (Signed)
Pt is up to date on appts and is due for a refill.

## 2016-11-01 NOTE — Telephone Encounter (Signed)
RX for xanax faxed to [facility] on [address]. Received a receipt of confirmation.

## 2016-11-08 ENCOUNTER — Ambulatory Visit: Payer: PPO | Admitting: Psychiatry

## 2016-11-15 ENCOUNTER — Ambulatory Visit: Payer: PPO | Admitting: Psychiatry

## 2016-11-17 ENCOUNTER — Other Ambulatory Visit: Payer: Self-pay | Admitting: Nurse Practitioner

## 2016-11-19 ENCOUNTER — Encounter (HOSPITAL_COMMUNITY): Payer: Self-pay

## 2016-11-19 ENCOUNTER — Emergency Department (HOSPITAL_COMMUNITY): Payer: PPO

## 2016-11-19 ENCOUNTER — Emergency Department (HOSPITAL_COMMUNITY)
Admission: EM | Admit: 2016-11-19 | Discharge: 2016-11-19 | Disposition: A | Discharge status: Home / Self Care | Payer: PPO | Attending: Emergency Medicine | Admitting: Emergency Medicine

## 2016-11-19 DIAGNOSIS — R9431 Abnormal electrocardiogram [ECG] [EKG]: Secondary | ICD-10-CM | POA: Diagnosis not present

## 2016-11-19 DIAGNOSIS — R0602 Shortness of breath: Secondary | ICD-10-CM | POA: Diagnosis not present

## 2016-11-19 DIAGNOSIS — K029 Dental caries, unspecified: Secondary | ICD-10-CM | POA: Diagnosis not present

## 2016-11-19 DIAGNOSIS — R112 Nausea with vomiting, unspecified: Secondary | ICD-10-CM | POA: Diagnosis not present

## 2016-11-19 DIAGNOSIS — K0889 Other specified disorders of teeth and supporting structures: Secondary | ICD-10-CM | POA: Diagnosis not present

## 2016-11-19 LAB — CBC WITH DIFFERENTIAL/PLATELET
BASOS ABS: 0 10*3/uL (ref 0.0–0.1)
Basophils Relative: 0 %
Eosinophils Absolute: 0 10*3/uL (ref 0.0–0.7)
Eosinophils Relative: 0 %
HEMATOCRIT: 41.1 % (ref 36.0–46.0)
HEMOGLOBIN: 13.6 g/dL (ref 12.0–15.0)
LYMPHS PCT: 21 %
Lymphs Abs: 1.4 10*3/uL (ref 0.7–4.0)
MCH: 27.6 pg (ref 26.0–34.0)
MCHC: 33.1 g/dL (ref 30.0–36.0)
MCV: 83.4 fL (ref 78.0–100.0)
MONOS PCT: 5 %
Monocytes Absolute: 0.3 10*3/uL (ref 0.1–1.0)
NEUTROS ABS: 4.9 10*3/uL (ref 1.7–7.7)
NEUTROS PCT: 74 %
PLATELETS: 308 10*3/uL (ref 150–400)
RBC: 4.93 MIL/uL (ref 3.87–5.11)
RDW: 14.6 % (ref 11.5–15.5)
WBC: 6.7 10*3/uL (ref 4.0–10.5)

## 2016-11-19 LAB — COMPREHENSIVE METABOLIC PANEL
ALT: 13 U/L — AB (ref 14–54)
AST: 17 U/L (ref 15–41)
Albumin: 4.4 g/dL (ref 3.5–5.0)
Alkaline Phosphatase: 74 U/L (ref 38–126)
Anion gap: 12 (ref 5–15)
BUN: 11 mg/dL (ref 6–20)
CHLORIDE: 104 mmol/L (ref 101–111)
CO2: 22 mmol/L (ref 22–32)
Calcium: 10.2 mg/dL (ref 8.9–10.3)
Creatinine, Ser: 0.85 mg/dL (ref 0.44–1.00)
GFR calc Af Amer: >60 mL/min (ref >60)
GLUCOSE: 96 mg/dL (ref 65–99)
POTASSIUM: 3.5 mmol/L (ref 3.5–5.1)
Sodium: 138 mmol/L (ref 135–145)
Total Bilirubin: 0.7 mg/dL (ref 0.3–1.2)
Total Protein: 7 g/dL (ref 6.5–8.1)

## 2016-11-19 LAB — URINALYSIS, ROUTINE W REFLEX MICROSCOPIC
Bilirubin Urine: NEGATIVE
Glucose, UA: NEGATIVE mg/dL
Hgb urine dipstick: NEGATIVE
KETONES UR: 20 mg/dL — AB
Leukocytes, UA: NEGATIVE
NITRITE: NEGATIVE
PROTEIN: NEGATIVE mg/dL
Specific Gravity, Urine: 1.013 (ref 1.005–1.030)
pH: 7 (ref 5.0–8.0)

## 2016-11-19 LAB — D-DIMER, QUANTITATIVE: D-Dimer, Quant: <0.27 ug/mL-FEU (ref 0.00–0.50)

## 2016-11-19 LAB — LIPASE, BLOOD: LIPASE: 31 U/L (ref 11–51)

## 2016-11-19 LAB — TROPONIN I: Troponin I: <0.03 ng/mL (ref <0.03)

## 2016-11-19 MED ORDER — PENICILLIN V POTASSIUM 500 MG PO TABS: 500.0000 mg | ORAL_TABLET | Freq: Four times a day (QID) | ORAL | 0 refills | Status: DC

## 2016-11-19 MED ORDER — ONDANSETRON 8 MG PO TBDP: 8.0000 mg | ORAL_TABLET | Freq: Three times a day (TID) | ORAL | 0 refills | Status: DC | PRN

## 2016-11-19 MED ORDER — ONDANSETRON HCL 4 MG/2ML IJ SOLN
4.0000 mg | Freq: Once | INTRAMUSCULAR | Status: AC
  Administered 2016-11-19: 4 mg via INTRAVENOUS
  Filled 2016-11-19: qty 2

## 2016-11-19 MED ORDER — MORPHINE SULFATE (PF) 4 MG/ML IV SOLN
4.0000 mg | Freq: Once | INTRAVENOUS | Status: AC
  Administered 2016-11-19: 4 mg via INTRAVENOUS
  Filled 2016-11-19: qty 1

## 2016-11-19 MED ORDER — HYDROCODONE-IBUPROFEN 5-200 MG PO TABS: 1.0000 | ORAL_TABLET | Freq: Three times a day (TID) | ORAL | 0 refills | Status: DC | PRN

## 2016-11-19 NOTE — ED Notes (Signed)
Zofran given through new IV in leeft AC r/t pt c/o pain with attempted flush in left forearm.

## 2016-11-19 NOTE — ED Provider Notes (Signed)
[facility] DEPT Provider Note   CSN: [id number] Arrival date & time: 11/19/16  1708     History   Chief Complaint Chief Complaint  Patient presents with  . Emesis  . Dental Pain    HPI TOMEIKA WEINMANN is a 66 y.o. female presenting with dental pain for 1 week and shortness of breath since this morning.  Her dental pain is located in her left lower molars. Patient visited her dentist last week who told her that the tooth needed to be extracted eventually but that it was not an emergency. No fevers. No drainage from this area. Patient has not tried taking any medications for this. The pain has worsened over the past week.  This morning but noticed a sudden onset of shortness of breath and nausea with a few episodes of emesis. She had some lower sternal chest pain associated with this while retching. She has not noticed anything that makes the shortness of breath or chest pain better or worse. She has not taken any medications for this. No diarrhea or constipation. No cough or wheezing. Patient denies history of heart or lung disease. She has never had a blood clot. No recent immobilization.   HPI  Past Medical History:  Diagnosis Date  . Accidental poisoning by aspirin 09/2014  . Anemia   . Anxiety   . Arthritis    "right knee; right shoulder" (09/15/2015)  . Ataxia 03/24/2014  . Cataract    both eyes  . Chronic lower back pain    scoliosis  . Depression   . GERD (gastroesophageal reflux disease)   . H/O hiatal hernia   . High cholesterol   . History of blood transfusion    "related to OR"  . Hypersomnia   . IBS (irritable bowel syndrome)   . Migraines    "maybe once/week now" (09/15/2015)  . Osteoporosis   . Other constipation   . Parotid tumor   . PONV (postoperative nausea and vomiting)    hx of " getting too much" - 1998, slow to take up by body   . PUD (peptic ulcer disease)   . Trigeminal neuralgia    S/P radiation therapy   . Unspecified hypothyroidism      Patient Active Problem List   Diagnosis Date Noted  . Epigastric hernia 09/15/2015  . Migraine, intractable 05/28/2015  . Migraine 05/28/2015  . Headache, migraine   . Insomnia   . Emesis   . History of hypothyroidism   . Microcytic hypochromic anemia   . Salicylate poisoning 86/[birth year]1950  . Migraine headache 08/28/2014  . Hypokalemia 08/28/2014  . Salicylate intoxication 08/28/2014  . Aspirin toxicity   . Ataxia 03/24/2014  . Complicated grief 93/26/7124  . Altered mental status, unspecified altered mental status type 10/13/2013  . Polypharmacy 10/13/2013  . Depression 10/13/2013  . Atypical face pain 02/10/2013  . Esophageal reflux 06/12/2012  . Trigeminal neuralgia 11/07/2011  . Hx of migraine headaches 10/20/2011  . Dysphagia, unspecified(787.20) 09/26/2011  . Weight gain 09/26/2011  . NAUSEA 02/02/2010  . DIARRHEA 02/02/2010  . CHANGE IN BOWELS 02/02/2010  . ULCER-DUODENAL 11/26/2008  . History of peptic ulcer 11/26/2008  . GASTRITIS 11/26/2008  . HYPOTHYROIDISM 10/03/2007  . CONSTIPATION, CHRONIC 10/03/2007  . IRRITABLE BOWEL SYNDROME 10/03/2007  . COLITIS, HX OF 10/03/2007    Past Surgical History:  Procedure Laterality Date  . ABDOMINAL HYSTERECTOMY    . ANKLE FRACTURE SURGERY Left   . ANTERIOR LUMBAR FUSION  04/2010   L4-5;  L5-S1  . BACK SURGERY    . BRAIN SURGERY     parotid tumor removed   . CHOLECYSTECTOMY OPEN    . EPIGASTRIC HERNIA REPAIR  09/15/2015   Procedure: OPEN HERNIA REPAIR EPIGASTRIC ADULT;  Surgeon: Greer Pickerel, MD;  Location: [facility];  Service: General;;  . ESOPHAGOGASTRODUODENOSCOPY  06/12/2012   Procedure: ESOPHAGOGASTRODUODENOSCOPY (EGD);  Surgeon: Lear Ng, MD;  Location: [facility];  Service: Endoscopy;  Laterality: N/A;  . FRACTURE SURGERY    . gamma knife for trig neuralgia  12/27/11  . [place]   "converted from scope to open during the OR; ligament repair"  . LAMINECTOMY  AND MICRODISCECTOMY LUMBAR SPINE  1980   L4-5  . LAPAROSCOPIC EPIGASTRIC HERNIA REPAIR  09/15/2015   open  . PAROTID GLAND TUMOR EXCISION     benign  . SHOULDER ARTHROSCOPY W/ ROTATOR CUFF REPAIR Right 11-2014  . TRIGEMINAL NERVE DECOMPRESSION     to stop migraines and it did not work     OB History    No data available       Home Medications    Prior to Admission medications   Medication Sig Start Date End Date Taking? Authorizing Provider  ALPRAZolam Duanne Moron) 1 MG tablet Take 1 tablet (1 mg total) by mouth 2 (two) times daily as needed for anxiety. 11/01/16   Marcial Pacas, MD  estradiol (ESTRACE) 1 MG tablet Take 1 mg by mouth daily.  04/21/13   Historical Provider, MD  gabapentin (NEURONTIN) 300 MG capsule TAKE 1 CAPSULE (300 MG TOTAL) BY MOUTH 3 (THREE) TIMES DAILY. 04/19/16   Dennie Bible, NP  hydrocodone-ibuprofen (VICOPROFEN) 5-200 MG tablet Take 1 tablet by mouth every 8 (eight) hours as needed for pain. 11/19/16   Vivi Barrack, MD  levothyroxine (SYNTHROID, LEVOTHROID) 75 MCG tablet Take 75 mcg by mouth daily before breakfast.     Historical Provider, MD  meclizine (ANTIVERT) 25 MG tablet Take 1 tablet (25 mg total) by mouth 3 (three) times daily as needed for dizziness. 2/42/35   Delora Fuel, MD  methadone (DOLOPHINE) 5 MG tablet Take 5 mg by mouth daily. 03/22/15   Historical Provider, MD  ondansetron (ZOFRAN ODT) 8 MG disintegrating tablet Take 1 tablet (8 mg total) by mouth every 8 (eight) hours as needed for nausea or vomiting. 11/19/16   Vivi Barrack, MD  ondansetron (ZOFRAN) 4 MG tablet Take 1 tablet (4 mg total) by mouth every 8 (eight) hours as needed for nausea or vomiting. 11/12/15   Shary Decamp, PA-C  pantoprazole (PROTONIX) 40 MG tablet Take 40 mg by mouth 2 (two) times daily as needed (ulcer).     Historical Provider, MD  penicillin v potassium (VEETID) 500 MG tablet Take 1 tablet (500 mg total) by mouth 4 (four) times daily. 11/19/16   Vivi Barrack, MD   pilocarpine (SALAGEN) 5 MG tablet TAKE 1/2-1 TABLET BY MOUTH 4 TIMES DAILY AS NEEDED FOR DRY MOUTH 02/17/15   Historical Provider, MD  spironolactone (ALDACTONE) 100 MG tablet Take 100 mg by mouth daily before breakfast.     Historical Provider, MD  SUMAtriptan (IMITREX) 100 MG tablet TAKE 1 TABLET MAY REPEAT IN 2 HOURS IF HEADACHE PERSIST OR RECURES 04/19/16   Dennie Bible, NP  topiramate (TOPAMAX) 25 MG tablet TAKE 2 TABLETS (50 MG TOTAL) BY MOUTH 2 (TWO) TIMES DAILY. 11/17/16   Larey Seat, MD  traMADol Veatrice Bourbon) 50  MG tablet Take 50-100 mg by mouth every 8 (eight) hours as needed for moderate pain or severe pain.    Historical Provider, MD  traZODone (DESYREL) 100 MG tablet Take 100-200 mg by mouth at bedtime.     Historical Provider, MD  valACYclovir (VALTREX) 500 MG tablet Take 500 mg by mouth daily.    Historical Provider, MD    Family History Family History  Problem Relation Age of Onset  . Ovarian cancer Mother   . Migraines Mother   . Diabetes Father   . Migraines Brother   . Heart disease Other   . Heart disease Maternal Grandfather     Social History Social History  Substance Use Topics  . Smoking status: Never Smoker  . Smokeless tobacco: Never Used  . Alcohol use No     Allergies   Acetaminophen   Review of Systems Review of Systems  Constitutional: Negative for chills and fever.  HENT: Positive for dental problem.   Eyes: Negative.   Respiratory: Positive for shortness of breath. Negative for cough.   Cardiovascular: Positive for chest pain.  Gastrointestinal: Positive for nausea and vomiting. Negative for constipation and diarrhea.  Endocrine: Negative.   Genitourinary: Negative.   Musculoskeletal: Negative.   Skin: Negative.   Allergic/Immunologic: Negative.   Neurological: Negative.   Hematological: Negative.   Psychiatric/Behavioral: Negative.      Physical Exam Updated Vital Signs BP 132/72   Pulse 61   Resp 12   Ht 4\' 11"  (1.499  m)   Wt 45.4 kg   SpO2 96%   BMI 20.20 kg/m   Physical Exam  Constitutional: She appears well-developed and well-nourished. No distress.  HENT:  Head: Normocephalic and atraumatic.  Mouth/Throat: Dental caries present.  No areas of fluctuance or induration noted.   Eyes: EOM are normal. Pupils are equal, round, and reactive to light.  Neck: Normal range of motion.  Cardiovascular: Normal rate, regular rhythm and normal heart sounds.   No murmur heard. Pulmonary/Chest: Effort normal. No respiratory distress. She has no wheezes.  Abdominal: Soft. Bowel sounds are normal. She exhibits no distension. There is no tenderness. There is no guarding.  Musculoskeletal: Normal range of motion.  Neurological: She is alert. No cranial nerve deficit.  Skin: Skin is warm and dry. She is not diaphoretic.  Psychiatric: She has a normal mood and affect. Her behavior is normal. Judgment and thought content normal.     ED Treatments / Results  Labs (all labs ordered are listed, but only abnormal results are displayed) Labs Reviewed  COMPREHENSIVE METABOLIC PANEL - Abnormal; Notable for the following:       Result Value   ALT 13 (*)    All other components within normal limits  URINALYSIS, ROUTINE W REFLEX MICROSCOPIC - Abnormal; Notable for the following:    Ketones, ur 20 (*)    All other components within normal limits  CBC WITH DIFFERENTIAL/PLATELET  LIPASE, BLOOD  TROPONIN I  D-DIMER, QUANTITATIVE (NOT AT [facility])  I-STAT TROPOININ, ED    EKG  EKG Interpretation  Date/Time:  Saturday November 19 2016 18:05:00 EDT Ventricular Rate:  61 PR Interval:    QRS Duration: 96 QT Interval:  386 QTC Calculation: 389 R Axis:   -41 Text Interpretation:  Sinus rhythm Left axis deviation Abnormal R-wave progression, late transition Confirmed by Reather Converse MD, JOSHUA [id number]) on 11/19/2016 7:23:51 PM       Radiology Dg Chest 2 View  Result Date: 11/19/2016 CLINICAL DATA:  Shortness  of breath EXAM:  CHEST  2 VIEW COMPARISON:  07/24/2015 FINDINGS: The lungs are clear wiithout focal pneumonia, edema, pneumothorax or pleural effusion. Interstitial markings are diffusely coarsened with chronic features. The cardiopericardial silhouette is within normal limits for size. Septal occluder device is noted. Bones are diffusely demineralized. Telemetry leads overlie the chest. IMPRESSION: Stable.  No acute findings. Electronically Signed   By: Misty Stanley M.D.   On: 11/19/2016 19:18    Procedures Procedures (including critical care time)  Medications Ordered in ED Medications  ondansetron (ZOFRAN) injection 4 mg (4 mg Intravenous Given 11/19/16 1831)  morphine 4 MG/ML injection 4 mg (4 mg Intravenous Given 11/19/16 1915)     Initial Impression / Assessment and Plan / ED Course  I have reviewed the triage vital signs and the nursing notes.  Pertinent labs & imaging results that were available during my care of the patient were reviewed by me and considered in my medical decision making (see chart for details).     Patient is a 66 year old female presenting with 1 week of dental pain and sudden onset of nausea, vomiting, shortness of breath, and chest pain since this morning. Physical exam notable for dental carries without signs of abscess. The remainder of her physical exam is normal. Will check CBC with differential, CMET, lipase, D-Dimer, troponin, EKG, and CXR.   8:32 PM Basic labs, lipase, D-dimer, troponin, EKG, and CXR negative. HEART score 2. Nausea and pain improved with zofran and morphine. Patient continues to look well. Patient with a history of PUD which may explain some of her nausea and epigastric pain, doubt cardiac or pulmonary etiology. Will discharge home with penicillin, zofran ODT, and a small supply of norco. Advised close follow up with PCP and dentistry. Return precautions reviewed.   Final Clinical Impressions(s) / ED Diagnoses   Final diagnoses:  Pain due to dental  caries   New Prescriptions New Prescriptions   HYDROCODONE-IBUPROFEN (VICOPROFEN) 5-200 MG TABLET    Take 1 tablet by mouth every 8 (eight) hours as needed for pain.   ONDANSETRON (ZOFRAN ODT) 8 MG DISINTEGRATING TABLET    Take 1 tablet (8 mg total) by mouth every 8 (eight) hours as needed for nausea or vomiting.   PENICILLIN V POTASSIUM (VEETID) 500 MG TABLET    Take 1 tablet (500 mg total) by mouth 4 (four) times daily.     Vivi Barrack, MD 11/19/16 6720    Elnora Morrison, MD 11/20/16 Dyann Kief

## 2016-11-19 NOTE — ED Notes (Signed)
Pt reports she went to the [facility] for a tooth ache on Wednesday. Pt reports it needs to be pulled later this week but they did not think it was urgent. Pt reports NV since this morning with SOB. Pain in left jaw.

## 2016-11-19 NOTE — ED Notes (Signed)
Pt. Verbalizes DC teaching. She reports family is on the way to drive her home. NAD. VSS. Encouraged to follow up with [facility].

## 2016-11-19 NOTE — ED Notes (Signed)
ED Provider at bedside. 

## 2016-11-19 NOTE — ED Notes (Signed)
Pt ambulated to RR with steady gait

## 2016-11-19 NOTE — ED Notes (Signed)
Patient transported to X-ray 

## 2016-11-19 NOTE — ED Triage Notes (Signed)
Per EMS: Pt complaining of L jaw pain d/t impacted tooth and SOB. Pt complaining of N/V x 3. EMS gave 4mg  zofran enroute. 20G LFA.

## 2016-11-24 ENCOUNTER — Other Ambulatory Visit: Payer: Self-pay | Admitting: Nurse Practitioner

## 2016-12-20 ENCOUNTER — Ambulatory Visit: Payer: PPO | Admitting: Psychiatry

## 2017-01-09 ENCOUNTER — Other Ambulatory Visit: Payer: Self-pay | Admitting: Neurology

## 2017-01-10 ENCOUNTER — Other Ambulatory Visit: Payer: Self-pay

## 2017-01-10 MED ORDER — ALPRAZOLAM 1 MG PO TABS: 1.0000 mg | ORAL_TABLET | Freq: Two times a day (BID) | ORAL | 0 refills | Status: DC | PRN

## 2017-02-28 ENCOUNTER — Other Ambulatory Visit: Payer: Self-pay | Admitting: Neurology

## 2017-03-10 DIAGNOSIS — N390 Urinary tract infection, site not specified: Secondary | ICD-10-CM | POA: Diagnosis not present

## 2017-03-10 DIAGNOSIS — F419 Anxiety disorder, unspecified: Secondary | ICD-10-CM | POA: Diagnosis not present

## 2017-03-21 ENCOUNTER — Emergency Department (HOSPITAL_COMMUNITY): Payer: PPO

## 2017-03-21 ENCOUNTER — Emergency Department (HOSPITAL_COMMUNITY)
Admission: EM | Admit: 2017-03-21 | Discharge: 2017-03-22 | Disposition: A | Discharge status: Home / Self Care | Payer: PPO | Attending: Emergency Medicine | Admitting: Emergency Medicine

## 2017-03-21 ENCOUNTER — Encounter (HOSPITAL_COMMUNITY): Payer: Self-pay

## 2017-03-21 DIAGNOSIS — F419 Anxiety disorder, unspecified: Secondary | ICD-10-CM | POA: Diagnosis not present

## 2017-03-21 DIAGNOSIS — Z79899 Other long term (current) drug therapy: Secondary | ICD-10-CM | POA: Diagnosis not present

## 2017-03-21 DIAGNOSIS — R0789 Other chest pain: Secondary | ICD-10-CM | POA: Diagnosis not present

## 2017-03-21 DIAGNOSIS — E039 Hypothyroidism, unspecified: Secondary | ICD-10-CM | POA: Insufficient documentation

## 2017-03-21 DIAGNOSIS — R0602 Shortness of breath: Secondary | ICD-10-CM | POA: Diagnosis present

## 2017-03-21 DIAGNOSIS — R079 Chest pain, unspecified: Secondary | ICD-10-CM | POA: Diagnosis not present

## 2017-03-21 DIAGNOSIS — R05 Cough: Secondary | ICD-10-CM | POA: Diagnosis not present

## 2017-03-21 DIAGNOSIS — R069 Unspecified abnormalities of breathing: Secondary | ICD-10-CM | POA: Diagnosis not present

## 2017-03-21 LAB — BASIC METABOLIC PANEL
ANION GAP: 8 (ref 5–15)
BUN: 17 mg/dL (ref 6–20)
CALCIUM: 9.7 mg/dL (ref 8.9–10.3)
CO2: 23 mmol/L (ref 22–32)
CREATININE: 0.91 mg/dL (ref 0.44–1.00)
Chloride: 108 mmol/L (ref 101–111)
GFR calc Af Amer: >60 mL/min (ref >60)
GLUCOSE: 97 mg/dL (ref 65–99)
Potassium: 4.4 mmol/L (ref 3.5–5.1)
Sodium: 139 mmol/L (ref 135–145)

## 2017-03-21 LAB — CBC
HCT: 40.2 % (ref 36.0–46.0)
HEMOGLOBIN: 13.1 g/dL (ref 12.0–15.0)
MCH: 28.2 pg (ref 26.0–34.0)
MCHC: 32.6 g/dL (ref 30.0–36.0)
MCV: 86.5 fL (ref 78.0–100.0)
PLATELETS: 296 10*3/uL (ref 150–400)
RBC: 4.65 MIL/uL (ref 3.87–5.11)
RDW: 13.5 % (ref 11.5–15.5)
WBC: 7.1 10*3/uL (ref 4.0–10.5)

## 2017-03-21 LAB — I-STAT TROPONIN, ED: TROPONIN I, POC: 0 ng/mL (ref 0.00–0.08)

## 2017-03-21 NOTE — ED Notes (Signed)
Pt sts she is too panicky and hasn't slept in a couple days.  Deep breathing causes coughing.

## 2017-03-22 LAB — D-DIMER, QUANTITATIVE (NOT AT ARMC)

## 2017-03-22 LAB — TROPONIN I: Troponin I: <0.03 ng/mL (ref <0.03)

## 2017-03-22 MED ORDER — MORPHINE SULFATE (PF) 4 MG/ML IV SOLN
4.0000 mg | Freq: Once | INTRAVENOUS | Status: AC
  Administered 2017-03-22: 4 mg via INTRAVENOUS
  Filled 2017-03-22: qty 1

## 2017-03-22 MED ORDER — ONDANSETRON HCL 4 MG/2ML IJ SOLN
4.0000 mg | Freq: Once | INTRAMUSCULAR | Status: AC
  Administered 2017-03-22: 4 mg via INTRAVENOUS
  Filled 2017-03-22: qty 2

## 2017-03-22 NOTE — ED Notes (Signed)
Pt still feels very sleepy from the morphine. Spoke with charge who advised to observe pt in the hallway until she felt more alert.  This plan was cleared with patient.

## 2017-03-22 NOTE — Discharge Instructions (Signed)
You were seen today for chest pain. Your workup is largely reassuring. Follow-up with cardiology for formal outpatient evaluation.  If you develop any new or worsening symptoms she should be reevaluated.

## 2017-03-22 NOTE — ED Provider Notes (Signed)
[facility] DEPT Provider Note   CSN: [id number] Arrival date & time: 03/21/17  1704     History   Chief Complaint Chief Complaint  Patient presents with  . Shortness of Breath  . Chest Pain  . Anxiety    HPI Caitlin Irwin is a 66 y.o. female.  HPI  This is a 66 year old female who presents with chest pain and shortness of breath. Patient reports 2-3 day history of constant anterior pressure-like chest pain. It is worse with breathing. She states that she has not been able to sleep. She has not taken anything for her pain. Current pain is 8 out of 10. She also reports shortness of breath which is worse with talking. No specific exertional component. She denies history of hypertension, hyperlipidemia, diabetes, smoking. She denies any fevers or cough. No recent long travel, surgeries, or history of blood clots.  Past Medical History:  Diagnosis Date  . Accidental poisoning by aspirin 09/2014  . Anemia   . Anxiety   . Arthritis    "right knee; right shoulder" (09/15/2015)  . Ataxia 03/24/2014  . Cataract    both eyes  . Chronic lower back pain    scoliosis  . Depression   . GERD (gastroesophageal reflux disease)   . H/O hiatal hernia   . High cholesterol   . History of blood transfusion    "related to OR"  . Hypersomnia   . IBS (irritable bowel syndrome)   . Migraines    "maybe once/week now" (09/15/2015)  . Osteoporosis   . Other constipation   . Parotid tumor   . PONV (postoperative nausea and vomiting)    hx of " getting too much" - 1998, slow to take up by body   . PUD (peptic ulcer disease)   . Trigeminal neuralgia    S/P radiation therapy   . Unspecified hypothyroidism     Patient Active Problem List   Diagnosis Date Noted  . Epigastric hernia 09/15/2015  . Migraine, intractable 05/28/2015  . Migraine 05/28/2015  . Headache, migraine   . Insomnia   . Emesis   . History of hypothyroidism   . Microcytic hypochromic anemia   . Salicylate poisoning  41/96/2229  . Migraine headache 08/28/2014  . Hypokalemia 08/28/2014  . Salicylate intoxication 08/28/2014  . Aspirin toxicity   . Ataxia 03/24/2014  . Complicated grief 79/89/2119  . Altered mental status, unspecified altered mental status type 10/13/2013  . Polypharmacy 10/13/2013  . Depression 10/13/2013  . Atypical face pain 02/10/2013  . Esophageal reflux 06/12/2012  . Trigeminal neuralgia 11/07/2011  . Hx of migraine headaches 10/20/2011  . Dysphagia, unspecified(787.20) 09/26/2011  . Weight gain 09/26/2011  . NAUSEA 02/02/2010  . DIARRHEA 02/02/2010  . CHANGE IN BOWELS 02/02/2010  . ULCER-DUODENAL 11/26/2008  . History of peptic ulcer 11/26/2008  . GASTRITIS 11/26/2008  . HYPOTHYROIDISM 10/03/2007  . CONSTIPATION, CHRONIC 10/03/2007  . IRRITABLE BOWEL SYNDROME 10/03/2007  . COLITIS, HX OF 10/03/2007    Past Surgical History:  Procedure Laterality Date  . ABDOMINAL HYSTERECTOMY    . ANKLE FRACTURE SURGERY Left   . ANTERIOR LUMBAR FUSION  04/2010   L4-5; L5-S1  . BACK SURGERY    . BRAIN SURGERY     parotid tumor removed   . CHOLECYSTECTOMY OPEN    . EPIGASTRIC HERNIA REPAIR  09/15/2015   Procedure: OPEN HERNIA REPAIR EPIGASTRIC ADULT;  Surgeon: Greer Pickerel, MD;  Location: [facility];  Service: General;;  . ESOPHAGOGASTRODUODENOSCOPY  06/12/2012  Procedure: ESOPHAGOGASTRODUODENOSCOPY (EGD);  Surgeon: Lear Ng, MD;  Location: [facility];  Service: Endoscopy;  Laterality: N/A;  . FRACTURE SURGERY    . gamma knife for trig neuralgia  12/27/11  . [place]   "converted from scope to open during the OR; ligament repair"  . LAMINECTOMY AND MICRODISCECTOMY LUMBAR SPINE  1980   L4-5  . LAPAROSCOPIC EPIGASTRIC HERNIA REPAIR  09/15/2015   open  . PAROTID GLAND TUMOR EXCISION     benign  . SHOULDER ARTHROSCOPY W/ ROTATOR CUFF REPAIR Right 11-2014  . TRIGEMINAL NERVE DECOMPRESSION     to stop migraines and it did not work       OB History    No data available       Home Medications    Prior to Admission medications   Medication Sig Start Date End Date Taking? Authorizing Provider  ALPRAZolam (XANAX) 1 MG tablet TAKE 1 TABLET BY MOUTH TWICE A DAY AS NEEDED FOR ANXIETY 03/01/17   Dohmeier, Asencion Partridge, MD  estradiol (ESTRACE) 1 MG tablet Take 1 mg by mouth daily.  04/21/13   [provider]  gabapentin (NEURONTIN) 300 MG capsule TAKE 1 CAPSULE (300 MG TOTAL) BY MOUTH 3 (THREE) TIMES DAILY. 04/19/16   Dennie Bible, NP  hydrocodone-ibuprofen (VICOPROFEN) 5-200 MG tablet Take 1 tablet by mouth every 8 (eight) hours as needed for pain. 11/19/16   Vivi Barrack, MD  levothyroxine (SYNTHROID, LEVOTHROID) 75 MCG tablet Take 75 mcg by mouth daily before breakfast.     [provider]  meclizine (ANTIVERT) 25 MG tablet Take 1 tablet (25 mg total) by mouth 3 (three) times daily as needed for dizziness. 01/28/51   Delora Fuel, MD  methadone (DOLOPHINE) 5 MG tablet Take 5 mg by mouth daily. 03/22/15   [provider]  ondansetron (ZOFRAN ODT) 8 MG disintegrating tablet Take 1 tablet (8 mg total) by mouth every 8 (eight) hours as needed for nausea or vomiting. 11/19/16   Vivi Barrack, MD  ondansetron (ZOFRAN) 4 MG tablet Take 1 tablet (4 mg total) by mouth every 8 (eight) hours as needed for nausea or vomiting. 11/12/15   Shary Decamp, PA-C  pantoprazole (PROTONIX) 40 MG tablet Take 40 mg by mouth 2 (two) times daily as needed (ulcer).     [provider]  penicillin v potassium (VEETID) 500 MG tablet Take 1 tablet (500 mg total) by mouth 4 (four) times daily. 11/19/16   Vivi Barrack, MD  pilocarpine (SALAGEN) 5 MG tablet TAKE 1/2-1 TABLET BY MOUTH 4 TIMES DAILY AS NEEDED FOR DRY MOUTH 02/17/15   [provider]  spironolactone (ALDACTONE) 100 MG tablet Take 100 mg by mouth daily before breakfast.     [provider]  SUMAtriptan (IMITREX) 100 MG tablet TAKE 1 TABLET MAY  REPEAT IN 2 HOURS IF HEADACHE PERSIST OR RECURES 04/19/16   Dennie Bible, NP  topiramate (TOPAMAX) 25 MG tablet TAKE 2 TABLETS (50 MG TOTAL) BY MOUTH 2 (TWO) TIMES DAILY. 11/17/16   Dohmeier, Asencion Partridge, MD  traMADol (ULTRAM) 50 MG tablet Take 50-100 mg by mouth every 8 (eight) hours as needed for moderate pain or severe pain.    [provider]  traZODone (DESYREL) 100 MG tablet Take 100-200 mg by mouth at bedtime.     [provider]  valACYclovir (VALTREX) 500 MG tablet Take 500 mg by mouth daily.    [provider]    Family History Family History  Problem Relation Age of Onset  . Ovarian cancer Mother   . Migraines Mother   . Diabetes Father   . Migraines Brother   . Heart disease Other   . Heart disease Maternal Grandfather     Social History Social History  Substance Use Topics  . Smoking status: Never Smoker  . Smokeless tobacco: Never Used  . Alcohol use No     Allergies   Acetaminophen   Review of Systems Review of Systems  Constitutional: Negative for fever.  Respiratory: Positive for shortness of breath. Negative for cough.   Cardiovascular: Positive for chest pain. Negative for leg swelling.  Gastrointestinal: Negative for abdominal pain, nausea and vomiting.  Genitourinary: Negative for dysuria.  All other systems reviewed and are negative.    Physical Exam Updated Vital Signs BP (!) 113/57   Pulse 60   Temp 98.9 F (37.2 C) (Oral)   Resp 14   Ht 4\' 11"  (1.499 m)   Wt 45.4 kg (100 lb)   SpO2 95%   BMI 20.20 kg/m   Physical Exam  Constitutional: She is oriented to person, place, and time. She appears well-developed and well-nourished.  Anxious appearing, no acute distress  HENT:  Head: Normocephalic and atraumatic.  Cardiovascular: Normal rate, regular rhythm and normal heart sounds.   Pulmonary/Chest: Effort normal and breath sounds normal. No respiratory distress. She has no wheezes. She exhibits no tenderness.    Abdominal: Soft. Bowel sounds are normal. There is no tenderness. There is no guarding.  Musculoskeletal:  No tenderness to palpation of the calves, no obvious asymmetric swelling  Neurological: She is alert and oriented to person, place, and time.  Skin: Skin is warm and dry.  Psychiatric: She has a normal mood and affect.  Nursing note and vitals reviewed.    ED Treatments / Results  Labs (all labs ordered are listed, but only abnormal results are displayed) Labs Reviewed  BASIC METABOLIC PANEL  CBC  D-DIMER, QUANTITATIVE (NOT AT [facility])  TROPONIN I  I-STAT TROPONIN, ED    EKG  EKG Interpretation  Date/Time:  Tuesday March 21 2017 17:33:16 EDT Ventricular Rate:  72 PR Interval:  146 QRS Duration: 94 QT Interval:  360 QTC Calculation: 394 R Axis:   -42 Text Interpretation:  Normal sinus rhythm Left axis deviation Minimal voltage criteria for LVH, may be normal variant Abnormal ECG Confirmed by Thayer Jew [phone]) on 03/22/2017 12:01:55 AM       Radiology Dg Chest 2 View  Result Date: 03/21/2017 CLINICAL DATA:  Nonproductive cough and central chest pain and dyspnea starting today with malaise x3 days. EXAM: CHEST  2 VIEW COMPARISON:  11/19/2016 FINDINGS: The cardiac silhouette is within normal limits for size. A septal occlusion device is again seen projecting over the cardiac silhouette. There is mild uncoiling of the thoracic aorta with minimal aortic atherosclerosis. No aneurysm is noted of the lungs are clear without effusion or pneumothorax. No pneumonic consolidations are present. Dextroconvex curvature of the dorsal spine is noted at the thoracolumbar junction. Partially included lumbar fixation hardware is noted along the lower lumbar spine. IMPRESSION: No active cardiopulmonary disease.  Minimal aortic atherosclerosis. Electronically Signed   By: Ashley Royalty M.D.   On: 03/21/2017 18:01    Procedures Procedures (including critical care time)  Medications  Ordered in ED Medications  morphine 4 MG/ML injection 4 mg (4 mg Intravenous Given 03/22/17 0110)  ondansetron (ZOFRAN) injection 4 mg (  4 mg Intravenous Given 03/22/17 0106)     Initial Impression / Assessment and Plan / ED Course  I have reviewed the triage vital signs and the nursing notes.  Pertinent labs & imaging results that were available during my care of the patient were reviewed by me and considered in my medical decision making (see chart for details).     Patient presents with chest pain ongoing for the last 2-3 days. She is nontoxic on exam. Vital signs are reassuring. She is low risk ACS and EKG is nonischemic. Initial troponin is negative. She is also low risk for PE. D-dimer sent and negative. Repeat troponin negative. Patient was given pain and nausea medication. On recheck, she states she is much improved. She is not hypoxic. She has no wheezing on exam. No recent illnesses or viral symptoms to suggest pericarditis, myocarditis picture. Given improvement and negative workup, feel patient can follow-up as an outpatient with cardiology for formal ischemic evaluation.  After history, exam, and medical workup I feel the patient has been appropriately medically screened and is safe for discharge home. Pertinent diagnoses were discussed with the patient. Patient was given return precautions.   Final Clinical Impressions(s) / ED Diagnoses   Final diagnoses:  Atypical chest pain    New Prescriptions New Prescriptions   No medications on file     Merryl Hacker, MD 03/22/17 [phone]

## 2017-03-30 ENCOUNTER — Other Ambulatory Visit: Payer: Self-pay

## 2017-03-30 ENCOUNTER — Emergency Department (HOSPITAL_COMMUNITY)
Admission: EM | Admit: 2017-03-30 | Discharge: 2017-03-31 | Disposition: A | Discharge status: Home / Self Care | Payer: PPO | Attending: Emergency Medicine | Admitting: Emergency Medicine

## 2017-03-30 ENCOUNTER — Encounter (HOSPITAL_COMMUNITY): Payer: Self-pay

## 2017-03-30 ENCOUNTER — Emergency Department (HOSPITAL_COMMUNITY): Payer: PPO

## 2017-03-30 ENCOUNTER — Other Ambulatory Visit: Payer: Self-pay | Admitting: Neurology

## 2017-03-30 DIAGNOSIS — E039 Hypothyroidism, unspecified: Secondary | ICD-10-CM | POA: Insufficient documentation

## 2017-03-30 DIAGNOSIS — R0789 Other chest pain: Secondary | ICD-10-CM | POA: Diagnosis not present

## 2017-03-30 DIAGNOSIS — R079 Chest pain, unspecified: Secondary | ICD-10-CM | POA: Diagnosis not present

## 2017-03-30 DIAGNOSIS — G43009 Migraine without aura, not intractable, without status migrainosus: Secondary | ICD-10-CM

## 2017-03-30 DIAGNOSIS — Z9049 Acquired absence of other specified parts of digestive tract: Secondary | ICD-10-CM | POA: Diagnosis not present

## 2017-03-30 DIAGNOSIS — F419 Anxiety disorder, unspecified: Secondary | ICD-10-CM | POA: Diagnosis not present

## 2017-03-30 DIAGNOSIS — G43909 Migraine, unspecified, not intractable, without status migrainosus: Secondary | ICD-10-CM | POA: Diagnosis not present

## 2017-03-30 DIAGNOSIS — Z79899 Other long term (current) drug therapy: Secondary | ICD-10-CM | POA: Diagnosis not present

## 2017-03-30 DIAGNOSIS — R51 Headache: Secondary | ICD-10-CM | POA: Diagnosis not present

## 2017-03-30 DIAGNOSIS — F329 Major depressive disorder, single episode, unspecified: Secondary | ICD-10-CM | POA: Insufficient documentation

## 2017-03-30 DIAGNOSIS — R05 Cough: Secondary | ICD-10-CM | POA: Diagnosis not present

## 2017-03-30 MED ORDER — KETOROLAC TROMETHAMINE 30 MG/ML IJ SOLN
15.0000 mg | Freq: Once | INTRAMUSCULAR | Status: AC
  Administered 2017-03-31: 15 mg via INTRAVENOUS
  Filled 2017-03-30: qty 1

## 2017-03-30 MED ORDER — METOCLOPRAMIDE HCL 5 MG/ML IJ SOLN
10.0000 mg | Freq: Once | INTRAMUSCULAR | Status: AC
  Administered 2017-03-31: 10 mg via INTRAVENOUS
  Filled 2017-03-30: qty 2

## 2017-03-30 MED ORDER — DIPHENHYDRAMINE HCL 50 MG/ML IJ SOLN
25.0000 mg | Freq: Once | INTRAMUSCULAR | Status: AC
  Administered 2017-03-31: 25 mg via INTRAVENOUS
  Filled 2017-03-30: qty 1

## 2017-03-30 NOTE — ED Triage Notes (Signed)
Pt arrived via [facility] c/o migraine x3 hours, and nonproductive cough all day.  Took home migraine medications without relief.  EMS gave 324 ASA, 4mg  Zofran.

## 2017-03-30 NOTE — ED Provider Notes (Signed)
[facility] DEPT Provider Note   CSN: [id number] Arrival date & time: 03/30/17  2301     History   Chief Complaint Chief Complaint  Patient presents with  . Migraine  . Cough    HPI Caitlin Irwin is a 66 y.o. female.  Patient presents with sudden onset of posterior headache similar to her previous migraines. Headache onset around 8 PM. It is constant. The Imitrex without relief and vomited this up. She denies any photophobia or phonophobia. Denies any fever. No difficulty breathing or swallowing. No focal weakness, numbness or tingling. No difficulty speaking or swallowing. The headache she says is sudden in onset but feels just like her previous migraines. There are no visual changes. Patient also complains of central chest pain and pressure that is worse with breathing. This onset about 1 hour after her headache onset has been constant for the past 2 hours. Does not radiate to her back, neck or arm. No shortness of breath. One episode of vomiting. No sweating or syncope. No cardiac history. Patient seen in the ED on August 21 with similar chest pain that was attributed to anxiety.   The history is provided by the EMS personnel and the patient.  Migraine  Associated symptoms include headaches and shortness of breath. Pertinent negatives include no chest pain and no abdominal pain.  Cough  Associated symptoms include headaches and shortness of breath. Pertinent negatives include no chest pain, no rhinorrhea and no myalgias.    Past Medical History:  Diagnosis Date  . Accidental poisoning by aspirin 09/2014  . Anemia   . Anxiety   . Arthritis    "right knee; right shoulder" (09/15/2015)  . Ataxia 03/24/2014  . Cataract    both eyes  . Chronic lower back pain    scoliosis  . Depression   . GERD (gastroesophageal reflux disease)   . H/O hiatal hernia   . High cholesterol   . History of blood transfusion    "related to OR"  . Hypersomnia   . IBS (irritable bowel  syndrome)   . Migraines    "maybe once/week now" (09/15/2015)  . Osteoporosis   . Other constipation   . Parotid tumor   . PONV (postoperative nausea and vomiting)    hx of " getting too much" - 1998, slow to take up by body   . PUD (peptic ulcer disease)   . Trigeminal neuralgia    S/P radiation therapy   . Unspecified hypothyroidism     Patient Active Problem List   Diagnosis Date Noted  . Epigastric hernia 09/15/2015  . Migraine, intractable 05/28/2015  . Migraine 05/28/2015  . Headache, migraine   . Insomnia   . Emesis   . History of hypothyroidism   . Microcytic hypochromic anemia   . Salicylate poisoning 09/73/5329  . Migraine headache 08/28/2014  . Hypokalemia 08/28/2014  . Salicylate intoxication 08/28/2014  . Aspirin toxicity   . Ataxia 03/24/2014  . Complicated grief 92/42/6834  . Altered mental status, unspecified altered mental status type 10/13/2013  . Polypharmacy 10/13/2013  . Depression 10/13/2013  . Atypical face pain 02/10/2013  . Esophageal reflux 06/12/2012  . Trigeminal neuralgia 11/07/2011  . Hx of migraine headaches 10/20/2011  . Dysphagia, unspecified(787.20) 09/26/2011  . Weight gain 09/26/2011  . NAUSEA 02/02/2010  . DIARRHEA 02/02/2010  . CHANGE IN BOWELS 02/02/2010  . ULCER-DUODENAL 11/26/2008  . History of peptic ulcer 11/26/2008  . GASTRITIS 11/26/2008  . HYPOTHYROIDISM 10/03/2007  . CONSTIPATION, CHRONIC 10/03/2007  .  IRRITABLE BOWEL SYNDROME 10/03/2007  . COLITIS, HX OF 10/03/2007    Past Surgical History:  Procedure Laterality Date  . ABDOMINAL HYSTERECTOMY    . ANKLE FRACTURE SURGERY Left   . ANTERIOR LUMBAR FUSION  04/2010   L4-5; L5-S1  . BACK SURGERY    . BRAIN SURGERY     parotid tumor removed   . CHOLECYSTECTOMY OPEN    . EPIGASTRIC HERNIA REPAIR  09/15/2015   Procedure: OPEN HERNIA REPAIR EPIGASTRIC ADULT;  Surgeon: Greer Pickerel, MD;  Location: [facility];  Service: General;;  . ESOPHAGOGASTRODUODENOSCOPY  06/12/2012    Procedure: ESOPHAGOGASTRODUODENOSCOPY (EGD);  Surgeon: Lear Ng, MD;  Location: [facility];  Service: Endoscopy;  Laterality: N/A;  . FRACTURE SURGERY    . gamma knife for trig neuralgia  12/27/11  . [place]   "converted from scope to open during the OR; ligament repair"  . LAMINECTOMY AND MICRODISCECTOMY LUMBAR SPINE  1980   L4-5  . LAPAROSCOPIC EPIGASTRIC HERNIA REPAIR  09/15/2015   open  . PAROTID GLAND TUMOR EXCISION     benign  . SHOULDER ARTHROSCOPY W/ ROTATOR CUFF REPAIR Right 11-2014  . TRIGEMINAL NERVE DECOMPRESSION     to stop migraines and it did not work     OB History    No data available       Home Medications    Prior to Admission medications   Medication Sig Start Date End Date Taking? Authorizing Provider  ALPRAZolam (XANAX) 1 MG tablet TAKE 1 TABLET BY MOUTH TWICE A DAY AS NEEDED FOR ANXIETY 03/30/17   Dohmeier, Asencion Partridge, MD  estradiol (ESTRACE) 1 MG tablet Take 1 mg by mouth daily.  04/21/13   [provider]  gabapentin (NEURONTIN) 300 MG capsule TAKE 1 CAPSULE (300 MG TOTAL) BY MOUTH 3 (THREE) TIMES DAILY. 04/19/16   Dennie Bible, NP  hydrocodone-ibuprofen (VICOPROFEN) 5-200 MG tablet Take 1 tablet by mouth every 8 (eight) hours as needed for pain. 11/19/16   Vivi Barrack, MD  levothyroxine (SYNTHROID, LEVOTHROID) 75 MCG tablet Take 75 mcg by mouth daily before breakfast.     [provider]  meclizine (ANTIVERT) 25 MG tablet Take 1 tablet (25 mg total) by mouth 3 (three) times daily as needed for dizziness. 0/45/40   Delora Fuel, MD  methadone (DOLOPHINE) 5 MG tablet Take 5 mg by mouth daily. 03/22/15   [provider]  ondansetron (ZOFRAN ODT) 8 MG disintegrating tablet Take 1 tablet (8 mg total) by mouth every 8 (eight) hours as needed for nausea or vomiting. 11/19/16   Vivi Barrack, MD  ondansetron (ZOFRAN) 4 MG tablet Take 1 tablet (4 mg total) by mouth every 8  (eight) hours as needed for nausea or vomiting. 11/12/15   Shary Decamp, PA-C  pantoprazole (PROTONIX) 40 MG tablet Take 40 mg by mouth 2 (two) times daily as needed (ulcer).     [provider]  penicillin v potassium (VEETID) 500 MG tablet Take 1 tablet (500 mg total) by mouth 4 (four) times daily. 11/19/16   Vivi Barrack, MD  pilocarpine (SALAGEN) 5 MG tablet TAKE 1/2-1 TABLET BY MOUTH 4 TIMES DAILY AS NEEDED FOR DRY MOUTH 02/17/15   [provider]  spironolactone (ALDACTONE) 100 MG tablet Take 100 mg by mouth daily before breakfast.     [provider]  SUMAtriptan (IMITREX) 100 MG tablet TAKE 1 TABLET MAY REPEAT IN 2 HOURS IF  HEADACHE PERSIST OR RECURES 04/19/16   Dennie Bible, NP  topiramate (TOPAMAX) 25 MG tablet TAKE 2 TABLETS (50 MG TOTAL) BY MOUTH 2 (TWO) TIMES DAILY. 11/17/16   Dohmeier, Asencion Partridge, MD  traMADol (ULTRAM) 50 MG tablet Take 50-100 mg by mouth every 8 (eight) hours as needed for moderate pain or severe pain.    [provider]  traZODone (DESYREL) 100 MG tablet Take 100-200 mg by mouth at bedtime.     [provider]  valACYclovir (VALTREX) 500 MG tablet Take 500 mg by mouth daily.    [provider]    Family History Family History  Problem Relation Age of Onset  . Ovarian cancer Mother   . Migraines Mother   . Diabetes Father   . Migraines Brother   . Heart disease Other   . Heart disease Maternal Grandfather     Social History Social History  Substance Use Topics  . Smoking status: Never Smoker  . Smokeless tobacco: Never Used  . Alcohol use No     Allergies   Acetaminophen   Review of Systems Review of Systems  Constitutional: Negative for activity change, appetite change and fever.  HENT: Negative for congestion and rhinorrhea.   Eyes: Negative for photophobia and visual disturbance.  Respiratory: Positive for cough, chest tightness and shortness of breath.   Cardiovascular: Negative for  chest pain and palpitations.  Gastrointestinal: Negative for abdominal pain, nausea and vomiting.  Genitourinary: Negative for dysuria, hematuria, vaginal bleeding and vaginal discharge.  Musculoskeletal: Negative for arthralgias, back pain and myalgias.  Skin: Negative for rash.  Neurological: Positive for headaches. Negative for dizziness, seizures, speech difficulty, weakness and light-headedness.   all other systems are negative except as noted in the HPI and PMH.     Physical Exam Updated Vital Signs BP (!) 150/82   Pulse 76   Resp 17   Ht 4\' 11"  (1.499 m)   Wt 45.4 kg (100 lb)   SpO2 99%   BMI 20.20 kg/m   Physical Exam  Constitutional: She is oriented to person, place, and time. She appears well-developed and well-nourished. No distress.  HENT:  Head: Normocephalic and atraumatic.  Mouth/Throat: Oropharynx is clear and moist. No oropharyngeal exudate.  Eyes: Pupils are equal, round, and reactive to light. Conjunctivae and EOM are normal.  Neck: Normal range of motion. Neck supple. No thyromegaly present.  No meningismus.  Cardiovascular: Normal rate, regular rhythm, normal heart sounds and intact distal pulses.   No murmur heard. Pulmonary/Chest: Effort normal and breath sounds normal. No respiratory distress. She exhibits no tenderness.  Abdominal: Soft. There is no tenderness. There is no rebound and no guarding. No hernia.  Musculoskeletal: Normal range of motion. She exhibits no edema or tenderness.  Lymphadenopathy:    She has no cervical adenopathy.  Neurological: She is alert and oriented to person, place, and time. No cranial nerve deficit. She exhibits normal muscle tone. Coordination normal.  CN 2-12 intact, no ataxia on finger to nose, no nystagmus, 5/5 strength throughout, no pronator drift, Romberg negative, normal gait.   Skin: Skin is warm. Capillary refill takes less than 2 seconds.  Psychiatric: She has a normal mood and affect. Her behavior is normal.    Nursing note and vitals reviewed.    ED Treatments / Results  Labs (all labs ordered are listed, but only abnormal results are displayed) Labs Reviewed  CBC WITH DIFFERENTIAL/PLATELET - Abnormal; Notable for the following:  Result Value   Neutro Abs 8.0 (*)    All other components within normal limits  COMPREHENSIVE METABOLIC PANEL - Abnormal; Notable for the following:    Glucose, Bld 106 (*)    ALT 12 (*)    All other components within normal limits  LIPASE, BLOOD  TROPONIN I  D-DIMER, QUANTITATIVE (NOT AT [facility])  I-STAT TROPONIN, ED    EKG  EKG Interpretation  Date/Time:  Thursday March 30 2017 23:13:40 EDT Ventricular Rate:  79 PR Interval:    QRS Duration: 94 QT Interval:  390 QTC Calculation: 448 R Axis:   -45 Text Interpretation:  Sinus rhythm Left anterior fascicular block Abnormal R-wave progression, early transition Poor R wave progression Confirmed by Ezequiel Essex [phone]) on 03/30/2017 11:25:17 PM       Radiology Dg Chest 2 View  Result Date: 03/31/2017 CLINICAL DATA:  Nonproductive cough today, migraine for 3 hours. EXAM: CHEST  2 VIEW COMPARISON:  Chest radiograph March 21, 2017 FINDINGS: Cardiac silhouette is normal in size, mediastinal silhouette is unremarkable. Septal occlusion device in situ. No pleural effusion or focal consolidation. No pneumothorax. Soft tissue planes and included osseous structure nonsuspicious. S-type scoliosis. Surgical clips in the included right abdomen compatible with cholecystectomy. IMPRESSION: Stable examination:  No acute cardiopulmonary process. Electronically Signed   By: Elon Alas M.D.   On: 03/31/2017 00:29   Ct Head Wo Contrast  Result Date: 03/30/2017 CLINICAL DATA:  Severe posterior headache EXAM: CT HEAD WITHOUT CONTRAST TECHNIQUE: Contiguous axial images were obtained from the base of the skull through the vertex without intravenous contrast. COMPARISON:  MRI 10/12/2016, CT brain 08/27/2014  FINDINGS: Brain: No acute territorial infarction, hemorrhage or intracranial mass is seen. Stable left suboccipital postsurgical changes. Mild atrophy. Stable ventricle size. Vascular: No hyperdense vessels. Scattered calcifications at the carotid siphons. Skull: Left suboccipital craniectomy changes. Hyperdense material at the surgical site is unchanged. No suspicious lesion Sinuses/Orbits: No acute finding. Other: None IMPRESSION: 1. No CT evidence for acute intracranial abnormality 2. Stable left suboccipital postsurgical changes. Electronically Signed   By: Donavan Foil M.D.   On: 03/30/2017 23:56    Procedures Procedures (including critical care time)  Medications Ordered in ED Medications  metoCLOPramide (REGLAN) injection 10 mg (not administered)  diphenhydrAMINE (BENADRYL) injection 25 mg (not administered)  ketorolac (TORADOL) 30 MG/ML injection 15 mg (not administered)     Initial Impression / Assessment and Plan / ED Course  I have reviewed the triage vital signs and the nursing notes.  Pertinent labs & imaging results that were available during my care of the patient were reviewed by me and considered in my medical decision making (see chart for details).     Patient with sudden onset headache similar to previous migraines ongoing since about 8 PM. Developed chest pain and cough about 2 hours ago.  Patient has no neurological deficits. EKG is unchanged from previous. Low suspicion for ACS. Headache similar to previous migraines but is sudden in onset. CT obtained within 6 hours of headache onset is negative. This rules out subarachnoid hemorrhage. She is neurologically intact.  Labs reassuring. Troponin and d-dimer negative. Chest x-ray negative. Low suspicion for ACS or pulmonary embolism. CT head is negative.  Patient anxious appearing. Reassured that testing is reassuring. She has Xanax prescribed to her but is not taking it. We'll give small dose of Ativan. Plan to obtain  second troponin  Headache has improved. Troponin negative x2.  Headache resolved. Patient is tolerating PO and  ambulatory.  Follow up with PCP. Return precautions discussed. Follow up with cardiology as encouraged previously.   Final Clinical Impressions(s) / ED Diagnoses   Final diagnoses:  Migraine without aura and without status migrainosus, not intractable  Atypical chest pain    New Prescriptions New Prescriptions   No medications on file     Ezequiel Essex, MD 03/31/17 1028

## 2017-03-30 NOTE — ED Notes (Signed)
Pt taken to Radiology.  

## 2017-03-31 DIAGNOSIS — R05 Cough: Secondary | ICD-10-CM | POA: Diagnosis not present

## 2017-03-31 LAB — COMPREHENSIVE METABOLIC PANEL
ALT: 12 U/L — ABNORMAL LOW (ref 14–54)
ANION GAP: 9 (ref 5–15)
AST: 19 U/L (ref 15–41)
Albumin: 4.2 g/dL (ref 3.5–5.0)
Alkaline Phosphatase: 77 U/L (ref 38–126)
BILIRUBIN TOTAL: 0.8 mg/dL (ref 0.3–1.2)
BUN: 17 mg/dL (ref 6–20)
CHLORIDE: 106 mmol/L (ref 101–111)
CO2: 22 mmol/L (ref 22–32)
Calcium: 10.2 mg/dL (ref 8.9–10.3)
Creatinine, Ser: 0.9 mg/dL (ref 0.44–1.00)
GFR calc Af Amer: >60 mL/min (ref >60)
Glucose, Bld: 106 mg/dL — ABNORMAL HIGH (ref 65–99)
POTASSIUM: 4.2 mmol/L (ref 3.5–5.1)
Sodium: 137 mmol/L (ref 135–145)
TOTAL PROTEIN: 7 g/dL (ref 6.5–8.1)

## 2017-03-31 LAB — CBC WITH DIFFERENTIAL/PLATELET
BASOS ABS: 0 10*3/uL (ref 0.0–0.1)
Basophils Relative: 0 %
EOS ABS: 0 10*3/uL (ref 0.0–0.7)
EOS PCT: 0 %
HEMATOCRIT: 41 % (ref 36.0–46.0)
Hemoglobin: 13.6 g/dL (ref 12.0–15.0)
LYMPHS ABS: 1.7 10*3/uL (ref 0.7–4.0)
Lymphocytes Relative: 17 %
MCH: 28 pg (ref 26.0–34.0)
MCHC: 33.2 g/dL (ref 30.0–36.0)
MCV: 84.4 fL (ref 78.0–100.0)
Monocytes Absolute: 0.6 10*3/uL (ref 0.1–1.0)
Monocytes Relative: 6 %
Neutro Abs: 8 10*3/uL — ABNORMAL HIGH (ref 1.7–7.7)
Neutrophils Relative %: 77 %
Platelets: 392 10*3/uL (ref 150–400)
RBC: 4.86 MIL/uL (ref 3.87–5.11)
RDW: 13.8 % (ref 11.5–15.5)
WBC: 10.4 10*3/uL (ref 4.0–10.5)

## 2017-03-31 LAB — LIPASE, BLOOD: LIPASE: 30 U/L (ref 11–51)

## 2017-03-31 LAB — I-STAT TROPONIN, ED: TROPONIN I, POC: 0.01 ng/mL (ref 0.00–0.08)

## 2017-03-31 LAB — TROPONIN I

## 2017-03-31 LAB — D-DIMER, QUANTITATIVE: D-Dimer, Quant: <0.27 ug/mL-FEU (ref 0.00–0.50)

## 2017-03-31 MED ORDER — HYDROXYZINE HCL 25 MG PO TABS: 25.0000 mg | ORAL_TABLET | Freq: Three times a day (TID) | ORAL | 0 refills | Status: DC | PRN

## 2017-03-31 MED ORDER — LORAZEPAM 0.5 MG PO TABS
0.5000 mg | ORAL_TABLET | Freq: Once | ORAL | Status: AC
  Administered 2017-03-31: 0.5 mg via ORAL
  Filled 2017-03-31: qty 1

## 2017-03-31 NOTE — ED Notes (Signed)
Pt ambulated in hallway independently

## 2017-03-31 NOTE — Discharge Instructions (Signed)
There is no evidence of heart attack or blood clot in the lung. Follow up with your doctor. Return to the ED if you develop new or worsening symptoms.

## 2017-04-04 DIAGNOSIS — F418 Other specified anxiety disorders: Secondary | ICD-10-CM | POA: Diagnosis not present

## 2017-04-12 DIAGNOSIS — Z6841 Body Mass Index (BMI) 40.0 and over, adult: Secondary | ICD-10-CM | POA: Diagnosis not present

## 2017-04-12 DIAGNOSIS — F331 Major depressive disorder, recurrent, moderate: Secondary | ICD-10-CM | POA: Diagnosis not present

## 2017-04-12 DIAGNOSIS — G43909 Migraine, unspecified, not intractable, without status migrainosus: Secondary | ICD-10-CM | POA: Diagnosis not present

## 2017-04-12 DIAGNOSIS — Z23 Encounter for immunization: Secondary | ICD-10-CM | POA: Diagnosis not present

## 2017-04-12 DIAGNOSIS — Z0000 Encounter for general adult medical examination without abnormal findings: Secondary | ICD-10-CM | POA: Diagnosis not present

## 2017-04-18 ENCOUNTER — Other Ambulatory Visit: Payer: Self-pay | Admitting: Internal Medicine

## 2017-04-18 DIAGNOSIS — Z1231 Encounter for screening mammogram for malignant neoplasm of breast: Secondary | ICD-10-CM

## 2017-04-18 DIAGNOSIS — M8580 Other specified disorders of bone density and structure, unspecified site: Secondary | ICD-10-CM

## 2017-04-20 ENCOUNTER — Ambulatory Visit (INDEPENDENT_AMBULATORY_CARE_PROVIDER_SITE_OTHER): Payer: PPO | Admitting: Neurology

## 2017-04-20 ENCOUNTER — Encounter: Payer: Self-pay | Admitting: Neurology

## 2017-04-20 VITALS — BP 136/80 | HR 86 | Ht 59.0 in | Wt 112.0 lb

## 2017-04-20 DIAGNOSIS — F4329 Adjustment disorder with other symptoms: Secondary | ICD-10-CM

## 2017-04-20 DIAGNOSIS — Z634 Disappearance and death of family member: Secondary | ICD-10-CM

## 2017-04-20 DIAGNOSIS — F4321 Adjustment disorder with depressed mood: Secondary | ICD-10-CM

## 2017-04-20 DIAGNOSIS — F5104 Psychophysiologic insomnia: Secondary | ICD-10-CM

## 2017-04-20 MED ORDER — SUMATRIPTAN SUCCINATE 100 MG PO TABS: ORAL_TABLET | ORAL | 2 refills | Status: DC

## 2017-04-20 NOTE — Progress Notes (Signed)
[facility]  PATIENT: Caitlin Irwin DOB: 20-Jan-1951   REASON FOR VISIT: Trigeminal neuralgia , atypical facial pain, migraines HISTORY FROM: Patient    HISTORY OF PRESENT ILLNESS: As the pleasure of seeing Caitlin Irwin today on 04/20/2017, she has been doing overall very well on less and less medication. Just recently she begun to see a counselor again and started on Prozac. This is also the anniversary months of her husband's death. She has more headaches just recently again and she wakes up early in the morning, not from pain, just spontaneously. She craves a good 7 hours of sleep continuously. She has been taking Xanax for many years but is no longer working it may work for anxiety but not for insomnia. I have mentioned a beta blocker the also could use Topamax and continued to use Imitrex. She can only get 9 Imitrex doses per months on her current medication insurance. I am worried that Topamax may impair cognitive function I would actually rather prefer to use a beta blocker. She no longer has facial pain, no longer needs gabapentin, and I think the best option to treat chronic insomnia would be cognitive behavior therapy.    04/19/16 CM Caitlin Irwin, 66 year old female returns for  yearly followup. She continues to go psychiatric treatment,grief counseling with depression and insomnia being her major concerns since her husbands death. Patient has been seeing Donata Clay, and was prescribed Celexa and Abilify.  She feels her depression and grief are under better control. Her facial pain is  stable, when she takes her meds she has cut back on the Gabapentin . She has seen Dr.Tatter who has referred her to Dr. Chauncy Passy at [facility].  She  is currently taking  gabapentin  twice daily instead of three times daily. Her migraines are in excellent control  on Topamax and Imitrex.She returns for reevaluation.  HISTORY: Caitlin Irwin, 66 year old female, recently widowed, returns for  followup.  She had gamma knife with success however continued numbness.  She was scheduled for ablation procedure but her husband died unexpectedly and she canceled the appointment. She is here today to get that rescheduled, it has already been approved by her insurance. She continues to bite the inside of her buccal tissue. There has been no change in her numbness. She returns for reevaluation.  She is severely depressed, and contacted hospice to join a grief counseling group. She has severe insomnia, has lost weight, and is visibly aged.  She has tried all kinds of sleep aids, none gives her the desired effect. She likely overdosed on a mix of pills, and had a breakdown last month.  She has had multiple falls, injured her rotator cuff and back. She is stressed, her house is on the market, she can not find the things she looks for.She cannot find certain objects out of the inheritance. 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     HISTORY CD- 0 2014 ; gamma knife surgery for facial pain.She has had success in the treatment of the neuralgia, but numbness remained, see letter for Dr. Salomon Fick.  He suggested a sphenopalatine ganglion block or ablation. I offered her to discuss this procedure, and this to be done here in [place] with Dr. Barbie Banner.Caitlin Irwin is again insomnic, sleeps on Ambien only 2-3 hours , has rebound insomnia from pain.  She is biting the inside of her buccal tissue   REVIEW OF SYSTEMS: Full 14 system review of systems performed and notable only for those listed, all others  are neg:    Anxiety,  frequent waking, early waking. Headaches.    ALLERGIES: Allergies  Allergen Reactions  . Acetaminophen Nausea And Vomiting    HOME MEDICATIONS: Outpatient Medications Prior to Visit  Medication Sig Dispense Refill  . ALPRAZolam (XANAX) 1 MG tablet TAKE 1 TABLET BY MOUTH TWICE A DAY AS NEEDED FOR ANXIETY 60 tablet 0  . estradiol (ESTRACE) 1 MG tablet  Take 1 mg by mouth daily.     . ondansetron (ZOFRAN) 4 MG tablet Take 1 tablet (4 mg total) by mouth every 8 (eight) hours as needed for nausea or vomiting. 15 tablet 0  . pilocarpine (SALAGEN) 5 MG tablet TAKE 1/2-1 TABLET BY MOUTH 4 TIMES DAILY AS NEEDED FOR DRY MOUTH  2  . spironolactone (ALDACTONE) 100 MG tablet Take 100 mg by mouth daily before breakfast.     . SUMAtriptan (IMITREX) 100 MG tablet TAKE 1 TABLET MAY REPEAT IN 2 HOURS IF HEADACHE PERSIST OR RECURES 15 tablet 11  . valACYclovir (VALTREX) 500 MG tablet Take 500 mg by mouth daily.    Marland Kitchen gabapentin (NEURONTIN) 300 MG capsule TAKE 1 CAPSULE (300 MG TOTAL) BY MOUTH 3 (THREE) TIMES DAILY. 90 capsule 11  . hydrocodone-ibuprofen (VICOPROFEN) 5-200 MG tablet Take 1 tablet by mouth every 8 (eight) hours as needed for pain. 12 tablet 0  . hydrOXYzine (ATARAX/VISTARIL) 25 MG tablet Take 1 tablet (25 mg total) by mouth every 8 (eight) hours as needed for anxiety. 12 tablet 0  . levothyroxine (SYNTHROID, LEVOTHROID) 75 MCG tablet Take 75 mcg by mouth daily before breakfast.     . meclizine (ANTIVERT) 25 MG tablet Take 1 tablet (25 mg total) by mouth 3 (three) times daily as needed for dizziness. 30 tablet 0  . methadone (DOLOPHINE) 5 MG tablet Take 5 mg by mouth daily.  0  . ondansetron (ZOFRAN ODT) 8 MG disintegrating tablet Take 1 tablet (8 mg total) by mouth every 8 (eight) hours as needed for nausea or vomiting. 20 tablet 0  . pantoprazole (PROTONIX) 40 MG tablet Take 40 mg by mouth 2 (two) times daily as needed (ulcer).     . penicillin v potassium (VEETID) 500 MG tablet Take 1 tablet (500 mg total) by mouth 4 (four) times daily. 28 tablet 0  . topiramate (TOPAMAX) 25 MG tablet TAKE 2 TABLETS (50 MG TOTAL) BY MOUTH 2 (TWO) TIMES DAILY. 120 tablet 6  . traMADol (ULTRAM) 50 MG tablet Take 50-100 mg by mouth every 8 (eight) hours as needed for moderate pain or severe pain.    . traZODone (DESYREL) 100 MG tablet Take 100-200 mg by mouth at  bedtime.      No facility-administered medications prior to visit.     PAST MEDICAL HISTORY: Past Medical History:  Diagnosis Date  . Accidental poisoning by aspirin 09/2014  . Anemia   . Anxiety   . Arthritis    "right knee; right shoulder" (09/15/2015)  . Ataxia 03/24/2014  . Cataract    both eyes  . Chronic lower back pain    scoliosis  . Depression   . GERD (gastroesophageal reflux disease)   . H/O hiatal hernia   . High cholesterol   . History of blood transfusion    "related to OR"  . Hypersomnia   . IBS (irritable bowel syndrome)   . Migraines    "maybe once/week now" (09/15/2015)  . Osteoporosis   . Other constipation   . Parotid tumor   . PONV (  postoperative nausea and vomiting)    hx of " getting too much" - 1998, slow to take up by body   . PUD (peptic ulcer disease)   . Trigeminal neuralgia    S/P radiation therapy   . Unspecified hypothyroidism     PAST SURGICAL HISTORY: Past Surgical History:  Procedure Laterality Date  . ABDOMINAL HYSTERECTOMY    . ANKLE FRACTURE SURGERY Left   . ANTERIOR LUMBAR FUSION  04/2010   L4-5; L5-S1  . BACK SURGERY    . BRAIN SURGERY     parotid tumor removed   . CHOLECYSTECTOMY OPEN    . EPIGASTRIC HERNIA REPAIR  09/15/2015   Procedure: OPEN HERNIA REPAIR EPIGASTRIC ADULT;  Surgeon: Greer Pickerel, MD;  Location: [facility];  Service: General;;  . ESOPHAGOGASTRODUODENOSCOPY  06/12/2012   Procedure: ESOPHAGOGASTRODUODENOSCOPY (EGD);  Surgeon: Lear Ng, MD;  Location: [facility];  Service: Endoscopy;  Laterality: N/A;  . FRACTURE SURGERY    . gamma knife for trig neuralgia  12/27/11  . [place]   "converted from scope to open during the OR; ligament repair"  . LAMINECTOMY AND MICRODISCECTOMY LUMBAR SPINE  1980   L4-5  . LAPAROSCOPIC EPIGASTRIC HERNIA REPAIR  09/15/2015   open  . PAROTID GLAND TUMOR EXCISION     benign  . SHOULDER ARTHROSCOPY W/ ROTATOR CUFF REPAIR Right  11-2014  . TRIGEMINAL NERVE DECOMPRESSION     to stop migraines and it did not work     FAMILY HISTORY: Family History  Problem Relation Age of Onset  . Ovarian cancer Mother   . Migraines Mother   . Diabetes Father   . Migraines Brother   . Heart disease Other   . Heart disease Maternal Grandfather     SOCIAL HISTORY: Social History   Social History  . Marital status: Widowed    Spouse name: N/A  . Number of children: 0  . Years of education: college   Occupational History  . disabled    Social History Main Topics  . Smoking status: Never Smoker  . Smokeless tobacco: Never Used  . Alcohol use No  . Drug use: No  . Sexual activity: Not Currently   Other Topics Concern  . Not on file   Social History Narrative   Patient lives at home alone.    Patient husband past away 07-03-13.   Patient is widowed.   Patient is on disability.    Patient is right handed.   Patient has no children.   Patient is a college grad.     PHYSICAL EXAM  Vitals:   04/20/17 1058  BP: 136/80  Pulse: 86  Weight: 112 lb (50.8 kg)  Height: 4\' 11"  (1.499 m)   Body mass index is 22.62 kg/m. Generalized: thin ,  in no acute distress. Head: normocephalic and atraumatic,  Neck: Supple, no carotid bruits. Cardiac: Regular rate rhythm, no murmur  Neurological examination  Mentation: Alert , oriented to time, place, Major depression is better. Follows all commands speech and language fluent,  Cranial nerve : Intact smell and taste. Pupils were equal round reactive to light extraocular movements were full, visual field were full on confrontational test. Facial sensation and strength were normal. hearing was intact to finger rubbing bilaterally. Uvula tongue midline. head turning and shoulder shrug were normal and symmetric.Tongue protrusion into cheek strength was normal. Coordination: finger-nose-finger bilaterally, no dysmetria, no tremor.  Reflexes: 2/2, plantar responses were  flexor  bilaterally. Gait and Station: Rising up from seated position without assistance,hasno need for assistive device   DIAGNOSTIC DATA (LABS, IMAGING, TESTING) - I reviewed patient records, labs, notes, testing and imaging myself where available.  Lab Results  Component Value Date   WBC 10.4 03/31/2017   HGB 13.6 03/31/2017   HCT 41.0 03/31/2017   MCV 84.4 03/31/2017   PLT 392 03/31/2017      Component Value Date/Time   NA 137 03/31/2017 0008   K 4.2 03/31/2017 0008   CL 106 03/31/2017 0008   CO2 22 03/31/2017 0008   GLUCOSE 106 (H) 03/31/2017 0008   BUN 17 03/31/2017 0008   CREATININE 0.90 03/31/2017 0008   CALCIUM 10.2 03/31/2017 0008   PROT 7.0 03/31/2017 0008   ALBUMIN 4.2 03/31/2017 0008   AST 19 03/31/2017 0008   ALT 12 (L) 03/31/2017 0008   ALKPHOS 77 03/31/2017 0008   BILITOT 0.8 03/31/2017 0008   GFRNONAA >60 03/31/2017 0008   GFRAA >60 03/31/2017 0008     ASSESSMENT AND PLAN  65 y.o. year old female  has a past medical history of Chronic pain;  Anxiety;  Trigeminal neuralgia; S/P therapy by gamma knife ;  Depression; Migraines; Neuralgia; Hypersomnia;  and Accidental ASA poisoning (08/2014). Here to follow-up;  Anniversary of her husbands sudden death - this month is hard for her. She takes xanax for anxiety - 1 mg tab.  Follow up yearly- next with NP again .  Continue with psychiatric visits, she had not seen her counselor for years, had been off Prozac.  She wakes every night at 5 AM- referral to cognitive behavioral therapy, hope she can get off Xanax. Larey Seat, MD   [facility] [address], [place], [place] [address] [phone]

## 2017-05-01 ENCOUNTER — Other Ambulatory Visit: Payer: Self-pay | Admitting: Neurology

## 2017-05-01 MED ORDER — ALPRAZOLAM 1 MG PO TABS: ORAL_TABLET | ORAL | 5 refills | Status: DC

## 2017-05-03 DIAGNOSIS — G3184 Mild cognitive impairment, so stated: Secondary | ICD-10-CM | POA: Diagnosis not present

## 2017-05-03 DIAGNOSIS — F329 Major depressive disorder, single episode, unspecified: Secondary | ICD-10-CM | POA: Diagnosis not present

## 2017-05-05 ENCOUNTER — Ambulatory Visit
Admission: RE | Admit: 2017-05-05 | Discharge: 2017-05-05 | Disposition: A | Discharge status: Home / Self Care | Payer: PPO | Source: Ambulatory Visit | Attending: Internal Medicine | Admitting: Internal Medicine

## 2017-05-05 DIAGNOSIS — M810 Age-related osteoporosis without current pathological fracture: Secondary | ICD-10-CM | POA: Diagnosis not present

## 2017-05-05 DIAGNOSIS — Z1231 Encounter for screening mammogram for malignant neoplasm of breast: Secondary | ICD-10-CM | POA: Diagnosis not present

## 2017-05-05 DIAGNOSIS — M8580 Other specified disorders of bone density and structure, unspecified site: Secondary | ICD-10-CM

## 2017-05-05 DIAGNOSIS — G43909 Migraine, unspecified, not intractable, without status migrainosus: Secondary | ICD-10-CM | POA: Diagnosis not present

## 2017-05-05 DIAGNOSIS — Z780 Asymptomatic menopausal state: Secondary | ICD-10-CM | POA: Diagnosis not present

## 2017-05-25 ENCOUNTER — Other Ambulatory Visit: Payer: Self-pay | Admitting: Neurology

## 2017-05-25 ENCOUNTER — Other Ambulatory Visit: Payer: Self-pay | Admitting: Nurse Practitioner

## 2017-05-26 ENCOUNTER — Other Ambulatory Visit: Payer: Self-pay | Admitting: Neurology

## 2017-07-17 ENCOUNTER — Other Ambulatory Visit: Payer: Self-pay | Admitting: Neurology

## 2017-11-22 ENCOUNTER — Other Ambulatory Visit: Payer: Self-pay | Admitting: Neurology

## 2017-12-14 ENCOUNTER — Other Ambulatory Visit: Payer: Self-pay | Admitting: Neurology

## 2017-12-19 ENCOUNTER — Telehealth: Payer: Self-pay | Admitting: Neurology

## 2017-12-19 ENCOUNTER — Other Ambulatory Visit: Payer: Self-pay | Admitting: Neurology

## 2017-12-19 MED ORDER — GABAPENTIN 100 MG PO CAPS: 100.0000 mg | ORAL_CAPSULE | Freq: Three times a day (TID) | ORAL | 5 refills | Status: DC

## 2017-12-19 NOTE — Telephone Encounter (Signed)
Called the patient she states that before she has seen Dr Brett Fairy for upper left side face pain which required her to see a Dr Salomon Fick for surgery.  Pt states a few months ago she started having pain in her lower jaw. At first she thought it was r/t to tooth but she has remembered that the pain is very similar to the trigeminal neuralgia she had before. Pt is asking if we can see her for this or if Dr Brett Fairy has any recommendations to help with treating this. I have informed her that I would make Dr Dohmeier aware and see what her thoughts are. Pt verbalized understanding and was appreciative

## 2017-12-19 NOTE — Telephone Encounter (Signed)
Pt requesting a call to discuss a recent reoccurrence of Trigeminal neuralgia on her left lower jaw. Pt would like to discuss the pain before reaching out to an doctors. Stating the pain has been on and off for 6 months. Please call to advise

## 2017-12-19 NOTE — Telephone Encounter (Signed)
After speaking with Dr Brett Fairy, she would like the patient to start taking gabapentin 100 mg TID to see if this will help the pain. Pt verbalized understanding. I have sent the script to the pharmacy on file. Pt states she has been seeing a therapist and has been unable to go out of the house for the last 6 mths. She states that she may have to go to hospital in the next wk or so but is hopeful this may help with the discomfort. Pt was appreciative for the call.

## 2017-12-21 ENCOUNTER — Other Ambulatory Visit: Payer: Self-pay | Admitting: Neurology

## 2017-12-28 DIAGNOSIS — G500 Trigeminal neuralgia: Secondary | ICD-10-CM | POA: Diagnosis not present

## 2017-12-28 DIAGNOSIS — Z9889 Other specified postprocedural states: Secondary | ICD-10-CM | POA: Diagnosis not present

## 2017-12-28 DIAGNOSIS — Z923 Personal history of irradiation: Secondary | ICD-10-CM | POA: Diagnosis not present

## 2018-01-12 DIAGNOSIS — F329 Major depressive disorder, single episode, unspecified: Secondary | ICD-10-CM | POA: Diagnosis not present

## 2018-01-12 DIAGNOSIS — G500 Trigeminal neuralgia: Secondary | ICD-10-CM | POA: Diagnosis not present

## 2018-01-12 DIAGNOSIS — F419 Anxiety disorder, unspecified: Secondary | ICD-10-CM | POA: Diagnosis not present

## 2018-01-12 DIAGNOSIS — R110 Nausea: Secondary | ICD-10-CM | POA: Diagnosis not present

## 2018-02-20 ENCOUNTER — Encounter (HOSPITAL_COMMUNITY): Payer: Self-pay

## 2018-02-20 ENCOUNTER — Emergency Department (HOSPITAL_COMMUNITY)
Admission: EM | Admit: 2018-02-20 | Discharge: 2018-02-20 | Disposition: A | Discharge status: Home / Self Care | Payer: PPO | Attending: Emergency Medicine | Admitting: Emergency Medicine

## 2018-02-20 ENCOUNTER — Emergency Department (HOSPITAL_COMMUNITY): Payer: PPO

## 2018-02-20 DIAGNOSIS — W108XXA Fall (on) (from) other stairs and steps, initial encounter: Secondary | ICD-10-CM | POA: Insufficient documentation

## 2018-02-20 DIAGNOSIS — R531 Weakness: Secondary | ICD-10-CM | POA: Insufficient documentation

## 2018-02-20 DIAGNOSIS — S0083XA Contusion of other part of head, initial encounter: Secondary | ICD-10-CM

## 2018-02-20 DIAGNOSIS — W19XXXA Unspecified fall, initial encounter: Secondary | ICD-10-CM | POA: Diagnosis not present

## 2018-02-20 DIAGNOSIS — R42 Dizziness and giddiness: Secondary | ICD-10-CM | POA: Diagnosis not present

## 2018-02-20 DIAGNOSIS — E039 Hypothyroidism, unspecified: Secondary | ICD-10-CM | POA: Insufficient documentation

## 2018-02-20 DIAGNOSIS — R404 Transient alteration of awareness: Secondary | ICD-10-CM | POA: Diagnosis not present

## 2018-02-20 DIAGNOSIS — Z79899 Other long term (current) drug therapy: Secondary | ICD-10-CM | POA: Diagnosis not present

## 2018-02-20 DIAGNOSIS — Y998 Other external cause status: Secondary | ICD-10-CM | POA: Diagnosis not present

## 2018-02-20 DIAGNOSIS — S0990XA Unspecified injury of head, initial encounter: Secondary | ICD-10-CM | POA: Diagnosis not present

## 2018-02-20 DIAGNOSIS — M542 Cervicalgia: Secondary | ICD-10-CM | POA: Diagnosis not present

## 2018-02-20 DIAGNOSIS — Y92008 Other place in unspecified non-institutional (private) residence as the place of occurrence of the external cause: Secondary | ICD-10-CM | POA: Insufficient documentation

## 2018-02-20 DIAGNOSIS — R110 Nausea: Secondary | ICD-10-CM | POA: Diagnosis not present

## 2018-02-20 DIAGNOSIS — S199XXA Unspecified injury of neck, initial encounter: Secondary | ICD-10-CM | POA: Diagnosis not present

## 2018-02-20 DIAGNOSIS — Y9301 Activity, walking, marching and hiking: Secondary | ICD-10-CM | POA: Diagnosis not present

## 2018-02-20 LAB — COMPREHENSIVE METABOLIC PANEL
ALBUMIN: 3.3 g/dL — AB (ref 3.5–5.0)
ALT: 10 U/L (ref 0–44)
AST: 14 U/L — AB (ref 15–41)
Alkaline Phosphatase: 58 U/L (ref 38–126)
Anion gap: 7 (ref 5–15)
BUN: 12 mg/dL (ref 8–23)
CHLORIDE: 103 mmol/L (ref 98–111)
CO2: 22 mmol/L (ref 22–32)
CREATININE: 0.63 mg/dL (ref 0.44–1.00)
Calcium: 8.4 mg/dL — ABNORMAL LOW (ref 8.9–10.3)
GFR calc non Af Amer: >60 mL/min (ref >60)
GLUCOSE: 90 mg/dL (ref 70–99)
Potassium: 3.4 mmol/L — ABNORMAL LOW (ref 3.5–5.1)
SODIUM: 132 mmol/L — AB (ref 135–145)
Total Bilirubin: 0.7 mg/dL (ref 0.3–1.2)
Total Protein: 5.4 g/dL — ABNORMAL LOW (ref 6.5–8.1)

## 2018-02-20 LAB — CBC WITH DIFFERENTIAL/PLATELET
ABS IMMATURE GRANULOCYTES: 0 10*3/uL (ref 0.0–0.1)
BASOS ABS: 0 10*3/uL (ref 0.0–0.1)
BASOS PCT: 1 %
Eosinophils Absolute: 0 10*3/uL (ref 0.0–0.7)
Eosinophils Relative: 1 %
HCT: 34.4 % — ABNORMAL LOW (ref 36.0–46.0)
HEMOGLOBIN: 11.3 g/dL — AB (ref 12.0–15.0)
IMMATURE GRANULOCYTES: 0 %
LYMPHS PCT: 20 %
Lymphs Abs: 1.2 10*3/uL (ref 0.7–4.0)
MCH: 30.6 pg (ref 26.0–34.0)
MCHC: 32.8 g/dL (ref 30.0–36.0)
MCV: 93.2 fL (ref 78.0–100.0)
Monocytes Absolute: 0.4 10*3/uL (ref 0.1–1.0)
Monocytes Relative: 8 %
NEUTROS ABS: 4.1 10*3/uL (ref 1.7–7.7)
NEUTROS PCT: 70 %
PLATELETS: 198 10*3/uL (ref 150–400)
RBC: 3.69 MIL/uL — AB (ref 3.87–5.11)
RDW: 12.4 % (ref 11.5–15.5)
WBC: 5.8 10*3/uL (ref 4.0–10.5)

## 2018-02-20 LAB — PROTIME-INR
INR: 1.07
Prothrombin Time: 13.8 seconds (ref 11.4–15.2)

## 2018-02-20 LAB — APTT: aPTT: 31 seconds (ref 24–36)

## 2018-02-20 MED ORDER — TRAMADOL HCL 50 MG PO TABS: 50.0000 mg | ORAL_TABLET | Freq: Four times a day (QID) | ORAL | 0 refills | Status: DC | PRN

## 2018-02-20 MED ORDER — METOCLOPRAMIDE HCL 10 MG PO TABS: 10.0000 mg | ORAL_TABLET | Freq: Once | ORAL | Status: DC

## 2018-02-20 MED ORDER — METOCLOPRAMIDE HCL 10 MG PO TABS: 10.0000 mg | ORAL_TABLET | Freq: Four times a day (QID) | ORAL | 0 refills | Status: DC | PRN

## 2018-02-20 MED ORDER — METOCLOPRAMIDE HCL 5 MG/ML IJ SOLN
10.0000 mg | Freq: Once | INTRAMUSCULAR | Status: AC
  Administered 2018-02-20: 10 mg via INTRAVENOUS
  Filled 2018-02-20: qty 2

## 2018-02-20 MED ORDER — TRAMADOL HCL 50 MG PO TABS
50.0000 mg | ORAL_TABLET | Freq: Once | ORAL | Status: AC
Start: 2018-02-20 — End: 2018-02-20
  Administered 2018-02-20: 50 mg via ORAL
  Filled 2018-02-20: qty 1

## 2018-02-20 MED ORDER — KETOROLAC TROMETHAMINE 30 MG/ML IJ SOLN
30.0000 mg | Freq: Once | INTRAMUSCULAR | Status: AC
  Administered 2018-02-20: 30 mg via INTRAVENOUS
  Filled 2018-02-20: qty 1

## 2018-02-20 NOTE — ED Notes (Signed)
Pt verbalizes understanding of d/c instructions. Pt received prescriptions. Pt taken to lobby in wheelchair at d/c with all belongings.   

## 2018-02-20 NOTE — ED Provider Notes (Signed)
[facility]PART[facility] Provider Note   CSN: [id number] Arrival date & time: 02/20/18  1125     History   Chief Complaint Chief Complaint  Patient presents with  . Fall    HPI Caitlin Irwin is a 67 y.o. female.  HPI Patient states she was coming down her stairs yesterday and missed a step.  She fell down 14 stairs.  Struck her head but denies loss of consciousness.  She complains of headache, neck pain and generalized weakness.  She is had nausea but no vomiting. Past Medical History:  Diagnosis Date  . Accidental poisoning by aspirin 09/2014  . Anemia   . Anxiety   . Arthritis    "right knee; right shoulder" (09/15/2015)  . Ataxia 03/24/2014  . Cataract    both eyes  . Chronic lower back pain    scoliosis  . Depression   . GERD (gastroesophageal reflux disease)   . H/O hiatal hernia   . High cholesterol   . History of blood transfusion    "related to OR"  . Hypersomnia   . IBS (irritable bowel syndrome)   . Migraines    "maybe once/week now" (09/15/2015)  . Osteoporosis   . Other constipation   . Parotid tumor   . PONV (postoperative nausea and vomiting)    hx of " getting too much" - 1998, slow to take up by body   . PUD (peptic ulcer disease)   . Trigeminal neuralgia    S/P radiation therapy   . Unspecified hypothyroidism     Patient Active Problem List   Diagnosis Date Noted  . Epigastric hernia 09/15/2015  . Migraine, intractable 05/28/2015  . Migraine 05/28/2015  . Headache, migraine   . Insomnia   . Emesis   . History of hypothyroidism   . Microcytic hypochromic anemia   . Salicylate poisoning 81/08/7508  . Migraine headache 08/28/2014  . Hypokalemia 08/28/2014  . Salicylate intoxication 08/28/2014  . Aspirin toxicity   . Ataxia 03/24/2014  . Complicated grief 25/85/2778  . Altered mental status, unspecified altered mental status type 10/13/2013  . Polypharmacy 10/13/2013  . Depression 10/13/2013  . Atypical  face pain 02/10/2013  . Esophageal reflux 06/12/2012  . Trigeminal neuralgia 11/07/2011  . Hx of migraine headaches 10/20/2011  . Dysphagia, unspecified(787.20) 09/26/2011  . Weight gain 09/26/2011  . NAUSEA 02/02/2010  . DIARRHEA 02/02/2010  . CHANGE IN BOWELS 02/02/2010  . ULCER-DUODENAL 11/26/2008  . History of peptic ulcer 11/26/2008  . GASTRITIS 11/26/2008  . HYPOTHYROIDISM 10/03/2007  . CONSTIPATION, CHRONIC 10/03/2007  . IRRITABLE BOWEL SYNDROME 10/03/2007  . COLITIS, HX OF 10/03/2007    Past Surgical History:  Procedure Laterality Date  . ABDOMINAL HYSTERECTOMY    . ANKLE FRACTURE SURGERY Left   . ANTERIOR LUMBAR FUSION  04/2010   L4-5; L5-S1  . BACK SURGERY    . BRAIN SURGERY     parotid tumor removed   . CHOLECYSTECTOMY OPEN    . EPIGASTRIC HERNIA REPAIR  09/15/2015   Procedure: OPEN HERNIA REPAIR EPIGASTRIC ADULT;  Surgeon: Greer Pickerel, MD;  Location: [facility];  Service: General;;  . ESOPHAGOGASTRODUODENOSCOPY  06/12/2012   Procedure: ESOPHAGOGASTRODUODENOSCOPY (EGD);  Surgeon: Lear Ng, MD;  Location: [facility];  Service: Endoscopy;  Laterality: N/A;  . FRACTURE SURGERY    . gamma knife for trig neuralgia  12/27/11  . HERNIA REPAIR    . KNEE CARTILAGE SURGERY Right 1981   "converted from scope to  open during the OR; ligament repair"  . LAMINECTOMY AND MICRODISCECTOMY LUMBAR SPINE  1980   L4-5  . LAPAROSCOPIC EPIGASTRIC HERNIA REPAIR  09/15/2015   open  . PAROTID GLAND TUMOR EXCISION     benign  . SHOULDER ARTHROSCOPY W/ ROTATOR CUFF REPAIR Right 11-2014  . TRIGEMINAL NERVE DECOMPRESSION     to stop migraines and it did not work      OB History   None      Home Medications    Prior to Admission medications   Medication Sig Start Date End Date Taking? Authorizing Provider  ALPRAZolam Duanne Moron) 1 MG tablet TAKE 1 TABLET TWICE ADAY AS NEEDED FOR ANXIETY 11/22/17  Yes Sater, Nanine Means, MD  estradiol (ESTRACE) 1 MG tablet Take 1 mg by mouth daily.   04/21/13  Yes [provider]  FLUoxetine (PROZAC) 20 MG capsule Take 20 mg by mouth daily.  04/04/17  Yes [provider]  gabapentin (NEURONTIN) 100 MG capsule TAKE 1 CAPSULE BY MOUTH THREE TIMES A DAY 12/22/17  Yes Dohmeier, Asencion Partridge, MD  levothyroxine (SYNTHROID, LEVOTHROID) 75 MCG tablet Take 75 mcg by mouth daily before breakfast.  04/09/17  Yes [provider]  naproxen sodium (ANAPROX) 550 MG tablet Take 550 mg by mouth as needed.  04/16/17  Yes [provider]  ondansetron (ZOFRAN) 4 MG tablet Take 1 tablet (4 mg total) by mouth every 8 (eight) hours as needed for nausea or vomiting. 11/12/15  Yes Shary Decamp, PA-C  OXcarbazepine (TRILEPTAL) 150 MG tablet Take 300 mg by mouth daily.  12/20/17  Yes [provider]  pantoprazole (PROTONIX) 40 MG tablet Take 40 mg by mouth as needed. 02/18/12  Yes [provider]  pilocarpine (SALAGEN) 5 MG tablet TAKE 1/2-1 TABLET BY MOUTH  DAILY AS NEEDED FOR DRY MOUTH 02/17/15  Yes [provider]  spironolactone (ALDACTONE) 100 MG tablet Take 100 mg by mouth daily before breakfast.    Yes [provider]  SUMAtriptan (IMITREX) 100 MG tablet MAY REPEAT IN 2 HOURS IF HEADACHE PERSISTS OR RECURS 05/26/17  Yes Dohmeier, Asencion Partridge, MD  topiramate (TOPAMAX) 25 MG tablet TAKE 2 TABLETS (50 MG TOTAL) BY MOUTH 2 (TWO) TIMES DAILY. 12/15/17  Yes Dohmeier, Asencion Partridge, MD  traZODone (DESYREL) 50 MG tablet Take 50 mg by mouth as needed. 01/22/18  Yes [provider]  valACYclovir (VALTREX) 500 MG tablet Take 500 mg by mouth daily.   Yes [provider]  metoCLOPramide (REGLAN) 10 MG tablet Take 1 tablet (10 mg total) by mouth every 6 (six) hours as needed for nausea (nausea/headache). 02/20/18   Julianne Rice, MD  traMADol (ULTRAM) 50 MG tablet Take 1 tablet (50 mg total) by mouth every 6 (six) hours as needed. 02/20/18   Julianne Rice, MD  eletriptan (RELPAX) 40 MG tablet One tablet by mouth as  needed for migraine headache.  If the headache improves and then returns, dose may be repeated after 2 hours have elapsed since first dose (do not exceed 80 mg per day). may repeat in 2 hours if necessary  10/20/11  [provider]  ferrous sulfate 325 (65 FE) MG tablet Take 325 mg by mouth daily with breakfast.  10/20/11  [provider]    Family History Family History  Problem Relation Age of Onset  . Ovarian cancer Mother   . Migraines Mother   . Diabetes Father   . Migraines Brother   . Heart disease Other   . Heart disease  Maternal Grandfather     Social History Social History   Tobacco Use  . Smoking status: Never Smoker  . Smokeless tobacco: Never Used  Substance Use Topics  . Alcohol use: No  . Drug use: No     Allergies   Acetaminophen   Review of Systems Review of Systems  Constitutional: Negative for chills and fever.  HENT: Positive for facial swelling. Negative for trouble swallowing.   Eyes: Negative for visual disturbance.  Respiratory: Negative for cough and shortness of breath.   Cardiovascular: Negative for chest pain, palpitations and leg swelling.  Gastrointestinal: Positive for nausea. Negative for abdominal pain, constipation, diarrhea and vomiting.  Musculoskeletal: Positive for neck pain. Negative for back pain, myalgias and neck stiffness.  Skin: Negative for rash.  Neurological: Positive for weakness and headaches. Negative for syncope and numbness.  All other systems reviewed and are negative.    Physical Exam Updated Vital Signs BP 118/63 (BP Location: Right Arm)   Pulse 60   Resp 15   Physical Exam  Constitutional: She is oriented to person, place, and time. She appears well-developed and well-nourished. No distress.  HENT:  Head: Normocephalic and atraumatic.  Mouth/Throat: Oropharynx is clear and moist.  Patient with right eyebrow/forehead hematoma and mild right periorbital ecchymosis.  Midface is stable.   Oropharynx is clear.  No intraoral trauma.  Eyes: Pupils are equal, round, and reactive to light. EOM are normal.  No hyphema.  No regular pupils.  Patient does have cataract in the right eye  Neck: Normal range of motion. Neck supple.  Minimal midline posterior cervical tenderness to palpation.  Cardiovascular: Normal rate and regular rhythm. Exam reveals no gallop and no friction rub.  No murmur heard. Pulmonary/Chest: Effort normal and breath sounds normal. No stridor. No respiratory distress. She has no wheezes. She has no rales. She exhibits no tenderness.  Abdominal: Soft. Bowel sounds are normal. There is no tenderness. There is no rebound and no guarding.  Musculoskeletal: Normal range of motion. She exhibits no edema or tenderness.  No midline thoracic or lumbar tenderness.  Full range of motion of all joints without obvious deformity, swelling or pain.  Distal pulses intact.  Neurological: She is alert and oriented to person, place, and time.  5/5 motor in all extremities.  Sensation fully intact.  Skin: Skin is warm and dry. No rash noted. She is not diaphoretic. No erythema.  Psychiatric: She has a normal mood and affect. Her behavior is normal.  Nursing note and vitals reviewed.    ED Treatments / Results  Labs (all labs ordered are listed, but only abnormal results are displayed) Labs Reviewed  CBC WITH DIFFERENTIAL/PLATELET - Abnormal; Notable for the following components:      Result Value   RBC 3.69 (*)    Hemoglobin 11.3 (*)    HCT 34.4 (*)    All other components within normal limits  COMPREHENSIVE METABOLIC PANEL - Abnormal; Notable for the following components:   Sodium 132 (*)    Potassium 3.4 (*)    Calcium 8.4 (*)    Total Protein 5.4 (*)    Albumin 3.3 (*)    AST 14 (*)    All other components within normal limits  PROTIME-INR  APTT    EKG None  Radiology Ct Head Wo Contrast  Result Date: 02/20/2018 CLINICAL DATA:  67 year old female fell  yesterday down 14 steps. Dizziness and gait abnormality with nausea. Prior gamma knife surgery for trigeminal neuralgia. Initial encounter.  EXAM: CT HEAD WITHOUT CONTRAST CT CERVICAL SPINE WITHOUT CONTRAST TECHNIQUE: Multidetector CT imaging of the head and cervical spine was performed following the standard protocol without intravenous contrast. Multiplanar CT image reconstructions of the cervical spine were also generated. COMPARISON:  03/30/2017 head CT. 01/09/2013 head CT and cervical spine CT. 10/12/2016 brain MR. FINDINGS: CT HEAD FINDINGS Brain: No intracranial hemorrhage or CT evidence of large acute infarct. Remote left craniotomy with similar appearance of postsurgical changes including rounded structure projecting into the posterior left cerebellum. Stable appearance of radiopaque material adjacent to left petrous apex. No intracranial mass lesion noted on this unenhanced exam. Vascular: No hyperdense vessel. Skull: No skull fracture Sinuses/Orbits: No acute orbital abnormality. Visualized paranasal sinuses, mastoid air cells and middle ear cavities are clear. Other: Right frontal scalp hematoma. CT CERVICAL SPINE FINDINGS Alignment: Minimal anterior slip C4 and C5 similar to prior exam. Skull base and vertebrae: No cervical spine fracture noted. Soft tissues and spinal canal: No abnormal prevertebral soft tissue swelling. Disc levels: Multilevel cervical spondylotic changes most notable C6-7 level. Upper chest: No lung apical mass. Other: No obvious neck mass. IMPRESSION: 1. Right frontal scalp hematoma without underlying fracture or intracranial hemorrhage. 2. Postsurgical changes posterior fossa unchanged. 3. Cervical spine alignment similar to prior exam. No cervical spine fracture or abnormal prevertebral soft tissue swelling. 4. Multilevel cervical spondylotic changes most notable C6-7 level. Electronically Signed   By: Genia Del M.D.   On: 02/20/2018 13:25   Ct Cervical Spine Wo  Contrast  Result Date: 02/20/2018 CLINICAL DATA:  67 year old female fell yesterday down 14 steps. Dizziness and gait abnormality with nausea. Prior gamma knife surgery for trigeminal neuralgia. Initial encounter. EXAM: CT HEAD WITHOUT CONTRAST CT CERVICAL SPINE WITHOUT CONTRAST TECHNIQUE: Multidetector CT imaging of the head and cervical spine was performed following the standard protocol without intravenous contrast. Multiplanar CT image reconstructions of the cervical spine were also generated. COMPARISON:  03/30/2017 head CT. 01/09/2013 head CT and cervical spine CT. 10/12/2016 brain MR. FINDINGS: CT HEAD FINDINGS Brain: No intracranial hemorrhage or CT evidence of large acute infarct. Remote left craniotomy with similar appearance of postsurgical changes including rounded structure projecting into the posterior left cerebellum. Stable appearance of radiopaque material adjacent to left petrous apex. No intracranial mass lesion noted on this unenhanced exam. Vascular: No hyperdense vessel. Skull: No skull fracture Sinuses/Orbits: No acute orbital abnormality. Visualized paranasal sinuses, mastoid air cells and middle ear cavities are clear. Other: Right frontal scalp hematoma. CT CERVICAL SPINE FINDINGS Alignment: Minimal anterior slip C4 and C5 similar to prior exam. Skull base and vertebrae: No cervical spine fracture noted. Soft tissues and spinal canal: No abnormal prevertebral soft tissue swelling. Disc levels: Multilevel cervical spondylotic changes most notable C6-7 level. Upper chest: No lung apical mass. Other: No obvious neck mass. IMPRESSION: 1. Right frontal scalp hematoma without underlying fracture or intracranial hemorrhage. 2. Postsurgical changes posterior fossa unchanged. 3. Cervical spine alignment similar to prior exam. No cervical spine fracture or abnormal prevertebral soft tissue swelling. 4. Multilevel cervical spondylotic changes most notable C6-7 level. Electronically Signed   By:  Genia Del M.D.   On: 02/20/2018 13:25    Procedures Procedures (including critical care time)  Medications Ordered in ED Medications  traMADol (ULTRAM) tablet 50 mg (has no administration in time range)  ketorolac (TORADOL) 30 MG/ML injection 30 mg (30 mg Intravenous Given 02/20/18 1408)  metoCLOPramide (REGLAN) injection 10 mg (10 mg Intravenous Given 02/20/18 1408)     Initial  Impression / Assessment and Plan / ED Course  I have reviewed the triage vital signs and the nursing notes.  Pertinent labs & imaging results that were available during my care of the patient were reviewed by me and considered in my medical decision making (see chart for details).     CT head and cervical spine without acute findings.  Will treat symptomatically.  Suspect possible concussion. She has history of chronic headache with nausea and vomiting.  She is in pain management for this.  Symptoms are reasonably controlled and she has no neurologic deficits.  Discharge with head injury precautions. Final Clinical Impressions(s) / ED Diagnoses   Final diagnoses:  Forehead contusion, initial encounter  Injury of head, initial encounter    ED Discharge Orders        Ordered    traMADol (ULTRAM) 50 MG tablet  Every 6 hours PRN     02/20/18 1507    metoCLOPramide (REGLAN) 10 MG tablet  Every 6 hours PRN     02/20/18 1507       Julianne Rice, MD 02/20/18 1507

## 2018-02-20 NOTE — ED Triage Notes (Signed)
Pt arrived via [facility]; per EMS pt from [facility], with c/o fall yesterday eveing approx 14 steps from top to bttm; R forehead hematoma; blak eye R side; refused EMS last night due to not wanting to leave pet; pt /o dizziness, gait abnormality and nausea; denies LOC; elbows and knees borthering; Pt re'd zofran 4mg ; BG 92, 122/70, 68, Hx of Vertigo and pt states that she "tripped and fell."

## 2018-03-09 ENCOUNTER — Other Ambulatory Visit: Payer: Self-pay | Admitting: Neurology

## 2018-03-27 DIAGNOSIS — S300XXA Contusion of lower back and pelvis, initial encounter: Secondary | ICD-10-CM | POA: Diagnosis not present

## 2018-04-23 ENCOUNTER — Telehealth: Payer: Self-pay | Admitting: Neurology

## 2018-04-23 ENCOUNTER — Ambulatory Visit: Payer: PPO | Admitting: Neurology

## 2018-04-23 NOTE — Telephone Encounter (Signed)
Patient called today and cancelled her apt stating she was having a migraine and unable to make the apt.

## 2018-04-25 ENCOUNTER — Encounter: Payer: Self-pay | Admitting: Neurology

## 2018-04-27 ENCOUNTER — Other Ambulatory Visit: Payer: Self-pay | Admitting: Neurology

## 2018-05-07 ENCOUNTER — Ambulatory Visit (HOSPITAL_COMMUNITY)
Admission: RE | Admit: 2018-05-07 | Discharge: 2018-05-07 | Disposition: A | Discharge status: Home / Self Care | Payer: PPO | Attending: Psychiatry | Admitting: Psychiatry

## 2018-05-07 DIAGNOSIS — F4000 Agoraphobia, unspecified: Secondary | ICD-10-CM | POA: Diagnosis not present

## 2018-05-07 DIAGNOSIS — F419 Anxiety disorder, unspecified: Secondary | ICD-10-CM | POA: Diagnosis not present
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
## 2018-05-07 NOTE — H&P (Signed)
Behavioral Health Medical Screening Exam  Caitlin Irwin is an 67 y.o. female who presents as a walk in with family members due to severe anxiety. She reports history of agoraphobia. Patient was given outpatient resources for IOP and for Psychiatry. Patient did not meet criteria for inpatient psychiatric care.   Total Time spent with patient: 20 minutes  Psychiatric Specialty Exam: Physical Exam  Constitutional: She is oriented to person, place, and time. She appears well-developed and well-nourished.  Cardiovascular: Normal rate, regular rhythm, normal heart sounds and intact distal pulses.  Respiratory: Effort normal and breath sounds normal.  GI: Soft. Bowel sounds are normal.  Musculoskeletal: Normal range of motion.  Neurological: She is alert and oriented to person, place, and time.  Skin: Skin is warm and dry.    ROS  There were no vitals taken for this visit.There is no height or weight on file to calculate BMI.  General Appearance: Casual  Eye Contact:  Good  Speech:  Clear and Coherent  Volume:  Normal  Mood:  Anxious  Affect:  Congruent  Thought Process:  Coherent and Goal Directed  Orientation:  Full (Time, Place, and Person)  Thought Content:  Rumination  Suicidal Thoughts:  No  Homicidal Thoughts:  No  Memory:  Immediate;   Good Recent;   Good Remote;   Good  Judgement:  Good  Insight:  Present  Psychomotor Activity:  Normal  Concentration: Concentration: Good and Attention Span: Good  Recall:  Good  Fund of Knowledge:Good  Language: Good  Akathisia:  No  Handed:  Right  AIMS (if indicated):     Assets:  Communication Skills Desire for Improvement Financial Resources/Insurance Housing Intimacy Leisure Time [place] Talents/Skills  Sleep:       Musculoskeletal: Strength & Muscle Tone: within normal limits Gait & Station: normal Patient leans: N/A  There were no vitals taken for this  visit.  Recommendations:  Based on my evaluation the patient does not appear to have an emergency medical condition.  Elmarie Shiley, NP 05/07/2018, 5:12 PM

## 2018-05-17 ENCOUNTER — Other Ambulatory Visit: Payer: Self-pay | Admitting: Neurology

## 2018-05-21 DIAGNOSIS — F4322 Adjustment disorder with anxiety: Secondary | ICD-10-CM | POA: Diagnosis not present

## 2018-06-05 ENCOUNTER — Other Ambulatory Visit: Payer: Self-pay | Admitting: Neurology

## 2018-06-07 ENCOUNTER — Other Ambulatory Visit: Payer: Self-pay | Admitting: Neurology

## 2018-06-18 DIAGNOSIS — F419 Anxiety disorder, unspecified: Secondary | ICD-10-CM | POA: Diagnosis not present

## 2018-06-18 DIAGNOSIS — M810 Age-related osteoporosis without current pathological fracture: Secondary | ICD-10-CM | POA: Diagnosis not present

## 2018-06-18 DIAGNOSIS — D649 Anemia, unspecified: Secondary | ICD-10-CM | POA: Diagnosis not present

## 2018-06-18 DIAGNOSIS — F322 Major depressive disorder, single episode, severe without psychotic features: Secondary | ICD-10-CM | POA: Diagnosis not present

## 2018-06-25 DIAGNOSIS — F4322 Adjustment disorder with anxiety: Secondary | ICD-10-CM | POA: Diagnosis not present

## 2018-06-27 ENCOUNTER — Other Ambulatory Visit: Payer: Self-pay | Admitting: Neurology

## 2018-07-06 DIAGNOSIS — F4323 Adjustment disorder with mixed anxiety and depressed mood: Secondary | ICD-10-CM | POA: Diagnosis not present

## 2018-11-13 ENCOUNTER — Encounter (HOSPITAL_COMMUNITY): Payer: Self-pay | Admitting: Emergency Medicine

## 2018-11-13 ENCOUNTER — Inpatient Hospital Stay (HOSPITAL_COMMUNITY)
Admission: EM | Admit: 2018-11-13 | Discharge: 2018-11-20 | DRG: 493 | Disposition: A | Discharge status: Skilled Nursing Facility | Payer: PPO | Attending: Family Medicine | Admitting: Family Medicine

## 2018-11-13 ENCOUNTER — Emergency Department (HOSPITAL_COMMUNITY): Payer: PPO

## 2018-11-13 ENCOUNTER — Other Ambulatory Visit: Payer: Self-pay

## 2018-11-13 ENCOUNTER — Inpatient Hospital Stay (HOSPITAL_COMMUNITY): Payer: PPO

## 2018-11-13 DIAGNOSIS — R0902 Hypoxemia: Secondary | ICD-10-CM | POA: Diagnosis not present

## 2018-11-13 DIAGNOSIS — G43909 Migraine, unspecified, not intractable, without status migrainosus: Secondary | ICD-10-CM | POA: Diagnosis not present

## 2018-11-13 DIAGNOSIS — G4710 Hypersomnia, unspecified: Secondary | ICD-10-CM | POA: Diagnosis not present

## 2018-11-13 DIAGNOSIS — M2550 Pain in unspecified joint: Secondary | ICD-10-CM | POA: Diagnosis not present

## 2018-11-13 DIAGNOSIS — G500 Trigeminal neuralgia: Secondary | ICD-10-CM | POA: Diagnosis present

## 2018-11-13 DIAGNOSIS — W010XXA Fall on same level from slipping, tripping and stumbling without subsequent striking against object, initial encounter: Secondary | ICD-10-CM | POA: Diagnosis present

## 2018-11-13 DIAGNOSIS — F329 Major depressive disorder, single episode, unspecified: Secondary | ICD-10-CM | POA: Diagnosis not present

## 2018-11-13 DIAGNOSIS — Z79891 Long term (current) use of opiate analgesic: Secondary | ICD-10-CM

## 2018-11-13 DIAGNOSIS — R110 Nausea: Secondary | ICD-10-CM | POA: Diagnosis not present

## 2018-11-13 DIAGNOSIS — Z79890 Hormone replacement therapy (postmenopausal): Secondary | ICD-10-CM

## 2018-11-13 DIAGNOSIS — G8929 Other chronic pain: Secondary | ICD-10-CM | POA: Diagnosis present

## 2018-11-13 DIAGNOSIS — S82191A Other fracture of upper end of right tibia, initial encounter for closed fracture: Secondary | ICD-10-CM | POA: Diagnosis not present

## 2018-11-13 DIAGNOSIS — Z923 Personal history of irradiation: Secondary | ICD-10-CM

## 2018-11-13 DIAGNOSIS — Z8249 Family history of ischemic heart disease and other diseases of the circulatory system: Secondary | ICD-10-CM | POA: Diagnosis not present

## 2018-11-13 DIAGNOSIS — Z8669 Personal history of other diseases of the nervous system and sense organs: Secondary | ICD-10-CM | POA: Diagnosis not present

## 2018-11-13 DIAGNOSIS — K219 Gastro-esophageal reflux disease without esophagitis: Secondary | ICD-10-CM | POA: Diagnosis not present

## 2018-11-13 DIAGNOSIS — M545 Low back pain: Secondary | ICD-10-CM | POA: Diagnosis not present

## 2018-11-13 DIAGNOSIS — S82101A Unspecified fracture of upper end of right tibia, initial encounter for closed fracture: Secondary | ICD-10-CM | POA: Diagnosis present

## 2018-11-13 DIAGNOSIS — D62 Acute posthemorrhagic anemia: Secondary | ICD-10-CM | POA: Diagnosis not present

## 2018-11-13 DIAGNOSIS — S82254A Nondisplaced comminuted fracture of shaft of right tibia, initial encounter for closed fracture: Secondary | ICD-10-CM | POA: Diagnosis present

## 2018-11-13 DIAGNOSIS — K589 Irritable bowel syndrome without diarrhea: Secondary | ICD-10-CM | POA: Diagnosis present

## 2018-11-13 DIAGNOSIS — R52 Pain, unspecified: Secondary | ICD-10-CM | POA: Diagnosis not present

## 2018-11-13 DIAGNOSIS — K449 Diaphragmatic hernia without obstruction or gangrene: Secondary | ICD-10-CM | POA: Diagnosis not present

## 2018-11-13 DIAGNOSIS — S82454A Nondisplaced comminuted fracture of shaft of right fibula, initial encounter for closed fracture: Secondary | ICD-10-CM | POA: Diagnosis not present

## 2018-11-13 DIAGNOSIS — R2681 Unsteadiness on feet: Secondary | ICD-10-CM | POA: Diagnosis not present

## 2018-11-13 DIAGNOSIS — S82831K Other fracture of upper and lower end of right fibula, subsequent encounter for closed fracture with nonunion: Secondary | ICD-10-CM | POA: Diagnosis not present

## 2018-11-13 DIAGNOSIS — R41841 Cognitive communication deficit: Secondary | ICD-10-CM | POA: Diagnosis not present

## 2018-11-13 DIAGNOSIS — Y93K1 Activity, walking an animal: Secondary | ICD-10-CM | POA: Diagnosis not present

## 2018-11-13 DIAGNOSIS — M542 Cervicalgia: Secondary | ICD-10-CM | POA: Diagnosis not present

## 2018-11-13 DIAGNOSIS — Z7401 Bed confinement status: Secondary | ICD-10-CM | POA: Diagnosis not present

## 2018-11-13 DIAGNOSIS — S82491A Other fracture of shaft of right fibula, initial encounter for closed fracture: Secondary | ICD-10-CM | POA: Diagnosis not present

## 2018-11-13 DIAGNOSIS — Z833 Family history of diabetes mellitus: Secondary | ICD-10-CM | POA: Diagnosis not present

## 2018-11-13 DIAGNOSIS — Z8639 Personal history of other endocrine, nutritional and metabolic disease: Secondary | ICD-10-CM | POA: Diagnosis not present

## 2018-11-13 DIAGNOSIS — E039 Hypothyroidism, unspecified: Secondary | ICD-10-CM | POA: Diagnosis not present

## 2018-11-13 DIAGNOSIS — W19XXXA Unspecified fall, initial encounter: Secondary | ICD-10-CM

## 2018-11-13 DIAGNOSIS — Y92015 Private garage of single-family (private) house as the place of occurrence of the external cause: Secondary | ICD-10-CM

## 2018-11-13 DIAGNOSIS — Z09 Encounter for follow-up examination after completed treatment for conditions other than malignant neoplasm: Secondary | ICD-10-CM

## 2018-11-13 DIAGNOSIS — Z79899 Other long term (current) drug therapy: Secondary | ICD-10-CM | POA: Diagnosis not present

## 2018-11-13 DIAGNOSIS — S82141A Displaced bicondylar fracture of right tibia, initial encounter for closed fracture: Principal | ICD-10-CM | POA: Diagnosis present

## 2018-11-13 DIAGNOSIS — Z8041 Family history of malignant neoplasm of ovary: Secondary | ICD-10-CM

## 2018-11-13 DIAGNOSIS — Z01811 Encounter for preprocedural respiratory examination: Secondary | ICD-10-CM | POA: Diagnosis present

## 2018-11-13 DIAGNOSIS — Z981 Arthrodesis status: Secondary | ICD-10-CM

## 2018-11-13 DIAGNOSIS — R2689 Other abnormalities of gait and mobility: Secondary | ICD-10-CM | POA: Diagnosis not present

## 2018-11-13 DIAGNOSIS — F419 Anxiety disorder, unspecified: Secondary | ICD-10-CM | POA: Diagnosis not present

## 2018-11-13 DIAGNOSIS — F32A Depression, unspecified: Secondary | ICD-10-CM | POA: Diagnosis present

## 2018-11-13 DIAGNOSIS — F418 Other specified anxiety disorders: Secondary | ICD-10-CM | POA: Diagnosis present

## 2018-11-13 DIAGNOSIS — D509 Iron deficiency anemia, unspecified: Secondary | ICD-10-CM | POA: Diagnosis not present

## 2018-11-13 DIAGNOSIS — S82101K Unspecified fracture of upper end of right tibia, subsequent encounter for closed fracture with nonunion: Secondary | ICD-10-CM | POA: Diagnosis not present

## 2018-11-13 DIAGNOSIS — S82101D Unspecified fracture of upper end of right tibia, subsequent encounter for closed fracture with routine healing: Secondary | ICD-10-CM | POA: Diagnosis not present

## 2018-11-13 DIAGNOSIS — D72829 Elevated white blood cell count, unspecified: Secondary | ICD-10-CM | POA: Diagnosis not present

## 2018-11-13 DIAGNOSIS — R4182 Altered mental status, unspecified: Secondary | ICD-10-CM | POA: Diagnosis not present

## 2018-11-13 DIAGNOSIS — S82191S Other fracture of upper end of right tibia, sequela: Secondary | ICD-10-CM | POA: Diagnosis not present

## 2018-11-13 DIAGNOSIS — M25571 Pain in right ankle and joints of right foot: Secondary | ICD-10-CM | POA: Diagnosis not present

## 2018-11-13 DIAGNOSIS — S82141D Displaced bicondylar fracture of right tibia, subsequent encounter for closed fracture with routine healing: Secondary | ICD-10-CM | POA: Diagnosis not present

## 2018-11-13 DIAGNOSIS — Z4789 Encounter for other orthopedic aftercare: Secondary | ICD-10-CM | POA: Diagnosis not present

## 2018-11-13 DIAGNOSIS — M6281 Muscle weakness (generalized): Secondary | ICD-10-CM | POA: Diagnosis not present

## 2018-11-13 DIAGNOSIS — S2232XA Fracture of one rib, left side, initial encounter for closed fracture: Secondary | ICD-10-CM | POA: Diagnosis not present

## 2018-11-13 DIAGNOSIS — S82831A Other fracture of upper and lower end of right fibula, initial encounter for closed fracture: Secondary | ICD-10-CM | POA: Diagnosis not present

## 2018-11-13 DIAGNOSIS — Z419 Encounter for procedure for purposes other than remedying health state, unspecified: Secondary | ICD-10-CM

## 2018-11-13 LAB — CBC WITH DIFFERENTIAL/PLATELET
Abs Immature Granulocytes: 0.06 10*3/uL (ref 0.00–0.07)
Basophils Absolute: 0 10*3/uL (ref 0.0–0.1)
Basophils Relative: 0 %
Eosinophils Absolute: 0.1 10*3/uL (ref 0.0–0.5)
Eosinophils Relative: 1 %
HCT: 38.1 % (ref 36.0–46.0)
Hemoglobin: 12.4 g/dL (ref 12.0–15.0)
Immature Granulocytes: 0 %
Lymphocytes Relative: 10 %
Lymphs Abs: 1.5 10*3/uL (ref 0.7–4.0)
MCH: 29.5 pg (ref 26.0–34.0)
MCHC: 32.5 g/dL (ref 30.0–36.0)
MCV: 90.5 fL (ref 80.0–100.0)
Monocytes Absolute: 0.8 10*3/uL (ref 0.1–1.0)
Monocytes Relative: 6 %
Neutro Abs: 12.2 10*3/uL — ABNORMAL HIGH (ref 1.7–7.7)
Neutrophils Relative %: 83 %
Platelets: 294 10*3/uL (ref 150–400)
RBC: 4.21 MIL/uL (ref 3.87–5.11)
RDW: 13.2 % (ref 11.5–15.5)
WBC: 14.8 10*3/uL — ABNORMAL HIGH (ref 4.0–10.5)
nRBC: 0 % (ref 0.0–0.2)

## 2018-11-13 LAB — BASIC METABOLIC PANEL
Anion gap: 9 (ref 5–15)
BUN: 12 mg/dL (ref 8–23)
CO2: 22 mmol/L (ref 22–32)
Calcium: 9.2 mg/dL (ref 8.9–10.3)
Chloride: 106 mmol/L (ref 98–111)
Creatinine, Ser: 0.85 mg/dL (ref 0.44–1.00)
GFR calc Af Amer: >60 mL/min (ref >60)
GFR calc non Af Amer: >60 mL/min (ref >60)
Glucose, Bld: 116 mg/dL — ABNORMAL HIGH (ref 70–99)
Potassium: 4 mmol/L (ref 3.5–5.1)
Sodium: 137 mmol/L (ref 135–145)

## 2018-11-13 MED ORDER — HYDROCODONE-ACETAMINOPHEN 5-325 MG PO TABS
1.0000 | ORAL_TABLET | Freq: Four times a day (QID) | ORAL | Status: DC | PRN
  Filled 2018-11-13: qty 2

## 2018-11-13 MED ORDER — ONDANSETRON HCL 4 MG/2ML IJ SOLN
4.0000 mg | Freq: Four times a day (QID) | INTRAMUSCULAR | Status: DC | PRN
  Administered 2018-11-13 – 2018-11-17 (×6): 4 mg via INTRAVENOUS
  Filled 2018-11-13 (×7): qty 2

## 2018-11-13 MED ORDER — MORPHINE SULFATE (PF) 2 MG/ML IV SOLN
0.5000 mg | INTRAVENOUS | Status: DC | PRN
  Administered 2018-11-13 – 2018-11-14 (×3): 1 mg via INTRAVENOUS
  Filled 2018-11-13 (×3): qty 1

## 2018-11-13 MED ORDER — MORPHINE SULFATE (PF) 2 MG/ML IV SOLN: 0.5000 mg | INTRAVENOUS | Status: DC | PRN

## 2018-11-13 MED ORDER — MORPHINE SULFATE (PF) 4 MG/ML IV SOLN
4.0000 mg | Freq: Once | INTRAVENOUS | Status: AC
  Administered 2018-11-13: 4 mg via INTRAVENOUS
  Filled 2018-11-13: qty 1

## 2018-11-13 NOTE — H&P (Signed)
History and Physical    Caitlin Irwin [id number] DOB: 05-Jun-1951 DOA: 11/13/2018  PCP: Levin Erp, MD  Patient coming from: Home  I have personally briefly reviewed patient's old medical records in [place]  Chief Complaint: Leg pain, fall  HPI: Caitlin Irwin is a 68 y.o. female with medical history significant of anxiety and depression, HLD, chronic migraines.  Patient presents tot he ED following mechanical fall down stairs at home.  Was walking dog, tripped, landed on R buttock.  Severe R Knee pain and deformity following fall.   ED Course: R tib/fib fx, neurovascularly intact.  Ortho planning OREF in AM.   Review of Systems: As per HPI otherwise 10 point review of systems negative.   Past Medical History:  Diagnosis Date   Accidental poisoning by aspirin 09/2014   Anemia    Anxiety    Arthritis    "right knee; right shoulder" (09/15/2015)   Ataxia 03/24/2014   Cataract    both eyes   Chronic lower back pain    scoliosis   Depression    GERD (gastroesophageal reflux disease)    H/O hiatal hernia    High cholesterol    History of blood transfusion    "related to OR"   Hypersomnia    IBS (irritable bowel syndrome)    Migraines    "maybe once/week now" (09/15/2015)   Osteoporosis    Other constipation    Parotid tumor    PONV (postoperative nausea and vomiting)    hx of " getting too much" - 1998, slow to take up by body    PUD (peptic ulcer disease)    Trigeminal neuralgia    S/P radiation therapy    Unspecified hypothyroidism     Past Surgical History:  Procedure Laterality Date   ABDOMINAL HYSTERECTOMY     ANKLE FRACTURE SURGERY Left    ANTERIOR LUMBAR FUSION  04/2010   L4-5; L5-S1   BACK SURGERY     BRAIN SURGERY     parotid tumor removed    CHOLECYSTECTOMY OPEN     EPIGASTRIC HERNIA REPAIR  09/15/2015   Procedure: OPEN HERNIA REPAIR EPIGASTRIC ADULT;  Surgeon: Greer Pickerel, MD;  Location: [place];  Service:  General;;   ESOPHAGOGASTRODUODENOSCOPY  06/12/2012   Procedure: ESOPHAGOGASTRODUODENOSCOPY (EGD);  Surgeon: Lear Ng, MD;  Location: [facility];  Service: Endoscopy;  Laterality: N/A;   FRACTURE SURGERY     gamma knife for trig neuralgia  12/27/11   HERNIA REPAIR     KNEE CARTILAGE SURGERY Right 1981   "converted from scope to open during the OR; ligament repair"   LAMINECTOMY AND MICRODISCECTOMY LUMBAR SPINE  1980   L4-5   LAPAROSCOPIC EPIGASTRIC HERNIA REPAIR  09/15/2015   open   PAROTID GLAND TUMOR EXCISION     benign   SHOULDER ARTHROSCOPY W/ ROTATOR CUFF REPAIR Right 11-2014   TRIGEMINAL NERVE DECOMPRESSION     to stop migraines and it did not work      reports that she has never smoked. She has never used smokeless tobacco. She reports that she does not drink alcohol or use drugs.  Allergies  Allergen Reactions   Acetaminophen Nausea And Vomiting    Family History  Problem Relation Age of Onset   Ovarian cancer Mother    Migraines Mother    Diabetes Father    Migraines Brother    Heart disease Other    Heart disease Maternal Grandfather  Prior to Admission medications   Medication Sig Start Date End Date Taking? Authorizing Provider  ALPRAZolam Duanne Moron) 1 MG tablet TAKE 1 TABLET TWICE ADAY AS NEEDED FOR ANXIETY 11/22/17   Sater, Nanine Means, MD  estradiol (ESTRACE) 1 MG tablet Take 1 mg by mouth daily.  04/21/13   [provider]  FLUoxetine (PROZAC) 20 MG capsule Take 20 mg by mouth daily.  04/04/17   [provider]  gabapentin (NEURONTIN) 100 MG capsule TAKE 1 CAPSULE BY MOUTH THREE TIMES A DAY 12/22/17   Dohmeier, Asencion Partridge, MD  levothyroxine (SYNTHROID, LEVOTHROID) 75 MCG tablet Take 75 mcg by mouth daily before breakfast.  04/09/17   [provider]  metoCLOPramide (REGLAN) 10 MG tablet Take 1 tablet (10 mg total) by mouth every 6 (six) hours as needed for nausea (nausea/headache). 02/20/18   Julianne Rice, MD    naproxen sodium (ANAPROX) 550 MG tablet Take 550 mg by mouth as needed.  04/16/17   [provider]  ondansetron (ZOFRAN) 4 MG tablet Take 1 tablet (4 mg total) by mouth every 8 (eight) hours as needed for nausea or vomiting. 11/12/15   Shary Decamp, PA-C  OXcarbazepine (TRILEPTAL) 150 MG tablet Take 300 mg by mouth daily.  12/20/17   [provider]  pantoprazole (PROTONIX) 40 MG tablet Take 40 mg by mouth as needed. 02/18/12   [provider]  pilocarpine (SALAGEN) 5 MG tablet TAKE 1/2-1 TABLET BY MOUTH  DAILY AS NEEDED FOR DRY MOUTH 02/17/15   [provider]  spironolactone (ALDACTONE) 100 MG tablet Take 100 mg by mouth daily before breakfast.     [provider]  SUMAtriptan (IMITREX) 100 MG tablet MAY REPEAT IN 2 HOURS IF HEADACHE PERSISTS OR RECURS 04/30/18   Dohmeier, Asencion Partridge, MD  topiramate (TOPAMAX) 25 MG tablet TAKE 2 TABLETS (50 MG TOTAL) BY MOUTH 2 (TWO) TIMES DAILY. 12/15/17   Dohmeier, Asencion Partridge, MD  traMADol (ULTRAM) 50 MG tablet Take 1 tablet (50 mg total) by mouth every 6 (six) hours as needed. 02/20/18   Julianne Rice, MD  traZODone (DESYREL) 50 MG tablet Take 50 mg by mouth as needed. 01/22/18   [provider]  valACYclovir (VALTREX) 500 MG tablet Take 500 mg by mouth daily.    [provider]  eletriptan (RELPAX) 40 MG tablet One tablet by mouth as needed for migraine headache.  If the headache improves and then returns, dose may be repeated after 2 hours have elapsed since first dose (do not exceed 80 mg per day). may repeat in 2 hours if necessary  10/20/11  [provider]  ferrous sulfate 325 (65 FE) MG tablet Take 325 mg by mouth daily with breakfast.  10/20/11  [provider]    Physical Exam: Vitals:   11/13/18 1937 11/13/18 1938 11/13/18 2015  BP: (!) 142/74  133/69  Pulse: 79  80  Resp:  18 16  Temp: 98.3 F (36.8 C)    SpO2: 97%  97%  Weight:  49.9 kg   Height:  4\' 11"  (1.499 m)      Constitutional: NAD, calm, comfortable Eyes: PERRL, lids and conjunctivae normal ENMT: Mucous membranes are moist. Posterior pharynx clear of any exudate or lesions.Normal dentition.  Neck: normal, supple, no masses, no thyromegaly Respiratory: clear to auscultation bilaterally, no wheezing, no crackles. Normal respiratory effort. No accessory muscle use.  Cardiovascular: Regular rate and rhythm, no murmurs / rubs / gallops. No extremity edema. 2+ pedal pulses. No carotid bruits.  Abdomen: no tenderness, no masses palpated. No hepatosplenomegaly. Bowel sounds positive.  Musculoskeletal: RLE in splint, palpable DP, toe movement intact, localized swelling present. Skin: no rashes, lesions, ulcers. No induration Neurologic: CN 2-12 grossly intact. Sensation intact, DTR normal. Strength 5/5 in all 4.  Psychiatric: Normal judgment and insight. Alert and oriented x 3. Normal mood.    Labs on Admission: I have personally reviewed following labs and imaging studies  CBC: Recent Labs  Lab 11/13/18 2019  WBC 14.8*  NEUTROABS 12.2*  HGB 12.4  HCT 38.1  MCV 90.5  PLT 737   Basic Metabolic Panel: Recent Labs  Lab 11/13/18 2019  NA 137  K 4.0  CL 106  CO2 22  GLUCOSE 116*  BUN 12  CREATININE 0.85  CALCIUM 9.2   GFR: Estimated Creatinine Clearance: 42.6 mL/min (by C-G formula based on SCr of 0.85 mg/dL). Liver Function Tests: No results for input(s): AST, ALT, ALKPHOS, BILITOT, PROT, ALBUMIN in the last 168 hours. No results for input(s): LIPASE, AMYLASE in the last 168 hours. No results for input(s): AMMONIA in the last 168 hours. Coagulation Profile: No results for input(s): INR, PROTIME in the last 168 hours. Cardiac Enzymes: No results for input(s): CKTOTAL, CKMB, CKMBINDEX, TROPONINI in the last 168 hours. BNP (last 3 results) No results for input(s): PROBNP in the last 8760 hours. HbA1C: No results for input(s): HGBA1C in the last 72 hours. CBG: No results for  input(s): GLUCAP in the last 168 hours. Lipid Profile: No results for input(s): CHOL, HDL, LDLCALC, TRIG, CHOLHDL, LDLDIRECT in the last 72 hours. Thyroid Function Tests: No results for input(s): TSH, T4TOTAL, FREET4, T3FREE, THYROIDAB in the last 72 hours. Anemia Panel: No results for input(s): VITAMINB12, FOLATE, FERRITIN, TIBC, IRON, RETICCTPCT in the last 72 hours. Urine analysis:    Component Value Date/Time   COLORURINE YELLOW 11/19/2016 2005   APPEARANCEUR CLEAR 11/19/2016 2005   LABSPEC 1.013 11/19/2016 2005   PHURINE 7.0 11/19/2016 2005   GLUCOSEU NEGATIVE 11/19/2016 2005   HGBUR NEGATIVE 11/19/2016 2005   BILIRUBINUR NEGATIVE 11/19/2016 2005   KETONESUR 20 (A) 11/19/2016 2005   PROTEINUR NEGATIVE 11/19/2016 2005   UROBILINOGEN 0.2 08/28/2014 1930   NITRITE NEGATIVE 11/19/2016 2005   LEUKOCYTESUR NEGATIVE 11/19/2016 2005    Radiological Exams on Admission: Dg Chest 1 View  Result Date: 11/13/2018 CLINICAL DATA:  Pt c/o proximal and central right lower leg pain after falling down steps in her home today. Hx of previous right knee surgery to repair ligaments. No chest complaints. No hx of heart or lung problems. Pt is a nonsmoker. Patient and tech wore masks. EXAM: CHEST  1 VIEW COMPARISON:  03/30/2017 FINDINGS: Numerous leads and wires project over the chest. Patient rotated left. Midline trachea. Mild cardiomegaly. Remote posterolateral left eighth rib fracture. No pleural effusion or pneumothorax. Clear lungs. IMPRESSION: No acute cardiopulmonary disease. Electronically Signed   By: Abigail Miyamoto M.D.   On: 11/13/2018 21:13   Dg Knee 2 Views Right  Result Date: 11/13/2018 CLINICAL DATA:  Initial encounter for trauma and pain. EXAM: RIGHT KNEE - 1-2 VIEW COMPARISON:  None. FINDINGS: AP and lateral views. Comminuted proximal tibia and fibular fractures. The proximal tibial fracture may extend to the joint surface, but is suboptimally evaluated. Oblique component extends  approximately 8 cm distal to the tibial plateau. No significant knee joint effusion. There is pretibial soft tissue swelling. Ligament attachment site about the anterior tibia may indicate prior ACL repair. IMPRESSION: Comminuted proximal tibia and  fibular fractures. Consider CT to evaluate for intra-articular extension at the tibial plateau. Electronically Signed   By: Abigail Miyamoto M.D.   On: 11/13/2018 21:16   Dg Tibia/fibula Right  Result Date: 11/13/2018 CLINICAL DATA:  Recent fall with knee pain, initial encounter EXAM: RIGHT TIBIA AND FIBULA - 2 VIEW COMPARISON:  None. FINDINGS: Comminuted proximal fibular fracture is noted as well as comminuted proximal tibial fracture. Mild impaction at the fracture site within the proximal tibia is noted. Postsurgical changes are noted in the proximal tibia as well. Midshaft comminuted fibular fracture is seen. No distal tibial or fibular fracture is noted IMPRESSION: Comminuted proximal and midshaft fibular fractures with comminuted proximal tibial fracture with impaction at the fracture site. Electronically Signed   By: Inez Catalina M.D.   On: 11/13/2018 21:09   Ct Cervical Spine Wo Contrast  Result Date: 11/13/2018 CLINICAL DATA:  Fall.  Pain. EXAM: CT CERVICAL SPINE WITHOUT CONTRAST TECHNIQUE: Multidetector CT imaging of the cervical spine was performed without intravenous contrast. Multiplanar CT image reconstructions were also generated. COMPARISON:  02/20/2018 FINDINGS: Alignment: Spinal visualization through the bottom of T2. Maintenance of vertebral body height and alignment across these levels. Skull base and vertebrae: Left sided craniotomy with similar hyperattenuating left cerebellar rounded structure which could be postoperative. No acute fracture within the cervical spine identified. There is facet arthropathy bilaterally. C1-2 articulation within normal limits on coronal reformats Soft tissues and spinal canal: Prevertebral soft tissues are  unremarkable. Disc levels: Loss of intervertebral disc height at multiple levels, most significant at C6-7. Upper chest: No apical pneumothorax. Other: None. IMPRESSION: Cervical spondylosis, without acute osseous abnormality. Electronically Signed   By: Abigail Miyamoto M.D.   On: 11/13/2018 20:59    EKG: Independently reviewed.  Assessment/Plan Principal Problem:   Closed fracture of proximal end of right tibia and fibula Active Problems:   Hx of migraine headaches   Depression   History of hypothyroidism    1. R tib/fib fx - 1. Hip fx pathway 2. Pain ctrl per pathway 3. NPO after MN 4. CT results pending to eval for possible tibial plateau component 5. Surgery in AM 2. H/o Migraines, depression / anxiety, hypothyroidism - continue home meds once med rec completed  DVT prophylaxis: SCD (left) Code Status: Full Family Communication: No family in room Disposition Plan: TBD Consults called: Dr. Griffin Basil Admission status: Admit to inpatient  Severity of Illness: The appropriate patient status for this patient is INPATIENT. Inpatient status is judged to be reasonable and necessary in order to provide the required intensity of service to ensure the patient's safety. The patient's presenting symptoms, physical exam findings, and initial radiographic and laboratory data in the context of their chronic comorbidities is felt to place them at high risk for further clinical deterioration. Furthermore, it is not anticipated that the patient will be medically stable for discharge from the hospital within 2 midnights of admission. The following factors support the patient status of inpatient.   IP status for surgical repair of R tib/fib.   * I certify that at the point of admission it is my clinical judgment that the patient will require inpatient hospital care spanning beyond 2 midnights from the point of admission due to high intensity of service, high risk for further deterioration and high  frequency of surveillance required.*    Jude Linck M. DO [facility]  How to contact the [facility] Attending or Consulting provider [place] or covering provider during after hours 7P -  7A, for this patient?  1. Check the care team in [facility] and look for a) attending/consulting [facility] provider listed and b) the [facility] team listed 2. Log into [email or web address]  [employer] Physician Scheduling and messaging for groups and whole hospitals  On call and physician scheduling software for group practices, residents, hospitalists and other medical providers for call, clinic, rotation and shift schedules. On[employer] is a hospital-wide system for scheduling doctors and paging doctors on call. EasyPlot is for scientific plotting and data analysis.  [email or web address]  and use [facility] universal password to access. If you do not have the password, please contact the hospital operator.  3. Locate the [facility] provider you are looking for under [facility] and page to a number that you can be directly reached. 4. If you still have difficulty reaching the provider, please page the [facility] (Director on Call) for the Hospitalists listed on amion for assistance.  11/13/2018, 10:09 PM

## 2018-11-13 NOTE — ED Provider Notes (Signed)
[facility] EMERGENCY DEPARTMENT Provider Note   CSN: [id number] Arrival date & time: 11/13/18  1930    History   Chief Complaint Chief Complaint  Patient presents with  . Fall    HPI NAUTICA HOTZ is a 68 y.o. female.     68 year old female with prior medical history as detailed below presents following reported fall.  Patient reports that she lost her balance and missed a step.  She landed hard on the right leg and felt the leg give way.  She complains now of pain to the distal right lower extremity.  She was unable to ambulate after the fall.  She denies head injury or other significant injury.  The history is provided by the patient, medical records and the EMS personnel.  Fall  This is a new problem. The current episode started less than 1 hour ago. The problem occurs constantly. The problem has not changed since onset.Pertinent negatives include no chest pain, no abdominal pain, no headaches and no shortness of breath. Nothing aggravates the symptoms. Nothing relieves the symptoms. She has tried nothing for the symptoms.    Past Medical History:  Diagnosis Date  . Accidental poisoning by aspirin 09/2014  . Anemia   . Anxiety   . Arthritis    "right knee; right shoulder" (09/15/2015)  . Ataxia 03/24/2014  . Cataract    both eyes  . Chronic lower back pain    scoliosis  . Depression   . GERD (gastroesophageal reflux disease)   . H/O hiatal hernia   . High cholesterol   . History of blood transfusion    "related to OR"  . Hypersomnia   . IBS (irritable bowel syndrome)   . Migraines    "maybe once/week now" (09/15/2015)  . Osteoporosis   . Other constipation   . Parotid tumor   . PONV (postoperative nausea and vomiting)    hx of " getting too much" - 1998, slow to take up by body   . PUD (peptic ulcer disease)   . Trigeminal neuralgia    S/P radiation therapy   . Unspecified hypothyroidism     Patient Active Problem List   Diagnosis Date  Noted  . Closed fracture of proximal end of right tibia and fibula 11/13/2018  . Epigastric hernia 09/15/2015  . Migraine, intractable 05/28/2015  . Migraine 05/28/2015  . Headache, migraine   . Insomnia   . Emesis   . History of hypothyroidism   . Microcytic hypochromic anemia   . Salicylate poisoning 28/31/5176  . Migraine headache 08/28/2014  . Hypokalemia 08/28/2014  . Salicylate intoxication 08/28/2014  . Aspirin toxicity   . Ataxia 03/24/2014  . Complicated grief 16/01/3709  . Altered mental status, unspecified altered mental status type 10/13/2013  . Polypharmacy 10/13/2013  . Depression 10/13/2013  . Atypical face pain 02/10/2013  . Esophageal reflux 06/12/2012  . Trigeminal neuralgia 11/07/2011  . Hx of migraine headaches 10/20/2011  . Dysphagia, unspecified(787.20) 09/26/2011  . Weight gain 09/26/2011  . NAUSEA 02/02/2010  . DIARRHEA 02/02/2010  . CHANGE IN BOWELS 02/02/2010  . ULCER-DUODENAL 11/26/2008  . History of peptic ulcer 11/26/2008  . GASTRITIS 11/26/2008  . HYPOTHYROIDISM 10/03/2007  . CONSTIPATION, CHRONIC 10/03/2007  . IRRITABLE BOWEL SYNDROME 10/03/2007  . COLITIS, HX OF 10/03/2007    Past Surgical History:  Procedure Laterality Date  . ABDOMINAL HYSTERECTOMY    . ANKLE FRACTURE SURGERY Left   . ANTERIOR LUMBAR FUSION  04/2010   L4-5;  L5-S1  . BACK SURGERY    . BRAIN SURGERY     parotid tumor removed   . CHOLECYSTECTOMY OPEN    . EPIGASTRIC HERNIA REPAIR  09/15/2015   Procedure: OPEN HERNIA REPAIR EPIGASTRIC ADULT;  Surgeon: Greer Pickerel, MD;  Location: [facility];  Service: General;;  . ESOPHAGOGASTRODUODENOSCOPY  06/12/2012   Procedure: ESOPHAGOGASTRODUODENOSCOPY (EGD);  Surgeon: Lear Ng, MD;  Location: [facility];  Service: Endoscopy;  Laterality: N/A;  . FRACTURE SURGERY    . gamma knife for trig neuralgia  12/27/11  . [place]   "converted from scope to open during the OR; ligament  repair"  . LAMINECTOMY AND MICRODISCECTOMY LUMBAR SPINE  1980   L4-5  . LAPAROSCOPIC EPIGASTRIC HERNIA REPAIR  09/15/2015   open  . PAROTID GLAND TUMOR EXCISION     benign  . SHOULDER ARTHROSCOPY W/ ROTATOR CUFF REPAIR Right 11-2014  . TRIGEMINAL NERVE DECOMPRESSION     to stop migraines and it did not work      OB History   No obstetric history on file.      Home Medications    Prior to Admission medications   Medication Sig Start Date End Date Taking? Authorizing Provider  ALPRAZolam Duanne Moron) 1 MG tablet TAKE 1 TABLET TWICE ADAY AS NEEDED FOR ANXIETY 11/22/17   Sater, Nanine Means, MD  estradiol (ESTRACE) 1 MG tablet Take 1 mg by mouth daily.  04/21/13   [provider]  FLUoxetine (PROZAC) 20 MG capsule Take 20 mg by mouth daily.  04/04/17   [provider]  gabapentin (NEURONTIN) 100 MG capsule TAKE 1 CAPSULE BY MOUTH THREE TIMES A DAY 12/22/17   Dohmeier, Asencion Partridge, MD  levothyroxine (SYNTHROID, LEVOTHROID) 75 MCG tablet Take 75 mcg by mouth daily before breakfast.  04/09/17   [provider]  metoCLOPramide (REGLAN) 10 MG tablet Take 1 tablet (10 mg total) by mouth every 6 (six) hours as needed for nausea (nausea/headache). 02/20/18   Julianne Rice, MD  naproxen sodium (ANAPROX) 550 MG tablet Take 550 mg by mouth as needed.  04/16/17   [provider]  ondansetron (ZOFRAN) 4 MG tablet Take 1 tablet (4 mg total) by mouth every 8 (eight) hours as needed for nausea or vomiting. 11/12/15   Shary Decamp, PA-C  OXcarbazepine (TRILEPTAL) 150 MG tablet Take 300 mg by mouth daily.  12/20/17   [provider]  pantoprazole (PROTONIX) 40 MG tablet Take 40 mg by mouth as needed. 02/18/12   [provider]  pilocarpine (SALAGEN) 5 MG tablet TAKE 1/2-1 TABLET BY MOUTH  DAILY AS NEEDED FOR DRY MOUTH 02/17/15   [provider]  spironolactone (ALDACTONE) 100 MG tablet Take 100 mg by mouth daily before breakfast.     [provider]   SUMAtriptan (IMITREX) 100 MG tablet MAY REPEAT IN 2 HOURS IF HEADACHE PERSISTS OR RECURS 04/30/18   Dohmeier, Asencion Partridge, MD  topiramate (TOPAMAX) 25 MG tablet TAKE 2 TABLETS (50 MG TOTAL) BY MOUTH 2 (TWO) TIMES DAILY. 12/15/17   Dohmeier, Asencion Partridge, MD  traMADol (ULTRAM) 50 MG tablet Take 1 tablet (50 mg total) by mouth every 6 (six) hours as needed. 02/20/18   Julianne Rice, MD  traZODone (DESYREL) 50 MG tablet Take 50 mg by mouth as needed. 01/22/18   [provider]  valACYclovir (VALTREX) 500 MG tablet Take 500 mg by mouth daily.    [provider]  eletriptan (RELPAX) 40 MG  tablet One tablet by mouth as needed for migraine headache.  If the headache improves and then returns, dose may be repeated after 2 hours have elapsed since first dose (do not exceed 80 mg per day). may repeat in 2 hours if necessary  10/20/11  [provider]  ferrous sulfate 325 (65 FE) MG tablet Take 325 mg by mouth daily with breakfast.  10/20/11  [provider]    Family History Family History  Problem Relation Age of Onset  . Ovarian cancer Mother   . Migraines Mother   . Diabetes Father   . Migraines Brother   . Heart disease Other   . Heart disease Maternal Grandfather     Social History Social History   Tobacco Use  . Smoking status: Never Smoker  . Smokeless tobacco: Never Used  Substance Use Topics  . Alcohol use: No  . Drug use: No     Allergies   Acetaminophen   Review of Systems Review of Systems  Respiratory: Negative for shortness of breath.   Cardiovascular: Negative for chest pain.  Gastrointestinal: Negative for abdominal pain.  Neurological: Negative for headaches.  All other systems reviewed and are negative.    Physical Exam Updated Vital Signs BP 133/69   Pulse 80   Temp 98.3 F (36.8 C)   Resp 16   Ht 4\' 11"  (1.499 m)   Wt 49.9 kg   SpO2 97%   BMI 22.22 kg/m   Physical Exam Vitals signs and nursing note reviewed.   Constitutional:      General: She is not in acute distress.    Appearance: Normal appearance. She is well-developed.  HENT:     Head: Normocephalic and atraumatic.  Eyes:     Conjunctiva/sclera: Conjunctivae normal.     Pupils: Pupils are equal, round, and reactive to light.  Neck:     Musculoskeletal: Normal range of motion and neck supple.  Cardiovascular:     Rate and Rhythm: Normal rate and regular rhythm.     Heart sounds: Normal heart sounds.  Pulmonary:     Effort: Pulmonary effort is normal. No respiratory distress.     Breath sounds: Normal breath sounds.  Abdominal:     General: There is no distension.     Palpations: Abdomen is soft.     Tenderness: There is no abdominal tenderness.  Musculoskeletal: Normal range of motion.        General: Swelling, tenderness and deformity present.     Comments: Diffuse tenderness and swelling to the right proximal tibia.  Distal right lower extremity is neurovascularly intact.  Dorsalis pedis pulses 2+  Skin:    General: Skin is warm and dry.  Neurological:     Mental Status: She is alert and oriented to person, place, and time.      ED Treatments / Results  Labs (all labs ordered are listed, but only abnormal results are displayed) Labs Reviewed  BASIC METABOLIC PANEL - Abnormal; Notable for the following components:      Result Value   Glucose, Bld 116 (*)    All other components within normal limits  CBC WITH DIFFERENTIAL/PLATELET - Abnormal; Notable for the following components:   WBC 14.8 (*)    Neutro Abs 12.2 (*)    All other components within normal limits  HIV ANTIBODY (ROUTINE TESTING W REFLEX)  TYPE AND SCREEN    EKG None  Radiology Dg Chest 1 View  Result Date: 11/13/2018 CLINICAL DATA:  Pt  c/o proximal and central right lower leg pain after falling down steps in her home today. Hx of previous right knee surgery to repair ligaments. No chest complaints. No hx of heart or lung problems. Pt is a  nonsmoker. Patient and tech wore masks. EXAM: CHEST  1 VIEW COMPARISON:  03/30/2017 FINDINGS: Numerous leads and wires project over the chest. Patient rotated left. Midline trachea. Mild cardiomegaly. Remote posterolateral left eighth rib fracture. No pleural effusion or pneumothorax. Clear lungs. IMPRESSION: No acute cardiopulmonary disease. Electronically Signed   By: Abigail Miyamoto M.D.   On: 11/13/2018 21:13   Dg Knee 2 Views Right  Result Date: 11/13/2018 CLINICAL DATA:  Initial encounter for trauma and pain. EXAM: RIGHT KNEE - 1-2 VIEW COMPARISON:  None. FINDINGS: AP and lateral views. Comminuted proximal tibia and fibular fractures. The proximal tibial fracture may extend to the joint surface, but is suboptimally evaluated. Oblique component extends approximately 8 cm distal to the tibial plateau. No significant knee joint effusion. There is pretibial soft tissue swelling. Ligament attachment site about the anterior tibia may indicate prior ACL repair. IMPRESSION: Comminuted proximal tibia and fibular fractures. Consider CT to evaluate for intra-articular extension at the tibial plateau. Electronically Signed   By: Abigail Miyamoto M.D.   On: 11/13/2018 21:16   Dg Tibia/fibula Right  Result Date: 11/13/2018 CLINICAL DATA:  Recent fall with knee pain, initial encounter EXAM: RIGHT TIBIA AND FIBULA - 2 VIEW COMPARISON:  None. FINDINGS: Comminuted proximal fibular fracture is noted as well as comminuted proximal tibial fracture. Mild impaction at the fracture site within the proximal tibia is noted. Postsurgical changes are noted in the proximal tibia as well. Midshaft comminuted fibular fracture is seen. No distal tibial or fibular fracture is noted IMPRESSION: Comminuted proximal and midshaft fibular fractures with comminuted proximal tibial fracture with impaction at the fracture site. Electronically Signed   By: Inez Catalina M.D.   On: 11/13/2018 21:09   Ct Cervical Spine Wo Contrast  Result Date:  11/13/2018 CLINICAL DATA:  Fall.  Pain. EXAM: CT CERVICAL SPINE WITHOUT CONTRAST TECHNIQUE: Multidetector CT imaging of the cervical spine was performed without intravenous contrast. Multiplanar CT image reconstructions were also generated. COMPARISON:  02/20/2018 FINDINGS: Alignment: Spinal visualization through the bottom of T2. Maintenance of vertebral body height and alignment across these levels. Skull base and vertebrae: Left sided craniotomy with similar hyperattenuating left cerebellar rounded structure which could be postoperative. No acute fracture within the cervical spine identified. There is facet arthropathy bilaterally. C1-2 articulation within normal limits on coronal reformats Soft tissues and spinal canal: Prevertebral soft tissues are unremarkable. Disc levels: Loss of intervertebral disc height at multiple levels, most significant at C6-7. Upper chest: No apical pneumothorax. Other: None. IMPRESSION: Cervical spondylosis, without acute osseous abnormality. Electronically Signed   By: Abigail Miyamoto M.D.   On: 11/13/2018 20:59    Procedures Procedures (including critical care time)  Medications Ordered in ED Medications  HYDROcodone-acetaminophen (NORCO/VICODIN) 5-325 MG per tablet 1-2 tablet (has no administration in time range)  morphine 2 MG/ML injection 0.5 mg (has no administration in time range)  morphine 4 MG/ML injection 4 mg (4 mg Intravenous Given 11/13/18 2017)     Initial Impression / Assessment and Plan / ED Course  I have reviewed the triage vital signs and the nursing notes.  Pertinent labs & imaging results that were available during my care of the patient were reviewed by me and considered in my medical decision making (see chart for  details).        MDM  Screen complete  Patient is presenting for evaluation following reported mechanical fall.  She has a fracture of the right tibia and fibula.  No other significant injury identified.   Orthopedics -Dr.  Griffin Basil -is aware of case.  Patient will require admission for pain control pending operative intervention tomorrow.  Hospitalist service Alcario Drought) is aware of  case and will evaluate for admission.    Final Clinical Impressions(s) / ED Diagnoses   Final diagnoses:  Fall, initial encounter  Other closed fracture of proximal end of right tibia, initial encounter  Other closed fracture of shaft of right fibula, initial encounter    ED Discharge Orders    None       Valarie Merino, MD 11/13/18 2143

## 2018-11-13 NOTE — ED Notes (Signed)
ED TO INPATIENT HANDOFF REPORT  ED Nurse Name and Phone #: Caprice Kluver [phone]  S Name/Age/Gender Caitlin Irwin 68 y.o. female Room/Bed: 027C/027C  Code Status   Code Status: Full Code  Home/SNF/Other Home Patient oriented to: self, place, time and situation Is this baseline? Yes   Triage Complete: Triage complete  Chief Complaint fall  Triage Note Pt reports that she was bringing her dog back in the house and while walking down 2 steps leading into the garage she tripped and landed on the rt side of her buttocks. Per EMS pt reports she felt her knee twist. Pt denies LOC, neck or back pain. EMS reports good distal fxn before and after splint was applied. EMS placed C Collar and reports pt has full sensation. Pt was given 259mcg of fentanyl PTA. Pt is AOx4.   Allergies Allergies  Allergen Reactions  . Acetaminophen Nausea And Vomiting    Level of Care/Admitting Diagnosis ED Disposition    ED Disposition Condition Comment   Admit  Hospital Area: [facility] [[id number]]  Level of Care: Med-Surg [16]  Diagnosis: Closed fracture of proximal end of right tibia and fibula [[id number]]  Admitting Physician: Etta Quill [[id number]]  Attending Physician: Etta Quill [[id number]]  Estimated length of stay: past midnight tomorrow  Certification:: I certify this patient will need inpatient services for at least 2 midnights  Possible Covid Disease Patient Isolation: N/A  PT Class (Do Not Modify): Inpatient [101]  PT Acc Code (Do Not Modify): Private [1]       B Medical/Surgery History Past Medical History:  Diagnosis Date  . Accidental poisoning by aspirin 09/2014  . Anemia   . Anxiety   . Arthritis    "right knee; right shoulder" (09/15/2015)  . Ataxia 03/24/2014  . Cataract    both eyes  . Chronic lower back pain    scoliosis  . Depression   . GERD (gastroesophageal reflux disease)   . H/O hiatal hernia   . High cholesterol   . History of blood  transfusion    "related to OR"  . Hypersomnia   . IBS (irritable bowel syndrome)   . Migraines    "maybe once/week now" (09/15/2015)  . Osteoporosis   . Other constipation   . Parotid tumor   . PONV (postoperative nausea and vomiting)    hx of " getting too much" - 1998, slow to take up by body   . PUD (peptic ulcer disease)   . Trigeminal neuralgia    S/P radiation therapy   . Unspecified hypothyroidism    Past Surgical History:  Procedure Laterality Date  . ABDOMINAL HYSTERECTOMY    . ANKLE FRACTURE SURGERY Left   . ANTERIOR LUMBAR FUSION  04/2010   L4-5; L5-S1  . BACK SURGERY    . BRAIN SURGERY     parotid tumor removed   . CHOLECYSTECTOMY OPEN    . EPIGASTRIC HERNIA REPAIR  09/15/2015   Procedure: OPEN HERNIA REPAIR EPIGASTRIC ADULT;  Surgeon: Greer Pickerel, MD;  Location: [facility];  Service: General;;  . ESOPHAGOGASTRODUODENOSCOPY  06/12/2012   Procedure: ESOPHAGOGASTRODUODENOSCOPY (EGD);  Surgeon: Lear Ng, MD;  Location: [facility];  Service: Endoscopy;  Laterality: N/A;  . FRACTURE SURGERY    . gamma knife for trig neuralgia  12/27/11  . [place]   "converted from scope to open during the OR; ligament repair"  . LAMINECTOMY AND MICRODISCECTOMY LUMBAR SPINE  1980   L4-5  . LAPAROSCOPIC EPIGASTRIC HERNIA REPAIR  09/15/2015   open  . PAROTID GLAND TUMOR EXCISION     benign  . SHOULDER ARTHROSCOPY W/ ROTATOR CUFF REPAIR Right 11-2014  . TRIGEMINAL NERVE DECOMPRESSION     to stop migraines and it did not work      A IV Location/Drains/Wounds Patient Lines/Drains/Airways Status   Active Line/Drains/Airways    Name:   Placement date:   Placement time:   Site:   Days:   Peripheral IV 11/13/18 Left Antecubital   11/13/18    1902    Antecubital   less than 1   Incision (Closed) 09/15/15 Abdomen Other (Comment)   09/15/15    1344     1155   Wound Abrasion(s) Eye Right Bruising to right eye.    -    -    Eye              Intake/Output Last 24 hours No intake or output data in the 24 hours ending 11/13/18 2327  Labs/Imaging Results for orders placed or performed during the hospital encounter of 11/13/18 (from the past 48 hour(s))  Basic metabolic panel     Status: Abnormal   Collection Time: 11/13/18  8:19 PM  Result Value Ref Range   Sodium 137 135 - 145 mmol/L   Potassium 4.0 3.5 - 5.1 mmol/L   Chloride 106 98 - 111 mmol/L   CO2 22 22 - 32 mmol/L   Glucose, Bld 116 (H) 70 - 99 mg/dL   BUN 12 8 - 23 mg/dL   Creatinine, Ser 0.85 0.44 - 1.00 mg/dL   Calcium 9.2 8.9 - 10.3 mg/dL   GFR calc non Af Amer >60 >60 mL/min   GFR calc Af Amer >60 >60 mL/min   Anion gap 9 5 - 15    Comment: Performed at [facility], [place] [address], [place], [place] [address]  CBC with Differential     Status: Abnormal   Collection Time: 11/13/18  8:19 PM  Result Value Ref Range   WBC 14.8 (H) 4.0 - 10.5 K/uL   RBC 4.21 3.87 - 5.11 MIL/uL   Hemoglobin 12.4 12.0 - 15.0 g/dL   HCT 38.1 36.0 - 46.0 %   MCV 90.5 80.0 - 100.0 fL   MCH 29.5 26.0 - 34.0 pg   MCHC 32.5 30.0 - 36.0 g/dL   RDW 13.2 11.5 - 15.5 %   Platelets 294 150 - 400 K/uL   nRBC 0.0 0.0 - 0.2 %   Neutrophils Relative % 83 %   Neutro Abs 12.2 (H) 1.7 - 7.7 K/uL   Lymphocytes Relative 10 %   Lymphs Abs 1.5 0.7 - 4.0 K/uL   Monocytes Relative 6 %   Monocytes Absolute 0.8 0.1 - 1.0 K/uL   Eosinophils Relative 1 %   Eosinophils Absolute 0.1 0.0 - 0.5 K/uL   Basophils Relative 0 %   Basophils Absolute 0.0 0.0 - 0.1 K/uL   Immature Granulocytes 0 %   Abs Immature Granulocytes 0.06 0.00 - 0.07 K/uL    Comment: Performed at [facility] [address] [place], [place] [address]   Dg Chest 1 View  Result Date: 11/13/2018 CLINICAL DATA:  Pt c/o proximal and central right lower leg pain after falling down steps in her home today. Hx of previous right knee surgery to repair ligaments. No chest complaints. No hx of heart or lung problems. Pt is a  nonsmoker. Patient and tech wore masks. EXAM: CHEST  1 VIEW COMPARISON:  03/30/2017 FINDINGS: Numerous leads and wires project over the chest. Patient rotated left. Midline trachea. Mild cardiomegaly. Remote posterolateral left eighth rib fracture. No pleural effusion or pneumothorax. Clear lungs. IMPRESSION: No acute cardiopulmonary disease. Electronically Signed   By: Abigail Miyamoto M.D.   On: 11/13/2018 21:13   Dg Knee 2 Views Right  Result Date: 11/13/2018 CLINICAL DATA:  Initial encounter for trauma and pain. EXAM: RIGHT KNEE - 1-2 VIEW COMPARISON:  None. FINDINGS: AP and lateral views. Comminuted proximal tibia and fibular fractures. The proximal tibial fracture may extend to the joint surface, but is suboptimally evaluated. Oblique component extends approximately 8 cm distal to the tibial plateau. No significant knee joint effusion. There is pretibial soft tissue swelling. Ligament attachment site about the anterior tibia may indicate prior ACL repair. IMPRESSION: Comminuted proximal tibia and fibular fractures. Consider CT to evaluate for intra-articular extension at the tibial plateau. Electronically Signed   By: Abigail Miyamoto M.D.   On: 11/13/2018 21:16   Dg Tibia/fibula Right  Result Date: 11/13/2018 CLINICAL DATA:  Recent fall with knee pain, initial encounter EXAM: RIGHT TIBIA AND FIBULA - 2 VIEW COMPARISON:  None. FINDINGS: Comminuted proximal fibular fracture is noted as well as comminuted proximal tibial fracture. Mild impaction at the fracture site within the proximal tibia is noted. Postsurgical changes are noted in the proximal tibia as well. Midshaft comminuted fibular fracture is seen. No distal tibial or fibular fracture is noted IMPRESSION: Comminuted proximal and midshaft fibular fractures with comminuted proximal tibial fracture with impaction at the fracture site. Electronically Signed   By: Inez Catalina M.D.   On: 11/13/2018 21:09   Ct Cervical Spine Wo Contrast  Result Date:  11/13/2018 CLINICAL DATA:  Fall.  Pain. EXAM: CT CERVICAL SPINE WITHOUT CONTRAST TECHNIQUE: Multidetector CT imaging of the cervical spine was performed without intravenous contrast. Multiplanar CT image reconstructions were also generated. COMPARISON:  02/20/2018 FINDINGS: Alignment: Spinal visualization through the bottom of T2. Maintenance of vertebral body height and alignment across these levels. Skull base and vertebrae: Left sided craniotomy with similar hyperattenuating left cerebellar rounded structure which could be postoperative. No acute fracture within the cervical spine identified. There is facet arthropathy bilaterally. C1-2 articulation within normal limits on coronal reformats Soft tissues and spinal canal: Prevertebral soft tissues are unremarkable. Disc levels: Loss of intervertebral disc height at multiple levels, most significant at C6-7. Upper chest: No apical pneumothorax. Other: None. IMPRESSION: Cervical spondylosis, without acute osseous abnormality. Electronically Signed   By: Abigail Miyamoto M.D.   On: 11/13/2018 20:59   Ct Knee Right Wo Contrast  Result Date: 11/13/2018 CLINICAL DATA:  Proximal tibial and fibular fractures, evaluate for possible articular involvement EXAM: CT OF THE RIGHT KNEE WITHOUT CONTRAST TECHNIQUE: Multidetector CT imaging of the right knee was performed according to the standard protocol. Multiplanar CT image reconstructions were also generated. COMPARISON:  None. FINDINGS: Bones/Joint/Cartilage Comminuted fracture of the proximal fibula is noted with mild impaction at the fracture site. Additionally significantly comminuted and impacted fracture of the proximal tibial metaphysis is noted. The fracture fragments extend into the proximal diaphysis as well. There is impaction of the posterolateral aspect of the tibia superiorly into the region of the tibial plateau although no definitive articular involvement is seen. Changes of prior ligament repair are noted with  fixation staple in the anterior aspect of the tibia. Subchondral sclerosis is noted in the lateral tibial plateau  as well as some mild degenerative changes of the knee joint. Distal femur is within normal limits. Midshaft fibular fracture is not evaluated on this exam. Ligaments Suboptimally assessed by CT.  Cruciate ligaments appear intact. Muscles and Tendons No specific muscular abnormality is noted. No significant muscular hematoma is seen. Soft tissues Changes are noted in the subcutaneous tissues consistent with edema. No significant joint effusion is noted. IMPRESSION: Comminuted proximal tibial and fibular fractures with impaction worse in the proximal tibia with extension of posterior fragments to just below the articular surface within posterior aspect of the tibial plateaus. No definitive intra-articular involvement is seen. Electronically Signed   By: Inez Catalina M.D.   On: 11/13/2018 22:27    Pending Labs Unresulted Labs (From admission, onward)    Start     Ordered   11/13/18 2135  Type and screen [place]  Once,   R    Comments:  [place]    11/13/18 2138   11/13/18 2134  HIV antibody (Routine Testing)  Once,   R     11/13/18 2138          Vitals/Pain Today's Vitals   11/13/18 1937 11/13/18 1938 11/13/18 2015 11/13/18 2236  BP: (!) 142/74  133/69 128/71  Pulse: 79  80 90  Resp:  18 16 17   Temp: 98.3 F (36.8 C)     SpO2: 97%  97% 96%  Weight:  49.9 kg    Height:  4\' 11"  (1.499 m)    PainSc:  9       Isolation Precautions No active isolations  Medications Medications  HYDROcodone-acetaminophen (NORCO/VICODIN) 5-325 MG per tablet 1-2 tablet (has no administration in time range)  morphine 2 MG/ML injection 0.5-1 mg (1 mg Intravenous Given 11/13/18 2244)  ondansetron (ZOFRAN) injection 4 mg (4 mg Intravenous Given 11/13/18 2232)  morphine 4 MG/ML injection 4 mg (4 mg Intravenous Given 11/13/18 2017)     Mobility non-ambulatory Low fall risk   Focused Assessments musculoskeletal   R Recommendations: See Admitting Provider Note  Report given to:   Additional Notes:

## 2018-11-13 NOTE — ED Triage Notes (Signed)
Pt reports that she was bringing her dog back in the house and while walking down 2 steps leading into the garage she tripped and landed on the rt side of her buttocks. Per EMS pt reports she felt her knee twist. Pt denies LOC, neck or back pain. EMS reports good distal fxn before and after splint was applied. EMS placed C Collar and reports pt has full sensation. Pt was given 280mcg of fentanyl PTA. Pt is AOx4.

## 2018-11-14 ENCOUNTER — Encounter (HOSPITAL_COMMUNITY)
Admission: EM | Disposition: A | Discharge status: Skilled Nursing Facility | Payer: Self-pay | Source: Home / Self Care | Attending: Family Medicine

## 2018-11-14 ENCOUNTER — Inpatient Hospital Stay (HOSPITAL_COMMUNITY): Payer: PPO

## 2018-11-14 ENCOUNTER — Inpatient Hospital Stay (HOSPITAL_COMMUNITY): Payer: PPO | Admitting: Certified Registered Nurse Anesthetist

## 2018-11-14 DIAGNOSIS — S82831K Other fracture of upper and lower end of right fibula, subsequent encounter for closed fracture with nonunion: Secondary | ICD-10-CM

## 2018-11-14 DIAGNOSIS — S82101K Unspecified fracture of upper end of right tibia, subsequent encounter for closed fracture with nonunion: Secondary | ICD-10-CM

## 2018-11-14 DIAGNOSIS — R110 Nausea: Secondary | ICD-10-CM

## 2018-11-14 HISTORY — PX: EXTERNAL FIXATION LEG: SHX1549

## 2018-11-14 LAB — TYPE AND SCREEN
ABO/RH(D): B POS
Antibody Screen: NEGATIVE

## 2018-11-14 LAB — SURGICAL PCR SCREEN
MRSA, PCR: NEGATIVE
Staphylococcus aureus: NEGATIVE

## 2018-11-14 LAB — HIV ANTIBODY (ROUTINE TESTING W REFLEX): HIV Screen 4th Generation wRfx: NONREACTIVE

## 2018-11-14 SURGERY — EXTERNAL FIXATION, LOWER EXTREMITY
Anesthesia: General | Site: Leg Lower | Laterality: Right

## 2018-11-14 MED ORDER — ENOXAPARIN SODIUM 40 MG/0.4ML ~~LOC~~ SOLN
40.0000 mg | Freq: Once | SUBCUTANEOUS | Status: AC
  Administered 2018-11-15: 40 mg via SUBCUTANEOUS
  Filled 2018-11-14: qty 0.4

## 2018-11-14 MED ORDER — HYDROMORPHONE HCL 1 MG/ML IJ SOLN
0.5000 mg | INTRAMUSCULAR | Status: DC | PRN
  Administered 2018-11-15 – 2018-11-17 (×4): 1 mg via INTRAVENOUS
  Filled 2018-11-14 (×4): qty 1

## 2018-11-14 MED ORDER — DEXAMETHASONE SODIUM PHOSPHATE 10 MG/ML IJ SOLN
INTRAMUSCULAR | Status: DC | PRN
  Administered 2018-11-14: 4 mg via INTRAVENOUS

## 2018-11-14 MED ORDER — ROCURONIUM BROMIDE 50 MG/5ML IV SOSY
PREFILLED_SYRINGE | INTRAVENOUS | Status: AC
  Filled 2018-11-14: qty 5

## 2018-11-14 MED ORDER — ONDANSETRON HCL 4 MG/2ML IJ SOLN
INTRAMUSCULAR | Status: DC | PRN
  Administered 2018-11-14: 4 mg via INTRAVENOUS

## 2018-11-14 MED ORDER — ACETAMINOPHEN 500 MG PO TABS
1000.0000 mg | ORAL_TABLET | Freq: Three times a day (TID) | ORAL | Status: AC
  Administered 2018-11-14: 1000 mg via ORAL
  Filled 2018-11-14 (×5): qty 2

## 2018-11-14 MED ORDER — DEXAMETHASONE SODIUM PHOSPHATE 10 MG/ML IJ SOLN
INTRAMUSCULAR | Status: AC
  Filled 2018-11-14: qty 1

## 2018-11-14 MED ORDER — SUCCINYLCHOLINE CHLORIDE 200 MG/10ML IV SOSY
PREFILLED_SYRINGE | INTRAVENOUS | Status: DC | PRN
  Administered 2018-11-14: 80 mg via INTRAVENOUS

## 2018-11-14 MED ORDER — PROPOFOL 10 MG/ML IV BOLUS
INTRAVENOUS | Status: DC | PRN
  Administered 2018-11-14: 100 mg via INTRAVENOUS

## 2018-11-14 MED ORDER — DOCUSATE SODIUM 100 MG PO CAPS
100.0000 mg | ORAL_CAPSULE | Freq: Two times a day (BID) | ORAL | Status: DC
  Administered 2018-11-14 – 2018-11-15 (×2): 100 mg via ORAL
  Filled 2018-11-14 (×2): qty 1

## 2018-11-14 MED ORDER — SUCCINYLCHOLINE CHLORIDE 200 MG/10ML IV SOSY
PREFILLED_SYRINGE | INTRAVENOUS | Status: AC
  Filled 2018-11-14: qty 10

## 2018-11-14 MED ORDER — MIDAZOLAM HCL 2 MG/2ML IJ SOLN
INTRAMUSCULAR | Status: DC | PRN
  Administered 2018-11-14: 2 mg via INTRAVENOUS

## 2018-11-14 MED ORDER — ALPRAZOLAM 0.5 MG PO TABS
1.0000 mg | ORAL_TABLET | Freq: Once | ORAL | Status: AC
  Administered 2018-11-14: 1 mg via ORAL
  Filled 2018-11-14: qty 2

## 2018-11-14 MED ORDER — SCOPOLAMINE 1 MG/3DAYS TD PT72
MEDICATED_PATCH | TRANSDERMAL | Status: AC
  Filled 2018-11-14: qty 1

## 2018-11-14 MED ORDER — FENTANYL CITRATE (PF) 100 MCG/2ML IJ SOLN
INTRAMUSCULAR | Status: DC | PRN
  Administered 2018-11-14: 25 ug via INTRAVENOUS
  Administered 2018-11-14 (×2): 50 ug via INTRAVENOUS
  Administered 2018-11-14: 125 ug via INTRAVENOUS

## 2018-11-14 MED ORDER — PHENOL 1.4 % MT LIQD: 1.0000 | OROMUCOSAL | Status: DC | PRN

## 2018-11-14 MED ORDER — CEFAZOLIN SODIUM-DEXTROSE 2-4 GM/100ML-% IV SOLN
2.0000 g | Freq: Four times a day (QID) | INTRAVENOUS | Status: AC
  Administered 2018-11-14 (×2): 2 g via INTRAVENOUS
  Filled 2018-11-14 (×3): qty 100

## 2018-11-14 MED ORDER — CHLORHEXIDINE GLUCONATE 4 % EX LIQD: 60.0000 mL | Freq: Once | CUTANEOUS | Status: DC

## 2018-11-14 MED ORDER — FENTANYL CITRATE (PF) 100 MCG/2ML IJ SOLN
25.0000 ug | INTRAMUSCULAR | Status: DC | PRN
  Administered 2018-11-14: 50 ug via INTRAVENOUS

## 2018-11-14 MED ORDER — LACTATED RINGERS IV SOLN
INTRAVENOUS | Status: DC
  Administered 2018-11-14 – 2018-11-16 (×3): via INTRAVENOUS

## 2018-11-14 MED ORDER — SUMATRIPTAN SUCCINATE 100 MG PO TABS
100.0000 mg | ORAL_TABLET | ORAL | Status: AC | PRN
  Administered 2018-11-14 – 2018-11-15 (×2): 100 mg via ORAL
  Filled 2018-11-14 (×3): qty 1

## 2018-11-14 MED ORDER — METOCLOPRAMIDE HCL 5 MG PO TABS
5.0000 mg | ORAL_TABLET | Freq: Three times a day (TID) | ORAL | Status: DC | PRN
  Filled 2018-11-14: qty 2

## 2018-11-14 MED ORDER — MORPHINE SULFATE (PF) 2 MG/ML IV SOLN
2.0000 mg | Freq: Once | INTRAVENOUS | Status: AC
  Administered 2018-11-14: 2 mg via INTRAVENOUS
  Filled 2018-11-14: qty 1

## 2018-11-14 MED ORDER — MIDAZOLAM HCL 2 MG/2ML IJ SOLN
INTRAMUSCULAR | Status: AC
  Filled 2018-11-14: qty 2

## 2018-11-14 MED ORDER — ONDANSETRON HCL 4 MG/2ML IJ SOLN
INTRAMUSCULAR | Status: AC
  Filled 2018-11-14: qty 2

## 2018-11-14 MED ORDER — SCOPOLAMINE 1 MG/3DAYS TD PT72
1.0000 | MEDICATED_PATCH | TRANSDERMAL | Status: DC
  Administered 2018-11-14: 12:00:00 1.5 mg via TRANSDERMAL

## 2018-11-14 MED ORDER — ROCURONIUM BROMIDE 10 MG/ML (PF) SYRINGE
PREFILLED_SYRINGE | INTRAVENOUS | Status: DC | PRN
  Administered 2018-11-14: 10 mg via INTRAVENOUS
  Administered 2018-11-14: 30 mg via INTRAVENOUS

## 2018-11-14 MED ORDER — FENTANYL CITRATE (PF) 250 MCG/5ML IJ SOLN
INTRAMUSCULAR | Status: AC
  Filled 2018-11-14: qty 5

## 2018-11-14 MED ORDER — PHENYLEPHRINE 40 MCG/ML (10ML) SYRINGE FOR IV PUSH (FOR BLOOD PRESSURE SUPPORT)
PREFILLED_SYRINGE | INTRAVENOUS | Status: DC | PRN
  Administered 2018-11-14: 80 ug via INTRAVENOUS

## 2018-11-14 MED ORDER — LIDOCAINE 2% (20 MG/ML) 5 ML SYRINGE
INTRAMUSCULAR | Status: AC
  Filled 2018-11-14: qty 5

## 2018-11-14 MED ORDER — PROMETHAZINE HCL 25 MG/ML IJ SOLN
6.2500 mg | INTRAMUSCULAR | Status: DC | PRN
  Administered 2018-11-14: 13:00:00 6.25 mg via INTRAVENOUS

## 2018-11-14 MED ORDER — MENTHOL 3 MG MT LOZG: 1.0000 | LOZENGE | OROMUCOSAL | Status: DC | PRN

## 2018-11-14 MED ORDER — OXYCODONE HCL 5 MG PO TABS
5.0000 mg | ORAL_TABLET | ORAL | Status: DC | PRN
  Administered 2018-11-14 – 2018-11-17 (×8): 10 mg via ORAL
  Administered 2018-11-18: 5 mg via ORAL
  Administered 2018-11-18 – 2018-11-20 (×3): 10 mg via ORAL
  Filled 2018-11-14 (×11): qty 2
  Filled 2018-11-14: qty 1

## 2018-11-14 MED ORDER — CEFAZOLIN SODIUM-DEXTROSE 2-4 GM/100ML-% IV SOLN
2.0000 g | INTRAVENOUS | Status: AC
  Administered 2018-11-14: 2 g via INTRAVENOUS
  Filled 2018-11-14: qty 100

## 2018-11-14 MED ORDER — MORPHINE SULFATE (PF) 2 MG/ML IV SOLN
2.0000 mg | INTRAVENOUS | Status: DC | PRN
  Administered 2018-11-14: 2 mg via INTRAVENOUS
  Filled 2018-11-14: qty 1

## 2018-11-14 MED ORDER — SUGAMMADEX SODIUM 200 MG/2ML IV SOLN
INTRAVENOUS | Status: DC | PRN
  Administered 2018-11-14: 100 mg via INTRAVENOUS

## 2018-11-14 MED ORDER — PROMETHAZINE HCL 25 MG/ML IJ SOLN
INTRAMUSCULAR | Status: AC
  Filled 2018-11-14: qty 1

## 2018-11-14 MED ORDER — 0.9 % SODIUM CHLORIDE (POUR BTL) OPTIME
TOPICAL | Status: DC | PRN
  Administered 2018-11-14: 1000 mL

## 2018-11-14 MED ORDER — FENTANYL CITRATE (PF) 100 MCG/2ML IJ SOLN
INTRAMUSCULAR | Status: AC
  Filled 2018-11-14: qty 2

## 2018-11-14 MED ORDER — LIDOCAINE 2% (20 MG/ML) 5 ML SYRINGE
INTRAMUSCULAR | Status: DC | PRN
  Administered 2018-11-14: 40 mg via INTRAVENOUS

## 2018-11-14 MED ORDER — METOCLOPRAMIDE HCL 5 MG/ML IJ SOLN
5.0000 mg | Freq: Three times a day (TID) | INTRAMUSCULAR | Status: DC | PRN
  Administered 2018-11-14: 17:00:00 10 mg via INTRAVENOUS
  Filled 2018-11-14: qty 2

## 2018-11-14 SURGICAL SUPPLY — 51 items
BANDAGE ACE 4X5 VEL STRL LF (GAUZE/BANDAGES/DRESSINGS) ×2 IMPLANT
BANDAGE ACE 6X5 VEL STRL LF (GAUZE/BANDAGES/DRESSINGS) ×2 IMPLANT
BAR EXFX 500X11 NS LF (EXFIX) ×2
BAR GLASS FIBER EXFX 11X500 (EXFIX) ×2 IMPLANT
BNDG CMPR MED 10X6 ELC LF (GAUZE/BANDAGES/DRESSINGS) ×1
BNDG COHESIVE 4X5 TAN STRL (GAUZE/BANDAGES/DRESSINGS) ×2 IMPLANT
BNDG ELASTIC 6X10 VLCR STRL LF (GAUZE/BANDAGES/DRESSINGS) ×1 IMPLANT
BNDG GAUZE ELAST 4 BULKY (GAUZE/BANDAGES/DRESSINGS) ×4 IMPLANT
BRUSH SCRUB SURG 4.25 DISP (MISCELLANEOUS) ×2 IMPLANT
CHLORAPREP W/TINT 26ML (MISCELLANEOUS) ×2 IMPLANT
CLAMP PIN 2 BAR 45MM EXFIX (EXFIX) ×1 IMPLANT
COVER SURGICAL LIGHT HANDLE (MISCELLANEOUS) ×4 IMPLANT
COVER WAND RF STERILE (DRAPES) ×2 IMPLANT
DRAPE C-ARM 42X72 X-RAY (DRAPES) IMPLANT
DRAPE C-ARMOR (DRAPES) ×2 IMPLANT
DRAPE IMP U-DRAPE 54X76 (DRAPES) ×4 IMPLANT
DRAPE ORTHO SPLIT 77X108 STRL (DRAPES) ×4
DRAPE SURG ORHT 6 SPLT 77X108 (DRAPES) ×2 IMPLANT
DRAPE U-SHAPE 47X51 STRL (DRAPES) ×2 IMPLANT
DRSG MEPITEL 4X7.2 (GAUZE/BANDAGES/DRESSINGS) IMPLANT
ELECT REM PT RETURN 9FT ADLT (ELECTROSURGICAL) ×2
ELECTRODE REM PT RTRN 9FT ADLT (ELECTROSURGICAL) ×1 IMPLANT
GAUZE SPONGE 4X4 12PLY STRL (GAUZE/BANDAGES/DRESSINGS) ×2 IMPLANT
GAUZE XEROFORM 1X8 LF (GAUZE/BANDAGES/DRESSINGS) ×2 IMPLANT
GLOVE BIO SURGEON STRL SZ 6.5 (GLOVE) ×9 IMPLANT
GLOVE BIO SURGEON STRL SZ7.5 (GLOVE) ×8 IMPLANT
GLOVE BIOGEL PI IND STRL 6.5 (GLOVE) ×1 IMPLANT
GLOVE BIOGEL PI IND STRL 7.5 (GLOVE) ×1 IMPLANT
GLOVE BIOGEL PI INDICATOR 6.5 (GLOVE) ×2
GLOVE BIOGEL PI INDICATOR 7.5 (GLOVE) ×1
GOWN STRL REUS W/ TWL LRG LVL3 (GOWN DISPOSABLE) ×2 IMPLANT
GOWN STRL REUS W/TWL LRG LVL3 (GOWN DISPOSABLE) ×4
KIT BASIN OR (CUSTOM PROCEDURE TRAY) ×2 IMPLANT
KIT TURNOVER KIT B (KITS) ×2 IMPLANT
MANIFOLD NEPTUNE II (INSTRUMENTS) ×1 IMPLANT
NEEDLE 22X1 1/2 (OR ONLY) (NEEDLE) IMPLANT
NS IRRIG 1000ML POUR BTL (IV SOLUTION) ×2 IMPLANT
PACK ORTHO EXTREMITY (CUSTOM PROCEDURE TRAY) ×2 IMPLANT
PAD ARMBOARD 7.5X6 YLW CONV (MISCELLANEOUS) ×4 IMPLANT
PADDING CAST COTTON 6X4 STRL (CAST SUPPLIES) ×4 IMPLANT
PIN CLAMP 2BAR 75MM BLUE (EXFIX) ×1 IMPLANT
PIN HALF ORANGE 5X200X45MM (EXFIX) ×2 IMPLANT
PIN HALF YELLOW 5X160X35 (EXFIX) ×2 IMPLANT
SPONGE LAP 18X18 RF (DISPOSABLE) ×2 IMPLANT
STAPLER VISISTAT 35W (STAPLE) ×2 IMPLANT
STOCKINETTE IMPERVIOUS LG (DRAPES) ×1 IMPLANT
STRIP CLOSURE SKIN 1/2X4 (GAUZE/BANDAGES/DRESSINGS) IMPLANT
SUT PROLENE 0 CT (SUTURE) IMPLANT
TOWEL OR 17X24 6PK STRL BLUE (TOWEL DISPOSABLE) ×4 IMPLANT
TOWEL OR 17X26 10 PK STRL BLUE (TOWEL DISPOSABLE) ×4 IMPLANT
UNDERPAD 30X30 (UNDERPADS AND DIAPERS) ×2 IMPLANT

## 2018-11-14 NOTE — Progress Notes (Signed)
Orthopedic Tech Progress Note Patient Details:  Caitlin Irwin 07/30/1951 [id number] Applied Over Head Frame and Trapeze Patient ID: Caitlin Irwin, female   DOB: Nov 09, 1950, 68 y.o.   MRN: [id number]   Caitlin Irwin 11/14/2018, 5:33 PM

## 2018-11-14 NOTE — Consult Note (Signed)
ORTHOPAEDIC CONSULTATION  REQUESTING PHYSICIAN: Annita Brod, MD  Chief Complaint: Right tibial plateau fracture  HPI: Caitlin Irwin is a 68 y.o. female with fall yesterday resulting in a comminuted right tibial plateau and proximal tibial shaft fracture.  Patient reports ambulating without device prior to this.  She has multiple medical comorbidities and was admitted to the medicine service for management with orthopedics consulting on the tibia.  He had significant swelling after injury.  Past Medical History:  Diagnosis Date   Accidental poisoning by aspirin 09/2014   Anemia    Anxiety    Arthritis    "right knee; right shoulder" (09/15/2015)   Ataxia 03/24/2014   Cataract    both eyes   Chronic lower back pain    scoliosis   Depression    GERD (gastroesophageal reflux disease)    H/O hiatal hernia    High cholesterol    History of blood transfusion    "related to OR"   Hypersomnia    IBS (irritable bowel syndrome)    Migraines    "maybe once/week now" (09/15/2015)   Osteoporosis    Other constipation    Parotid tumor    PONV (postoperative nausea and vomiting)    hx of " getting too much" - 1998, slow to take up by body    PUD (peptic ulcer disease)    Trigeminal neuralgia    S/P radiation therapy    Unspecified hypothyroidism    Past Surgical History:  Procedure Laterality Date   ABDOMINAL HYSTERECTOMY     ANKLE FRACTURE SURGERY Left    ANTERIOR LUMBAR FUSION  04/2010   L4-5; L5-S1   BACK SURGERY     BRAIN SURGERY     parotid tumor removed    CHOLECYSTECTOMY OPEN     EPIGASTRIC HERNIA REPAIR  09/15/2015   Procedure: OPEN HERNIA REPAIR EPIGASTRIC ADULT;  Surgeon: Greer Pickerel, MD;  Location: [facility];  Service: General;;   ESOPHAGOGASTRODUODENOSCOPY  06/12/2012   Procedure: ESOPHAGOGASTRODUODENOSCOPY (EGD);  Surgeon: Lear Ng, MD;  Location: [facility];  Service: Endoscopy;  Laterality: N/A;   FRACTURE  SURGERY     gamma knife for trig neuralgia  12/27/11   HERNIA REPAIR     KNEE CARTILAGE SURGERY Right 1981   "converted from scope to open during the OR; ligament repair"   LAMINECTOMY AND MICRODISCECTOMY LUMBAR SPINE  1980   L4-5   LAPAROSCOPIC EPIGASTRIC HERNIA REPAIR  09/15/2015   open   PAROTID GLAND TUMOR EXCISION     benign   SHOULDER ARTHROSCOPY W/ ROTATOR CUFF REPAIR Right 11-2014   TRIGEMINAL NERVE DECOMPRESSION     to stop migraines and it did not work    Social History   Socioeconomic History   Marital status: Widowed    Spouse name: Not on file   Number of children: 0   Years of education: college   Highest education level: Not on file  Occupational History   Occupation: disabled  Social Designer, fashion/clothing strain: Not on file   Food insecurity:    Worry: Not on file    Inability: Not on file   Transportation needs:    Medical: Not on file    Non-medical: Not on file  Tobacco Use   Smoking status: Never Smoker   Smokeless tobacco: Never Used  Substance and Sexual Activity   Alcohol use: No   Drug use: No   Sexual activity: Not Currently  Lifestyle  Physical activity:    Days per week: Not on file    Minutes per session: Not on file   Stress: Not on file  Relationships   Social connections:    Talks on phone: Not on file    Gets together: Not on file    Attends religious service: Not on file    Active member of club or organization: Not on file    Attends meetings of clubs or organizations: Not on file    Relationship status: Not on file  Other Topics Concern   Not on file  Social History Narrative   Patient lives at home alone.    Patient husband past away 07-03-13.   Patient is widowed.   Patient is on disability.    Patient is right handed.   Patient has no children.   Patient is a college grad.   Family History  Problem Relation Age of Onset   Ovarian cancer Mother    Migraines Mother    Diabetes Father     Migraines Brother    Heart disease Other    Heart disease Maternal Grandfather    Allergies  Allergen Reactions   Acetaminophen Nausea And Vomiting   Prior to Admission medications   Medication Sig Start Date End Date Taking? Authorizing Provider  ALPRAZolam Duanne Moron) 1 MG tablet TAKE 1 TABLET TWICE ADAY AS NEEDED FOR ANXIETY 11/22/17   Sater, Nanine Means, MD  estradiol (ESTRACE) 1 MG tablet Take 1 mg by mouth daily.  04/21/13   [provider]  FLUoxetine (PROZAC) 20 MG capsule Take 20 mg by mouth daily.  04/04/17   [provider]  gabapentin (NEURONTIN) 100 MG capsule TAKE 1 CAPSULE BY MOUTH THREE TIMES A DAY 12/22/17   Dohmeier, Asencion Partridge, MD  levothyroxine (SYNTHROID, LEVOTHROID) 75 MCG tablet Take 75 mcg by mouth daily before breakfast.  04/09/17   [provider]  metoCLOPramide (REGLAN) 10 MG tablet Take 1 tablet (10 mg total) by mouth every 6 (six) hours as needed for nausea (nausea/headache). 02/20/18   Julianne Rice, MD  naproxen sodium (ANAPROX) 550 MG tablet Take 550 mg by mouth as needed.  04/16/17   [provider]  ondansetron (ZOFRAN) 4 MG tablet Take 1 tablet (4 mg total) by mouth every 8 (eight) hours as needed for nausea or vomiting. 11/12/15   Shary Decamp, PA-C  OXcarbazepine (TRILEPTAL) 150 MG tablet Take 300 mg by mouth daily.  12/20/17   [provider]  pantoprazole (PROTONIX) 40 MG tablet Take 40 mg by mouth as needed. 02/18/12   [provider]  pilocarpine (SALAGEN) 5 MG tablet TAKE 1/2-1 TABLET BY MOUTH  DAILY AS NEEDED FOR DRY MOUTH 02/17/15   [provider]  spironolactone (ALDACTONE) 100 MG tablet Take 100 mg by mouth daily before breakfast.     [provider]  SUMAtriptan (IMITREX) 100 MG tablet MAY REPEAT IN 2 HOURS IF HEADACHE PERSISTS OR RECURS 04/30/18   Dohmeier, Asencion Partridge, MD  topiramate (TOPAMAX) 25 MG tablet TAKE 2 TABLETS (50 MG TOTAL) BY MOUTH 2 (TWO) TIMES DAILY. 12/15/17   Dohmeier, Asencion Partridge,  MD  traMADol (ULTRAM) 50 MG tablet Take 1 tablet (50 mg total) by mouth every 6 (six) hours as needed. 02/20/18   Julianne Rice, MD  traZODone (DESYREL) 50 MG tablet Take 50 mg by mouth as needed. 01/22/18   [provider]  valACYclovir (VALTREX) 500 MG tablet Take 500 mg by mouth daily.    [provider]  eletriptan (RELPAX) 40 MG tablet One tablet by mouth as needed for migraine headache.  If the headache improves and then returns, dose may be repeated after 2 hours have elapsed since first dose (do not exceed 80 mg per day). may repeat in 2 hours if necessary  10/20/11  [provider]  ferrous sulfate 325 (65 FE) MG tablet Take 325 mg by mouth daily with breakfast.  10/20/11  [provider]   Dg Chest 1 View  Result Date: 11/13/2018 CLINICAL DATA:  Pt c/o proximal and central right lower leg pain after falling down steps in her home today. Hx of previous right knee surgery to repair ligaments. No chest complaints. No hx of heart or lung problems. Pt is a nonsmoker. Patient and tech wore masks. EXAM: CHEST  1 VIEW COMPARISON:  03/30/2017 FINDINGS: Numerous leads and wires project over the chest. Patient rotated left. Midline trachea. Mild cardiomegaly. Remote posterolateral left eighth rib fracture. No pleural effusion or pneumothorax. Clear lungs. IMPRESSION: No acute cardiopulmonary disease. Electronically Signed   By: Abigail Miyamoto M.D.   On: 11/13/2018 21:13   Dg Knee 2 Views Right  Result Date: 11/13/2018 CLINICAL DATA:  Initial encounter for trauma and pain. EXAM: RIGHT KNEE - 1-2 VIEW COMPARISON:  None. FINDINGS: AP and lateral views. Comminuted proximal tibia and fibular fractures. The proximal tibial fracture may extend to the joint surface, but is suboptimally evaluated. Oblique component extends approximately 8 cm distal to the tibial plateau. No significant knee joint effusion. There is pretibial soft tissue swelling. Ligament attachment site about the  anterior tibia may indicate prior ACL repair. IMPRESSION: Comminuted proximal tibia and fibular fractures. Consider CT to evaluate for intra-articular extension at the tibial plateau. Electronically Signed   By: Abigail Miyamoto M.D.   On: 11/13/2018 21:16   Dg Tibia/fibula Right  Result Date: 11/13/2018 CLINICAL DATA:  Recent fall with knee pain, initial encounter EXAM: RIGHT TIBIA AND FIBULA - 2 VIEW COMPARISON:  None. FINDINGS: Comminuted proximal fibular fracture is noted as well as comminuted proximal tibial fracture. Mild impaction at the fracture site within the proximal tibia is noted. Postsurgical changes are noted in the proximal tibia as well. Midshaft comminuted fibular fracture is seen. No distal tibial or fibular fracture is noted IMPRESSION: Comminuted proximal and midshaft fibular fractures with comminuted proximal tibial fracture with impaction at the fracture site. Electronically Signed   By: Inez Catalina M.D.   On: 11/13/2018 21:09   Ct Cervical Spine Wo Contrast  Result Date: 11/13/2018 CLINICAL DATA:  Fall.  Pain. EXAM: CT CERVICAL SPINE WITHOUT CONTRAST TECHNIQUE: Multidetector CT imaging of the cervical spine was performed without intravenous contrast. Multiplanar CT image reconstructions were also generated. COMPARISON:  02/20/2018 FINDINGS: Alignment: Spinal visualization through the bottom of T2. Maintenance of vertebral body height and alignment across these levels. Skull base and vertebrae: Left sided craniotomy with similar hyperattenuating left cerebellar rounded structure which could be postoperative. No acute fracture within the cervical spine identified. There is facet arthropathy bilaterally. C1-2 articulation within normal limits on coronal reformats Soft tissues and spinal canal: Prevertebral soft tissues are unremarkable. Disc levels: Loss of intervertebral disc height at multiple levels, most significant at C6-7. Upper chest: No apical pneumothorax. Other: None. IMPRESSION:  Cervical spondylosis, without acute osseous abnormality. Electronically Signed   By: Abigail Miyamoto M.D.   On: 11/13/2018 20:59   Ct Knee Right Wo Contrast  Result Date: 11/13/2018 CLINICAL DATA:  Proximal tibial and fibular fractures, evaluate  for possible articular involvement EXAM: CT OF THE RIGHT KNEE WITHOUT CONTRAST TECHNIQUE: Multidetector CT imaging of the right knee was performed according to the standard protocol. Multiplanar CT image reconstructions were also generated. COMPARISON:  None. FINDINGS: Bones/Joint/Cartilage Comminuted fracture of the proximal fibula is noted with mild impaction at the fracture site. Additionally significantly comminuted and impacted fracture of the proximal tibial metaphysis is noted. The fracture fragments extend into the proximal diaphysis as well. There is impaction of the posterolateral aspect of the tibia superiorly into the region of the tibial plateau although no definitive articular involvement is seen. Changes of prior ligament repair are noted with fixation staple in the anterior aspect of the tibia. Subchondral sclerosis is noted in the lateral tibial plateau as well as some mild degenerative changes of the knee joint. Distal femur is within normal limits. Midshaft fibular fracture is not evaluated on this exam. Ligaments Suboptimally assessed by CT.  Cruciate ligaments appear intact. Muscles and Tendons No specific muscular abnormality is noted. No significant muscular hematoma is seen. Soft tissues Changes are noted in the subcutaneous tissues consistent with edema. No significant joint effusion is noted. IMPRESSION: Comminuted proximal tibial and fibular fractures with impaction worse in the proximal tibia with extension of posterior fragments to just below the articular surface within posterior aspect of the tibial plateaus. No definitive intra-articular involvement is seen. Electronically Signed   By: Inez Catalina M.D.   On: 11/13/2018 22:27   Family  History Reviewed and non-contributory, no pertinent history of problems with bleeding or anesthesia      Review of Systems 14 system ROS conducted and negative except for that noted in HPI   OBJECTIVE  Vitals: Patient Vitals for the past 8 hrs:  BP Temp Temp src Pulse Resp SpO2  11/14/18 0510 (!) 142/125 97.7 F (36.5 C) Oral 75 16 99 %   General: Alert, no acute distress Cardiovascular: Warm extremities noted Respiratory: No cyanosis, no use of accessory musculature GI: No organomegaly, abdomen is soft and non-tender Skin: No lesions in the area of chief complaint other than those listed below in MSK exam.  Neurologic: Sensation intact distally save for the below mentioned MSK exam Psychiatric: Patient is competent for consent with normal mood and affect Lymphatic: No swelling obvious and reported other than the area involved in the exam below Extremities  Right lower extremity: Patient with significant swelling but compressible compartments distally, distal motor and sensory is intact with EHL/TA/GSC function voluntarily.  No pain with passive stretch.  Normal previous foot.  No range of motion tested at the knee secondary to known fracture.  Well-healed curvilinear lateral incision noted.    Test Results Imaging X-ray and CT of the knee reviewed.  Patient has a staple in place from anterolateral to posterior from previous ligamentous repair likely.  She has a comminuted intra-articular fracture though it is relatively simple intra-articular split with extensively comminuted proximal tibial component.  Labs cbc Recent Labs    11/13/18 2019  WBC 14.8*  HGB 12.4  HCT 38.1  PLT 294    Labs inflam No results for input(s): CRP in the last 72 hours.  Invalid input(s): ESR  Labs coag No results for input(s): INR, PTT in the last 72 hours.  Invalid input(s): PT  Recent Labs    11/13/18 2019  NA 137  K 4.0  CL 106  CO2 22  GLUCOSE 116*  BUN 12  CREATININE 0.85    CALCIUM 9.2  ASSESSMENT AND PLAN: 68 y.o. female with the following: Extensively comminuted right tibial fracture  Patient has a complex injury that will require surgical intervention to improve her alignment and reduce risk of complications including compartment syndrome and postop arthrosis.  Talk to the patient about her options at length.  Based on her level of swelling we feel that external fixation would be appropriate today with use temporizing measures until she is appropriate for definitive fixation.  We talked about risk benefits alternatives of surgery including infection, pin site issues, stiffness and the need for further procedures.  She understands postop arthrosis is likely.  All questions were answered.  Plan for spanning the ex-fix today.  Depending on swelling in the operating room may plan on definitive fixation Friday versus early next week.

## 2018-11-14 NOTE — Anesthesia Procedure Notes (Addendum)
Procedure Name: Intubation Date/Time: 11/14/2018 12:07 PM Performed by: Leonor Liv, CRNA Pre-anesthesia Checklist: Patient identified, Emergency Drugs available, Suction available and Patient being monitored Patient Re-evaluated:Patient Re-evaluated prior to induction Oxygen Delivery Method: Circle System Utilized Preoxygenation: Pre-oxygenation with 100% oxygen Induction Type: IV induction and Rapid sequence Laryngoscope Size: Mac and 3 Grade View: Grade I Tube type: Oral Tube size: 7.0 mm Number of attempts: 1 Airway Equipment and Method: Stylet and Oral airway Placement Confirmation: ETT inserted through vocal cords under direct vision,  positive ETCO2 and breath sounds checked- equal and bilateral Secured at: 19 cm Tube secured with: Tape Dental Injury: Teeth and Oropharynx as per pre-operative assessment

## 2018-11-14 NOTE — TOC Initial Note (Signed)
Transition of Care [facility]) - Initial/Assessment Note    Patient Details  Name: Caitlin Irwin MRN: [id number] Date of Birth: 30-Oct-1950  Transition of Care [facility]) CM/SW Contact:    Marilu Favre, RN Phone Number: 11/14/2018, 10:23 AM  Clinical Narrative:                  Patient for surgery today. Patient currently receiving pain medication. Will continue to follow for discharge needs. Expected Discharge Plan: [place] Barriers to Discharge: Continued Medical Work up   Patient Goals and CMS Choice        Expected Discharge Plan and Services Expected Discharge Plan: [place]                                  Prior Living Arrangements/Services                       Activities of Daily Living Home Assistive Devices/Equipment: None ADL Screening (condition at time of admission) Patient's cognitive ability adequate to safely complete daily activities?: Yes Is the patient deaf or have difficulty hearing?: No Does the patient have difficulty seeing, even when wearing glasses/contacts?: No Does the patient have difficulty concentrating, remembering, or making decisions?: No Patient able to express need for assistance with ADLs?: Yes Does the patient have difficulty dressing or bathing?: No Independently performs ADLs?: Yes (appropriate for developmental age) Does the patient have difficulty walking or climbing stairs?: No Weakness of Legs: Both Weakness of Arms/Hands: None  Permission Sought/Granted                  Emotional Assessment              Admission diagnosis:  Pre-op chest exam [W26.378] Fall, initial encounter [W19.XXXA] Other closed fracture of shaft of right fibula, initial encounter [S82.491A] Other closed fracture of proximal end of right tibia, initial encounter [S82.191A] Patient Active Problem List   Diagnosis Date Noted  . Closed fracture of proximal end of right tibia and fibula  11/13/2018  . Epigastric hernia 09/15/2015  . Migraine, intractable 05/28/2015  . Migraine 05/28/2015  . Headache, migraine   . Insomnia   . Emesis   . History of hypothyroidism   . Microcytic hypochromic anemia   . Salicylate poisoning 58/85/0277  . Migraine headache 08/28/2014  . Hypokalemia 08/28/2014  . Salicylate intoxication 08/28/2014  . Aspirin toxicity   . Ataxia 03/24/2014  . Complicated grief 41/28/7867  . Altered mental status, unspecified altered mental status type 10/13/2013  . Polypharmacy 10/13/2013  . Depression 10/13/2013  . Atypical face pain 02/10/2013  . Esophageal reflux 06/12/2012  . Trigeminal neuralgia 11/07/2011  . Hx of migraine headaches 10/20/2011  . Dysphagia, unspecified(787.20) 09/26/2011  . Weight gain 09/26/2011  . NAUSEA 02/02/2010  . DIARRHEA 02/02/2010  . CHANGE IN BOWELS 02/02/2010  . ULCER-DUODENAL 11/26/2008  . History of peptic ulcer 11/26/2008  . GASTRITIS 11/26/2008  . HYPOTHYROIDISM 10/03/2007  . CONSTIPATION, CHRONIC 10/03/2007  . IRRITABLE BOWEL SYNDROME 10/03/2007  . COLITIS, HX OF 10/03/2007   PCP:  Levin Erp, MD Pharmacy:   [id number] - [place], [place] [place] [address] Phone: [phone] Fax: [phone]     Social Determinants of Health (SDOH) Interventions    Readmission Risk Interventions No flowsheet data found.

## 2018-11-14 NOTE — Op Note (Signed)
Orthopaedic Surgery Operative Note (CSN: [id number])  Caitlin Irwin  30-Nov-1950 Date of Surgery: 11/14/2018   Diagnoses:  Bicondylar tibial plateau fracture with significant swelling  Procedure: Right knee spanning external fixation   Operative Finding Successful completion of planned procedure.  Poor bone quality noted in a very unstable fracture, patient's compartments were soft and compressible.  She was rather swollen and not appropriate for immediate definitive fixation.  Post-operative plan: The patient will be readmitted to medicine.  Patient will be nonweightbearing on this extremity.  DVT prophylaxis .  Pain control with PRN pain medication preferring oral medicines.  Plan for definitive fixation on 11/16/2018 pending swelling check.  Post-Op Diagnosis: Same Surgeons:Primary: Hiram Gash, MD Assistants: Joya Gaskins, OPAC Location: [facility] OR ROOM 15 Anesthesia: General Antibiotics: Ancef 2g preop Tourniquet time: * No tourniquets in log * Estimated Blood Loss: Minimal Complications: None Specimens: None Implants: * No implants in log *  Indications for Surgery:   Caitlin Irwin is a 68 y.o. female with fall resulting in a comminuted bicondylar tibial plateau fracture with significant swelling.  Patient had a remote history of a poorly described surgery which likely was a ligament repair laterally in her knee and she had an anterior lateral to posterior medial staple placed in her proximal tibia..  Benefits and risks of operative and nonoperative management were discussed prior to surgery with patient/guardian(s) and informed consent form was completed.  Specific risks including infection, need for additional surgery, need for definitive fixation, compartment syndrome, pin site complications and stiffness.   Procedure:   The patient was identified in the preoperative holding area where the surgical site was marked. The patient was taken to the OR where a procedural timeout  was called and the above noted anesthesia was induced.  The patient was positioned prone on a radiolucent table.  Preoperative antibiotics were dosed.  The patient's right leg was prepped and draped in the usual sterile fashion.  A second preoperative timeout was called.      We began with fluoroscopic guidance of the fracture itself.  We then used the Biomet external fixation system to place 2 half pins in the tibia and the femur making small incisions and dissecting down bluntly with a tonsil prior to placing the pins.  Pins were placed bicortically checking position on fluoroscopy.  We then placed our bars as is typical and using fluoroscopic guidance on orthogonal views obtained an appropriate reduction with good length across the fracture site.  Final fluoroscopic images demonstrated appropriate reduction.  Pin sites were dressed with Xeroform and sterile gauze and an Ace wrap was loosely placed.  Patient was awoken taken to PACU in stable condition.  Joya Gaskins, OPA-C, present and scrubbed throughout the case, critical for completion in a timely fashion, and for retraction, instrumentation, closure.

## 2018-11-14 NOTE — Social Work (Signed)
CSW acknowledging consult for SNF placement. Will follow for therapy recommendations.   Shruti Arrey, MSW, LCSWA [place] Clinical Social Work [phone]   

## 2018-11-14 NOTE — Progress Notes (Signed)
PROGRESS NOTE  Caitlin Irwin HYW:[id number] DOB: [birth year] DOA: 11/13/2018 PCP: Levin Erp, MD  HPI/Recap of past 41 hours: 68 year old female with past medical history of anxiety and depression plus chronic migraines who sustained a mechanical fall on the evening of 4/14 and presented to the emergency room.  She was found to have a right tib-fib fracture and so she was admitted to the hospitalist service.  Seen by orthopedic surgery with plans to take her to the operating room later today.  Patient seen prior to surgery.  Feels extremely nauseated with some dry heaving.  Nausea right now is overriding any pain concerns  Assessment/Plan: Principal Problem:   Closed fracture of proximal end of right tibia and fibula: Patient taken to the OR today for external fixation spanning the right knee.  Patient will then have definitive fixation on Friday 4/17.  Continue nonweightbearing Active Problems:   Hx of migraine headaches   Depression: Continue home medications   History of hypothyroidism: Continue Synthroid, once p.o.   Code Status: Full code  Family Communication: Left message for family  Disposition Plan: Likely will need short-term skilled nursing after   Consultants:  Orthopedics  Procedures:  External fixation of right knee done 4/15  Plan definitive fixation 4/17  Antimicrobials:  Preop Ancef  DVT prophylaxis: Lovenox   Objective: Vitals:   11/14/18 1315 11/14/18 1330  BP: (!) 144/67 (!) 168/76  Pulse: 94 91  Resp: 18 17  Temp:    SpO2: 92% 97%    Intake/Output Summary (Last 24 hours) at 11/14/2018 1400 Last data filed at 11/14/2018 1300 Gross per 24 hour  Intake 750 ml  Output 10 ml  Net 740 ml   Filed Weights   11/13/18 1938 11/14/18 0006  Weight: 49.9 kg 49.2 kg   Body mass index is 21.91 kg/m.  Exam:   General: Alert and oriented, moderate distress secondary to nausea  HEENT: Normocephalic and atraumatic, mucous membranes slightly  dry  Neck: Supple, no JVD  Cardiovascular: Regular rate and rhythm, S1-S2  Respiratory: Clear to auscultation bilaterally  Abdomen: Soft, nontender, nondistended, positive bowel sounds  Musculoskeletal: No clubbing or cyanosis, trace pitting edema, right leg in immobilizer  Skin: No skin breaks, tears or lesions  Psychiatry: Appropriate, mild distress secondary to nausea the   Data Reviewed: CBC: Recent Labs  Lab 11/13/18 2019  WBC 14.8*  NEUTROABS 12.2*  HGB 12.4  HCT 38.1  MCV 90.5  PLT 485   Basic Metabolic Panel: Recent Labs  Lab 11/13/18 2019  NA 137  K 4.0  CL 106  CO2 22  GLUCOSE 116*  BUN 12  CREATININE 0.85  CALCIUM 9.2   GFR: Estimated Creatinine Clearance: 42.6 mL/min (by C-G formula based on SCr of 0.85 mg/dL). Liver Function Tests: No results for input(s): AST, ALT, ALKPHOS, BILITOT, PROT, ALBUMIN in the last 168 hours. No results for input(s): LIPASE, AMYLASE in the last 168 hours. No results for input(s): AMMONIA in the last 168 hours. Coagulation Profile: No results for input(s): INR, PROTIME in the last 168 hours. Cardiac Enzymes: No results for input(s): CKTOTAL, CKMB, CKMBINDEX, TROPONINI in the last 168 hours. BNP (last 3 results) No results for input(s): PROBNP in the last 8760 hours. HbA1C: No results for input(s): HGBA1C in the last 72 hours. CBG: No results for input(s): GLUCAP in the last 168 hours. Lipid Profile: No results for input(s): CHOL, HDL, LDLCALC, TRIG, CHOLHDL, LDLDIRECT in the last 72 hours. Thyroid Function Tests: No results  for input(s): TSH, T4TOTAL, FREET4, T3FREE, THYROIDAB in the last 72 hours. Anemia Panel: No results for input(s): VITAMINB12, FOLATE, FERRITIN, TIBC, IRON, RETICCTPCT in the last 72 hours. Urine analysis:    Component Value Date/Time   COLORURINE YELLOW 11/19/2016 2005   APPEARANCEUR CLEAR 11/19/2016 2005   LABSPEC 1.013 11/19/2016 2005   PHURINE 7.0 11/19/2016 2005   GLUCOSEU NEGATIVE  11/19/2016 2005   HGBUR NEGATIVE 11/19/2016 2005   BILIRUBINUR NEGATIVE 11/19/2016 2005   KETONESUR 20 (A) 11/19/2016 2005   PROTEINUR NEGATIVE 11/19/2016 2005   UROBILINOGEN 0.2 08/28/2014 1930   NITRITE NEGATIVE 11/19/2016 2005   LEUKOCYTESUR NEGATIVE 11/19/2016 2005   Sepsis Labs: @LABRCNTIP (procalcitonin:4,lacticidven:4)  ) Recent Results (from the past 240 hour(s))  Surgical pcr screen     Status: None   Collection Time: 11/14/18 12:25 AM  Result Value Ref Range Status   MRSA, PCR NEGATIVE NEGATIVE Final   Staphylococcus aureus NEGATIVE NEGATIVE Final    Comment: (NOTE) The Xpert SA Assay (FDA approved for NASAL specimens in patients 64 years of age and older), is one component of a comprehensive surveillance program. It is not intended to diagnose infection nor to guide or monitor treatment. Performed at [facility], [place] [address], [place], [place] [address]       Studies: Dg Chest 1 View  Result Date: 11/13/2018 CLINICAL DATA:  Pt c/o proximal and central right lower leg pain after falling down steps in her home today. Hx of previous right knee surgery to repair ligaments. No chest complaints. No hx of heart or lung problems. Pt is a nonsmoker. Patient and tech wore masks. EXAM: CHEST  1 VIEW COMPARISON:  03/30/2017 FINDINGS: Numerous leads and wires project over the chest. Patient rotated left. Midline trachea. Mild cardiomegaly. Remote posterolateral left eighth rib fracture. No pleural effusion or pneumothorax. Clear lungs. IMPRESSION: No acute cardiopulmonary disease. Electronically Signed   By: Abigail Miyamoto M.D.   On: 11/13/2018 21:13   Dg Knee 2 Views Right  Result Date: 11/13/2018 CLINICAL DATA:  Initial encounter for trauma and pain. EXAM: RIGHT KNEE - 1-2 VIEW COMPARISON:  None. FINDINGS: AP and lateral views. Comminuted proximal tibia and fibular fractures. The proximal tibial fracture may extend to the joint surface, but is suboptimally evaluated.  Oblique component extends approximately 8 cm distal to the tibial plateau. No significant knee joint effusion. There is pretibial soft tissue swelling. Ligament attachment site about the anterior tibia may indicate prior ACL repair. IMPRESSION: Comminuted proximal tibia and fibular fractures. Consider CT to evaluate for intra-articular extension at the tibial plateau. Electronically Signed   By: Abigail Miyamoto M.D.   On: 11/13/2018 21:16   Dg Tibia/fibula Right  Result Date: 11/13/2018 CLINICAL DATA:  Recent fall with knee pain, initial encounter EXAM: RIGHT TIBIA AND FIBULA - 2 VIEW COMPARISON:  None. FINDINGS: Comminuted proximal fibular fracture is noted as well as comminuted proximal tibial fracture. Mild impaction at the fracture site within the proximal tibia is noted. Postsurgical changes are noted in the proximal tibia as well. Midshaft comminuted fibular fracture is seen. No distal tibial or fibular fracture is noted IMPRESSION: Comminuted proximal and midshaft fibular fractures with comminuted proximal tibial fracture with impaction at the fracture site. Electronically Signed   By: Inez Catalina M.D.   On: 11/13/2018 21:09   Dg Ankle Complete Right  Result Date: 11/14/2018 CLINICAL DATA:  Right ankle pain. Proximal tibia and fibula fractures. EXAM: RIGHT ANKLE - COMPLETE 3+ VIEW COMPARISON:  Radiographs dated 11/13/2018 FINDINGS: There is no evidence of ankle fracture, dislocation, or joint effusion. There is an incompletely visualized fracture of the midshaft right fibula. There is no evidence of arthropathy or other focal bone abnormality. Soft tissues are unremarkable. IMPRESSION: Normal right ankle. Right mid fibula fracture as described on prior exams. Electronically Signed   By: Lorriane Shire M.D.   On: 11/14/2018 10:16   Ct Cervical Spine Wo Contrast  Result Date: 11/13/2018 CLINICAL DATA:  Fall.  Pain. EXAM: CT CERVICAL SPINE WITHOUT CONTRAST TECHNIQUE: Multidetector CT imaging of the  cervical spine was performed without intravenous contrast. Multiplanar CT image reconstructions were also generated. COMPARISON:  02/20/2018 FINDINGS: Alignment: Spinal visualization through the bottom of T2. Maintenance of vertebral body height and alignment across these levels. Skull base and vertebrae: Left sided craniotomy with similar hyperattenuating left cerebellar rounded structure which could be postoperative. No acute fracture within the cervical spine identified. There is facet arthropathy bilaterally. C1-2 articulation within normal limits on coronal reformats Soft tissues and spinal canal: Prevertebral soft tissues are unremarkable. Disc levels: Loss of intervertebral disc height at multiple levels, most significant at C6-7. Upper chest: No apical pneumothorax. Other: None. IMPRESSION: Cervical spondylosis, without acute osseous abnormality. Electronically Signed   By: Abigail Miyamoto M.D.   On: 11/13/2018 20:59   Ct Knee Right Wo Contrast  Result Date: 11/13/2018 CLINICAL DATA:  Proximal tibial and fibular fractures, evaluate for possible articular involvement EXAM: CT OF THE RIGHT KNEE WITHOUT CONTRAST TECHNIQUE: Multidetector CT imaging of the right knee was performed according to the standard protocol. Multiplanar CT image reconstructions were also generated. COMPARISON:  None. FINDINGS: Bones/Joint/Cartilage Comminuted fracture of the proximal fibula is noted with mild impaction at the fracture site. Additionally significantly comminuted and impacted fracture of the proximal tibial metaphysis is noted. The fracture fragments extend into the proximal diaphysis as well. There is impaction of the posterolateral aspect of the tibia superiorly into the region of the tibial plateau although no definitive articular involvement is seen. Changes of prior ligament repair are noted with fixation staple in the anterior aspect of the tibia. Subchondral sclerosis is noted in the lateral tibial plateau as well  as some mild degenerative changes of the knee joint. Distal femur is within normal limits. Midshaft fibular fracture is not evaluated on this exam. Ligaments Suboptimally assessed by CT.  Cruciate ligaments appear intact. Muscles and Tendons No specific muscular abnormality is noted. No significant muscular hematoma is seen. Soft tissues Changes are noted in the subcutaneous tissues consistent with edema. No significant joint effusion is noted. IMPRESSION: Comminuted proximal tibial and fibular fractures with impaction worse in the proximal tibia with extension of posterior fragments to just below the articular surface within posterior aspect of the tibial plateaus. No definitive intra-articular involvement is seen. Electronically Signed   By: Inez Catalina M.D.   On: 11/13/2018 22:27    Scheduled Meds:  chlorhexidine  60 mL Topical Once   [START ON 11/15/2018] enoxaparin  40 mg Subcutaneous Once   fentaNYL       promethazine       scopolamine  1 patch Transdermal Q72H   scopolamine        Continuous Infusions:  lactated ringers 10 mL/hr at 11/14/18 1145     LOS: 1 day     Annita Brod, MD [facility]  To reach me or the doctor on call, go to: [email or web address] Password [email or web address]  11/14/2018, 2:00 PM

## 2018-11-14 NOTE — Anesthesia Postprocedure Evaluation (Signed)
Anesthesia Post Note  Patient: Caitlin Irwin  Procedure(s) Performed: EXTERNAL FIXATION LEG (Right Leg Lower)     Patient location during evaluation: PACU Anesthesia Type: General Level of consciousness: awake and alert Pain management: pain level controlled Vital Signs Assessment: post-procedure vital signs reviewed and stable Respiratory status: spontaneous breathing, nonlabored ventilation, respiratory function stable and patient connected to nasal cannula oxygen Cardiovascular status: blood pressure returned to baseline and stable Postop Assessment: no apparent nausea or vomiting Anesthetic complications: no    Last Vitals:  Vitals:   11/14/18 1330 11/14/18 1405  BP: (!) 168/76 (!) 163/72  Pulse: 91 89  Resp: 17 18  Temp:    SpO2: 97% 100%    Last Pain:  Vitals:   11/14/18 1021  TempSrc:   PainSc: 10-Worst pain ever                 Tiajuana Amass

## 2018-11-14 NOTE — Progress Notes (Signed)
Orthopedic Tech Progress Note Patient Details:  Caitlin Irwin 11-21-1950 [id number] Splint was supposed to be done on night shift which wasn't. Came this morning asked RN should I apply the splint still and he said "no need the patient is having surgery this morning" Patient ID: Caitlin Irwin, female   DOB: 04/29/1951, 68 y.o.   MRN: [id number]   Caitlin Irwin 11/14/2018, 11:47 AM

## 2018-11-14 NOTE — Progress Notes (Signed)
Drainage noted around bottom pin site on fixator. MD aware.

## 2018-11-14 NOTE — Anesthesia Preprocedure Evaluation (Signed)
Anesthesia Evaluation  Patient identified by MRN, date of birth, ID band Patient awake    Reviewed: Allergy & Precautions, NPO status , Patient's Chart, lab work & pertinent test results  Airway Mallampati: II  TM Distance: >3 FB     Dental   Pulmonary neg pulmonary ROS,    breath sounds clear to auscultation       Cardiovascular negative cardio ROS   Rhythm:Regular Rate:Normal     Neuro/Psych  Headaches,    GI/Hepatic Neg liver ROS, hiatal hernia, PUD, GERD  ,  Endo/Other  Hypothyroidism   Renal/GU negative Renal ROS     Musculoskeletal  (+) Arthritis ,   Abdominal   Peds  Hematology negative hematology ROS (+)   Anesthesia Other Findings   Reproductive/Obstetrics                             Lab Results  Component Value Date   WBC 14.8 (H) 11/13/2018   HGB 12.4 11/13/2018   HCT 38.1 11/13/2018   MCV 90.5 11/13/2018   PLT 294 11/13/2018   Lab Results  Component Value Date   CREATININE 0.85 11/13/2018   BUN 12 11/13/2018   NA 137 11/13/2018   K 4.0 11/13/2018   CL 106 11/13/2018   CO2 22 11/13/2018    Anesthesia Physical Anesthesia Plan  ASA: II  Anesthesia Plan: General   Post-op Pain Management:    Induction: Intravenous  PONV Risk Score and Plan: 3 and Treatment may vary due to age or medical condition, Ondansetron, Dexamethasone and Midazolam  Airway Management Planned: Oral ETT  Additional Equipment:   Intra-op Plan:   Post-operative Plan: Extubation in OR  Informed Consent: I have reviewed the patients History and Physical, chart, labs and discussed the procedure including the risks, benefits and alternatives for the proposed anesthesia with the patient or authorized representative who has indicated his/her understanding and acceptance.     Dental advisory given  Plan Discussed with:   Anesthesia Plan Comments:         Anesthesia Quick  Evaluation

## 2018-11-14 NOTE — Transfer of Care (Signed)
Immediate Anesthesia Transfer of Care Note  Patient: Caitlin Irwin  Procedure(s) Performed: EXTERNAL FIXATION LEG (Right Leg Lower)  Patient Location: PACU  Anesthesia Type:General  Level of Consciousness: drowsy, patient cooperative and confused  Airway & Oxygen Therapy: Patient Spontanous Breathing and Patient connected to face mask oxygen  Post-op Assessment: Report given to RN, Post -op Vital signs reviewed and stable and Patient moving all extremities  Post vital signs: Reviewed and stable  Last Vitals:  Vitals Value Taken Time  BP 150/119 11/14/2018 12:56 PM  Temp    Pulse 94 11/14/2018  1:01 PM  Resp 19 11/14/2018  1:01 PM  SpO2 88 % 11/14/2018  1:01 PM  Vitals shown include unvalidated device data.  Last Pain:  Vitals:   11/14/18 1021  TempSrc:   PainSc: 10-Worst pain ever         Complications: No apparent anesthesia complications

## 2018-11-15 ENCOUNTER — Encounter (HOSPITAL_COMMUNITY): Payer: Self-pay | Admitting: Orthopaedic Surgery

## 2018-11-15 LAB — CBC
HCT: 30.7 % — ABNORMAL LOW (ref 36.0–46.0)
Hemoglobin: 10.1 g/dL — ABNORMAL LOW (ref 12.0–15.0)
MCH: 29.5 pg (ref 26.0–34.0)
MCHC: 32.9 g/dL (ref 30.0–36.0)
MCV: 89.8 fL (ref 80.0–100.0)
Platelets: 298 10*3/uL (ref 150–400)
RBC: 3.42 MIL/uL — ABNORMAL LOW (ref 3.87–5.11)
RDW: 13.2 % (ref 11.5–15.5)
WBC: 7.6 10*3/uL (ref 4.0–10.5)
nRBC: 0 % (ref 0.0–0.2)

## 2018-11-15 LAB — BASIC METABOLIC PANEL
Anion gap: 10 (ref 5–15)
BUN: 10 mg/dL (ref 8–23)
CO2: 25 mmol/L (ref 22–32)
Calcium: 8.9 mg/dL (ref 8.9–10.3)
Chloride: 100 mmol/L (ref 98–111)
Creatinine, Ser: 0.71 mg/dL (ref 0.44–1.00)
GFR calc Af Amer: >60 mL/min (ref >60)
GFR calc non Af Amer: >60 mL/min (ref >60)
Glucose, Bld: 122 mg/dL — ABNORMAL HIGH (ref 70–99)
Potassium: 3.9 mmol/L (ref 3.5–5.1)
Sodium: 135 mmol/L (ref 135–145)

## 2018-11-15 MED ORDER — CEFAZOLIN SODIUM-DEXTROSE 2-4 GM/100ML-% IV SOLN
2.0000 g | INTRAVENOUS | Status: AC
  Administered 2018-11-16: 2 g via INTRAVENOUS
  Filled 2018-11-15: qty 100

## 2018-11-15 MED ORDER — TRAZODONE HCL 100 MG PO TABS
100.0000 mg | ORAL_TABLET | Freq: Every day | ORAL | Status: DC
  Administered 2018-11-15 – 2018-11-19 (×5): 100 mg via ORAL
  Filled 2018-11-15 (×5): qty 1

## 2018-11-15 MED ORDER — ALPRAZOLAM 0.5 MG PO TABS
0.5000 mg | ORAL_TABLET | Freq: Two times a day (BID) | ORAL | Status: DC | PRN
  Administered 2018-11-15 – 2018-11-16 (×2): 0.5 mg via ORAL
  Filled 2018-11-15 (×2): qty 1

## 2018-11-15 MED ORDER — SENNOSIDES-DOCUSATE SODIUM 8.6-50 MG PO TABS
2.0000 | ORAL_TABLET | Freq: Two times a day (BID) | ORAL | Status: DC
  Administered 2018-11-16 – 2018-11-20 (×7): 2 via ORAL
  Filled 2018-11-15 (×9): qty 2

## 2018-11-15 MED ORDER — METHOCARBAMOL 500 MG PO TABS
500.0000 mg | ORAL_TABLET | Freq: Once | ORAL | Status: AC
  Administered 2018-11-15: 500 mg via ORAL
  Filled 2018-11-15: qty 1

## 2018-11-15 MED ORDER — CHLORHEXIDINE GLUCONATE 4 % EX LIQD
60.0000 mL | Freq: Once | CUTANEOUS | Status: AC
  Administered 2018-11-16: 4 via TOPICAL
  Filled 2018-11-15: qty 60

## 2018-11-15 MED ORDER — LEVOTHYROXINE SODIUM 75 MCG PO TABS
75.0000 ug | ORAL_TABLET | Freq: Every day | ORAL | Status: DC
  Administered 2018-11-16 – 2018-11-20 (×5): 75 ug via ORAL
  Filled 2018-11-15 (×6): qty 1

## 2018-11-15 NOTE — Progress Notes (Signed)
Patient Demographics:    Caitlin Irwin, is a 68 y.o. female, DOB - 20-Jan-1951, QMV:[id number]  Admit date - 11/13/2018   Admitting Physician Etta Quill, DO  Outpatient Primary MD for the patient is Levin Erp, MD  LOS - 2   Chief Complaint  Patient presents with   Fall        Subjective:    Caitlin Irwin today has no fevers, no emesis,  No chest pain, right leg pain persist patient trying to keep it elevated...  Assessment  & Plan :    Principal Problem:   Closed fracture of proximal end of right tibia and fibula Active Problems:   Hx of migraine headaches   Depression   History of hypothyroidism  Brief summary 68 year old female with past medical history of anxiety and depression plus chronic migraines who admitted on 11/13/2018 after mechanical fall while walking her dog leading to right tib-fib fracture    A/p 1)Status post mechanical fall with closed fracture of proximal end of right tibia and fibula:--  S/p external fixation on 11/14/18, plan is for definitive fixation on Friday 11/16/18 ,  Continue nonweightbearing  2)Acute Blood Loss Anemia in a post-op pt--- Hgb down to 10.1 from 12.4... Suspect due to fracture on blood loss associated with initial fracture on postop blood loss.... Monitor closely and transfuse as clinically indicated  3)  Depression/Anxiety: Stable, okay to give trazodone nightly Xanax 0.5 mg po bid prn  4)  History of hypothyroidism: Continue Synthroid, once p.o.  5) Hx of migraine headaches--- Imitrex as needed  Disposition/Need for in-Hospital Stay- patient unable to be discharged at this time due to awaiting definitive fixation of right tib-fib fracture on 11/16/2018  Code Status : Full Code  Family Communication:   na   Disposition Plan  : TBD  Consultants:  Orthopedics  Procedures:  External fixation of right knee done 4/15  Plan  definitive fixation 4/17  Antimicrobials:  Preop Ancef  DVT prophylaxis: Lovenox    Lab Results  Component Value Date   PLT 298 11/15/2018    Inpatient Medications  Scheduled Meds:  acetaminophen  1,000 mg Oral Q8H   senna-docusate  2 tablet Oral BID   traZODone  100 mg Oral QHS   Continuous Infusions:  lactated ringers 10 mL/hr at 11/14/18 1145   PRN Meds:.ALPRAZolam, HYDROcodone-acetaminophen, HYDROmorphone (DILAUDID) injection, menthol-cetylpyridinium **OR** phenol, metoCLOPramide **OR** metoCLOPramide (REGLAN) injection, ondansetron (ZOFRAN) IV, oxyCODONE, SUMAtriptan    Anti-infectives (From admission, onward)   Start     Dose/Rate Route Frequency Ordered Stop   11/14/18 1830  ceFAZolin (ANCEF) IVPB 2g/100 mL premix     2 g 200 mL/hr over 30 Minutes Intravenous Every 6 hours 11/14/18 1618 11/15/18 0028   11/14/18 1130  ceFAZolin (ANCEF) IVPB 2g/100 mL premix     2 g 200 mL/hr over 30 Minutes Intravenous On call to O.R. 11/14/18 1120 11/14/18 1201        Objective:   Vitals:   11/14/18 1405 11/14/18 2051 11/15/18 0132 11/15/18 0432  BP: (!) 163/72 (!) 115/47 (!) 119/52 (!) 126/55  Pulse: 89 79 77 70  Resp: 18 18 18 18   Temp:  98.5 F (36.9 C) 98.2 F (36.8 C) 98.2 F (36.8 C)  TempSrc:  Oral Oral Oral  SpO2: 100% 97% 98% 97%  Weight:      Height:        Wt Readings from Last 3 Encounters:  11/14/18 49.2 kg  04/20/17 50.8 kg  03/30/17 45.4 kg     Intake/Output Summary (Last 24 hours) at 11/15/2018 0945 Last data filed at 11/15/2018 0400 Gross per 24 hour  Intake 946 ml  Output 110 ml  Net 836 ml     Physical Exam Patient is examined daily including today on 11/15/18 , exams remain the same as of yesterday except that has changed   Gen:- Awake Alert,   HEENT:- [place].AT, No sclera icterus Neck-Supple Neck,No JVD,.  Lungs-  CTAB , fair symmetrical air movement CV- S1, S2 normal, regular  Abd-  +ve B.Sounds, Abd Soft, No tenderness,     Extremity/Skin:-   pedal pulses present .,  Right leg external fixator/hardware noted Psych-affect is appropriate, oriented x3 Neuro-no new focal deficits, no tremors   Data Review:   Micro Results Recent Results (from the past 240 hour(s))  Surgical pcr screen     Status: None   Collection Time: 11/14/18 12:25 AM  Result Value Ref Range Status   MRSA, PCR NEGATIVE NEGATIVE Final   Staphylococcus aureus NEGATIVE NEGATIVE Final    Comment: (NOTE) The Xpert SA Assay (FDA approved for NASAL specimens in patients 53 years of age and older), is one component of a comprehensive surveillance program. It is not intended to diagnose infection nor to guide or monitor treatment. Performed at [facility], [place] [address] [place], [place] [address]     Radiology Reports Dg Chest 1 View  Result Date: 11/13/2018 CLINICAL DATA:  Pt c/o proximal and central right lower leg pain after falling down steps in her home today. Hx of previous right knee surgery to repair ligaments. No chest complaints. No hx of heart or lung problems. Pt is a nonsmoker. Patient and tech wore masks. EXAM: CHEST  1 VIEW COMPARISON:  03/30/2017 FINDINGS: Numerous leads and wires project over the chest. Patient rotated left. Midline trachea. Mild cardiomegaly. Remote posterolateral left eighth rib fracture. No pleural effusion or pneumothorax. Clear lungs. IMPRESSION: No acute cardiopulmonary disease. Electronically Signed   By: Abigail Miyamoto M.D.   On: 11/13/2018 21:13   Dg Knee 2 Views Right  Result Date: 11/13/2018 CLINICAL DATA:  Initial encounter for trauma and pain. EXAM: RIGHT KNEE - 1-2 VIEW COMPARISON:  None. FINDINGS: AP and lateral views. Comminuted proximal tibia and fibular fractures. The proximal tibial fracture may extend to the joint surface, but is suboptimally evaluated. Oblique component extends approximately 8 cm distal to the tibial plateau. No significant knee joint effusion. There is pretibial soft  tissue swelling. Ligament attachment site about the anterior tibia may indicate prior ACL repair. IMPRESSION: Comminuted proximal tibia and fibular fractures. Consider CT to evaluate for intra-articular extension at the tibial plateau. Electronically Signed   By: Abigail Miyamoto M.D.   On: 11/13/2018 21:16   Dg Tibia/fibula Right  Result Date: 11/13/2018 CLINICAL DATA:  Recent fall with knee pain, initial encounter EXAM: RIGHT TIBIA AND FIBULA - 2 VIEW COMPARISON:  None. FINDINGS: Comminuted proximal fibular fracture is noted as well as comminuted proximal tibial fracture. Mild impaction at the fracture site within the proximal tibia is noted. Postsurgical changes are noted in the proximal tibia as well. Midshaft comminuted fibular fracture is seen. No distal tibial or fibular fracture is noted IMPRESSION: Comminuted proximal  and midshaft fibular fractures with comminuted proximal tibial fracture with impaction at the fracture site. Electronically Signed   By: Inez Catalina M.D.   On: 11/13/2018 21:09   Dg Ankle Complete Right  Result Date: 11/14/2018 CLINICAL DATA:  Right ankle pain. Proximal tibia and fibula fractures. EXAM: RIGHT ANKLE - COMPLETE 3+ VIEW COMPARISON:  Radiographs dated 11/13/2018 FINDINGS: There is no evidence of ankle fracture, dislocation, or joint effusion. There is an incompletely visualized fracture of the midshaft right fibula. There is no evidence of arthropathy or other focal bone abnormality. Soft tissues are unremarkable. IMPRESSION: Normal right ankle. Right mid fibula fracture as described on prior exams. Electronically Signed   By: Lorriane Shire M.D.   On: 11/14/2018 10:16   Ct Cervical Spine Wo Contrast  Result Date: 11/13/2018 CLINICAL DATA:  Fall.  Pain. EXAM: CT CERVICAL SPINE WITHOUT CONTRAST TECHNIQUE: Multidetector CT imaging of the cervical spine was performed without intravenous contrast. Multiplanar CT image reconstructions were also generated. COMPARISON:   02/20/2018 FINDINGS: Alignment: Spinal visualization through the bottom of T2. Maintenance of vertebral body height and alignment across these levels. Skull base and vertebrae: Left sided craniotomy with similar hyperattenuating left cerebellar rounded structure which could be postoperative. No acute fracture within the cervical spine identified. There is facet arthropathy bilaterally. C1-2 articulation within normal limits on coronal reformats Soft tissues and spinal canal: Prevertebral soft tissues are unremarkable. Disc levels: Loss of intervertebral disc height at multiple levels, most significant at C6-7. Upper chest: No apical pneumothorax. Other: None. IMPRESSION: Cervical spondylosis, without acute osseous abnormality. Electronically Signed   By: Abigail Miyamoto M.D.   On: 11/13/2018 20:59   Ct Knee Right Wo Contrast  Result Date: 11/13/2018 CLINICAL DATA:  Proximal tibial and fibular fractures, evaluate for possible articular involvement EXAM: CT OF THE RIGHT KNEE WITHOUT CONTRAST TECHNIQUE: Multidetector CT imaging of the right knee was performed according to the standard protocol. Multiplanar CT image reconstructions were also generated. COMPARISON:  None. FINDINGS: Bones/Joint/Cartilage Comminuted fracture of the proximal fibula is noted with mild impaction at the fracture site. Additionally significantly comminuted and impacted fracture of the proximal tibial metaphysis is noted. The fracture fragments extend into the proximal diaphysis as well. There is impaction of the posterolateral aspect of the tibia superiorly into the region of the tibial plateau although no definitive articular involvement is seen. Changes of prior ligament repair are noted with fixation staple in the anterior aspect of the tibia. Subchondral sclerosis is noted in the lateral tibial plateau as well as some mild degenerative changes of the knee joint. Distal femur is within normal limits. Midshaft fibular fracture is not  evaluated on this exam. Ligaments Suboptimally assessed by CT.  Cruciate ligaments appear intact. Muscles and Tendons No specific muscular abnormality is noted. No significant muscular hematoma is seen. Soft tissues Changes are noted in the subcutaneous tissues consistent with edema. No significant joint effusion is noted. IMPRESSION: Comminuted proximal tibial and fibular fractures with impaction worse in the proximal tibia with extension of posterior fragments to just below the articular surface within posterior aspect of the tibial plateaus. No definitive intra-articular involvement is seen. Electronically Signed   By: Inez Catalina M.D.   On: 11/13/2018 22:27   Dg Knee Complete 4 Views Right  Result Date: 11/14/2018 CLINICAL DATA:  Status post external fixation of right knee. EXAM: DG C-ARM 61-120 MIN; RIGHT KNEE - COMPLETE 4+ VIEW FLUOROSCOPY TIME:  17 seconds. COMPARISON:  Radiographs of November 13, 2018. FINDINGS:  Four intraoperative fluoroscopic images of the right lower extremity demonstrate external fixation of mildly displaced and probably comminuted proximal tibial fracture. Fixation screws are seen in the right femur and right tibia. IMPRESSION: Status post external fixation of mildly displaced and probably comminuted proximal right tibial fracture. Electronically Signed   By: Marijo Conception M.D.   On: 11/14/2018 14:27   Dg Knee Right Port  Result Date: 11/14/2018 CLINICAL DATA:  Postoperative check. EXAM: PORTABLE RIGHT KNEE - 1-2 VIEW COMPARISON:  11/14/2018 FINDINGS: Interval placement of an external fixator device with a fixation screw in the mid/distal femur and screws in the mid tibia. Again noted is the comminuted displaced fracture involving the proximal tibia and displaced fracture involving the proximal fibula. Again noted is surgical hardware in the anterior proximal tibia. IMPRESSION: Interval placement of an external fixator device in right lower extremity. Electronically Signed   By:  Markus Daft M.D.   On: 11/14/2018 15:47   Dg C-arm 1-60 Min  Result Date: 11/14/2018 CLINICAL DATA:  Status post external fixation of right knee. EXAM: DG C-ARM 61-120 MIN; RIGHT KNEE - COMPLETE 4+ VIEW FLUOROSCOPY TIME:  17 seconds. COMPARISON:  Radiographs of November 13, 2018. FINDINGS: Four intraoperative fluoroscopic images of the right lower extremity demonstrate external fixation of mildly displaced and probably comminuted proximal tibial fracture. Fixation screws are seen in the right femur and right tibia. IMPRESSION: Status post external fixation of mildly displaced and probably comminuted proximal right tibial fracture. Electronically Signed   By: Marijo Conception M.D.   On: 11/14/2018 14:27     CBC Recent Labs  Lab 11/13/18 2019 11/15/18 0127  WBC 14.8* 7.6  HGB 12.4 10.1*  HCT 38.1 30.7*  PLT 294 298  MCV 90.5 89.8  MCH 29.5 29.5  MCHC 32.5 32.9  RDW 13.2 13.2  LYMPHSABS 1.5  --   MONOABS 0.8  --   EOSABS 0.1  --   BASOSABS 0.0  --     Chemistries  Recent Labs  Lab 11/13/18 2019 11/15/18 0127  NA 137 135  K 4.0 3.9  CL 106 100  CO2 22 25  GLUCOSE 116* 122*  BUN 12 10  CREATININE 0.85 0.71  CALCIUM 9.2 8.9   ------------------------------------------------------------------------------------------------------------------ No results for input(s): CHOL, HDL, LDLCALC, TRIG, CHOLHDL, LDLDIRECT in the last 72 hours.  Lab Results  Component Value Date   HGBA1C (H) 11/25/2009    5.7 (NOTE)                                                                       According to the ADA Clinical Practice Recommendations for 2011, when HbA1c is used as a screening test:   >=6.5%   Diagnostic of Diabetes Mellitus           (if abnormal result  is confirmed)  5.7-6.4%   Increased risk of developing Diabetes Mellitus  References:Diagnosis and Classification of Diabetes Mellitus,Diabetes Care,2011,34(Suppl 1):S62-S69 and Standards of Medical Care in         Diabetes - 2011,Diabetes  OIZT,2458,09  (Suppl 1):S11-S61.   ------------------------------------------------------------------------------------------------------------------ No results for input(s): TSH, T4TOTAL, T3FREE, THYROIDAB in the last 72 hours.  Invalid input(s): FREET3 ------------------------------------------------------------------------------------------------------------------ No results for input(s): VITAMINB12, FOLATE, FERRITIN,  TIBC, IRON, RETICCTPCT in the last 72 hours.  Coagulation profile No results for input(s): INR, PROTIME in the last 168 hours.  No results for input(s): DDIMER in the last 72 hours.  Cardiac Enzymes No results for input(s): CKMB, TROPONINI, MYOGLOBIN in the last 168 hours.  Invalid input(s): CK ------------------------------------------------------------------------------------------------------------------ No results found for: BNP   Caitlin Irwin M.D on 11/15/2018 at 9:45 AM  Go to [email or web address] - for contact info  [facility] - Office  [phone]

## 2018-11-15 NOTE — Progress Notes (Signed)
Patient is refusing to urinate and is holding her urine as long as she can. She has a Purewick in place to facilitate ease of urination. She was requesting to be catheterized and this nurse explained to her that it is better for her to urinate on her own. Explained the risks of infection to the patient and she stated she would try to urinate with the [place]. Will continue to monitor.
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
## 2018-11-15 NOTE — TOC Progression Note (Addendum)
Transition of Care [facility]) - Progression Note    Patient Details  Name: Caitlin Irwin MRN: [id number] Date of Birth: 01-14-1951  Transition of [facility]) CM/SW Contact  Jacalyn Lefevre Edson Snowball, RN Phone Number: 11/15/2018, 9:59 AM  Clinical Narrative:    Patient from home alone. [place] taking her dog outside. Has one step between garage and kitchen. All her bedrooms are upstairs.  Has sister and brother in law who live in [place] , their bedrooms are on second story also. Neighbor taking care of her dog at present.   Patient was interested in [place] to stay with her at home when discharged but did not realize insurance does not cover.   PCP Dr Levin Erp.  Patient for possible surgery 11/16/18 pending swelling. Will follow up with patient after surgery and PT evaluation post op.   Confirmed face sheet information.  Expected Discharge Plan: [place] Barriers to Discharge: Continued Medical Work up  Expected Discharge Plan and Services Expected Discharge Plan: [place]   Discharge Planning Services: CM Consult   Living arrangements for the past 2 months: Single Family Home                           Social Determinants of Health (SDOH) Interventions    Readmission Risk Interventions No flowsheet data found.

## 2018-11-15 NOTE — Plan of Care (Signed)
  Problem: Education: Goal: Knowledge of General Education information will improve Description: Including pain rating scale, medication(s)/side effects and non-pharmacologic comfort measures Outcome: Progressing   Problem: Health Behavior/Discharge Planning: Goal: Ability to manage health-related needs will improve Outcome: Progressing   Problem: Clinical Measurements: Goal: Ability to maintain clinical measurements within normal limits will improve Outcome: Progressing Goal: Will remain free from infection Outcome: Progressing Goal: Diagnostic test results will improve Outcome: Progressing   Problem: Activity: Goal: Risk for activity intolerance will decrease Outcome: Progressing   Problem: Nutrition: Goal: Adequate nutrition will be maintained Outcome: Progressing   Problem: Coping: Goal: Level of anxiety will decrease Outcome: Progressing   Problem: Elimination: Goal: Will not experience complications related to bowel motility Outcome: Progressing Goal: Will not experience complications related to urinary retention Outcome: Progressing   Problem: Pain Managment: Goal: General experience of comfort will improve Outcome: Progressing   Problem: Safety: Goal: Ability to remain free from injury will improve Outcome: Progressing   

## 2018-11-15 NOTE — Progress Notes (Signed)
ORTHOPAEDIC PROGRESS NOTE  s/p Procedure(s): R EXTERNAL FIXATION LEG  SUBJECTIVE: Reports mild pain about operative site. No chest pain. No SOB. No nausea/vomiting. No other complaints.  OBJECTIVE: PE: Ex fix in place, wwp foot, no compartment syndrome  Vitals:   11/15/18 1044 11/15/18 1348  BP: (!) 121/55 99/62  Pulse: 85 77  Resp: 16 18  Temp: 98.4 F (36.9 C) 98.4 F (36.9 C)  SpO2: 93% 94%     ASSESSMENT: Caitlin Irwin is a 68 y.o. female doing well postoperatively.  PLAN: Plan for defintiive fixation pending swelling control tomorrow morning.  NPO at midnight, no lovenox tomorrow morning.

## 2018-11-16 ENCOUNTER — Inpatient Hospital Stay (HOSPITAL_COMMUNITY): Payer: PPO | Admitting: Certified Registered Nurse Anesthetist

## 2018-11-16 ENCOUNTER — Inpatient Hospital Stay (HOSPITAL_COMMUNITY): Payer: PPO

## 2018-11-16 ENCOUNTER — Encounter (HOSPITAL_COMMUNITY)
Admission: EM | Disposition: A | Discharge status: Skilled Nursing Facility | Payer: Self-pay | Source: Home / Self Care | Attending: Family Medicine

## 2018-11-16 ENCOUNTER — Encounter (HOSPITAL_COMMUNITY): Payer: Self-pay | Admitting: *Deleted

## 2018-11-16 DIAGNOSIS — D509 Iron deficiency anemia, unspecified: Secondary | ICD-10-CM | POA: Diagnosis not present

## 2018-11-16 DIAGNOSIS — E039 Hypothyroidism, unspecified: Secondary | ICD-10-CM | POA: Diagnosis not present

## 2018-11-16 DIAGNOSIS — R4182 Altered mental status, unspecified: Secondary | ICD-10-CM | POA: Diagnosis not present

## 2018-11-16 DIAGNOSIS — S82141A Displaced bicondylar fracture of right tibia, initial encounter for closed fracture: Secondary | ICD-10-CM | POA: Diagnosis not present

## 2018-11-16 HISTORY — PX: ORIF TIBIA PLATEAU: SHX2132

## 2018-11-16 LAB — CBC
HCT: 33 % — ABNORMAL LOW (ref 36.0–46.0)
Hemoglobin: 10.5 g/dL — ABNORMAL LOW (ref 12.0–15.0)
MCH: 28.7 pg (ref 26.0–34.0)
MCHC: 31.8 g/dL (ref 30.0–36.0)
MCV: 90.2 fL (ref 80.0–100.0)
Platelets: 303 10*3/uL (ref 150–400)
RBC: 3.66 MIL/uL — ABNORMAL LOW (ref 3.87–5.11)
RDW: 13.2 % (ref 11.5–15.5)
WBC: 8 10*3/uL (ref 4.0–10.5)
nRBC: 0 % (ref 0.0–0.2)

## 2018-11-16 LAB — BASIC METABOLIC PANEL
Anion gap: 6 (ref 5–15)
BUN: 9 mg/dL (ref 8–23)
CO2: 32 mmol/L (ref 22–32)
Calcium: 9.5 mg/dL (ref 8.9–10.3)
Chloride: 99 mmol/L (ref 98–111)
Creatinine, Ser: 0.67 mg/dL (ref 0.44–1.00)
GFR calc Af Amer: >60 mL/min (ref >60)
GFR calc non Af Amer: >60 mL/min (ref >60)
Glucose, Bld: 102 mg/dL — ABNORMAL HIGH (ref 70–99)
Potassium: 3.2 mmol/L — ABNORMAL LOW (ref 3.5–5.1)
Sodium: 137 mmol/L (ref 135–145)

## 2018-11-16 SURGERY — OPEN REDUCTION INTERNAL FIXATION (ORIF) TIBIAL PLATEAU
Anesthesia: General | Site: Leg Upper | Laterality: Right

## 2018-11-16 MED ORDER — LIDOCAINE 2% (20 MG/ML) 5 ML SYRINGE
INTRAMUSCULAR | Status: DC | PRN
  Administered 2018-11-16: 100 mg via INTRAVENOUS

## 2018-11-16 MED ORDER — PROPOFOL 500 MG/50ML IV EMUL
INTRAVENOUS | Status: DC | PRN
  Administered 2018-11-16: 50 ug/kg/min via INTRAVENOUS

## 2018-11-16 MED ORDER — FENTANYL CITRATE (PF) 250 MCG/5ML IJ SOLN
INTRAMUSCULAR | Status: AC
  Filled 2018-11-16: qty 5

## 2018-11-16 MED ORDER — TOBRAMYCIN SULFATE 1.2 G IJ SOLR
INTRAMUSCULAR | Status: DC | PRN
  Administered 2018-11-16: 1.2 g via TOPICAL

## 2018-11-16 MED ORDER — MIDAZOLAM HCL 2 MG/2ML IJ SOLN
INTRAMUSCULAR | Status: AC
  Filled 2018-11-16: qty 2

## 2018-11-16 MED ORDER — POTASSIUM CHLORIDE CRYS ER 20 MEQ PO TBCR
40.0000 meq | EXTENDED_RELEASE_TABLET | Freq: Once | ORAL | Status: AC
  Administered 2018-11-19: 10:00:00 40 meq via ORAL
  Filled 2018-11-16 (×2): qty 2

## 2018-11-16 MED ORDER — OXYCODONE HCL 5 MG PO TABS
ORAL_TABLET | ORAL | Status: AC
  Filled 2018-11-16: qty 1

## 2018-11-16 MED ORDER — SODIUM CHLORIDE 0.9 % IV SOLN
INTRAVENOUS | Status: DC | PRN
  Administered 2018-11-16: 15 ug/min via INTRAVENOUS

## 2018-11-16 MED ORDER — ONDANSETRON HCL 4 MG/2ML IJ SOLN
INTRAMUSCULAR | Status: DC | PRN
  Administered 2018-11-16: 4 mg via INTRAVENOUS

## 2018-11-16 MED ORDER — TOBRAMYCIN SULFATE 1.2 G IJ SOLR
INTRAMUSCULAR | Status: AC
  Filled 2018-11-16: qty 1.2

## 2018-11-16 MED ORDER — LACTATED RINGERS IV SOLN: INTRAVENOUS | Status: DC

## 2018-11-16 MED ORDER — ACETAMINOPHEN 500 MG PO TABS: 1000.0000 mg | ORAL_TABLET | Freq: Three times a day (TID) | ORAL | 0 refills | Status: AC

## 2018-11-16 MED ORDER — RIVAROXABAN 10 MG PO TABS
10.0000 mg | ORAL_TABLET | Freq: Every day | ORAL | Status: DC
  Administered 2018-11-17 – 2018-11-20 (×4): 10 mg via ORAL
  Filled 2018-11-16 (×4): qty 1

## 2018-11-16 MED ORDER — HYDROMORPHONE HCL 1 MG/ML IJ SOLN
INTRAMUSCULAR | Status: AC
  Filled 2018-11-16: qty 1

## 2018-11-16 MED ORDER — OXYCODONE HCL 5 MG/5ML PO SOLN: 5.0000 mg | Freq: Once | ORAL | Status: AC | PRN

## 2018-11-16 MED ORDER — MIDAZOLAM HCL 2 MG/2ML IJ SOLN
INTRAMUSCULAR | Status: DC | PRN
  Administered 2018-11-16: 2 mg via INTRAVENOUS

## 2018-11-16 MED ORDER — CEFAZOLIN SODIUM-DEXTROSE 1-4 GM/50ML-% IV SOLN
1.0000 g | Freq: Three times a day (TID) | INTRAVENOUS | Status: AC
  Administered 2018-11-16 – 2018-11-17 (×2): 1 g via INTRAVENOUS
  Filled 2018-11-16 (×2): qty 50

## 2018-11-16 MED ORDER — EPHEDRINE SULFATE 50 MG/ML IJ SOLN
INTRAMUSCULAR | Status: DC | PRN
  Administered 2018-11-16: 15 mg via INTRAVENOUS

## 2018-11-16 MED ORDER — VANCOMYCIN HCL 1000 MG IV SOLR
INTRAVENOUS | Status: DC | PRN
  Administered 2018-11-16: 1000 mg via TOPICAL

## 2018-11-16 MED ORDER — LACTATED RINGERS IV SOLN
INTRAVENOUS | Status: DC | PRN
  Administered 2018-11-16: 10:00:00 via INTRAVENOUS

## 2018-11-16 MED ORDER — SUGAMMADEX SODIUM 200 MG/2ML IV SOLN
INTRAVENOUS | Status: DC | PRN
  Administered 2018-11-16: 100 mg via INTRAVENOUS

## 2018-11-16 MED ORDER — HYDROMORPHONE HCL 1 MG/ML IJ SOLN
0.2500 mg | INTRAMUSCULAR | Status: DC | PRN
  Administered 2018-11-16 (×3): 0.5 mg via INTRAVENOUS

## 2018-11-16 MED ORDER — SUCCINYLCHOLINE CHLORIDE 20 MG/ML IJ SOLN
INTRAMUSCULAR | Status: DC | PRN
  Administered 2018-11-16: 100 mg via INTRAVENOUS

## 2018-11-16 MED ORDER — 0.9 % SODIUM CHLORIDE (POUR BTL) OPTIME
TOPICAL | Status: DC | PRN
  Administered 2018-11-16: 1000 mL

## 2018-11-16 MED ORDER — OXYCODONE HCL 5 MG PO TABS: ORAL_TABLET | ORAL | 0 refills | Status: DC

## 2018-11-16 MED ORDER — DEXAMETHASONE SODIUM PHOSPHATE 10 MG/ML IJ SOLN
INTRAMUSCULAR | Status: DC | PRN
  Administered 2018-11-16: 10 mg via INTRAVENOUS

## 2018-11-16 MED ORDER — OXYCODONE HCL 5 MG PO TABS
5.0000 mg | ORAL_TABLET | Freq: Once | ORAL | Status: AC | PRN
  Administered 2018-11-16: 13:00:00 5 mg via ORAL

## 2018-11-16 MED ORDER — MEPERIDINE HCL 50 MG/ML IJ SOLN
6.2500 mg | INTRAMUSCULAR | Status: DC | PRN
  Administered 2018-11-16: 13:00:00 6.25 mg via INTRAVENOUS

## 2018-11-16 MED ORDER — POTASSIUM CHLORIDE CRYS ER 20 MEQ PO TBCR: 40.0000 meq | EXTENDED_RELEASE_TABLET | Freq: Once | ORAL | Status: DC

## 2018-11-16 MED ORDER — CELECOXIB 100 MG PO CAPS: 100.0000 mg | ORAL_CAPSULE | Freq: Two times a day (BID) | ORAL | 0 refills | Status: DC

## 2018-11-16 MED ORDER — PROPOFOL 10 MG/ML IV BOLUS
INTRAVENOUS | Status: AC
  Filled 2018-11-16: qty 20

## 2018-11-16 MED ORDER — DIPHENHYDRAMINE HCL 50 MG/ML IJ SOLN
INTRAMUSCULAR | Status: DC | PRN
  Administered 2018-11-16: 12.5 mg via INTRAVENOUS

## 2018-11-16 MED ORDER — RIVAROXABAN 10 MG PO TABS: 10.0000 mg | ORAL_TABLET | Freq: Every day | ORAL | 0 refills | Status: DC

## 2018-11-16 MED ORDER — ROCURONIUM BROMIDE 10 MG/ML (PF) SYRINGE
PREFILLED_SYRINGE | INTRAVENOUS | Status: DC | PRN
  Administered 2018-11-16: 40 mg via INTRAVENOUS

## 2018-11-16 MED ORDER — VANCOMYCIN HCL 1000 MG IV SOLR
INTRAVENOUS | Status: AC
  Filled 2018-11-16: qty 1000

## 2018-11-16 MED ORDER — FENTANYL CITRATE (PF) 250 MCG/5ML IJ SOLN
INTRAMUSCULAR | Status: DC | PRN
  Administered 2018-11-16: 25 ug via INTRAVENOUS
  Administered 2018-11-16: 75 ug via INTRAVENOUS
  Administered 2018-11-16: 25 ug via INTRAVENOUS
  Administered 2018-11-16: 50 ug via INTRAVENOUS
  Administered 2018-11-16: 25 ug via INTRAVENOUS
  Administered 2018-11-16: 75 ug via INTRAVENOUS
  Administered 2018-11-16 (×2): 25 ug via INTRAVENOUS
  Administered 2018-11-16: 50 ug via INTRAVENOUS
  Administered 2018-11-16: 25 ug via INTRAVENOUS

## 2018-11-16 MED ORDER — MEPERIDINE HCL 50 MG/ML IJ SOLN
INTRAMUSCULAR | Status: AC
  Filled 2018-11-16: qty 1

## 2018-11-16 MED ORDER — PROPOFOL 10 MG/ML IV BOLUS
INTRAVENOUS | Status: DC | PRN
  Administered 2018-11-16: 120 mg via INTRAVENOUS

## 2018-11-16 MED ORDER — PHENYLEPHRINE 40 MCG/ML (10ML) SYRINGE FOR IV PUSH (FOR BLOOD PRESSURE SUPPORT)
PREFILLED_SYRINGE | INTRAVENOUS | Status: DC | PRN
  Administered 2018-11-16 (×2): 80 ug via INTRAVENOUS

## 2018-11-16 MED ORDER — PROMETHAZINE HCL 25 MG/ML IJ SOLN: 6.2500 mg | INTRAMUSCULAR | Status: DC | PRN

## 2018-11-16 SURGICAL SUPPLY — 78 items
BANDAGE ACE 6X5 VEL STRL LF (GAUZE/BANDAGES/DRESSINGS) ×2 IMPLANT
BANDAGE ESMARK 6X9 LF (GAUZE/BANDAGES/DRESSINGS) ×1 IMPLANT
BIT DRILL 3.3 LONG (BIT) ×1 IMPLANT
BIT DRILL QC 3.3X195 (BIT) ×1 IMPLANT
BIT DRILL QC NCB 3.3X240 (BIT) ×1 IMPLANT
BLADE SURG 10 STRL SS (BLADE) ×2 IMPLANT
BLADE SURG 15 STRL LF DISP TIS (BLADE) ×1 IMPLANT
BLADE SURG 15 STRL SS (BLADE) ×2
BNDG CMPR 9X6 STRL LF SNTH (GAUZE/BANDAGES/DRESSINGS) ×1
BNDG CMPR MED 15X6 ELC VLCR LF (GAUZE/BANDAGES/DRESSINGS) ×1
BNDG COHESIVE 4X5 TAN STRL (GAUZE/BANDAGES/DRESSINGS) ×2 IMPLANT
BNDG COHESIVE 6X5 TAN STRL LF (GAUZE/BANDAGES/DRESSINGS) ×2 IMPLANT
BNDG ELASTIC 6X15 VLCR STRL LF (GAUZE/BANDAGES/DRESSINGS) ×1 IMPLANT
BNDG ESMARK 6X9 LF (GAUZE/BANDAGES/DRESSINGS) ×2
BNDG GAUZE ELAST 4 BULKY (GAUZE/BANDAGES/DRESSINGS) ×2 IMPLANT
CAP LOCK NCB (Cap) ×6 IMPLANT
CHLORAPREP W/TINT 26ML (MISCELLANEOUS) ×6 IMPLANT
COVER MAYO STAND STRL (DRAPES) ×2 IMPLANT
COVER SURGICAL LIGHT HANDLE (MISCELLANEOUS) ×2 IMPLANT
COVER WAND RF STERILE (DRAPES) ×2 IMPLANT
CUFF TOURNIQUET SINGLE 34IN LL (TOURNIQUET CUFF) IMPLANT
CUFF TOURNIQUET SINGLE 44IN (TOURNIQUET CUFF) IMPLANT
DRAPE C-ARM 42X72 X-RAY (DRAPES) ×2 IMPLANT
DRAPE C-ARMOR (DRAPES) ×2 IMPLANT
DRAPE IMP U-DRAPE 54X76 (DRAPES) ×4 IMPLANT
DRAPE INCISE IOBAN 66X45 STRL (DRAPES) ×2 IMPLANT
DRAPE U-SHAPE 47X51 STRL (DRAPES) ×2 IMPLANT
DRSG AQUACEL AG ADV 3.5X14 (GAUZE/BANDAGES/DRESSINGS) ×1 IMPLANT
DRSG PAD ABDOMINAL 8X10 ST (GAUZE/BANDAGES/DRESSINGS) ×2 IMPLANT
ELECT REM PT RETURN 9FT ADLT (ELECTROSURGICAL) ×2
ELECTRODE REM PT RTRN 9FT ADLT (ELECTROSURGICAL) ×1 IMPLANT
GAUZE SPONGE 4X4 12PLY STRL (GAUZE/BANDAGES/DRESSINGS) ×2 IMPLANT
GAUZE SPONGE 4X4 12PLY STRL LF (GAUZE/BANDAGES/DRESSINGS) ×2 IMPLANT
GAUZE XEROFORM 1X8 LF (GAUZE/BANDAGES/DRESSINGS) ×2 IMPLANT
GLOVE BIOGEL PI IND STRL 8 (GLOVE) ×1 IMPLANT
GLOVE BIOGEL PI INDICATOR 8 (GLOVE) ×1
GLOVE ECLIPSE 8.0 STRL XLNG CF (GLOVE) ×4 IMPLANT
GOWN STRL REUS W/ TWL LRG LVL3 (GOWN DISPOSABLE) ×2 IMPLANT
GOWN STRL REUS W/ TWL XL LVL3 (GOWN DISPOSABLE) ×2 IMPLANT
GOWN STRL REUS W/TWL LRG LVL3 (GOWN DISPOSABLE) ×4
GOWN STRL REUS W/TWL XL LVL3 (GOWN DISPOSABLE) ×4
GUIDEPIN ORTHO SS TH 1.6X190 (PIN) ×1 IMPLANT
IMMOBILIZER KNEE 22 UNIV (SOFTGOODS) IMPLANT
K-WIRE 2.0 (WIRE) ×8
K-WIRE FXSTD 280X2XNS SS (WIRE) ×4
KIT BASIN OR (CUSTOM PROCEDURE TRAY) ×2 IMPLANT
KIT TURNOVER KIT B (KITS) ×2 IMPLANT
KWIRE FXSTD 280X2XNS SS (WIRE) IMPLANT
MANIFOLD NEPTUNE II (INSTRUMENTS) ×2 IMPLANT
NS IRRIG 1000ML POUR BTL (IV SOLUTION) ×2 IMPLANT
PACK ORTHO EXTREMITY (CUSTOM PROCEDURE TRAY) ×4 IMPLANT
PAD ABD 7.5X8 STRL (GAUZE/BANDAGES/DRESSINGS) ×1 IMPLANT
PAD ARMBOARD 7.5X6 YLW CONV (MISCELLANEOUS) ×4 IMPLANT
PAD CAST 4YDX4 CTTN HI CHSV (CAST SUPPLIES) ×1 IMPLANT
PADDING CAST COTTON 4X4 STRL (CAST SUPPLIES) ×2
PADDING CAST COTTON 6X4 STRL (CAST SUPPLIES) ×2 IMPLANT
PLATE NCB LAT PROX 3H TIBIA 7H (Plate) ×1 IMPLANT
SCREW HUM NCB PA ST 4X60 (Screw) ×1 IMPLANT
SCREW NCB 4.0MX30M (Screw) ×1 IMPLANT
SCREW NCB 4.0X26MM (Screw) ×2 IMPLANT
SCREW NCB 4X3 4X70 (Screw) ×2 IMPLANT
SPONGE LAP 18X18 RF (DISPOSABLE) IMPLANT
STAPLER VISISTAT 35W (STAPLE) ×2 IMPLANT
STOCKINETTE IMPERVIOUS LG (DRAPES) ×2 IMPLANT
STRIP CLOSURE SKIN 1/2X4 (GAUZE/BANDAGES/DRESSINGS) ×1 IMPLANT
SUCTION FRAZIER HANDLE 10FR (MISCELLANEOUS) ×1
SUCTION TUBE FRAZIER 10FR DISP (MISCELLANEOUS) ×1 IMPLANT
SUT MON AB 3-0 SH 27 (SUTURE) ×2
SUT MON AB 3-0 SH27 (SUTURE) ×1 IMPLANT
SUT VIC AB 0 CT1 18XCR BRD 8 (SUTURE) ×1 IMPLANT
SUT VIC AB 0 CT1 8-18 (SUTURE) ×2
SUT VIC AB 1 CT1 27 (SUTURE) ×2
SUT VIC AB 1 CT1 27XBRD ANBCTR (SUTURE) ×1 IMPLANT
TOWEL OR 17X24 6PK STRL BLUE (TOWEL DISPOSABLE) ×2 IMPLANT
TOWEL OR 17X26 10 PK STRL BLUE (TOWEL DISPOSABLE) ×4 IMPLANT
TUBE CONNECTING 12X1/4 (SUCTIONS) ×2 IMPLANT
WATER STERILE IRR 1000ML POUR (IV SOLUTION) ×2 IMPLANT
YANKAUER SUCT BULB TIP NO VENT (SUCTIONS) ×2 IMPLANT

## 2018-11-16 NOTE — Progress Notes (Signed)
OT Cancellation Note  Patient Details Name: Caitlin Irwin MRN: [id number] DOB: 01-31-1951   Cancelled Treatment:    Reason Eval/Treat Not Completed: Patient at procedure or test/ unavailable(OR today R LE ) OT to wait for updated orders after procedure   Jeri Modena, OTR/L  Acute Rehabilitation Services Pager: [phone] Office: [phone] .   Jeri Modena 11/16/2018, 8:48 AM

## 2018-11-16 NOTE — Anesthesia Procedure Notes (Signed)
Procedure Name: Intubation Date/Time: 11/16/2018 10:35 AM Performed by: Jearld Pies, CRNA Pre-anesthesia Checklist: Patient identified, Emergency Drugs available, Suction available and Patient being monitored Patient Re-evaluated:Patient Re-evaluated prior to induction Oxygen Delivery Method: Circle System Utilized Preoxygenation: Pre-oxygenation with 100% oxygen Induction Type: IV induction and Rapid sequence Laryngoscope Size: Mac and 3 Grade View: Grade I Tube type: Oral Tube size: 7.0 mm Number of attempts: 1 Airway Equipment and Method: Stylet and Oral airway Placement Confirmation: ETT inserted through vocal cords under direct vision,  positive ETCO2 and breath sounds checked- equal and bilateral Secured at: 20 cm Tube secured with: Tape Dental Injury: Teeth and Oropharynx as per pre-operative assessment

## 2018-11-16 NOTE — Transfer of Care (Signed)
Immediate Anesthesia Transfer of Care Note  Patient: Caitlin Irwin  Procedure(s) Performed: OPEN REDUCTION INTERNAL FIXATION (ORIF) TIBIAL PLATEAU (Right Leg Upper)  Patient Location: PACU  Anesthesia Type:General  Level of Consciousness: drowsy, patient cooperative and responds to stimulation  Airway & Oxygen Therapy: Patient Spontanous Breathing and Patient connected to face mask oxygen  Post-op Assessment: Report given to RN and Post -op Vital signs reviewed and stable  Post vital signs: Reviewed and stable  Last Vitals:  Vitals Value Taken Time  BP 104/74 11/16/2018 12:32 PM  Temp    Pulse 81 11/16/2018 12:37 PM  Resp 12 11/16/2018 12:37 PM  SpO2 97 % 11/16/2018 12:37 PM  Vitals shown include unvalidated device data.  Last Pain:  Vitals:   11/16/18 0832  TempSrc: Oral  PainSc:       Patients Stated Pain Goal: 3 (38/88/28 0034)  Complications: No apparent anesthesia complications

## 2018-11-16 NOTE — Op Note (Signed)
Orthopaedic Surgery Operative Note (CSN: [id number])  Caitlin Irwin  January 12, 1951 Date of Surgery: 11/13/2018 - 11/16/2018   Diagnoses:  Comminuted right proximal tibia fracture status post external fixation  Procedure: Right external fixator removal 20680 Right proximal tibial ORIF 81829   Operative Finding Successful completion of planned procedure.  Significantly comminuted fracture in the metadiaphyseal region.  We had good fixation proximally with screws that were passed between the tines of the staple that had been placed for lateral ligament pair years before.  Patient did have some continued lateral instability however were not sure how much of this gapping is chronic in this patient had previous ligament repair.  Post-operative plan: The patient will be nonweightbearing with a hinged brace unlocked for the first 6 weeks.  The patient will be readmitted to the hospitalist.  DVT prophylaxis Xarelto 10 mg a day starting tomorrow.  Pain control with PRN pain medication preferring oral medicines.  Follow up plan will be scheduled in approximately 7 days for incision check and XR.  Post-Op Diagnosis: Same Surgeons:Primary: Hiram Gash, MD Assistants: Joya Gaskins, OPAC Location: [facility] OR ROOM 06 Anesthesia: General Antibiotics: Ancef 2g preop, Vancomycin 1000 mg locally Tobra 1.2 g Tourniquet time: 60 Estimated Blood Loss: 50 mL Complications: None Specimens: None Implants: Implant Name Type Inv. Item Serial No. Manufacturer Lot No. LRB No. Used Action  PLATE NCB LAT PROX 3H TIBIA 7H - [id number] Plate PLATE NCB LAT PROX 3H TIBIA 7H  [employer](ORTH,TRAU,BIO,SG)  Right 1 Implanted  4.46mm screw   [id number]   Right 1 Implanted  31mmx70   [id number]   Right 1 Implanted  SCREW NCB 4.0X26MM - [id number] Screw SCREW NCB 4.0X26MM  [employer](ORTH,TRAU,BIO,SG)  Right 2 Implanted  SCREW HUM NCB PA ST 4X60 - [id number] Screw SCREW HUM NCB PA ST 4X60  [employer](ORTH,TRAU,BIO,SG)  Right 1  Implanted  CAP LOCK NCB - [id number] Cap CAP LOCK NCB  [employer](ORTH,TRAU,BIO,SG)  Right 6 Implanted    Indications for Surgery:   [name] is a 68 y.o. female with fall resulting in a comminuted proximal tibial fracture on the right with significant swelling and external fixator placement 2 days ago.  Skin improved and her swelling had improved and we felt that it was appropriate for definitive fixation at this point.  Benefits and risks of operative and nonoperative management were discussed prior to surgery with patient/guardian(s) and informed consent form was completed.  Specific risks including infection, need for additional surgery, periprosthetic fracture, nonunion, malunion, infection, stiffness, continued joint instability.   Procedure:   The patient was identified in the preoperative holding area where the surgical site was marked. The patient was taken to the OR where a procedural timeout was called and the above noted anesthesia was induced.  The patient was positioned supine on a radiolucent table.  Preoperative antibiotics were dosed.  The patient's right knee was prepped and draped in the usual sterile fashion.  A second preoperative timeout was called.      A tourniquet was used for the above listed time.  Began by using the distal aspect of her previously placed incision.  We used minimally invasive techniques to perform this procedure.  Went to the skin sharply achieving hemostasis we progressed and identified the IT band.  We peeled the IT band away from the Gertie's tubercle splitting it in its mid substance longitudinally.  This point were able to identify the joint and since this was a relatively extra-articular fracture we  felt that we did not need to assess the joint based on her CT examinations prior to surgery.  This point we performed gross reduction maneuvers over a radiolucent triangle using towels and bumps to obtain a appropriate alignment of the shaft relative  to the joint.  The joint was anatomically reduced.  We placed provisional K wires.  At this point we used a [employer] NCB plate with minimally invasive techniques including the outrigger guide to perform fixation.  The patient had an anterior to posterior staple placed years ago we felt that removing the staple could cause significant bone loss.  We instead used the cannulated drill system within the NCB plate to pass a wire and then a cannulated screw between the tines of the staple shooting through the plate and obtained great purchase with this.  We repeated this step 3 more times for the proximal screws.  All of the screws obtained locking caps.  At that point we used the outrigger guide to place 3 screws in the shaft.  2 of these were locked with the most distal unlocked.  Final fluoroscopic imaging demonstrated a appropriate reduction of the shaft distally to the joint with good alignment.  The patient did have some lateral ligament laxity however this may be chronic with her previous ligament repair.  The incision was thoroughly irrigated and closed in a multilayer fashion with absorbable sutures. A sterile dressing was placed.   The patient was awoken from general anesthesia and taken to the PACU in stable condition without complication.   Joya Gaskins, OPA-C, present and scrubbed throughout the case, critical for completion in a timely fashion, and for retraction, instrumentation, closure.

## 2018-11-16 NOTE — Progress Notes (Addendum)
Rehab Admissions Coordinator Note:  Patient was screened by Jhonnie Garner for appropriateness for an Inpatient Acute Rehab Consult. Currently, pt does not have the medical complexity to warrant an IP Rehab stay and thus would not qualify for CIR. AC will follow up with CM regarding recommendations.    Jhonnie Garner 11/16/2018, 8:01 AM  I can be reached at [phone].

## 2018-11-16 NOTE — Progress Notes (Signed)
Patient Demographics:    Caitlin Irwin, is a 68 y.o. female, DOB - 04/09/1951, CHY:[id number]  Admit date - 11/13/2018   Admitting Physician Etta Quill, DO  Outpatient Primary MD for the patient is Levin Erp, MD  LOS - 3  Chief Complaint  Patient presents with   Fall        Subjective:    [name] today has no fevers, no emesis,  No chest pain, resting comfortably postop, somewhat sleepy, RN at bedside  Assessment  & Plan :    Principal Problem:   Closed fracture of proximal end of right tibia and fibula Active Problems:   Hx of migraine headaches   Depression   History of hypothyroidism  Brief summary 68 year old female with past medical history of anxiety and depression plus chronic migraines who admitted on 11/13/2018 after mechanical fall while walking her dog leading to right tib-fib fracture   A/p 1)Status post mechanical fall with closed fracture of proximal end of right tibia and fibula:--  S/p external fixation on 11/14/18,  S/p  definitive fixation on Friday 11/16/18 ,  patient will be nonweightbearing with a hinged brace unlocked for the first 6 weeks.   .  DVT prophylaxis Xarelto 10 mg a day starting 11/17/18.  Pain control with PRN pain medication preferring oral medicines.  Follow up with ortho  will be scheduled in approximately 7 days for incision check and XRay.  2)Acute Blood Loss Anemia in a post-op pt--- Hgb down to 10.5 from 12.4... Suspect due to fracture on blood loss associated with initial fracture on postop blood loss.... Monitor closely and transfuse as clinically indicated  3)  Depression/Anxiety: Stable,  c/n trazodone nightly Xanax 0.5 mg po bid prn  4)  History of hypothyroidism: Continue Synthroid, once p.o.  5) Hx of migraine headaches--- Imitrex as needed  Disposition/Need for in-Hospital Stay- patient unable to be discharged at this time due s/p  operative/definitive fixation of right tib-fib fracture on 11/16/2018  Code Status : Full Code  Family Communication:   na  Disposition Plan  : TBD  Consultants:  Orthopedics  Procedures:  External fixation of right knee done 4/15  Plan definitive fixation 4/17  Antimicrobials:  Preop Ancef  DVT prophylaxis: Lovenox   Lab Results  Component Value Date   PLT 303 11/16/2018    Inpatient Medications  Scheduled Meds:  acetaminophen  1,000 mg Oral Q8H   HYDROmorphone       HYDROmorphone       levothyroxine  75 mcg Oral Q0600   meperidine       oxyCODONE       potassium chloride  40 mEq Oral Once   potassium chloride  40 mEq Oral Once   [START ON 11/17/2018] rivaroxaban  10 mg Oral Daily   senna-docusate  2 tablet Oral BID   traZODone  100 mg Oral QHS   Continuous Infusions:   ceFAZolin (ANCEF) IV     lactated ringers 10 mL/hr at 11/16/18 0854   lactated ringers     PRN Meds:.ALPRAZolam, HYDROmorphone (DILAUDID) injection, menthol-cetylpyridinium **OR** phenol, metoCLOPramide **OR** metoCLOPramide (REGLAN) injection, ondansetron (ZOFRAN) IV, oxyCODONE    Anti-infectives (From admission, onward)   Start     Dose/Rate Route Frequency  Ordered Stop   11/16/18 1800  ceFAZolin (ANCEF) IVPB 1 g/50 mL premix     1 g 100 mL/hr over 30 Minutes Intravenous Every 8 hours 11/16/18 1505 11/17/18 0959   11/16/18 1202  vancomycin (VANCOCIN) powder  Status:  Discontinued       As needed 11/16/18 1202 11/16/18 1226   11/16/18 1201  tobramycin (NEBCIN) powder  Status:  Discontinued       As needed 11/16/18 1201 11/16/18 1226   11/16/18 0600  ceFAZolin (ANCEF) IVPB 2g/100 mL premix     2 g 200 mL/hr over 30 Minutes Intravenous To Short Stay 11/15/18 1727 11/16/18 1055   11/14/18 1830  ceFAZolin (ANCEF) IVPB 2g/100 mL premix     2 g 200 mL/hr over 30 Minutes Intravenous Every 6 hours 11/14/18 1618 11/15/18 0028   11/14/18 1130  ceFAZolin (ANCEF) IVPB  2g/100 mL premix     2 g 200 mL/hr over 30 Minutes Intravenous On call to O.R. 11/14/18 1120 11/14/18 1201        Objective:   Vitals:   11/16/18 1345 11/16/18 1400 11/16/18 1402 11/16/18 1418  BP:  (!) 145/90 (!) 145/90 127/66  Pulse: (!) 103 (!) 108 (!) 114 (!) 106  Resp: 19 18 16 16   Temp:  98.3 F (36.8 C)  98.3 F (36.8 C)  TempSrc:    Oral  SpO2: 93% 96% 97% 96%  Weight:      Height:        Wt Readings from Last 3 Encounters:  11/16/18 49.2 kg  04/20/17 50.8 kg  03/30/17 45.4 kg    Intake/Output Summary (Last 24 hours) at 11/16/2018 1559 Last data filed at 11/16/2018 1539 Gross per 24 hour  Intake 1390.82 ml  Output 875 ml  Net 515.82 ml   Physical Exam Patient is examined daily including today on 11/16/18 , exams remain the same as of yesterday except that has changed   Gen:-sleepy post op HEENT:- [place].AT, No sclera icterus Neck-Supple Neck,No JVD,.  Lungs-  CTAB , fair symmetrical air movement CV- S1, S2 normal, regular  Abd-  +ve B.Sounds, Abd Soft, No tenderness,    Extremity/Skin:-   pedal pulses present .,  Right leg splinted/dressing Psych-affect is appropriate, oriented x3 Neuro-no new focal deficits, no tremors   Data Review:   Micro Results Recent Results (from the past 240 hour(s))  Surgical pcr screen     Status: None   Collection Time: 11/14/18 12:25 AM  Result Value Ref Range Status   MRSA, PCR NEGATIVE NEGATIVE Final   Staphylococcus aureus NEGATIVE NEGATIVE Final    Comment: (NOTE) The Xpert SA Assay (FDA approved for NASAL specimens in patients 29 years of age and older), is one component of a comprehensive surveillance program. It is not intended to diagnose infection nor to guide or monitor treatment. Performed at [facility], [place] [address], [place], [place] [address]     Radiology Reports Dg Chest 1 View  Result Date: 11/13/2018 CLINICAL DATA:  Pt c/o proximal and central right lower leg pain after falling down steps  in her home today. Hx of previous right knee surgery to repair ligaments. No chest complaints. No hx of heart or lung problems. Pt is a nonsmoker. Patient and tech wore masks. EXAM: CHEST  1 VIEW COMPARISON:  03/30/2017 FINDINGS: Numerous leads and wires project over the chest. Patient rotated left. Midline trachea. Mild cardiomegaly. Remote posterolateral left eighth rib fracture. No pleural effusion or pneumothorax. Clear  lungs. IMPRESSION: No acute cardiopulmonary disease. Electronically Signed   By: Abigail Miyamoto M.D.   On: 11/13/2018 21:13   Dg Knee 2 Views Right  Result Date: 11/13/2018 CLINICAL DATA:  Initial encounter for trauma and pain. EXAM: RIGHT KNEE - 1-2 VIEW COMPARISON:  None. FINDINGS: AP and lateral views. Comminuted proximal tibia and fibular fractures. The proximal tibial fracture may extend to the joint surface, but is suboptimally evaluated. Oblique component extends approximately 8 cm distal to the tibial plateau. No significant knee joint effusion. There is pretibial soft tissue swelling. Ligament attachment site about the anterior tibia may indicate prior ACL repair. IMPRESSION: Comminuted proximal tibia and fibular fractures. Consider CT to evaluate for intra-articular extension at the tibial plateau. Electronically Signed   By: Abigail Miyamoto M.D.   On: 11/13/2018 21:16   Dg Tibia/fibula Right  Result Date: 11/13/2018 CLINICAL DATA:  Recent fall with knee pain, initial encounter EXAM: RIGHT TIBIA AND FIBULA - 2 VIEW COMPARISON:  None. FINDINGS: Comminuted proximal fibular fracture is noted as well as comminuted proximal tibial fracture. Mild impaction at the fracture site within the proximal tibia is noted. Postsurgical changes are noted in the proximal tibia as well. Midshaft comminuted fibular fracture is seen. No distal tibial or fibular fracture is noted IMPRESSION: Comminuted proximal and midshaft fibular fractures with comminuted proximal tibial fracture with impaction at the  fracture site. Electronically Signed   By: Inez Catalina M.D.   On: 11/13/2018 21:09   Dg Ankle Complete Right  Result Date: 11/14/2018 CLINICAL DATA:  Right ankle pain. Proximal tibia and fibula fractures. EXAM: RIGHT ANKLE - COMPLETE 3+ VIEW COMPARISON:  Radiographs dated 11/13/2018 FINDINGS: There is no evidence of ankle fracture, dislocation, or joint effusion. There is an incompletely visualized fracture of the midshaft right fibula. There is no evidence of arthropathy or other focal bone abnormality. Soft tissues are unremarkable. IMPRESSION: Normal right ankle. Right mid fibula fracture as described on prior exams. Electronically Signed   By: Lorriane Shire M.D.   On: 11/14/2018 10:16   Ct Cervical Spine Wo Contrast  Result Date: 11/13/2018 CLINICAL DATA:  Fall.  Pain. EXAM: CT CERVICAL SPINE WITHOUT CONTRAST TECHNIQUE: Multidetector CT imaging of the cervical spine was performed without intravenous contrast. Multiplanar CT image reconstructions were also generated. COMPARISON:  02/20/2018 FINDINGS: Alignment: Spinal visualization through the bottom of T2. Maintenance of vertebral body height and alignment across these levels. Skull base and vertebrae: Left sided craniotomy with similar hyperattenuating left cerebellar rounded structure which could be postoperative. No acute fracture within the cervical spine identified. There is facet arthropathy bilaterally. C1-2 articulation within normal limits on coronal reformats Soft tissues and spinal canal: Prevertebral soft tissues are unremarkable. Disc levels: Loss of intervertebral disc height at multiple levels, most significant at C6-7. Upper chest: No apical pneumothorax. Other: None. IMPRESSION: Cervical spondylosis, without acute osseous abnormality. Electronically Signed   By: Abigail Miyamoto M.D.   On: 11/13/2018 20:59   Ct Knee Right Wo Contrast  Result Date: 11/13/2018 CLINICAL DATA:  Proximal tibial and fibular fractures, evaluate for possible  articular involvement EXAM: CT OF THE RIGHT KNEE WITHOUT CONTRAST TECHNIQUE: Multidetector CT imaging of the right knee was performed according to the standard protocol. Multiplanar CT image reconstructions were also generated. COMPARISON:  None. FINDINGS: Bones/Joint/Cartilage Comminuted fracture of the proximal fibula is noted with mild impaction at the fracture site. Additionally significantly comminuted and impacted fracture of the proximal tibial metaphysis is noted. The fracture fragments extend into  the proximal diaphysis as well. There is impaction of the posterolateral aspect of the tibia superiorly into the region of the tibial plateau although no definitive articular involvement is seen. Changes of prior ligament repair are noted with fixation staple in the anterior aspect of the tibia. Subchondral sclerosis is noted in the lateral tibial plateau as well as some mild degenerative changes of the knee joint. Distal femur is within normal limits. Midshaft fibular fracture is not evaluated on this exam. Ligaments Suboptimally assessed by CT.  Cruciate ligaments appear intact. Muscles and Tendons No specific muscular abnormality is noted. No significant muscular hematoma is seen. Soft tissues Changes are noted in the subcutaneous tissues consistent with edema. No significant joint effusion is noted. IMPRESSION: Comminuted proximal tibial and fibular fractures with impaction worse in the proximal tibia with extension of posterior fragments to just below the articular surface within posterior aspect of the tibial plateaus. No definitive intra-articular involvement is seen. Electronically Signed   By: Inez Catalina M.D.   On: 11/13/2018 22:27   Dg Knee Complete 4 Views Right  Result Date: 11/16/2018 CLINICAL DATA:  Open reduction and internal fixation of right tibial plateau fracture. EXAM: RIGHT KNEE - COMPLETE 4+ VIEW; DG C-ARM 61-120 MIN Radiation exposure index: 5.59 mGy. COMPARISON:  Radiograph of November 14, 2018. FINDINGS: Five intraoperative fluoroscopic images were obtained of the right knee. These demonstrate surgical internal fixation of right tibial plateau fracture. Good alignment of fracture components is noted. IMPRESSION: Status post surgical internal fixation of right tibial plateau fracture. Electronically Signed   By: Marijo Conception M.D.   On: 11/16/2018 12:18   Dg Knee Complete 4 Views Right  Result Date: 11/14/2018 CLINICAL DATA:  Status post external fixation of right knee. EXAM: DG C-ARM 61-120 MIN; RIGHT KNEE - COMPLETE 4+ VIEW FLUOROSCOPY TIME:  17 seconds. COMPARISON:  Radiographs of November 13, 2018. FINDINGS: Four intraoperative fluoroscopic images of the right lower extremity demonstrate external fixation of mildly displaced and probably comminuted proximal tibial fracture. Fixation screws are seen in the right femur and right tibia. IMPRESSION: Status post external fixation of mildly displaced and probably comminuted proximal right tibial fracture. Electronically Signed   By: Marijo Conception M.D.   On: 11/14/2018 14:27   Dg Knee Right [place]  Result Date: 11/16/2018 CLINICAL DATA:  Postop ORIF. EXAM: PORTABLE RIGHT KNEE - 1-2 VIEW COMPARISON:  11/16/2018 FINDINGS: Two-view show ORIF for tibial plateau fracture with lateral plate and screws as well as a tibial plateau staple. Proximal fibular fracture as shown previously. IMPRESSION: No complication seen following ORIF for comminuted proximal tibial fracture Electronically Signed   By: Nelson Chimes M.D.   On: 11/16/2018 13:43   Dg Knee Right Port  Result Date: 11/14/2018 CLINICAL DATA:  Postoperative check. EXAM: PORTABLE RIGHT KNEE - 1-2 VIEW COMPARISON:  11/14/2018 FINDINGS: Interval placement of an external fixator device with a fixation screw in the mid/distal femur and screws in the mid tibia. Again noted is the comminuted displaced fracture involving the proximal tibia and displaced fracture involving the proximal fibula. Again  noted is surgical hardware in the anterior proximal tibia. IMPRESSION: Interval placement of an external fixator device in right lower extremity. Electronically Signed   By: Markus Daft M.D.   On: 11/14/2018 15:47   Dg C-arm 1-60 Min  Result Date: 11/16/2018 CLINICAL DATA:  Open reduction and internal fixation of right tibial plateau fracture. EXAM: RIGHT KNEE - COMPLETE 4+ VIEW; DG C-ARM 61-120 MIN  Radiation exposure index: 5.59 mGy. COMPARISON:  Radiograph of November 14, 2018. FINDINGS: Five intraoperative fluoroscopic images were obtained of the right knee. These demonstrate surgical internal fixation of right tibial plateau fracture. Good alignment of fracture components is noted. IMPRESSION: Status post surgical internal fixation of right tibial plateau fracture. Electronically Signed   By: Marijo Conception M.D.   On: 11/16/2018 12:18   Dg C-arm 1-60 Min  Result Date: 11/14/2018 CLINICAL DATA:  Status post external fixation of right knee. EXAM: DG C-ARM 61-120 MIN; RIGHT KNEE - COMPLETE 4+ VIEW FLUOROSCOPY TIME:  17 seconds. COMPARISON:  Radiographs of November 13, 2018. FINDINGS: Four intraoperative fluoroscopic images of the right lower extremity demonstrate external fixation of mildly displaced and probably comminuted proximal tibial fracture. Fixation screws are seen in the right femur and right tibia. IMPRESSION: Status post external fixation of mildly displaced and probably comminuted proximal right tibial fracture. Electronically Signed   By: Marijo Conception M.D.   On: 11/14/2018 14:27     CBC Recent Labs  Lab 11/13/18 2019 11/15/18 0127 11/16/18 0129  WBC 14.8* 7.6 8.0  HGB 12.4 10.1* 10.5*  HCT 38.1 30.7* 33.0*  PLT 294 298 303  MCV 90.5 89.8 90.2  MCH 29.5 29.5 28.7  MCHC 32.5 32.9 31.8  RDW 13.2 13.2 13.2  LYMPHSABS 1.5  --   --   MONOABS 0.8  --   --   EOSABS 0.1  --   --   BASOSABS 0.0  --   --     Chemistries  Recent Labs  Lab 11/13/18 2019 11/15/18 0127 11/16/18 0129    NA 137 135 137  K 4.0 3.9 3.2*  CL 106 100 99  CO2 22 25 32  GLUCOSE 116* 122* 102*  BUN 12 10 9   CREATININE 0.85 0.71 0.67  CALCIUM 9.2 8.9 9.5   ------------------------------------------------------------------------------------------------------------------ No results for input(s): CHOL, HDL, LDLCALC, TRIG, CHOLHDL, LDLDIRECT in the last 72 hours.  Lab Results  Component Value Date   HGBA1C (H) 11/25/2009    5.7 (NOTE)                                                                       According to the ADA Clinical Practice Recommendations for 2011, when HbA1c is used as a screening test:   >=6.5%   Diagnostic of Diabetes Mellitus           (if abnormal result  is confirmed)  5.7-6.4%   Increased risk of developing Diabetes Mellitus  References:Diagnosis and Classification of Diabetes Mellitus,Diabetes Care,2011,34(Suppl 1):S62-S69 and Standards of Medical Care in         Diabetes - 2011,Diabetes GUYQ,0347,42  (Suppl 1):S11-S61.   ------------------------------------------------------------------------------------------------------------------ No results for input(s): TSH, T4TOTAL, T3FREE, THYROIDAB in the last 72 hours.  Invalid input(s): FREET3 ------------------------------------------------------------------------------------------------------------------ No results for input(s): VITAMINB12, FOLATE, FERRITIN, TIBC, IRON, RETICCTPCT in the last 72 hours.  Coagulation profile No results for input(s): INR, PROTIME in the last 168 hours.  No results for input(s): DDIMER in the last 72 hours.  Cardiac Enzymes No results for input(s): CKMB, TROPONINI, MYOGLOBIN in the last 168 hours.  Invalid input(s): CK ------------------------------------------------------------------------------------------------------------------ No results found for: BNP   Roxan Hockey M.D on 11/16/2018  at 3:59 PM  Go to [email or web address] - for contact info  [facility] - Office   [phone]

## 2018-11-16 NOTE — Progress Notes (Signed)
Pt seems calmer now, responds approp. to commands.  Dr Lissa Hoard here to see pt and fully updated. OK to tx back to floor.

## 2018-11-16 NOTE — Progress Notes (Signed)
ORTHOPAEDIC PROGRESS NOTE  s/p Procedure(s): R EXTERNAL FIXATION LEG  SUBJECTIVE: Swelling improved.    OBJECTIVE: PE: Ex fix in place, wwp foot, no compartment syndrome  Vitals:   11/15/18 2140 11/16/18 0520  BP: 103/67 112/70  Pulse: 75 77  Resp: 18 18  Temp: 98 F (36.7 C) 98.2 F (36.8 C)  SpO2: 96% 97%     ASSESSMENT: Caitlin Irwin is a 68 y.o. female doing well postoperatively.  PLAN: Swelling improved, plan for OR for definitive fixation today.

## 2018-11-16 NOTE — NC FL2 (Signed)
[place] LEVEL OF CARE SCREENING TOOL     IDENTIFICATION  Patient Name: Caitlin Irwin Birthdate: 1951-01-22 Sex: female Admission Date (Current Location): 11/13/2018  [facility] and [place] Number:  Herbalist and Address:  [facility]. [facility], [place] [address], [place], [place] [address]      Provider Number: [id number]  Attending Physician Name and Address:  Roxan Hockey, MD  Relative Name and Phone Number:  brother Cheryln Manly [phone], Henderson Cloud sister in law [phone]    Current Level of Care: Hospital Recommended Level of Care: [place] Prior Approval Number:    Date Approved/Denied:   PASRR Number: [id number]  Discharge Plan: SNF    Current Diagnoses: Patient Active Problem List   Diagnosis Date Noted  . Closed fracture of proximal end of right tibia and fibula 11/13/2018  . Epigastric hernia 09/15/2015  . Migraine, intractable 05/28/2015  . Migraine 05/28/2015  . Headache, migraine   . Insomnia   . Emesis   . History of hypothyroidism   . Microcytic hypochromic anemia   . Salicylate poisoning 01/60/1093  . Migraine headache 08/28/2014  . Hypokalemia 08/28/2014  . Salicylate intoxication 08/28/2014  . Aspirin toxicity   . Ataxia 03/24/2014  . Complicated grief 23/55/7322  . Altered mental status, unspecified altered mental status type 10/13/2013  . Polypharmacy 10/13/2013  . Depression 10/13/2013  . Atypical face pain 02/10/2013  . Esophageal reflux 06/12/2012  . Trigeminal neuralgia 11/07/2011  . Hx of migraine headaches 10/20/2011  . Dysphagia, unspecified(787.20) 09/26/2011  . Weight gain 09/26/2011  . NAUSEA 02/02/2010  . DIARRHEA 02/02/2010  . CHANGE IN BOWELS 02/02/2010  . ULCER-DUODENAL 11/26/2008  . History of peptic ulcer 11/26/2008  . GASTRITIS 11/26/2008  . HYPOTHYROIDISM 10/03/2007  . CONSTIPATION, CHRONIC 10/03/2007  . IRRITABLE BOWEL SYNDROME 10/03/2007  .  COLITIS, HX OF 10/03/2007    Orientation RESPIRATION BLADDER Height & Weight     Self, Time, Situation, Place  Normal Continent Weight: 108 lb 7.5 oz (49.2 kg) Height:  4\' 11"  (149.9 cm)  BEHAVIORAL SYMPTOMS/MOOD NEUROLOGICAL BOWEL NUTRITION STATUS      Continent Diet  AMBULATORY STATUS COMMUNICATION OF NEEDS Skin   Extensive Assist(non weight bearing with a hinged brace unlocked for 6 weeks ) Verbally Surgical wounds                       Personal Care Assistance Level of Assistance  Bathing, Dressing, Feeding Bathing Assistance: Maximum assistance Feeding assistance: Limited assistance Dressing Assistance: Maximum assistance     Functional Limitations Info             SPECIAL CARE FACTORS FREQUENCY  PT (By licensed PT), OT (By licensed OT)     PT Frequency: five times a week OT Frequency: five times a week            Contractures Contractures Info: Not present    Additional Factors Info  Psychotropic(Trazodone 100 mg PO daily at bedtime, Xanax 0.5 mg PO BID PRN ) Code Status Info: full code Allergies Info: acetaminophen           Current Medications (11/16/2018):  This is the current hospital active medication list Current Facility-Administered Medications  Medication Dose Route Frequency Provider Last Rate Last Dose  . acetaminophen (TYLENOL) tablet 1,000 mg  1,000 mg Oral Q8H Ophelia Charter T, MD   1,000 mg at 11/14/18 1718  . ALPRAZolam Duanne Moron) tablet  0.5 mg  0.5 mg Oral BID PRN Ophelia Charter T, MD   0.5 mg at 11/15/18 2018  . ceFAZolin (ANCEF) IVPB 1 g/50 mL premix  1 g Intravenous Q8H Varkey, Dax T, MD      . HYDROmorphone (DILAUDID) 1 MG/ML injection           . HYDROmorphone (DILAUDID) 1 MG/ML injection           . HYDROmorphone (DILAUDID) injection 0.5-1 mg  0.5-1 mg Intravenous Q4H PRN Hiram Gash, MD   1 mg at 11/16/18 0539  . lactated ringers infusion   Intravenous Continuous Hiram Gash, MD 10 mL/hr at 11/16/18 [phone]    . lactated ringers  infusion   Intravenous Continuous Ophelia Charter T, MD      . levothyroxine (SYNTHROID) tablet 75 mcg  75 mcg Oral Q0600 Hiram Gash, MD   75 mcg at 11/16/18 0532  . menthol-cetylpyridinium (CEPACOL) lozenge 3 mg  1 lozenge Oral PRN Ophelia Charter T, MD       Or  . phenol (CHLORASEPTIC) mouth spray 1 spray  1 spray Mouth/Throat PRN Ophelia Charter T, MD      . meperidine (DEMEROL) 50 MG/ML injection           . metoCLOPramide (REGLAN) tablet 5-10 mg  5-10 mg Oral Q8H PRN Ophelia Charter T, MD       Or  . metoCLOPramide (REGLAN) injection 5-10 mg  5-10 mg Intravenous Q8H PRN Hiram Gash, MD   10 mg at 11/14/18 1718  . ondansetron (ZOFRAN) injection 4 mg  4 mg Intravenous Q6H PRN Hiram Gash, MD   4 mg at 11/14/18 2202  . oxyCODONE (Oxy IR/ROXICODONE) 5 MG immediate release tablet           . oxyCODONE (Oxy IR/ROXICODONE) immediate release tablet 5-10 mg  5-10 mg Oral Q4H PRN Hiram Gash, MD   10 mg at 11/15/18 2254  . potassium chloride SA (K-DUR) CR tablet 40 mEq  40 mEq Oral Once [place] Basil, Dax T, MD      . potassium chloride SA (K-DUR) CR tablet 40 mEq  40 mEq Oral Once Ophelia Charter T, MD      . Derrill Memo ON 11/17/2018] rivaroxaban (XARELTO) tablet 10 mg  10 mg Oral Daily Ophelia Charter T, MD      . senna-docusate (Senokot-S) tablet 2 tablet  2 tablet Oral BID Ophelia Charter T, MD      . traZODone (DESYREL) tablet 100 mg  100 mg Oral QHS Ophelia Charter T, MD   100 mg at 11/15/18 2255     Discharge Medications: Please see discharge summary for a list of discharge medications.  Relevant Imaging Results:  Relevant Lab Results:   Additional Information [place], [place]

## 2018-11-16 NOTE — Progress Notes (Signed)
Orthopedic Tech Progress Note Patient Details:  Caitlin Irwin [birth year] [id number]  Patient ID: Caitlin Irwin, female   DOB: 03-01-1951, 68 y.o.   MRN: [id number] Called order into bio tech  Karolee Stamps 11/16/2018, 3:11 PM

## 2018-11-16 NOTE — Anesthesia Preprocedure Evaluation (Signed)
Anesthesia Evaluation  Patient identified by MRN, date of birth, ID band Patient awake    Reviewed: Allergy & Precautions, NPO status , Patient's Chart, lab work & pertinent test results  History of Anesthesia Complications (+) PONV and history of anesthetic complications  Airway Mallampati: II  TM Distance: >3 FB     Dental  (+) Dental Advisory Given   Pulmonary neg pulmonary ROS,    breath sounds clear to auscultation       Cardiovascular negative cardio ROS   Rhythm:Regular Rate:Normal     Neuro/Psych  Headaches, PSYCHIATRIC DISORDERS Anxiety Depression  Neuromuscular disease    GI/Hepatic Neg liver ROS, hiatal hernia, PUD, GERD  ,  Endo/Other  Hypothyroidism   Renal/GU negative Renal ROS     Musculoskeletal  (+) Arthritis ,   Abdominal   Peds  Hematology negative hematology ROS (+) anemia ,   Anesthesia Other Findings   Reproductive/Obstetrics                             Lab Results  Component Value Date   WBC 8.0 11/16/2018   HGB 10.5 (L) 11/16/2018   HCT 33.0 (L) 11/16/2018   MCV 90.2 11/16/2018   PLT 303 11/16/2018   Lab Results  Component Value Date   CREATININE 0.67 11/16/2018   BUN 9 11/16/2018   NA 137 11/16/2018   K 3.2 (L) 11/16/2018   CL 99 11/16/2018   CO2 32 11/16/2018    Anesthesia Physical  Anesthesia Plan  ASA: II  Anesthesia Plan: General   Post-op Pain Management:    Induction: Intravenous and Rapid sequence  PONV Risk Score and Plan: 3 and Treatment may vary due to age or medical condition, Ondansetron, Dexamethasone and Midazolam  Airway Management Planned: Oral ETT  Additional Equipment: None  Intra-op Plan:   Post-operative Plan: Extubation in OR  Informed Consent: I have reviewed the patients History and Physical, chart, labs and discussed the procedure including the risks, benefits and alternatives for the proposed anesthesia with  the patient or authorized representative who has indicated his/her understanding and acceptance.     Dental advisory given  Plan Discussed with: CRNA  Anesthesia Plan Comments:         Anesthesia Quick Evaluation

## 2018-11-17 LAB — CBC
HCT: 30.8 % — ABNORMAL LOW (ref 36.0–46.0)
Hemoglobin: 10 g/dL — ABNORMAL LOW (ref 12.0–15.0)
MCH: 29.1 pg (ref 26.0–34.0)
MCHC: 32.5 g/dL (ref 30.0–36.0)
MCV: 89.5 fL (ref 80.0–100.0)
Platelets: 321 10*3/uL (ref 150–400)
RBC: 3.44 MIL/uL — ABNORMAL LOW (ref 3.87–5.11)
RDW: 13.2 % (ref 11.5–15.5)
WBC: 9.4 10*3/uL (ref 4.0–10.5)
nRBC: 0 % (ref 0.0–0.2)

## 2018-11-17 MED ORDER — TRAMADOL HCL 50 MG PO TABS
50.0000 mg | ORAL_TABLET | Freq: Three times a day (TID) | ORAL | Status: DC
  Administered 2018-11-17 – 2018-11-18 (×3): 100 mg via ORAL
  Filled 2018-11-17 (×3): qty 2

## 2018-11-17 MED ORDER — ONDANSETRON HCL 4 MG/2ML IJ SOLN
4.0000 mg | Freq: Once | INTRAMUSCULAR | Status: AC
  Administered 2018-11-17: 08:00:00 4 mg via INTRAVENOUS
  Filled 2018-11-17: qty 2

## 2018-11-17 NOTE — Progress Notes (Signed)
Patient Demographics:    Caitlin Irwin, is a 68 y.o. female, DOB - 03-05-1951, ZJI:[id number]  Admit date - 11/13/2018   Admitting Physician Etta Quill, DO  Outpatient Primary MD for the patient is Levin Erp, MD  LOS - 4  Chief Complaint  Patient presents with   Fall        Subjective:    Caitlin Irwin today has no fevers,    No chest pain,  C/n nausea, no emesis  Assessment  & Plan :    Principal Problem:   Closed fracture of proximal end of right tibia and fibula Active Problems:   Hx of migraine headaches   Depression   History of hypothyroidism  Brief summary 68 year old female with past medical history of anxiety and depression plus chronic migraines who admitted on 11/13/2018 after mechanical fall while walking her dog leading to right tib-fib fracture, s/p Ext Fixation on 11/14/18, and ORIF on 11/16/18   A/p 1)Status post mechanical fall with closed fracture of proximal end of right tibia and fibula:--  S/p external fixation on 11/14/18,  S/p  definitive fixation  11/16/18 ,  patient will be nonweightbearing with a hinged brace unlocked for the first 6 weeks.   .  DVT prophylaxis Xarelto 10 mg a day starting 11/17/18.  Pain control with PRN pain medication preferring oral medicines.  Follow up with ortho  will be scheduled in approximately 7 days for incision check and XRay.  2)Acute Blood Loss Anemia in a post-op pt--- Hgb down to 10.0  from 12.4... Suspect due to  blood loss associated with initial fracture and  postop blood loss.... Monitor closely and transfuse as clinically indicated  3)  Depression/Anxiety: Stable,  c/n trazodone nightly Xanax 0.5 mg po bid prn  4)  History of hypothyroidism: Continue Synthroid, once p.o.  5) Hx of migraine headaches--- Imitrex as needed  Disposition/Need for in-Hospital Stay- patient unable to be discharged at this time due s/p  operative/definitive fixation of right tib-fib fracture on 11/16/2018--awaiting SNF rehab  Code Status : Full Code  Family Communication:   na  Disposition Plan  :  SNF rehab  Consultants:  Orthopedics  Procedures:  External fixation of right knee done 4/15  Plan definitive fixation 4/17  Antimicrobials:  Preop Ancef  DVT prophylaxis: Xarelto 10 mg daily   Lab Results  Component Value Date   PLT 321 11/17/2018    Inpatient Medications  Scheduled Meds:  acetaminophen  1,000 mg Oral Q8H   levothyroxine  75 mcg Oral Q0600   ondansetron (ZOFRAN) IV  4 mg Intravenous Once   potassium chloride  40 mEq Oral Once   potassium chloride  40 mEq Oral Once   rivaroxaban  10 mg Oral Daily   senna-docusate  2 tablet Oral BID   traZODone  100 mg Oral QHS   Continuous Infusions:  lactated ringers 10 mL/hr at 11/16/18 0854   lactated ringers     PRN Meds:.ALPRAZolam, HYDROmorphone (DILAUDID) injection, menthol-cetylpyridinium **OR** phenol, metoCLOPramide **OR** metoCLOPramide (REGLAN) injection, ondansetron (ZOFRAN) IV, oxyCODONE    Anti-infectives (From admission, onward)   Start     Dose/Rate Route Frequency Ordered Stop   11/16/18 1800  ceFAZolin (ANCEF) IVPB 1 g/50 mL premix  1 g 100 mL/hr over 30 Minutes Intravenous Every 8 hours 11/16/18 1505 11/17/18 0301   11/16/18 1202  vancomycin (VANCOCIN) powder  Status:  Discontinued       As needed 11/16/18 1202 11/16/18 1226   11/16/18 1201  tobramycin (NEBCIN) powder  Status:  Discontinued       As needed 11/16/18 1201 11/16/18 1226   11/16/18 0600  ceFAZolin (ANCEF) IVPB 2g/100 mL premix     2 g 200 mL/hr over 30 Minutes Intravenous To Short Stay 11/15/18 1727 11/16/18 1055   11/14/18 1830  ceFAZolin (ANCEF) IVPB 2g/100 mL premix     2 g 200 mL/hr over 30 Minutes Intravenous Every 6 hours 11/14/18 1618 11/15/18 0028   11/14/18 1130  ceFAZolin (ANCEF) IVPB 2g/100 mL premix     2 g 200 mL/hr over 30  Minutes Intravenous On call to O.R. 11/14/18 1120 11/14/18 1201        Objective:   Vitals:   11/16/18 1418 11/16/18 1810 11/16/18 1929 11/17/18 0407  BP: 127/66 132/73 132/72 (!) 117/57  Pulse: (!) 106 89 83 78  Resp: 16 16 18 16   Temp: 98.3 F (36.8 C) 98 F (36.7 C) 98.5 F (36.9 C) 99 F (37.2 C)  TempSrc: Oral Oral Oral Oral  SpO2: 96% 97% 94% 93%  Weight:      Height:        Wt Readings from Last 3 Encounters:  11/16/18 49.2 kg  04/20/17 50.8 kg  03/30/17 45.4 kg    Intake/Output Summary (Last 24 hours) at 11/17/2018 2951 Last data filed at 11/17/2018 0539 Gross per 24 hour  Intake 1276 ml  Output 425 ml  Net 851 ml   Physical Exam Patient is examined daily including today on 11/17/18 , exams remain the same as of yesterday except that has changed   Gen:- awake, alert HEENT:- [place].AT, No sclera icterus Neck-Supple Neck,No JVD,.  Lungs-  CTAB , fair symmetrical air movement CV- S1, S2 normal, regular  Abd-  +ve B.Sounds, Abd Soft, No tenderness,    Extremity/Skin:-   pedal pulses present, Right leg splinted/dressing, good cap refill Psych-affect is appropriate, oriented x3 Neuro-no new focal deficits, no tremors   Data Review:   Micro Results Recent Results (from the past 240 hour(s))  Surgical pcr screen     Status: None   Collection Time: 11/14/18 12:25 AM  Result Value Ref Range Status   MRSA, PCR NEGATIVE NEGATIVE Final   Staphylococcus aureus NEGATIVE NEGATIVE Final    Comment: (NOTE) The Xpert SA Assay (FDA approved for NASAL specimens in patients 65 years of age and older), is one component of a comprehensive surveillance program. It is not intended to diagnose infection nor to guide or monitor treatment. Performed at [facility], [place] [address], [place], [place] [address]     Radiology Reports Dg Chest 1 View  Result Date: 11/13/2018 CLINICAL DATA:  Pt c/o proximal and central right lower leg pain after falling down steps in her  home today. Hx of previous right knee surgery to repair ligaments. No chest complaints. No hx of heart or lung problems. Pt is a nonsmoker. Patient and tech wore masks. EXAM: CHEST  1 VIEW COMPARISON:  03/30/2017 FINDINGS: Numerous leads and wires project over the chest. Patient rotated left. Midline trachea. Mild cardiomegaly. Remote posterolateral left eighth rib fracture. No pleural effusion or pneumothorax. Clear lungs. IMPRESSION: No acute cardiopulmonary disease. Electronically Signed   By: Adria Devon.D.  On: 11/13/2018 21:13   Dg Knee 2 Views Right  Result Date: 11/13/2018 CLINICAL DATA:  Initial encounter for trauma and pain. EXAM: RIGHT KNEE - 1-2 VIEW COMPARISON:  None. FINDINGS: AP and lateral views. Comminuted proximal tibia and fibular fractures. The proximal tibial fracture may extend to the joint surface, but is suboptimally evaluated. Oblique component extends approximately 8 cm distal to the tibial plateau. No significant knee joint effusion. There is pretibial soft tissue swelling. Ligament attachment site about the anterior tibia may indicate prior ACL repair. IMPRESSION: Comminuted proximal tibia and fibular fractures. Consider CT to evaluate for intra-articular extension at the tibial plateau. Electronically Signed   By: Abigail Miyamoto M.D.   On: 11/13/2018 21:16   Dg Tibia/fibula Right  Result Date: 11/13/2018 CLINICAL DATA:  Recent fall with knee pain, initial encounter EXAM: RIGHT TIBIA AND FIBULA - 2 VIEW COMPARISON:  None. FINDINGS: Comminuted proximal fibular fracture is noted as well as comminuted proximal tibial fracture. Mild impaction at the fracture site within the proximal tibia is noted. Postsurgical changes are noted in the proximal tibia as well. Midshaft comminuted fibular fracture is seen. No distal tibial or fibular fracture is noted IMPRESSION: Comminuted proximal and midshaft fibular fractures with comminuted proximal tibial fracture with impaction at the fracture  site. Electronically Signed   By: Inez Catalina M.D.   On: 11/13/2018 21:09   Dg Ankle Complete Right  Result Date: 11/14/2018 CLINICAL DATA:  Right ankle pain. Proximal tibia and fibula fractures. EXAM: RIGHT ANKLE - COMPLETE 3+ VIEW COMPARISON:  Radiographs dated 11/13/2018 FINDINGS: There is no evidence of ankle fracture, dislocation, or joint effusion. There is an incompletely visualized fracture of the midshaft right fibula. There is no evidence of arthropathy or other focal bone abnormality. Soft tissues are unremarkable. IMPRESSION: Normal right ankle. Right mid fibula fracture as described on prior exams. Electronically Signed   By: Lorriane Shire M.D.   On: 11/14/2018 10:16   Ct Cervical Spine Wo Contrast  Result Date: 11/13/2018 CLINICAL DATA:  Fall.  Pain. EXAM: CT CERVICAL SPINE WITHOUT CONTRAST TECHNIQUE: Multidetector CT imaging of the cervical spine was performed without intravenous contrast. Multiplanar CT image reconstructions were also generated. COMPARISON:  02/20/2018 FINDINGS: Alignment: Spinal visualization through the bottom of T2. Maintenance of vertebral body height and alignment across these levels. Skull base and vertebrae: Left sided craniotomy with similar hyperattenuating left cerebellar rounded structure which could be postoperative. No acute fracture within the cervical spine identified. There is facet arthropathy bilaterally. C1-2 articulation within normal limits on coronal reformats Soft tissues and spinal canal: Prevertebral soft tissues are unremarkable. Disc levels: Loss of intervertebral disc height at multiple levels, most significant at C6-7. Upper chest: No apical pneumothorax. Other: None. IMPRESSION: Cervical spondylosis, without acute osseous abnormality. Electronically Signed   By: Abigail Miyamoto M.D.   On: 11/13/2018 20:59   Ct Knee Right Wo Contrast  Result Date: 11/13/2018 CLINICAL DATA:  Proximal tibial and fibular fractures, evaluate for possible articular  involvement EXAM: CT OF THE RIGHT KNEE WITHOUT CONTRAST TECHNIQUE: Multidetector CT imaging of the right knee was performed according to the standard protocol. Multiplanar CT image reconstructions were also generated. COMPARISON:  None. FINDINGS: Bones/Joint/Cartilage Comminuted fracture of the proximal fibula is noted with mild impaction at the fracture site. Additionally significantly comminuted and impacted fracture of the proximal tibial metaphysis is noted. The fracture fragments extend into the proximal diaphysis as well. There is impaction of the posterolateral aspect of the tibia superiorly into  the region of the tibial plateau although no definitive articular involvement is seen. Changes of prior ligament repair are noted with fixation staple in the anterior aspect of the tibia. Subchondral sclerosis is noted in the lateral tibial plateau as well as some mild degenerative changes of the knee joint. Distal femur is within normal limits. Midshaft fibular fracture is not evaluated on this exam. Ligaments Suboptimally assessed by CT.  Cruciate ligaments appear intact. Muscles and Tendons No specific muscular abnormality is noted. No significant muscular hematoma is seen. Soft tissues Changes are noted in the subcutaneous tissues consistent with edema. No significant joint effusion is noted. IMPRESSION: Comminuted proximal tibial and fibular fractures with impaction worse in the proximal tibia with extension of posterior fragments to just below the articular surface within posterior aspect of the tibial plateaus. No definitive intra-articular involvement is seen. Electronically Signed   By: Inez Catalina M.D.   On: 11/13/2018 22:27   Dg Knee Complete 4 Views Right  Result Date: 11/16/2018 CLINICAL DATA:  Open reduction and internal fixation of right tibial plateau fracture. EXAM: RIGHT KNEE - COMPLETE 4+ VIEW; DG C-ARM 61-120 MIN Radiation exposure index: 5.59 mGy. COMPARISON:  Radiograph of November 14, 2018.  FINDINGS: Five intraoperative fluoroscopic images were obtained of the right knee. These demonstrate surgical internal fixation of right tibial plateau fracture. Good alignment of fracture components is noted. IMPRESSION: Status post surgical internal fixation of right tibial plateau fracture. Electronically Signed   By: Marijo Conception M.D.   On: 11/16/2018 12:18   Dg Knee Complete 4 Views Right  Result Date: 11/14/2018 CLINICAL DATA:  Status post external fixation of right knee. EXAM: DG C-ARM 61-120 MIN; RIGHT KNEE - COMPLETE 4+ VIEW FLUOROSCOPY TIME:  17 seconds. COMPARISON:  Radiographs of November 13, 2018. FINDINGS: Four intraoperative fluoroscopic images of the right lower extremity demonstrate external fixation of mildly displaced and probably comminuted proximal tibial fracture. Fixation screws are seen in the right femur and right tibia. IMPRESSION: Status post external fixation of mildly displaced and probably comminuted proximal right tibial fracture. Electronically Signed   By: Marijo Conception M.D.   On: 11/14/2018 14:27   Dg Knee Right Port  Result Date: 11/16/2018 CLINICAL DATA:  Postop ORIF. EXAM: PORTABLE RIGHT KNEE - 1-2 VIEW COMPARISON:  11/16/2018 FINDINGS: Two-view show ORIF for tibial plateau fracture with lateral plate and screws as well as a tibial plateau staple. Proximal fibular fracture as shown previously. IMPRESSION: No complication seen following ORIF for comminuted proximal tibial fracture Electronically Signed   By: Nelson Chimes M.D.   On: 11/16/2018 13:43   Dg Knee Right Port  Result Date: 11/14/2018 CLINICAL DATA:  Postoperative check. EXAM: PORTABLE RIGHT KNEE - 1-2 VIEW COMPARISON:  11/14/2018 FINDINGS: Interval placement of an external fixator device with a fixation screw in the mid/distal femur and screws in the mid tibia. Again noted is the comminuted displaced fracture involving the proximal tibia and displaced fracture involving the proximal fibula. Again noted is  surgical hardware in the anterior proximal tibia. IMPRESSION: Interval placement of an external fixator device in right lower extremity. Electronically Signed   By: Markus Daft M.D.   On: 11/14/2018 15:47   Dg C-arm 1-60 Min  Result Date: 11/16/2018 CLINICAL DATA:  Open reduction and internal fixation of right tibial plateau fracture. EXAM: RIGHT KNEE - COMPLETE 4+ VIEW; DG C-ARM 61-120 MIN Radiation exposure index: 5.59 mGy. COMPARISON:  Radiograph of November 14, 2018. FINDINGS: Five intraoperative fluoroscopic images  were obtained of the right knee. These demonstrate surgical internal fixation of right tibial plateau fracture. Good alignment of fracture components is noted. IMPRESSION: Status post surgical internal fixation of right tibial plateau fracture. Electronically Signed   By: Marijo Conception M.D.   On: 11/16/2018 12:18   Dg C-arm 1-60 Min  Result Date: 11/14/2018 CLINICAL DATA:  Status post external fixation of right knee. EXAM: DG C-ARM 61-120 MIN; RIGHT KNEE - COMPLETE 4+ VIEW FLUOROSCOPY TIME:  17 seconds. COMPARISON:  Radiographs of November 13, 2018. FINDINGS: Four intraoperative fluoroscopic images of the right lower extremity demonstrate external fixation of mildly displaced and probably comminuted proximal tibial fracture. Fixation screws are seen in the right femur and right tibia. IMPRESSION: Status post external fixation of mildly displaced and probably comminuted proximal right tibial fracture. Electronically Signed   By: Marijo Conception M.D.   On: 11/14/2018 14:27     CBC Recent Labs  Lab 11/13/18 2019 11/15/18 0127 11/16/18 0129 11/17/18 0227  WBC 14.8* 7.6 8.0 9.4  HGB 12.4 10.1* 10.5* 10.0*  HCT 38.1 30.7* 33.0* 30.8*  PLT 294 298 303 321  MCV 90.5 89.8 90.2 89.5  MCH 29.5 29.5 28.7 29.1  MCHC 32.5 32.9 31.8 32.5  RDW 13.2 13.2 13.2 13.2  LYMPHSABS 1.5  --   --   --   MONOABS 0.8  --   --   --   EOSABS 0.1  --   --   --   BASOSABS 0.0  --   --   --     Chemistries   Recent Labs  Lab 11/13/18 2019 11/15/18 0127 11/16/18 0129  NA 137 135 137  K 4.0 3.9 3.2*  CL 106 100 99  CO2 22 25 32  GLUCOSE 116* 122* 102*  BUN 12 10 9   CREATININE 0.85 0.71 0.67  CALCIUM 9.2 8.9 9.5   ------------------------------------------------------------------------------------------------------------------ No results for input(s): CHOL, HDL, LDLCALC, TRIG, CHOLHDL, LDLDIRECT in the last 72 hours.  Lab Results  Component Value Date   HGBA1C (H) 11/25/2009    5.7 (NOTE)                                                                       According to the ADA Clinical Practice Recommendations for 2011, when HbA1c is used as a screening test:   >=6.5%   Diagnostic of Diabetes Mellitus           (if abnormal result  is confirmed)  5.7-6.4%   Increased risk of developing Diabetes Mellitus  References:Diagnosis and Classification of Diabetes Mellitus,Diabetes Care,2011,34(Suppl 1):S62-S69 and Standards of Medical Care in         Diabetes - 2011,Diabetes [facility],7654,65  (Suppl 1):S11-S61.   ------------------------------------------------------------------------------------------------------------------ No results for input(s): TSH, T4TOTAL, T3FREE, THYROIDAB in the last 72 hours.  Invalid input(s): FREET3 ------------------------------------------------------------------------------------------------------------------ No results for input(s): VITAMINB12, FOLATE, FERRITIN, TIBC, IRON, RETICCTPCT in the last 72 hours.  Coagulation profile No results for input(s): INR, PROTIME in the last 168 hours.  No results for input(s): DDIMER in the last 72 hours.  Cardiac Enzymes No results for input(s): CKMB, TROPONINI, MYOGLOBIN in the last 168 hours.  Invalid input(s): CK ------------------------------------------------------------------------------------------------------------------ No results found for: BNP   Caitlin Irwin  Caitlin Irwin M.D on 11/17/2018 at 8:12 AM  Go to  [email or web address] - for contact info  [facility] - Office  [phone]
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
## 2018-11-17 NOTE — Progress Notes (Addendum)
Orthopedic Trauma Service Progress Note Weekend Coverage   Patient ID: KATHRYNE RAMELLA MRN: [id number] DOB/AGE: Jul 06, 1951 68 y.o.  Subjective:  Doing fair, moderate pain  Sitting in chair + nausea   Not taking scheduled tylenol as pt states she has nausea and vomiting with tylenol?  Thinks she will need snf Lives alone in 2 story house. All bedrooms on 2nd floor  Not sure if family will be able to provide enough support  No CP or SOB No H/A   Review of Systems  Constitutional: Negative for chills and fever.  Respiratory: Negative for shortness of breath.   Cardiovascular: Negative for chest pain and palpitations.  Gastrointestinal: Positive for nausea. Negative for vomiting.  Neurological: Negative for tingling and sensory change.     Objective:   VITALS:   Vitals:   11/16/18 1418 11/16/18 1810 11/16/18 1929 11/17/18 0407  BP: 127/66 132/73 132/72 (!) 117/57  Pulse: (!) 106 89 83 78  Resp: 16 16 18 16   Temp: 98.3 F (36.8 C) 98 F (36.7 C) 98.5 F (36.9 C) 99 F (37.2 C)  TempSrc: Oral Oral Oral Oral  SpO2: 96% 97% 94% 93%  Weight:      Height:        Estimated body mass index is 21.91 kg/m as calculated from the following:   Height as of this encounter: 4\' 11"  (1.499 m).   Weight as of this encounter: 49.2 kg.   Intake/Output      04/17 0701 - 04/18 0700 04/18 0701 - 04/19 0700   P.O. 60    I.V. (mL/kg) 1216 (24.7)    IV Piggyback 0    Total Intake(mL/kg) 1276 (25.9)    Urine (mL/kg/hr) 400 (0.3)    Blood 25    Total Output 425    Net +851           LABS  Results for orders placed or performed during the hospital encounter of 11/13/18 (from the past 24 hour(s))  CBC     Status: Abnormal   Collection Time: 11/17/18  2:27 AM  Result Value Ref Range   WBC 9.4 4.0 - 10.5 K/uL   RBC 3.44 (L) 3.87 - 5.11 MIL/uL   Hemoglobin 10.0 (L) 12.0 - 15.0 g/dL   HCT 30.8 (L) 36.0 -  46.0 %   MCV 89.5 80.0 - 100.0 fL   MCH 29.1 26.0 - 34.0 pg   MCHC 32.5 30.0 - 36.0 g/dL   RDW 13.2 11.5 - 15.5 %   Platelets 321 150 - 400 K/uL   nRBC 0.0 0.0 - 0.2 %     PHYSICAL EXAM:   Gen: sitting up in chair, appears tired  Lungs:CTA anterior fields Cardiac: s1 and s2, irregular at times  Abd: + BS, NTND Ext:       Right Lower Extremity   Dressing c/d/i  Hinged knee brace fitting well and is locked in full extension   R foot/ankle resting in equinovarus   Tolerates passive foot and ankle extension   Swelling controlled  No pain with passive stretching  EHL, FHL, lesser toe motor intact  Ankle flexion/extension/inversion/eversion grossly intact  + DP pulse  No DCT  Compartments are soft   Assessment/Plan: 1 Day Post-Op   Principal Problem:   Closed fracture of proximal end of  right tibia and fibula Active Problems:   Hx of migraine headaches   Depression   History of hypothyroidism   Anti-infectives (From admission, onward)   Start     Dose/Rate Route Frequency Ordered Stop   11/16/18 1800  ceFAZolin (ANCEF) IVPB 1 g/50 mL premix     1 g 100 mL/hr over 30 Minutes Intravenous Every 8 hours 11/16/18 1505 11/17/18 0301   11/16/18 1202  vancomycin (VANCOCIN) powder  Status:  Discontinued       As needed 11/16/18 1202 11/16/18 1226   11/16/18 1201  tobramycin (NEBCIN) powder  Status:  Discontinued       As needed 11/16/18 1201 11/16/18 1226   11/16/18 0600  ceFAZolin (ANCEF) IVPB 2g/100 mL premix     2 g 200 mL/hr over 30 Minutes Intravenous To Short Stay 11/15/18 1727 11/16/18 1055   11/14/18 1830  ceFAZolin (ANCEF) IVPB 2g/100 mL premix     2 g 200 mL/hr over 30 Minutes Intravenous Every 6 hours 11/14/18 1618 11/15/18 0028   11/14/18 1130  ceFAZolin (ANCEF) IVPB 2g/100 mL premix     2 g 200 mL/hr over 30 Minutes Intravenous On call to O.R. 11/14/18 1120 11/14/18 1201    .  POD/HD#: 1  69 y/o female s/p fall with comminuted R tibial plateau fracture   -comminuted R tibial plateau fracture s/p ORIF   TDWB R leg  Hinged knee brace is on but locked in full extension   Motion will start after first post op follow up in 1 week  Will remove ace wrap tomorrow to eval aquacel dressing   PT/OT evals   Ankle theraband and stretching program to prevent contracture   Ice and elevate    - Pain management:  Titrate accordingly   Dc scheduled tylenol as pt refusing to take and claims allergy to tylenol    Will add ultram to regimen   Pt may do better with this   - ABL anemia/Hemodynamics  Stable  Cbc in am   - Medical issues   Per primary team    Will check EKG   - DVT/PE prophylaxis:  xarelto   - ID:   periop abx   - Activity:  Up with therapies  TDWB R leg   - FEN/GI prophylaxis/Foley/Lines:  Diet as tolerated   - Dispo:  PT/OT  Will likely need SNF   Continue with inpatient care  Likely ready for snf Monday      Jari Pigg, PA-C [phone] (C) 11/17/2018, 11:34 AM  [facility] [place] [phone] Domingo Sep (F)
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
## 2018-11-18 LAB — CBC
HCT: 32.3 % — ABNORMAL LOW (ref 36.0–46.0)
Hemoglobin: 10.5 g/dL — ABNORMAL LOW (ref 12.0–15.0)
MCH: 29.7 pg (ref 26.0–34.0)
MCHC: 32.5 g/dL (ref 30.0–36.0)
MCV: 91.5 fL (ref 80.0–100.0)
Platelets: 406 10*3/uL — ABNORMAL HIGH (ref 150–400)
RBC: 3.53 MIL/uL — ABNORMAL LOW (ref 3.87–5.11)
RDW: 13.6 % (ref 11.5–15.5)
WBC: 12.4 10*3/uL — ABNORMAL HIGH (ref 4.0–10.5)
nRBC: 0 % (ref 0.0–0.2)

## 2018-11-18 LAB — BASIC METABOLIC PANEL
Anion gap: 11 (ref 5–15)
BUN: 13 mg/dL (ref 8–23)
CO2: 28 mmol/L (ref 22–32)
Calcium: 9.3 mg/dL (ref 8.9–10.3)
Chloride: 96 mmol/L — ABNORMAL LOW (ref 98–111)
Creatinine, Ser: 0.86 mg/dL (ref 0.44–1.00)
GFR calc Af Amer: >60 mL/min (ref >60)
GFR calc non Af Amer: >60 mL/min (ref >60)
Glucose, Bld: 103 mg/dL — ABNORMAL HIGH (ref 70–99)
Potassium: 3.7 mmol/L (ref 3.5–5.1)
Sodium: 135 mmol/L (ref 135–145)

## 2018-11-18 LAB — TSH: TSH: 0.732 u[IU]/mL (ref 0.350–4.500)

## 2018-11-18 MED ORDER — TRAMADOL HCL 50 MG PO TABS
50.0000 mg | ORAL_TABLET | Freq: Three times a day (TID) | ORAL | Status: DC | PRN
  Administered 2018-11-18: 50 mg via ORAL
  Administered 2018-11-19: 18:00:00 100 mg via ORAL
  Filled 2018-11-18: qty 2
  Filled 2018-11-18: qty 1

## 2018-11-18 NOTE — Progress Notes (Signed)
Patient Demographics:    Caitlin Irwin, is a 68 y.o. female, DOB - 08/24/1950, DJT:[id number]  Admit date - 11/13/2018   Admitting Physician Etta Quill, DO  Outpatient Primary MD for the patient is Levin Erp, MD  LOS - 5  Chief Complaint  Patient presents with   Fall        Subjective:    [name] today has no fevers,    No chest pain,  C/n nausea, no emesis, no cough, no shortness of breath  Assessment  & Plan :    Principal Problem:   Closed fracture of proximal end of right tibia and fibula Active Problems:   Hx of migraine headaches   Depression   History of hypothyroidism  Brief summary 68 year old female with past medical history of anxiety and depression plus chronic migraines who admitted on 11/13/2018 after mechanical fall while walking her dog leading to right tib-fib fracture, s/p Ext Fixation on 11/14/18, and ORIF on 11/16/18   A/p 1)Status post mechanical fall with closed fracture of proximal end of right tibia and fibula:--  S/p external fixation on 11/14/18,  S/p  definitive fixation  11/16/18 ,  patient will be nonweightbearing with a hinged brace unlocked for the first 6 weeks.   .  DVT prophylaxis Xarelto 10 mg a day starting 11/17/18.  Pain control with PRN pain medication preferring oral medicines.  Follow up with ortho  will be scheduled in approximately 7 days for incision check and XRay.  2)Acute Blood Loss Anemia in a post-op pt--- Hgb down to 10.0  from 12.4... Suspect due to  blood loss associated with initial fracture and  postop blood loss.... Monitor closely and transfuse as clinically indicated   3)  Depression/Anxiety: Stable,  c/n trazodone nightly Xanax 0.5 mg po bid prn  4)  History of hypothyroidism: Continue Synthroid   5) Hx of migraine headaches--- Imitrex as needed   6)Leukocytosis--- no fevers, patient does not look toxic, check  UA  Disposition/Need for in-Hospital Stay- patient unable to be discharged at this time due s/p operative/definitive fixation of right tib-fib fracture on 11/16/2018--awaiting SNF rehab  Code Status : Full Code  Family Communication:   na  Disposition Plan  :  SNF rehab  Consultants:  Orthopedics  Procedures:  External fixation of right knee done 4/15  S/p  Definitive ORIF 4/17  Antimicrobials:  Preop Ancef  DVT prophylaxis: Xarelto 10 mg daily   Lab Results  Component Value Date   PLT 406 (H) 11/18/2018    Inpatient Medications  Scheduled Meds:  acetaminophen  1,000 mg Oral Q8H   levothyroxine  75 mcg Oral Q0600   potassium chloride  40 mEq Oral Once   potassium chloride  40 mEq Oral Once   rivaroxaban  10 mg Oral Daily   senna-docusate  2 tablet Oral BID   traZODone  100 mg Oral QHS   Continuous Infusions:  lactated ringers 10 mL/hr at 11/16/18 0854   lactated ringers     PRN Meds:.ALPRAZolam, HYDROmorphone (DILAUDID) injection, menthol-cetylpyridinium **OR** phenol, metoCLOPramide **OR** metoCLOPramide (REGLAN) injection, ondansetron (ZOFRAN) IV, oxyCODONE, traMADol    Anti-infectives (From admission, onward)   Start     Dose/Rate Route Frequency Ordered Stop   11/16/18  1800  ceFAZolin (ANCEF) IVPB 1 g/50 mL premix     1 g 100 mL/hr over 30 Minutes Intravenous Every 8 hours 11/16/18 1505 11/17/18 0301   11/16/18 1202  vancomycin (VANCOCIN) powder  Status:  Discontinued       As needed 11/16/18 1202 11/16/18 1226   11/16/18 1201  tobramycin (NEBCIN) powder  Status:  Discontinued       As needed 11/16/18 1201 11/16/18 1226   11/16/18 0600  ceFAZolin (ANCEF) IVPB 2g/100 mL premix     2 g 200 mL/hr over 30 Minutes Intravenous To Short Stay 11/15/18 1727 11/16/18 1055   11/14/18 1830  ceFAZolin (ANCEF) IVPB 2g/100 mL premix     2 g 200 mL/hr over 30 Minutes Intravenous Every 6 hours 11/14/18 1618 11/15/18 0028   11/14/18 1130  ceFAZolin  (ANCEF) IVPB 2g/100 mL premix     2 g 200 mL/hr over 30 Minutes Intravenous On call to O.R. 11/14/18 1120 11/14/18 1201        Objective:   Vitals:   11/17/18 0407 11/17/18 1452 11/17/18 2058 11/18/18 0500  BP: (!) 117/57 120/75 123/74 132/60  Pulse: 78 90 93 70  Resp: 16 16 16 18   Temp: 99 F (37.2 C) 98.5 F (36.9 C) 98.9 F (37.2 C) 99.6 F (37.6 C)  TempSrc: Oral Oral Oral Oral  SpO2: 93% 95% 91% 91%  Weight:      Height:        Wt Readings from Last 3 Encounters:  11/16/18 49.2 kg  04/20/17 50.8 kg  03/30/17 45.4 kg    Intake/Output Summary (Last 24 hours) at 11/18/2018 1357 Last data filed at 11/18/2018 1033 Gross per 24 hour  Intake 320 ml  Output 300 ml  Net 20 ml   Physical Exam Patient is examined daily including today on 11/18/18 , exams remain the same as of yesterday except that has changed   Gen:- awake, alert HEENT:- [place].AT, No sclera icterus Neck-Supple Neck,No JVD,.  Lungs-  CTAB , fair symmetrical air movement CV- S1, S2 normal, regular  Abd-  +ve B.Sounds, Abd Soft, No tenderness,    Extremity/Skin:-   pedal pulses present, Right leg splinted/dressing, good cap refill Psych-affect is appropriate, oriented x3 Neuro-no new focal deficits, no tremors   Data Review:   Micro Results Recent Results (from the past 240 hour(s))  Surgical pcr screen     Status: None   Collection Time: 11/14/18 12:25 AM  Result Value Ref Range Status   MRSA, PCR NEGATIVE NEGATIVE Final   Staphylococcus aureus NEGATIVE NEGATIVE Final    Comment: (NOTE) The Xpert SA Assay (FDA approved for NASAL specimens in patients 77 years of age and older), is one component of a comprehensive surveillance program. It is not intended to diagnose infection nor to guide or monitor treatment. Performed at [facility], [place] [address], [place], [place] [address]     Radiology Reports Dg Chest 1 View  Result Date: 11/13/2018 CLINICAL DATA:  Pt c/o proximal and central  right lower leg pain after falling down steps in her home today. Hx of previous right knee surgery to repair ligaments. No chest complaints. No hx of heart or lung problems. Pt is a nonsmoker. Patient and tech wore masks. EXAM: CHEST  1 VIEW COMPARISON:  03/30/2017 FINDINGS: Numerous leads and wires project over the chest. Patient rotated left. Midline trachea. Mild cardiomegaly. Remote posterolateral left eighth rib fracture. No pleural effusion or pneumothorax. Clear lungs. IMPRESSION: No  acute cardiopulmonary disease. Electronically Signed   By: Abigail Miyamoto M.D.   On: 11/13/2018 21:13   Dg Knee 2 Views Right  Result Date: 11/13/2018 CLINICAL DATA:  Initial encounter for trauma and pain. EXAM: RIGHT KNEE - 1-2 VIEW COMPARISON:  None. FINDINGS: AP and lateral views. Comminuted proximal tibia and fibular fractures. The proximal tibial fracture may extend to the joint surface, but is suboptimally evaluated. Oblique component extends approximately 8 cm distal to the tibial plateau. No significant knee joint effusion. There is pretibial soft tissue swelling. Ligament attachment site about the anterior tibia may indicate prior ACL repair. IMPRESSION: Comminuted proximal tibia and fibular fractures. Consider CT to evaluate for intra-articular extension at the tibial plateau. Electronically Signed   By: Abigail Miyamoto M.D.   On: 11/13/2018 21:16   Dg Tibia/fibula Right  Result Date: 11/13/2018 CLINICAL DATA:  Recent fall with knee pain, initial encounter EXAM: RIGHT TIBIA AND FIBULA - 2 VIEW COMPARISON:  None. FINDINGS: Comminuted proximal fibular fracture is noted as well as comminuted proximal tibial fracture. Mild impaction at the fracture site within the proximal tibia is noted. Postsurgical changes are noted in the proximal tibia as well. Midshaft comminuted fibular fracture is seen. No distal tibial or fibular fracture is noted IMPRESSION: Comminuted proximal and midshaft fibular fractures with comminuted  proximal tibial fracture with impaction at the fracture site. Electronically Signed   By: Inez Catalina M.D.   On: 11/13/2018 21:09   Dg Ankle Complete Right  Result Date: 11/14/2018 CLINICAL DATA:  Right ankle pain. Proximal tibia and fibula fractures. EXAM: RIGHT ANKLE - COMPLETE 3+ VIEW COMPARISON:  Radiographs dated 11/13/2018 FINDINGS: There is no evidence of ankle fracture, dislocation, or joint effusion. There is an incompletely visualized fracture of the midshaft right fibula. There is no evidence of arthropathy or other focal bone abnormality. Soft tissues are unremarkable. IMPRESSION: Normal right ankle. Right mid fibula fracture as described on prior exams. Electronically Signed   By: Lorriane Shire M.D.   On: 11/14/2018 10:16   Ct Cervical Spine Wo Contrast  Result Date: 11/13/2018 CLINICAL DATA:  Fall.  Pain. EXAM: CT CERVICAL SPINE WITHOUT CONTRAST TECHNIQUE: Multidetector CT imaging of the cervical spine was performed without intravenous contrast. Multiplanar CT image reconstructions were also generated. COMPARISON:  02/20/2018 FINDINGS: Alignment: Spinal visualization through the bottom of T2. Maintenance of vertebral body height and alignment across these levels. Skull base and vertebrae: Left sided craniotomy with similar hyperattenuating left cerebellar rounded structure which could be postoperative. No acute fracture within the cervical spine identified. There is facet arthropathy bilaterally. C1-2 articulation within normal limits on coronal reformats Soft tissues and spinal canal: Prevertebral soft tissues are unremarkable. Disc levels: Loss of intervertebral disc height at multiple levels, most significant at C6-7. Upper chest: No apical pneumothorax. Other: None. IMPRESSION: Cervical spondylosis, without acute osseous abnormality. Electronically Signed   By: Abigail Miyamoto M.D.   On: 11/13/2018 20:59   Ct Knee Right Wo Contrast  Result Date: 11/13/2018 CLINICAL DATA:  Proximal tibial  and fibular fractures, evaluate for possible articular involvement EXAM: CT OF THE RIGHT KNEE WITHOUT CONTRAST TECHNIQUE: Multidetector CT imaging of the right knee was performed according to the standard protocol. Multiplanar CT image reconstructions were also generated. COMPARISON:  None. FINDINGS: Bones/Joint/Cartilage Comminuted fracture of the proximal fibula is noted with mild impaction at the fracture site. Additionally significantly comminuted and impacted fracture of the proximal tibial metaphysis is noted. The fracture fragments extend into the proximal diaphysis  as well. There is impaction of the posterolateral aspect of the tibia superiorly into the region of the tibial plateau although no definitive articular involvement is seen. Changes of prior ligament repair are noted with fixation staple in the anterior aspect of the tibia. Subchondral sclerosis is noted in the lateral tibial plateau as well as some mild degenerative changes of the knee joint. Distal femur is within normal limits. Midshaft fibular fracture is not evaluated on this exam. Ligaments Suboptimally assessed by CT.  Cruciate ligaments appear intact. Muscles and Tendons No specific muscular abnormality is noted. No significant muscular hematoma is seen. Soft tissues Changes are noted in the subcutaneous tissues consistent with edema. No significant joint effusion is noted. IMPRESSION: Comminuted proximal tibial and fibular fractures with impaction worse in the proximal tibia with extension of posterior fragments to just below the articular surface within posterior aspect of the tibial plateaus. No definitive intra-articular involvement is seen. Electronically Signed   By: Inez Catalina M.D.   On: 11/13/2018 22:27   Dg Knee Complete 4 Views Right  Result Date: 11/16/2018 CLINICAL DATA:  Open reduction and internal fixation of right tibial plateau fracture. EXAM: RIGHT KNEE - COMPLETE 4+ VIEW; DG C-ARM 61-120 MIN Radiation exposure index:  5.59 mGy. COMPARISON:  Radiograph of November 14, 2018. FINDINGS: Five intraoperative fluoroscopic images were obtained of the right knee. These demonstrate surgical internal fixation of right tibial plateau fracture. Good alignment of fracture components is noted. IMPRESSION: Status post surgical internal fixation of right tibial plateau fracture. Electronically Signed   By: Marijo Conception M.D.   On: 11/16/2018 12:18   Dg Knee Complete 4 Views Right  Result Date: 11/14/2018 CLINICAL DATA:  Status post external fixation of right knee. EXAM: DG C-ARM 61-120 MIN; RIGHT KNEE - COMPLETE 4+ VIEW FLUOROSCOPY TIME:  17 seconds. COMPARISON:  Radiographs of November 13, 2018. FINDINGS: Four intraoperative fluoroscopic images of the right lower extremity demonstrate external fixation of mildly displaced and probably comminuted proximal tibial fracture. Fixation screws are seen in the right femur and right tibia. IMPRESSION: Status post external fixation of mildly displaced and probably comminuted proximal right tibial fracture. Electronically Signed   By: Marijo Conception M.D.   On: 11/14/2018 14:27   Dg Knee Right Port  Result Date: 11/16/2018 CLINICAL DATA:  Postop ORIF. EXAM: PORTABLE RIGHT KNEE - 1-2 VIEW COMPARISON:  11/16/2018 FINDINGS: Two-view show ORIF for tibial plateau fracture with lateral plate and screws as well as a tibial plateau staple. Proximal fibular fracture as shown previously. IMPRESSION: No complication seen following ORIF for comminuted proximal tibial fracture Electronically Signed   By: Nelson Chimes M.D.   On: 11/16/2018 13:43   Dg Knee Right Port  Result Date: 11/14/2018 CLINICAL DATA:  Postoperative check. EXAM: PORTABLE RIGHT KNEE - 1-2 VIEW COMPARISON:  11/14/2018 FINDINGS: Interval placement of an external fixator device with a fixation screw in the mid/distal femur and screws in the mid tibia. Again noted is the comminuted displaced fracture involving the proximal tibia and displaced  fracture involving the proximal fibula. Again noted is surgical hardware in the anterior proximal tibia. IMPRESSION: Interval placement of an external fixator device in right lower extremity. Electronically Signed   By: Markus Daft M.D.   On: 11/14/2018 15:47   Dg C-arm 1-60 Min  Result Date: 11/16/2018 CLINICAL DATA:  Open reduction and internal fixation of right tibial plateau fracture. EXAM: RIGHT KNEE - COMPLETE 4+ VIEW; DG C-ARM 61-120 MIN Radiation exposure index:  5.59 mGy. COMPARISON:  Radiograph of November 14, 2018. FINDINGS: Five intraoperative fluoroscopic images were obtained of the right knee. These demonstrate surgical internal fixation of right tibial plateau fracture. Good alignment of fracture components is noted. IMPRESSION: Status post surgical internal fixation of right tibial plateau fracture. Electronically Signed   By: Marijo Conception M.D.   On: 11/16/2018 12:18   Dg C-arm 1-60 Min  Result Date: 11/14/2018 CLINICAL DATA:  Status post external fixation of right knee. EXAM: DG C-ARM 61-120 MIN; RIGHT KNEE - COMPLETE 4+ VIEW FLUOROSCOPY TIME:  17 seconds. COMPARISON:  Radiographs of November 13, 2018. FINDINGS: Four intraoperative fluoroscopic images of the right lower extremity demonstrate external fixation of mildly displaced and probably comminuted proximal tibial fracture. Fixation screws are seen in the right femur and right tibia. IMPRESSION: Status post external fixation of mildly displaced and probably comminuted proximal right tibial fracture. Electronically Signed   By: Marijo Conception M.D.   On: 11/14/2018 14:27     CBC Recent Labs  Lab 11/13/18 2019 11/15/18 0127 11/16/18 0129 11/17/18 0227 11/18/18 0243  WBC 14.8* 7.6 8.0 9.4 12.4*  HGB 12.4 10.1* 10.5* 10.0* 10.5*  HCT 38.1 30.7* 33.0* 30.8* 32.3*  PLT 294 298 303 321 406*  MCV 90.5 89.8 90.2 89.5 91.5  MCH 29.5 29.5 28.7 29.1 29.7  MCHC 32.5 32.9 31.8 32.5 32.5  RDW 13.2 13.2 13.2 13.2 13.6  LYMPHSABS 1.5  --    --   --   --   MONOABS 0.8  --   --   --   --   EOSABS 0.1  --   --   --   --   BASOSABS 0.0  --   --   --   --     Chemistries  Recent Labs  Lab 11/13/18 2019 11/15/18 0127 11/16/18 0129 11/18/18 0243  NA 137 135 137 135  K 4.0 3.9 3.2* 3.7  CL 106 100 99 96*  CO2 22 25 32 28  GLUCOSE 116* 122* 102* 103*  BUN 12 10 9 13   CREATININE 0.85 0.71 0.67 0.86  CALCIUM 9.2 8.9 9.5 9.3   ------------------------------------------------------------------------------------------------------------------ No results for input(s): CHOL, HDL, LDLCALC, TRIG, CHOLHDL, LDLDIRECT in the last 72 hours.  Lab Results  Component Value Date   HGBA1C (H) 11/25/2009    5.7 (NOTE)                                                                       According to the ADA Clinical Practice Recommendations for 2011, when HbA1c is used as a screening test:   >=6.5%   Diagnostic of Diabetes Mellitus           (if abnormal result  is confirmed)  5.7-6.4%   Increased risk of developing Diabetes Mellitus  References:Diagnosis and Classification of Diabetes Mellitus,Diabetes NOMV,6720,94(BSJGG 1):S62-S69 and Standards of Medical Care in         Diabetes - 2011,Diabetes EZMO,2947,65  (Suppl 1):S11-S61.   ------------------------------------------------------------------------------------------------------------------ Recent Labs    11/18/18 1216  TSH 0.732   ------------------------------------------------------------------------------------------------------------------ No results for input(s): VITAMINB12, FOLATE, FERRITIN, TIBC, IRON, RETICCTPCT in the last 72 hours.  Coagulation profile No results for input(s): INR, PROTIME in the last  168 hours.  No results for input(s): DDIMER in the last 72 hours.  Cardiac Enzymes No results for input(s): CKMB, TROPONINI, MYOGLOBIN in the last 168 hours.  Invalid input(s):  CK ------------------------------------------------------------------------------------------------------------------ No results found for: BNP   Roxan Hockey M.D on 11/18/2018 at 1:57 PM  Go to [email or web address] - for contact info  [facility] - Office  [phone]

## 2018-11-18 NOTE — Discharge Instructions (Addendum)
Ophelia Charter MD, MPH [place] [address], [address] [phone] (tel)   [phone] (fax)   POST-OPERATIVE INSTRUCTIONS  WOUND CARE You have an Aquacell dressing on the lateral part of your knee.  This large Band-Aid appearing item can be left in place for 7 days.  The open pinholes from your external fixator have gauze over them.  This can be changed starting on postop day 3.  It is okay to shower immediately after your first dressing change however do not soak your incisions under water.  The aqua cell dressing is water resistant.   BRACE/AMBULATION Your leg will be placed in a brace post-operatively. You will need to wear your brace at all times for first 2 weeks. It should be locked in full extension (0 degrees) until your first post-op visit 7 days after surgery  You must not put weight through your operative extremity, touchdown weightbearing as appropriate only.  POST-OP PAIN CONTROL A multi-modal approach will be used to treat your pain. You will be given one narcotic prescription for pain relief, one non-narcotic prescription and one anti-inflammatory to use post-operatively:  Oxycodone - This is a strong narcotic, to be used only on an as needed basis for pain. Celebrex- a non narctoic anti-inflammatory and pain reliever.  Do not take additional ibuprofen, naproxen or other NSAID while taking this medicine.  Acetaminophen - A non-narcotic pain medicine.  Use 1000mg  three times a day for the first 14 days after surgery Xarelto- This medicine is used to minimize the already low risk of blood clots after surgery If you have any adverse effects with the medications, please call our office. If you develop a Fever (>101.5), Redness or Drainage from the surgical incision site, please call our office to arrange for an evaluation.  FOLLOW-UP Please call the office to schedule a follow-up appointment for your suture removal, 10-14 days post-operatively. IF  YOU HAVE ANY QUESTIONS, PLEASE FEEL FREE TO CALL OUR OFFICE.     Information on my medicine - XARELTO (Rivaroxaban)  Why was Xarelto prescribed for you? Xarelto was prescribed for you to reduce the risk of blood clots forming after orthopedic surgery. The medical term for these abnormal blood clots is venous thromboembolism (VTE).  What do you need to know about xarelto ? Take your Xarelto ONCE DAILY at the same time every day. You may take it either with or without food.  If you have difficulty swallowing the tablet whole, you may crush it and mix in applesauce just prior to taking your dose.  Take Xarelto exactly as prescribed by your doctor and DO NOT stop taking Xarelto without talking to the doctor who prescribed the medication.  Stopping without other VTE prevention medication to take the place of Xarelto may increase your risk of developing a clot.  After discharge, you should have regular check-up appointments with your healthcare provider that is prescribing your Xarelto.    What do you do if you miss a dose? If you miss a dose, take it as soon as you remember on the same day then continue your regularly scheduled once daily regimen the next day. Do not take two doses of Xarelto on the same day.   Important Safety Information A possible side effect of Xarelto is bleeding. You should call your healthcare provider right away if you experience any of the following: ? Bleeding from an injury or your nose that does not stop. ? Unusual colored urine (red or dark brown) or  unusual colored stools (red or black). ? Unusual bruising for unknown reasons. ? A serious fall or if you hit your head (even if there is no bleeding).  Some medicines may interact with Xarelto and might increase your risk of bleeding while on Xarelto. To help avoid this, consult your healthcare provider or pharmacist prior to using any new prescription or non-prescription medications, including  herbals, vitamins, non-steroidal anti-inflammatory drugs (NSAIDs) and supplements.  This website has more information on Xarelto: [email or web address]

## 2018-11-18 NOTE — Progress Notes (Signed)
Orthopedic Tech Progress Note Patient Details:  Caitlin Irwin 22-Jul-1951 [id number]  Ortho Devices Type of Ortho Device: Prafo boot/shoe Ortho Device/Splint Location: right Ortho Device/Splint Interventions: Application, Ordered   Post Interventions Patient Tolerated: Well Instructions Provided: Care of device   [name] 11/18/2018, 1:22 PM

## 2018-11-18 NOTE — Progress Notes (Addendum)
Orthopedic Trauma Service Progress Note Weekend Coverage   Patient ID: Caitlin Irwin MRN: [id number] DOB/AGE: [birth year] 68 y.o.  Subjective:  Doing ok  Pain subtly improved Some nausea still but ok. Eating breakfast in bedside chair  Denies any CP or palpitations   EKG yesterday shows some PACs and prolonged QTc. Pt denies any symptoms    Review of Systems  Constitutional: Negative for chills and fever.  Respiratory: Negative for shortness of breath.   Cardiovascular: Negative for chest pain and palpitations.  Gastrointestinal: Positive for nausea. Negative for abdominal pain and vomiting.  Neurological: Negative for tingling and sensory change.    Objective:   VITALS:   Vitals:   11/17/18 0407 11/17/18 1452 11/17/18 2058 11/18/18 0500  BP: (!) 117/57 120/75 123/74 132/60  Pulse: 78 90 93 70  Resp: 16 16 16 18   Temp: 99 F (37.2 C) 98.5 F (36.9 C) 98.9 F (37.2 C) 99.6 F (37.6 C)  TempSrc: Oral Oral Oral Oral  SpO2: 93% 95% 91% 91%  Weight:      Height:        Estimated body mass index is 21.91 kg/m as calculated from the following:   Height as of this encounter: 4\' 11"  (1.499 m).   Weight as of this encounter: 49.2 kg.   Intake/Output      04/18 0701 - 04/19 0700 04/19 0701 - 04/20 0700   P.O. 360    I.V. (mL/kg)     IV Piggyback     Total Intake(mL/kg) 360 (7.3)    Urine (mL/kg/hr) 300 (0.3)    Blood     Total Output 300    Net +60         Urine Occurrence 2 x 1 x     LABS  Results for orders placed or performed during the hospital encounter of 11/13/18 (from the past 24 hour(s))  CBC     Status: Abnormal   Collection Time: 11/18/18  2:43 AM  Result Value Ref Range   WBC 12.4 (H) 4.0 - 10.5 K/uL   RBC 3.53 (L) 3.87 - 5.11 MIL/uL   Hemoglobin 10.5 (L) 12.0 - 15.0 g/dL   HCT 32.3 (L) 36.0 - 46.0 %   MCV 91.5 80.0 - 100.0 fL   MCH 29.7 26.0 - 34.0 pg   MCHC 32.5  30.0 - 36.0 g/dL   RDW 13.6 11.5 - 15.5 %   Platelets 406 (H) 150 - 400 K/uL   nRBC 0.0 0.0 - 0.2 %  Basic metabolic panel     Status: Abnormal   Collection Time: 11/18/18  2:43 AM  Result Value Ref Range   Sodium 135 135 - 145 mmol/L   Potassium 3.7 3.5 - 5.1 mmol/L   Chloride 96 (L) 98 - 111 mmol/L   CO2 28 22 - 32 mmol/L   Glucose, Bld 103 (H) 70 - 99 mg/dL   BUN 13 8 - 23 mg/dL   Creatinine, Ser 0.86 0.44 - 1.00 mg/dL   Calcium 9.3 8.9 - 10.3 mg/dL   GFR calc non Af Amer >60 >60 mL/min   GFR calc Af Amer >60 >60 mL/min   Anion gap 11 5 - 15     PHYSICAL EXAM:   Gen: sitting up in chair, NAD, appears better, eating breakfast  Lungs:CTA  anterior fields Cardiac: s1 and s2, irregular at times  Abd: + BS, NTND Ext:       Right Lower Extremity              Dressing c/d/i, aquacel dressing looks good              Hinged knee brace fitting well and is locked in full extension              R foot/ankle still resting in equinovarus                         Tolerates passive foot and ankle extension              Swelling controlled             No pain with passive stretching             EHL, FHL, lesser toe motor intact             Ankle flexion/extension/inversion/eversion grossly intact             + DP pulse             No DCT             Compartments are soft   Assessment/Plan: 2 Days Post-Op   Principal Problem:   Closed fracture of proximal end of right tibia and fibula Active Problems:   Hx of migraine headaches   Depression   History of hypothyroidism   Anti-infectives (From admission, onward)   Start     Dose/Rate Route Frequency Ordered Stop   11/16/18 1800  ceFAZolin (ANCEF) IVPB 1 g/50 mL premix     1 g 100 mL/hr over 30 Minutes Intravenous Every 8 hours 11/16/18 1505 11/17/18 0301   11/16/18 1202  vancomycin (VANCOCIN) powder  Status:  Discontinued       As needed 11/16/18 1202 11/16/18 1226   11/16/18 1201  tobramycin (NEBCIN) powder  Status:   Discontinued       As needed 11/16/18 1201 11/16/18 1226   11/16/18 0600  ceFAZolin (ANCEF) IVPB 2g/100 mL premix     2 g 200 mL/hr over 30 Minutes Intravenous To Short Stay 11/15/18 1727 11/16/18 1055   11/14/18 1830  ceFAZolin (ANCEF) IVPB 2g/100 mL premix     2 g 200 mL/hr over 30 Minutes Intravenous Every 6 hours 11/14/18 1618 11/15/18 0028   11/14/18 1130  ceFAZolin (ANCEF) IVPB 2g/100 mL premix     2 g 200 mL/hr over 30 Minutes Intravenous On call to O.R. 11/14/18 1120 11/14/18 1201    .  POD/HD#: 2  68 y/o female s/p fall with comminuted R tibial plateau fracture   -comminuted R tibial plateau fracture s/p ORIF              TDWB R leg             Hinged knee brace is on but locked in full extension                         Motion will start after first post op follow up in 1 week   Dressing changes as needed                           PT/OT  Ankle theraband and stretching program to prevent contracture              Ice and elevate    Will order prafo boot to help pt keep ankle/foot in a good resting position               - Pain management:             no tylenol as pt states it makes her very nauseated  Continue with ultram prn    - ABL anemia/Hemodynamics             Stable  - Medical issues              Per primary team                          EKG shows some abnormalities (PACs, prolonged QTc). Pt is asymptomatic. Would likely benefit from outpt follow up with cardiology but this can be likely evaluated by PCP and then refer if necessary. Check TSH    - DVT/PE prophylaxis:             xarelto    - ID:              periop abx completed    - Activity:             Up with therapies             TDWB R leg    - FEN/GI prophylaxis/Foley/Lines:             Diet as tolerated    - Dispo:             PT/OT             Will likely need SNF              Continue with inpatient care            hopeful for SNF tomorrow    Jari Pigg,  PA-C [phone] (C) 11/18/2018, 10:10 AM  [facility] [place] [address] [phone] Jenetta [phone] (F)

## 2018-11-19 LAB — CBC
HCT: 30.9 % — ABNORMAL LOW (ref 36.0–46.0)
Hemoglobin: 10.4 g/dL — ABNORMAL LOW (ref 12.0–15.0)
MCH: 30.2 pg (ref 26.0–34.0)
MCHC: 33.7 g/dL (ref 30.0–36.0)
MCV: 89.8 fL (ref 80.0–100.0)
Platelets: 403 10*3/uL — ABNORMAL HIGH (ref 150–400)
RBC: 3.44 MIL/uL — ABNORMAL LOW (ref 3.87–5.11)
RDW: 13.5 % (ref 11.5–15.5)
WBC: 10.5 10*3/uL (ref 4.0–10.5)
nRBC: 0 % (ref 0.0–0.2)

## 2018-11-19 MED ORDER — POLYETHYLENE GLYCOL 3350 17 G PO PACK
17.0000 g | PACK | Freq: Every day | ORAL | Status: DC
  Administered 2018-11-19: 17 g via ORAL
  Filled 2018-11-19 (×2): qty 1

## 2018-11-19 NOTE — TOC Progression Note (Signed)
Transition of Care [facility]) - Progression Note    Patient Details  Name: Caitlin Irwin MRN: [id number] Date of Birth: 1951/02/25  Transition of Care [facility] CM/SW [place], [place] Phone Number: 11/19/2018, 1:15 PM  Clinical Narrative:    Pt provided with offers, has selected [facility]. Authorization initiated with [employer], provided them with SNF choice. Continuing to follow.   Expected Discharge Plan: [place] Barriers to Discharge: Continued Medical Work up  Expected Discharge Plan and Services Expected Discharge Plan: [place]   Discharge Planning Services: CM Consult   Living arrangements for the past 2 months: Single Family Home                     Social Determinants of Health (SDOH) Interventions    Readmission Risk Interventions No flowsheet data found.

## 2018-11-19 NOTE — Progress Notes (Signed)
Patient Demographics:    Caitlin Irwin, is a 68 y.o. female, DOB - 03/26/1951, FBP:[id number]  Admit date - 11/13/2018   Admitting Physician Etta Quill, DO  Outpatient Primary MD for the patient is Levin Erp, MD  LOS - 6  Chief Complaint  Patient presents with   Fall        Subjective:    [name] today has no fevers,    No chest pain, drinking and eating okay  Assessment  & Plan :    Principal Problem:   Closed fracture of proximal end of right tibia and fibula Active Problems:   Hx of migraine headaches   Depression   History of hypothyroidism  Brief summary 68 year old female with past medical history of anxiety and depression plus chronic migraines who admitted on 11/13/2018 after mechanical fall while walking her dog leading to right tib-fib fracture, s/p Ext Fixation on 11/14/18, and ORIF on 11/16/18   A/p 1)Status post mechanical fall with closed fracture of proximal end of right tibia and fibula:--Overall progressing well, s/p external fixation on 11/14/18,  S/p  definitive fixation  11/16/18 ,  patient will be nonweightbearing with a hinged brace unlocked for the first 6 weeks.   .  DVT prophylaxis Xarelto 10 mg a day starting 11/17/18.  Pain control with PRN pain medication preferring oral medicines.  Follow up with ortho  will be scheduled in approximately 7 days for incision check and XRay.  2)Acute Blood Loss Anemia in a post-op pt--- Hgb stable at 10.5 (presurgery was  12.4)... Suspect due to  blood loss associated with initial fracture and  postop blood loss.... Monitor closely and transfuse as clinically indicated   3)  Depression/Anxiety: Stable,  c/n trazodone nightly Xanax 0.5 mg po bid prn  4)  History of hypothyroidism: Continue Synthroid   5) Hx of migraine headaches--- Imitrex as needed   6)Leukocytosis--- no fevers, patient does not look toxic, WBC down to  10.5, no URI or UTI symptoms  Disposition/Need for in-Hospital Stay- patient unable to be discharged at this time due s/p operative/definitive fixation of right tib-fib fracture on 11/16/2018--awaiting SNF rehab  Code Status : Full Code  Family Communication:   na  Disposition Plan  : Awaiting SNF rehab  Consultants:  Orthopedics  Procedures:  External fixation of right knee done 4/15  S/p  Definitive ORIF 4/17  Antimicrobials:  Preop Ancef  DVT prophylaxis: Xarelto 10 mg daily   Lab Results  Component Value Date   PLT 403 (H) 11/19/2018    Inpatient Medications  Scheduled Meds:  levothyroxine  75 mcg Oral Q0600   polyethylene glycol  17 g Oral Daily   potassium chloride  40 mEq Oral Once   rivaroxaban  10 mg Oral Daily   senna-docusate  2 tablet Oral BID   traZODone  100 mg Oral QHS   Continuous Infusions:  lactated ringers 10 mL/hr at 11/16/18 0854   lactated ringers     PRN Meds:.ALPRAZolam, HYDROmorphone (DILAUDID) injection, menthol-cetylpyridinium **OR** phenol, metoCLOPramide **OR** metoCLOPramide (REGLAN) injection, ondansetron (ZOFRAN) IV, oxyCODONE, traMADol    Anti-infectives (From admission, onward)   Start     Dose/Rate Route Frequency Ordered Stop   11/16/18 1800  ceFAZolin (ANCEF) IVPB 1  g/50 mL premix     1 g 100 mL/hr over 30 Minutes Intravenous Every 8 hours 11/16/18 1505 11/17/18 0301   11/16/18 1202  vancomycin (VANCOCIN) powder  Status:  Discontinued       As needed 11/16/18 1202 11/16/18 1226   11/16/18 1201  tobramycin (NEBCIN) powder  Status:  Discontinued       As needed 11/16/18 1201 11/16/18 1226   11/16/18 0600  ceFAZolin (ANCEF) IVPB 2g/100 mL premix     2 g 200 mL/hr over 30 Minutes Intravenous To Short Stay 11/15/18 1727 11/16/18 1055   11/14/18 1830  ceFAZolin (ANCEF) IVPB 2g/100 mL premix     2 g 200 mL/hr over 30 Minutes Intravenous Every 6 hours 11/14/18 1618 11/15/18 0028   11/14/18 1130  ceFAZolin  (ANCEF) IVPB 2g/100 mL premix     2 g 200 mL/hr over 30 Minutes Intravenous On call to O.R. 11/14/18 1120 11/14/18 1201        Objective:   Vitals:   11/18/18 0500 11/18/18 1428 11/18/18 2126 11/19/18 0411  BP: 132/60 121/81 125/75 (!) 105/52  Pulse: 70 (!) 110 (!) 51 (!) 42  Resp: 18 16 18 18   Temp: 99.6 F (37.6 C) 98.9 F (37.2 C) 99 F (37.2 C) 98.1 F (36.7 C)  TempSrc: Oral Oral Oral Oral  SpO2: 91% 95% 91% 90%  Weight:      Height:        Wt Readings from Last 3 Encounters:  11/16/18 49.2 kg  04/20/17 50.8 kg  03/30/17 45.4 kg    Intake/Output Summary (Last 24 hours) at 11/19/2018 1142 Last data filed at 11/19/2018 0901 Gross per 24 hour  Intake 237 ml  Output --  Net 237 ml   Physical Exam Patient is examined daily including today on 11/19/18 , exams remain the same as of yesterday except that has changed   Gen:- awake, alert HEENT:- [place].AT, No sclera icterus Neck-Supple Neck,No JVD,.  Lungs-  CTAB , fair symmetrical air movement CV- S1, S2 normal, regular  Abd-  +ve B.Sounds, Abd Soft, No tenderness,    Extremity/Skin:-   pedal pulses present, Right leg splinted/dressing, good cap refill Psych-affect is appropriate, oriented x3 Neuro-no new focal deficits, no tremors   Data Review:   Micro Results Recent Results (from the past 240 hour(s))  Surgical pcr screen     Status: None   Collection Time: 11/14/18 12:25 AM  Result Value Ref Range Status   MRSA, PCR NEGATIVE NEGATIVE Final   Staphylococcus aureus NEGATIVE NEGATIVE Final    Comment: (NOTE) The Xpert SA Assay (FDA approved for NASAL specimens in patients 9 years of age and older), is one component of a comprehensive surveillance program. It is not intended to diagnose infection nor to guide or monitor treatment. Performed at [facility], [place] [address], [place], [place] [address]     Radiology Reports Dg Chest 1 View  Result Date: 11/13/2018 CLINICAL DATA:  Pt c/o proximal and  central right lower leg pain after falling down steps in her home today. Hx of previous right knee surgery to repair ligaments. No chest complaints. No hx of heart or lung problems. Pt is a nonsmoker. Patient and tech wore masks. EXAM: CHEST  1 VIEW COMPARISON:  03/30/2017 FINDINGS: Numerous leads and wires project over the chest. Patient rotated left. Midline trachea. Mild cardiomegaly. Remote posterolateral left eighth rib fracture. No pleural effusion or pneumothorax. Clear lungs. IMPRESSION: No acute cardiopulmonary disease. Electronically  Signed   By: Abigail Miyamoto M.D.   On: 11/13/2018 21:13   Dg Knee 2 Views Right  Result Date: 11/13/2018 CLINICAL DATA:  Initial encounter for trauma and pain. EXAM: RIGHT KNEE - 1-2 VIEW COMPARISON:  None. FINDINGS: AP and lateral views. Comminuted proximal tibia and fibular fractures. The proximal tibial fracture may extend to the joint surface, but is suboptimally evaluated. Oblique component extends approximately 8 cm distal to the tibial plateau. No significant knee joint effusion. There is pretibial soft tissue swelling. Ligament attachment site about the anterior tibia may indicate prior ACL repair. IMPRESSION: Comminuted proximal tibia and fibular fractures. Consider CT to evaluate for intra-articular extension at the tibial plateau. Electronically Signed   By: Abigail Miyamoto M.D.   On: 11/13/2018 21:16   Dg Tibia/fibula Right  Result Date: 11/13/2018 CLINICAL DATA:  Recent fall with knee pain, initial encounter EXAM: RIGHT TIBIA AND FIBULA - 2 VIEW COMPARISON:  None. FINDINGS: Comminuted proximal fibular fracture is noted as well as comminuted proximal tibial fracture. Mild impaction at the fracture site within the proximal tibia is noted. Postsurgical changes are noted in the proximal tibia as well. Midshaft comminuted fibular fracture is seen. No distal tibial or fibular fracture is noted IMPRESSION: Comminuted proximal and midshaft fibular fractures with  comminuted proximal tibial fracture with impaction at the fracture site. Electronically Signed   By: Inez Catalina M.D.   On: 11/13/2018 21:09   Dg Ankle Complete Right  Result Date: 11/14/2018 CLINICAL DATA:  Right ankle pain. Proximal tibia and fibula fractures. EXAM: RIGHT ANKLE - COMPLETE 3+ VIEW COMPARISON:  Radiographs dated 11/13/2018 FINDINGS: There is no evidence of ankle fracture, dislocation, or joint effusion. There is an incompletely visualized fracture of the midshaft right fibula. There is no evidence of arthropathy or other focal bone abnormality. Soft tissues are unremarkable. IMPRESSION: Normal right ankle. Right mid fibula fracture as described on prior exams. Electronically Signed   By: Lorriane Shire M.D.   On: 11/14/2018 10:16   Ct Cervical Spine Wo Contrast  Result Date: 11/13/2018 CLINICAL DATA:  Fall.  Pain. EXAM: CT CERVICAL SPINE WITHOUT CONTRAST TECHNIQUE: Multidetector CT imaging of the cervical spine was performed without intravenous contrast. Multiplanar CT image reconstructions were also generated. COMPARISON:  02/20/2018 FINDINGS: Alignment: Spinal visualization through the bottom of T2. Maintenance of vertebral body height and alignment across these levels. Skull base and vertebrae: Left sided craniotomy with similar hyperattenuating left cerebellar rounded structure which could be postoperative. No acute fracture within the cervical spine identified. There is facet arthropathy bilaterally. C1-2 articulation within normal limits on coronal reformats Soft tissues and spinal canal: Prevertebral soft tissues are unremarkable. Disc levels: Loss of intervertebral disc height at multiple levels, most significant at C6-7. Upper chest: No apical pneumothorax. Other: None. IMPRESSION: Cervical spondylosis, without acute osseous abnormality. Electronically Signed   By: Abigail Miyamoto M.D.   On: 11/13/2018 20:59   Ct Knee Right Wo Contrast  Result Date: 11/13/2018 CLINICAL DATA:   Proximal tibial and fibular fractures, evaluate for possible articular involvement EXAM: CT OF THE RIGHT KNEE WITHOUT CONTRAST TECHNIQUE: Multidetector CT imaging of the right knee was performed according to the standard protocol. Multiplanar CT image reconstructions were also generated. COMPARISON:  None. FINDINGS: Bones/Joint/Cartilage Comminuted fracture of the proximal fibula is noted with mild impaction at the fracture site. Additionally significantly comminuted and impacted fracture of the proximal tibial metaphysis is noted. The fracture fragments extend into the proximal diaphysis as well. There is  impaction of the posterolateral aspect of the tibia superiorly into the region of the tibial plateau although no definitive articular involvement is seen. Changes of prior ligament repair are noted with fixation staple in the anterior aspect of the tibia. Subchondral sclerosis is noted in the lateral tibial plateau as well as some mild degenerative changes of the knee joint. Distal femur is within normal limits. Midshaft fibular fracture is not evaluated on this exam. Ligaments Suboptimally assessed by CT.  Cruciate ligaments appear intact. Muscles and Tendons No specific muscular abnormality is noted. No significant muscular hematoma is seen. Soft tissues Changes are noted in the subcutaneous tissues consistent with edema. No significant joint effusion is noted. IMPRESSION: Comminuted proximal tibial and fibular fractures with impaction worse in the proximal tibia with extension of posterior fragments to just below the articular surface within posterior aspect of the tibial plateaus. No definitive intra-articular involvement is seen. Electronically Signed   By: Inez Catalina M.D.   On: 11/13/2018 22:27   Dg Knee Complete 4 Views Right  Result Date: 11/16/2018 CLINICAL DATA:  Open reduction and internal fixation of right tibial plateau fracture. EXAM: RIGHT KNEE - COMPLETE 4+ VIEW; DG C-ARM 61-120 MIN Radiation  exposure index: 5.59 mGy. COMPARISON:  Radiograph of November 14, 2018. FINDINGS: Five intraoperative fluoroscopic images were obtained of the right knee. These demonstrate surgical internal fixation of right tibial plateau fracture. Good alignment of fracture components is noted. IMPRESSION: Status post surgical internal fixation of right tibial plateau fracture. Electronically Signed   By: Marijo Conception M.D.   On: 11/16/2018 12:18   Dg Knee Complete 4 Views Right  Result Date: 11/14/2018 CLINICAL DATA:  Status post external fixation of right knee. EXAM: DG C-ARM 61-120 MIN; RIGHT KNEE - COMPLETE 4+ VIEW FLUOROSCOPY TIME:  17 seconds. COMPARISON:  Radiographs of November 13, 2018. FINDINGS: Four intraoperative fluoroscopic images of the right lower extremity demonstrate external fixation of mildly displaced and probably comminuted proximal tibial fracture. Fixation screws are seen in the right femur and right tibia. IMPRESSION: Status post external fixation of mildly displaced and probably comminuted proximal right tibial fracture. Electronically Signed   By: Marijo Conception M.D.   On: 11/14/2018 14:27   Dg Knee Right Port  Result Date: 11/16/2018 CLINICAL DATA:  Postop ORIF. EXAM: PORTABLE RIGHT KNEE - 1-2 VIEW COMPARISON:  11/16/2018 FINDINGS: Two-view show ORIF for tibial plateau fracture with lateral plate and screws as well as a tibial plateau staple. Proximal fibular fracture as shown previously. IMPRESSION: No complication seen following ORIF for comminuted proximal tibial fracture Electronically Signed   By: Nelson Chimes M.D.   On: 11/16/2018 13:43   Dg Knee Right Port  Result Date: 11/14/2018 CLINICAL DATA:  Postoperative check. EXAM: PORTABLE RIGHT KNEE - 1-2 VIEW COMPARISON:  11/14/2018 FINDINGS: Interval placement of an external fixator device with a fixation screw in the mid/distal femur and screws in the mid tibia. Again noted is the comminuted displaced fracture involving the proximal tibia  and displaced fracture involving the proximal fibula. Again noted is surgical hardware in the anterior proximal tibia. IMPRESSION: Interval placement of an external fixator device in right lower extremity. Electronically Signed   By: Markus Daft M.D.   On: 11/14/2018 15:47   Dg C-arm 1-60 Min  Result Date: 11/16/2018 CLINICAL DATA:  Open reduction and internal fixation of right tibial plateau fracture. EXAM: RIGHT KNEE - COMPLETE 4+ VIEW; DG C-ARM 61-120 MIN Radiation exposure index: 5.59 mGy. COMPARISON:  Radiograph of November 14, 2018. FINDINGS: Five intraoperative fluoroscopic images were obtained of the right knee. These demonstrate surgical internal fixation of right tibial plateau fracture. Good alignment of fracture components is noted. IMPRESSION: Status post surgical internal fixation of right tibial plateau fracture. Electronically Signed   By: Marijo Conception M.D.   On: 11/16/2018 12:18   Dg C-arm 1-60 Min  Result Date: 11/14/2018 CLINICAL DATA:  Status post external fixation of right knee. EXAM: DG C-ARM 61-120 MIN; RIGHT KNEE - COMPLETE 4+ VIEW FLUOROSCOPY TIME:  17 seconds. COMPARISON:  Radiographs of November 13, 2018. FINDINGS: Four intraoperative fluoroscopic images of the right lower extremity demonstrate external fixation of mildly displaced and probably comminuted proximal tibial fracture. Fixation screws are seen in the right femur and right tibia. IMPRESSION: Status post external fixation of mildly displaced and probably comminuted proximal right tibial fracture. Electronically Signed   By: Marijo Conception M.D.   On: 11/14/2018 14:27     CBC Recent Labs  Lab 11/13/18 2019 11/15/18 0127 11/16/18 0129 11/17/18 0227 11/18/18 0243 11/19/18 0308  WBC 14.8* 7.6 8.0 9.4 12.4* 10.5  HGB 12.4 10.1* 10.5* 10.0* 10.5* 10.4*  HCT 38.1 30.7* 33.0* 30.8* 32.3* 30.9*  PLT 294 298 303 321 406* 403*  MCV 90.5 89.8 90.2 89.5 91.5 89.8  MCH 29.5 29.5 28.7 29.1 29.7 30.2  MCHC 32.5 32.9 31.8  32.5 32.5 33.7  RDW 13.2 13.2 13.2 13.2 13.6 13.5  LYMPHSABS 1.5  --   --   --   --   --   MONOABS 0.8  --   --   --   --   --   EOSABS 0.1  --   --   --   --   --   BASOSABS 0.0  --   --   --   --   --     Chemistries  Recent Labs  Lab 11/13/18 2019 11/15/18 0127 11/16/18 0129 11/18/18 0243  NA 137 135 137 135  K 4.0 3.9 3.2* 3.7  CL 106 100 99 96*  CO2 22 25 32 28  GLUCOSE 116* 122* 102* 103*  BUN 12 10 9 13   CREATININE 0.85 0.71 0.67 0.86  CALCIUM 9.2 8.9 9.5 9.3   ------------------------------------------------------------------------------------------------------------------ No results for input(s): CHOL, HDL, LDLCALC, TRIG, CHOLHDL, LDLDIRECT in the last 72 hours.  Lab Results  Component Value Date   HGBA1C (H) 11/25/2009    5.7 (NOTE)                                                                       According to the ADA Clinical Practice Recommendations for 2011, when HbA1c is used as a screening test:   >=6.5%   Diagnostic of Diabetes Mellitus           (if abnormal result  is confirmed)  5.7-6.4%   Increased risk of developing Diabetes Mellitus  References:Diagnosis and Classification of Diabetes Mellitus,Diabetes HYWV,3710,62(IRSWN 1):S62-S69 and Standards of Medical Care in         Diabetes - 2011,Diabetes IOEV,0350,09  (Suppl 1):S11-S61.   ------------------------------------------------------------------------------------------------------------------ Recent Labs    11/18/18 1216  TSH 0.732   ------------------------------------------------------------------------------------------------------------------ No results for input(s): VITAMINB12, FOLATE, FERRITIN, TIBC, IRON,  RETICCTPCT in the last 72 hours.  Coagulation profile No results for input(s): INR, PROTIME in the last 168 hours.  No results for input(s): DDIMER in the last 72 hours.  Cardiac Enzymes No results for input(s): CKMB, TROPONINI, MYOGLOBIN in the last 168 hours.  Invalid input(s):  CK ------------------------------------------------------------------------------------------------------------------ No results found for: BNP   Roxan Hockey M.D on 11/19/2018 at 11:42 AM  Go to [email or web address] - for contact info  [facility] - Office  [phone]

## 2018-11-19 NOTE — Progress Notes (Addendum)
ORTHOPAEDIC PROGRESS NOTE  s/p Procedure(s): OPEN REDUCTION INTERNAL FIXATION (ORIF) TIBIAL PLATEAU R  SUBJECTIVE: NAD  OBJECTIVE: PE: RLE: brace in place, hinge locked in extension, NVID, aquacel CDI  Vitals:   11/19/18 0411 11/19/18 1401  BP: (!) 105/52 115/64  Pulse: (!) 42 90  Resp: 18 18  Temp: 98.1 F (36.7 C) 98.6 F (37 C)  SpO2: 90% 97%     ASSESSMENT: Caitlin Irwin is a 68 y.o. female doing well postoperatively.  PLAN: Weightbearing: TDWB RLE Insicional and dressing care: Dressings left intact until follow-up Orthopedic device(s): hinged brace locked in extension due to lateral ligament injury Showering: prn, ok to take down ace wrap and shower but immedaitely put brace back on after VTE prophylaxis: Xarelto 15mg  6 weeks Pain control: prn meds Follow - up plan: 11/23/18 with me,  in person Contact information:  Weekdays 8-5 Ophelia Charter MD [phone], After hours and holidays please check [email or web address] for group call information for [facility]

## 2018-11-19 NOTE — Plan of Care (Signed)
Problem: Education: Goal: Knowledge of General Education information will improve Description Including pain rating scale, medication(s)/side effects and non-pharmacologic comfort measures Outcome: Progressing   Problem: Health Behavior/Discharge Planning: Goal: Ability to manage health-related needs will improve Outcome: Progressing   Problem: Activity: Goal: Risk for activity intolerance will decrease Outcome: Progressing   Problem: Coping: Goal: Level of anxiety will decrease Outcome: Progressing   Problem: Elimination: Goal: Will not experience complications related to urinary retention Outcome: Progressing   Problem: Pain Managment: Goal: General experience of comfort will improve Outcome: Progressing   Problem: Safety: Goal: Ability to remain free from injury will improve Outcome: Progressing   Problem: Skin Integrity: Goal: Risk for impaired skin integrity will decrease Outcome: Progressing

## 2018-11-19 NOTE — TOC Progression Note (Signed)
Transition of Care [facility]) - Progression Note    Patient Details  Name: Caitlin Irwin MRN: [id number] Date of Birth: 1951-05-03  Transition of Care [facility]) CM/SW [place], [place] Phone Number: 11/19/2018, 3:49 PM  Clinical Narrative:    Approved Josem Kaufmann [phone] Valid for 7 days for [facility], starting tomorrow. 4/21.  Expected Discharge Plan: [place] Barriers to Discharge: Continued Medical Work up  Expected Discharge Plan and Services Expected Discharge Plan: [place]   Discharge Planning Services: CM Consult   Living arrangements for the past 2 months: Single Family Home                           Social Determinants of Health (SDOH) Interventions    Readmission Risk Interventions No flowsheet data found.

## 2018-11-19 NOTE — Progress Notes (Signed)
Physical Therapy Treatment Patient Details Name: Caitlin Irwin MRN: [id number] DOB: 1950/11/04 Today's Date: 11/19/2018    History of Present Illness Pt is a 68 y.o. female admitted 11/13/18 after fall sustaining R tibial plateau fx; now s/p R tib/fib ex fix 4/15. Returned to OR 4/17 for ORIF. PMH includes cataracts, IBS, anxiety, depression, chronic LBP.    PT Comments    Pt performed supine to sit, sit to stand and stand pivot during session.  Pt continues to require external assistance of moderate+1 to complete.  On arrival RLE twisted in bed.  Realigned bledsoe before mobilization.  Pt resting in recliner post session with needs in reach.  Based on function she continues to benefit from SNF placement at d/c.      Follow Up Recommendations  SNF     Equipment Recommendations  None recommended by PT    Recommendations for Other Services       Precautions / Restrictions Precautions Precautions: Fall Restrictions Weight Bearing Restrictions: Yes RLE Weight Bearing: Non weight bearing    Mobility  Bed Mobility Overal bed mobility: Needs Assistance Bed Mobility: Supine to Sit     Supine to sit: Mod assist     General bed mobility comments: pt requires increased time and effort, cueing for technique and safety; mod assist for R LE support and scooting hips towards EOB  Transfers Overall transfer level: Needs assistance Equipment used: Rolling walker (2 wheeled) Transfers: Sit to/from Stand Sit to Stand: Mod assist Stand pivot transfers: Mod assist       General transfer comment: Pt required moderate assistance to boost into standing.  Pt required moderate assistance to pivot and move RW to bring chair behind patient.    Ambulation/Gait                 Stairs             Wheelchair Mobility    Modified Rankin (Stroke Patients Only)       Balance Overall balance assessment: Needs assistance Sitting-balance support: Feet supported Sitting  balance-Leahy Scale: Fair     Standing balance support: Bilateral upper extremity supported;During functional activity Standing balance-Leahy Scale: Poor Standing balance comment: reliant on BUE and external support at this time                             Cognition Arousal/Alertness: Awake/alert Behavior During Therapy: Anxious Overall Cognitive Status: Within Functional Limits for tasks assessed                                 General Comments: anxious throughout session, requires cueing for redirection and deep breathing       Exercises      General Comments        Pertinent Vitals/Pain Pain Assessment: 0-10 Pain Score: 8  Pain Location: RLE Pain Descriptors / Indicators: Throbbing;Grimacing;Guarding Pain Intervention(s): Monitored during session;Repositioned    Home Living                      Prior Function            PT Goals (current goals can now be found in the care plan section) Acute Rehab PT Goals Patient Stated Goal: Be able to care for self again Potential to Achieve Goals: Fair Progress towards PT goals: Progressing toward goals    Frequency  Min 3X/week      PT Plan Current plan remains appropriate    Co-evaluation              AM-PAC PT "6 Clicks" Mobility   Outcome Measure  Help needed turning from your back to your side while in a flat bed without using bedrails?: A Lot Help needed moving from lying on your back to sitting on the side of a flat bed without using bedrails?: A Lot Help needed moving to and from a bed to a chair (including a wheelchair)?: A Lot Help needed standing up from a chair using your arms (e.g., wheelchair or bedside chair)?: A Lot Help needed to walk in hospital room?: Total Help needed climbing 3-5 steps with a railing? : Total 6 Click Score: 10    End of Session Equipment Utilized During Treatment: Gait belt Activity Tolerance: Patient limited by pain Patient  left: in chair;with call bell/phone within reach;with chair alarm set Nurse Communication: Mobility status PT Visit Diagnosis: Other abnormalities of gait and mobility (R26.89);Pain Pain - Right/Left: Right Pain - part of body: Leg     Time: 1540-1559 PT Time Calculation (min) (ACUTE ONLY): 19 min  Charges:  $Therapeutic Activity: 8-22 mins                     Governor Rooks, PTA Acute Rehabilitation Services Pager [phone] Office [phone]     Kenli Waldo Eli Hose 11/19/2018, 5:40 PM

## 2018-11-19 NOTE — Care Management Important Message (Signed)
Important Message  Patient Details  Name: Caitlin Irwin MRN: [id number] Date of Birth: 28-Feb-1951   Medicare Important Message Given:  Yes    Orbie Pyo 11/19/2018, 3:45 PM

## 2018-11-20 ENCOUNTER — Encounter (HOSPITAL_COMMUNITY): Payer: Self-pay | Admitting: Orthopaedic Surgery

## 2018-11-20 DIAGNOSIS — R2689 Other abnormalities of gait and mobility: Secondary | ICD-10-CM | POA: Diagnosis not present

## 2018-11-20 DIAGNOSIS — S82141D Displaced bicondylar fracture of right tibia, subsequent encounter for closed fracture with routine healing: Secondary | ICD-10-CM | POA: Diagnosis not present

## 2018-11-20 DIAGNOSIS — E7849 Other hyperlipidemia: Secondary | ICD-10-CM | POA: Diagnosis not present

## 2018-11-20 DIAGNOSIS — F329 Major depressive disorder, single episode, unspecified: Secondary | ICD-10-CM | POA: Diagnosis not present

## 2018-11-20 DIAGNOSIS — Z8639 Personal history of other endocrine, nutritional and metabolic disease: Secondary | ICD-10-CM | POA: Diagnosis not present

## 2018-11-20 DIAGNOSIS — M6281 Muscle weakness (generalized): Secondary | ICD-10-CM | POA: Diagnosis not present

## 2018-11-20 DIAGNOSIS — K279 Peptic ulcer, site unspecified, unspecified as acute or chronic, without hemorrhage or perforation: Secondary | ICD-10-CM | POA: Diagnosis not present

## 2018-11-20 DIAGNOSIS — Z8669 Personal history of other diseases of the nervous system and sense organs: Secondary | ICD-10-CM | POA: Diagnosis not present

## 2018-11-20 DIAGNOSIS — G43909 Migraine, unspecified, not intractable, without status migrainosus: Secondary | ICD-10-CM | POA: Diagnosis not present

## 2018-11-20 DIAGNOSIS — S82101K Unspecified fracture of upper end of right tibia, subsequent encounter for closed fracture with nonunion: Secondary | ICD-10-CM | POA: Diagnosis not present

## 2018-11-20 DIAGNOSIS — D490 Neoplasm of unspecified behavior of digestive system: Secondary | ICD-10-CM | POA: Diagnosis not present

## 2018-11-20 DIAGNOSIS — I10 Essential (primary) hypertension: Secondary | ICD-10-CM | POA: Diagnosis not present

## 2018-11-20 DIAGNOSIS — F39 Unspecified mood [affective] disorder: Secondary | ICD-10-CM | POA: Diagnosis not present

## 2018-11-20 DIAGNOSIS — R41841 Cognitive communication deficit: Secondary | ICD-10-CM | POA: Diagnosis not present

## 2018-11-20 DIAGNOSIS — S82401G Unspecified fracture of shaft of right fibula, subsequent encounter for closed fracture with delayed healing: Secondary | ICD-10-CM | POA: Diagnosis not present

## 2018-11-20 DIAGNOSIS — K5901 Slow transit constipation: Secondary | ICD-10-CM | POA: Diagnosis not present

## 2018-11-20 DIAGNOSIS — S82831K Other fracture of upper and lower end of right fibula, subsequent encounter for closed fracture with nonunion: Secondary | ICD-10-CM | POA: Diagnosis not present

## 2018-11-20 DIAGNOSIS — S82191S Other fracture of upper end of right tibia, sequela: Secondary | ICD-10-CM | POA: Diagnosis not present

## 2018-11-20 DIAGNOSIS — W19XXXA Unspecified fall, initial encounter: Secondary | ICD-10-CM | POA: Diagnosis not present

## 2018-11-20 DIAGNOSIS — G500 Trigeminal neuralgia: Secondary | ICD-10-CM | POA: Diagnosis not present

## 2018-11-20 DIAGNOSIS — R0902 Hypoxemia: Secondary | ICD-10-CM | POA: Diagnosis not present

## 2018-11-20 DIAGNOSIS — S82201A Unspecified fracture of shaft of right tibia, initial encounter for closed fracture: Secondary | ICD-10-CM | POA: Diagnosis not present

## 2018-11-20 DIAGNOSIS — Z4789 Encounter for other orthopedic aftercare: Secondary | ICD-10-CM | POA: Diagnosis not present

## 2018-11-20 DIAGNOSIS — S82201 Unspecified fracture of shaft of right tibia: Secondary | ICD-10-CM | POA: Diagnosis not present

## 2018-11-20 DIAGNOSIS — T849XXA Unspecified complication of internal orthopedic prosthetic device, implant and graft, initial encounter: Secondary | ICD-10-CM | POA: Diagnosis not present

## 2018-11-20 DIAGNOSIS — D62 Acute posthemorrhagic anemia: Secondary | ICD-10-CM | POA: Diagnosis not present

## 2018-11-20 DIAGNOSIS — S82401A Unspecified fracture of shaft of right fibula, initial encounter for closed fracture: Secondary | ICD-10-CM | POA: Diagnosis not present

## 2018-11-20 DIAGNOSIS — Z7401 Bed confinement status: Secondary | ICD-10-CM | POA: Diagnosis not present

## 2018-11-20 DIAGNOSIS — R2681 Unsteadiness on feet: Secondary | ICD-10-CM | POA: Diagnosis not present

## 2018-11-20 DIAGNOSIS — M2550 Pain in unspecified joint: Secondary | ICD-10-CM | POA: Diagnosis not present

## 2018-11-20 DIAGNOSIS — S82201G Unspecified fracture of shaft of right tibia, subsequent encounter for closed fracture with delayed healing: Secondary | ICD-10-CM | POA: Diagnosis not present

## 2018-11-20 MED ORDER — SENNOSIDES-DOCUSATE SODIUM 8.6-50 MG PO TABS: 2.0000 | ORAL_TABLET | Freq: Two times a day (BID) | ORAL | 2 refills | Status: DC

## 2018-11-20 MED ORDER — ALPRAZOLAM 0.5 MG PO TABS: 0.5000 mg | ORAL_TABLET | Freq: Two times a day (BID) | ORAL | 0 refills | Status: DC | PRN

## 2018-11-20 MED ORDER — POLYETHYLENE GLYCOL 3350 17 G PO PACK: 17.0000 g | PACK | Freq: Every day | ORAL | 0 refills | Status: DC | PRN

## 2018-11-20 MED ORDER — TRAZODONE HCL 100 MG PO TABS: 100.0000 mg | ORAL_TABLET | Freq: Every day | ORAL | 2 refills | Status: DC

## 2018-11-20 MED ORDER — SPIRONOLACTONE 50 MG PO TABS: 50.0000 mg | ORAL_TABLET | Freq: Every day | ORAL | 1 refills | Status: DC

## 2018-11-20 MED ORDER — OXYCODONE HCL 5 MG PO TABS: ORAL_TABLET | ORAL | 0 refills | Status: DC

## 2018-11-20 NOTE — Progress Notes (Signed)
Report given to daniel, Therapist, sports at [facility]

## 2018-11-20 NOTE — Progress Notes (Signed)
Discharge to [facility], transported by [facility]. Patient was alert and oriented prior to transfer, not in any distress.

## 2018-11-20 NOTE — Social Work (Signed)
Clinical Social Worker facilitated patient discharge including contacting patient family and facility to confirm patient discharge plans.  Clinical information faxed to facility and family agreeable with plan.  CSW arranged ambulance transport via [facility] to [facility] [phone]. RN to call [phone]  with report prior to discharge.  Clinical Social Worker will sign off for now as social work intervention is no longer needed. Please consult [place] again if new need arises.  Westley Hummer, MSW, [place] Social Worker [phone]

## 2018-11-20 NOTE — Progress Notes (Signed)
Orthopedic Tech Progress Note Patient Details:  Caitlin Irwin 12-21-1950 [id number]  Patient ID: Caitlin Irwin, female   DOB: 1951/06/27, 68 y.o.   MRN: [id number]   [name] 11/20/2018, 8:56 [facility] [employer] for right hinged knee brace.

## 2018-11-20 NOTE — Discharge Summary (Signed)
Caitlin Irwin, is a 68 y.o. female  DOB 08-Mar-1951  MRN [id number].  Admission date:  11/13/2018  Admitting Physician  Etta Quill, DO  Discharge Date:  11/20/2018   Primary MD  Levin Erp, MD  Recommendations for primary care physician for things to follow:   Transfer to SNF rehab   Admission Diagnosis  Pre-op chest exam [T02.409] Fall, initial encounter [W19.XXXA] Other closed fracture of shaft of right fibula, initial encounter [S82.491A] Other closed fracture of proximal end of right tibia, initial encounter [S82.191A]   Discharge Diagnosis  Pre-op chest exam [B35.329] Fall, initial encounter [W19.XXXA] Other closed fracture of shaft of right fibula, initial encounter [S82.491A] Other closed fracture of proximal end of right tibia, initial encounter [S82.191A]    Principal Problem:   Closed fracture of proximal end of right tibia and fibula Active Problems:   Hx of migraine headaches   Depression   History of hypothyroidism      Past Medical History:  Diagnosis Date   Accidental poisoning by aspirin 09/2014   Anemia    Anxiety    Arthritis    "right knee; right shoulder" (09/15/2015)   Ataxia 03/24/2014   Cataract    both eyes   Chronic lower back pain    scoliosis   Depression    GERD (gastroesophageal reflux disease)    H/O hiatal hernia    High cholesterol    History of blood transfusion    "related to OR"   Hypersomnia    IBS (irritable bowel syndrome)    Migraines    "maybe once/week now" (09/15/2015)   Osteoporosis    Other constipation    Parotid tumor    PONV (postoperative nausea and vomiting)    hx of " getting too much" - 1998, slow to take up by body    PUD (peptic ulcer disease)    Trigeminal neuralgia    S/P radiation therapy    Unspecified hypothyroidism     Past Surgical History:  Procedure Laterality Date   ABDOMINAL  HYSTERECTOMY     ANKLE FRACTURE SURGERY Left    ANTERIOR LUMBAR FUSION  04/2010   L4-5; L5-S1   BACK SURGERY     BRAIN SURGERY     parotid tumor removed    CHOLECYSTECTOMY OPEN     EPIGASTRIC HERNIA REPAIR  09/15/2015   Procedure: OPEN HERNIA REPAIR EPIGASTRIC ADULT;  Surgeon: Greer Pickerel, MD;  Location: [facility];  Service: General;;   ESOPHAGOGASTRODUODENOSCOPY  06/12/2012   Procedure: ESOPHAGOGASTRODUODENOSCOPY (EGD);  Surgeon: Lear Ng, MD;  Location: [facility];  Service: Endoscopy;  Laterality: N/A;   EXTERNAL FIXATION LEG Right 11/14/2018   Procedure: EXTERNAL FIXATION LEG;  Surgeon: Hiram Gash, MD;  Location: [facility];  Service: Orthopedics;  Laterality: Right;   FRACTURE SURGERY     gamma knife for trig neuralgia  12/27/11   HERNIA REPAIR     KNEE CARTILAGE SURGERY Right 1981   "converted from scope to open during the OR; ligament repair"  LAMINECTOMY AND MICRODISCECTOMY LUMBAR SPINE  1980   L4-5   LAPAROSCOPIC EPIGASTRIC HERNIA REPAIR  09/15/2015   open   PAROTID GLAND TUMOR EXCISION     benign   SHOULDER ARTHROSCOPY W/ ROTATOR CUFF REPAIR Right 11-2014   TRIGEMINAL NERVE DECOMPRESSION     to stop migraines and it did not work      HPI  from the history and physical done on the day of admission:   Chief Complaint: Leg pain, fall  HPI: Caitlin Irwin is a 68 y.o. female with medical history significant of anxiety and depression, HLD, chronic migraines.  Patient presents tot he ED following mechanical fall down stairs at home.  Was walking dog, tripped, landed on R buttock.  Severe R Knee pain and deformity following fall.   ED Course: R tib/fib fx, neurovascularly intact.  Ortho planning OREF in AM.    Hospital Course:   Brief summary 68 year old female with past medical history of anxiety and depression plus chronic migraines who admitted on 11/13/2018 after mechanical fall while walking her dog leading to right tib-fib fracture, s/p  Ext Fixation on 11/14/18, and ORIF on 11/16/18   A/p 1)Status post mechanical fall with closed fracture of proximal end of right tibia and fibula:-----Overall progressing well, s/p external fixation on 11/14/18,  S/p  definitive fixation  11/16/18 ,  patient will benonweightbearing with a hinged brace unlocked for the first 6 weeks.  . DVT prophylaxis Xarelto 10 mg a day starting 11/17/18. Pain control with PRN pain medication preferring oral medicines. Follow up with ortho  will be scheduled in approximately 7 days for incision check and XRay.  2)Acute Blood Loss Anemia in a post-op pt--- Hgb stable > 10 (presurgery was  12.4)... Suspect due to  blood loss associated with initial fracture and  postop blood loss.... Monitor closely and transfuse as clinically indicated   3)Depression/Anxiety: Stable,  c/n trazodone nightly and Xanax  prn  4)History of hypothyroidism: Continue Synthroid   5)Hx of migraine headaches--- Imitrex as needed   6)Leukocytosis--- no fevers, patient does not look toxic, WBC down to 10.5, no URI or UTI symptoms  Code Status : Full Code  Family Communication:   na  Disposition Plan  :  SNF rehab  Consultants:  Orthopedics  Procedures:  External fixation of right knee done 4/15  S/p  Definitive ORIF 4/17  Antimicrobials:  Preop Ancef  DVT prophylaxis: Xarelto 10 mg daily  Discharge Condition: stable  Follow UP   Contact information for follow-up providers    Hiram Gash, MD. Schedule an appointment as soon as possible for a visit in 2 weeks.   Specialty:  Orthopedic Surgery Contact information: [address] [place] [address] [phone]            Contact information for after-discharge care    Destination    [facility] Preferred SNF.   Service:  Skilled Nursing Contact information: [place] [phone]                  Diet and  Activity recommendation:  As advised  Discharge Instructions    Discharge Instructions    Call MD for:  difficulty breathing, headache or visual disturbances   Complete by:  As directed    Call MD for:  persistant dizziness or light-headedness   Complete by:  As directed    Call MD for:  persistant nausea and vomiting   Complete  by:  As directed    Call MD for:  redness, tenderness, or signs of infection (pain, swelling, redness, odor or green/yellow discharge around incision site)   Complete by:  As directed    Call MD for:  severe uncontrolled pain   Complete by:  As directed    Call MD for:  temperature >100.4   Complete by:  As directed    Diet - low sodium heart healthy   Complete by:  As directed    Walk with assistance   Complete by:  As directed    Activity limitations as advised by orthopedic surgeon        Discharge Medications     Allergies as of 11/20/2018      Reactions   Acetaminophen Nausea And Vomiting      Medication List    STOP taking these medications   traMADol 50 MG tablet Commonly known as:  ULTRAM     TAKE these medications   acetaminophen 500 MG tablet Commonly known as:  TYLENOL Take 2 tablets (1,000 mg total) by mouth every 8 (eight) hours for 14 days.   ALPRAZolam 0.5 MG tablet Commonly known as:  XANAX Take 1 tablet (0.5 mg total) by mouth 2 (two) times daily as needed for anxiety. What changed:    medication strength  how much to take  how to take this  when to take this  reasons to take this  additional instructions   estradiol 1 MG tablet Commonly known as:  ESTRACE Take 1 mg by mouth daily.   FLUoxetine 20 MG capsule Commonly known as:  PROZAC Take 40 mg by mouth 2 (two) times daily.   gabapentin 100 MG capsule Commonly known as:  NEURONTIN TAKE 1 CAPSULE BY MOUTH THREE TIMES A DAY What changed:  See the new instructions.   levothyroxine 75 MCG tablet Commonly known as:  SYNTHROID Take 75 mcg by mouth daily  before breakfast.   metoCLOPramide 10 MG tablet Commonly known as:  Reglan Take 1 tablet (10 mg total) by mouth every 6 (six) hours as needed for nausea (nausea/headache).   ondansetron 4 MG tablet Commonly known as:  ZOFRAN Take 1 tablet (4 mg total) by mouth every 8 (eight) hours as needed for nausea or vomiting.   oxyCODONE 5 MG immediate release tablet Commonly known as:  Oxy IR/ROXICODONE Take 1 pills every 4-6 hrs as needed for pain   pantoprazole 40 MG tablet Commonly known as:  PROTONIX Take 40 mg by mouth daily.   pilocarpine 5 MG tablet Commonly known as:  SALAGEN Take 2.5-5 mg by mouth daily as needed (dry mouth).   polyethylene glycol 17 g packet Commonly known as:  MIRALAX / GLYCOLAX Take 17 g by mouth daily as needed for mild constipation or moderate constipation.   rivaroxaban 10 MG Tabs tablet Commonly known as:  Xarelto Take 1 tablet (10 mg total) by mouth daily for 14 days.   senna-docusate 8.6-50 MG tablet Commonly known as:  Senokot-S Take 2 tablets by mouth 2 (two) times daily.   spironolactone 50 MG tablet Commonly known as:  ALDACTONE Take 1 tablet (50 mg total) by mouth daily before breakfast. What changed:    medication strength  how much to take   SUMAtriptan 100 MG tablet Commonly known as:  IMITREX MAY REPEAT IN 2 HOURS IF HEADACHE PERSISTS OR RECURS What changed:  See the new instructions.   topiramate 25 MG tablet Commonly known as:  TOPAMAX TAKE 2  TABLETS (50 MG TOTAL) BY MOUTH 2 (TWO) TIMES DAILY.   traZODone 100 MG tablet Commonly known as:  DESYREL Take 1 tablet (100 mg total) by mouth at bedtime. What changed:    medication strength  how much to take   valACYclovir 500 MG tablet Commonly known as:  VALTREX Take 500 mg by mouth daily.       Major procedures and Radiology Reports - PLEASE review detailed and final reports for all details, in brief -   Dg Chest 1 View  Result Date: 11/13/2018 CLINICAL DATA:  Pt  c/o proximal and central right lower leg pain after falling down steps in her home today. Hx of previous right knee surgery to repair ligaments. No chest complaints. No hx of heart or lung problems. Pt is a nonsmoker. Patient and tech wore masks. EXAM: CHEST  1 VIEW COMPARISON:  03/30/2017 FINDINGS: Numerous leads and wires project over the chest. Patient rotated left. Midline trachea. Mild cardiomegaly. Remote posterolateral left eighth rib fracture. No pleural effusion or pneumothorax. Clear lungs. IMPRESSION: No acute cardiopulmonary disease. Electronically Signed   By: Abigail Miyamoto M.D.   On: 11/13/2018 21:13   Dg Knee 2 Views Right  Result Date: 11/13/2018 CLINICAL DATA:  Initial encounter for trauma and pain. EXAM: RIGHT KNEE - 1-2 VIEW COMPARISON:  None. FINDINGS: AP and lateral views. Comminuted proximal tibia and fibular fractures. The proximal tibial fracture may extend to the joint surface, but is suboptimally evaluated. Oblique component extends approximately 8 cm distal to the tibial plateau. No significant knee joint effusion. There is pretibial soft tissue swelling. Ligament attachment site about the anterior tibia may indicate prior ACL repair. IMPRESSION: Comminuted proximal tibia and fibular fractures. Consider CT to evaluate for intra-articular extension at the tibial plateau. Electronically Signed   By: Abigail Miyamoto M.D.   On: 11/13/2018 21:16   Dg Tibia/fibula Right  Result Date: 11/13/2018 CLINICAL DATA:  Recent fall with knee pain, initial encounter EXAM: RIGHT TIBIA AND FIBULA - 2 VIEW COMPARISON:  None. FINDINGS: Comminuted proximal fibular fracture is noted as well as comminuted proximal tibial fracture. Mild impaction at the fracture site within the proximal tibia is noted. Postsurgical changes are noted in the proximal tibia as well. Midshaft comminuted fibular fracture is seen. No distal tibial or fibular fracture is noted IMPRESSION: Comminuted proximal and midshaft fibular  fractures with comminuted proximal tibial fracture with impaction at the fracture site. Electronically Signed   By: Inez Catalina M.D.   On: 11/13/2018 21:09   Dg Ankle Complete Right  Result Date: 11/14/2018 CLINICAL DATA:  Right ankle pain. Proximal tibia and fibula fractures. EXAM: RIGHT ANKLE - COMPLETE 3+ VIEW COMPARISON:  Radiographs dated 11/13/2018 FINDINGS: There is no evidence of ankle fracture, dislocation, or joint effusion. There is an incompletely visualized fracture of the midshaft right fibula. There is no evidence of arthropathy or other focal bone abnormality. Soft tissues are unremarkable. IMPRESSION: Normal right ankle. Right mid fibula fracture as described on prior exams. Electronically Signed   By: Lorriane Shire M.D.   On: 11/14/2018 10:16   Ct Cervical Spine Wo Contrast  Result Date: 11/13/2018 CLINICAL DATA:  Fall.  Pain. EXAM: CT CERVICAL SPINE WITHOUT CONTRAST TECHNIQUE: Multidetector CT imaging of the cervical spine was performed without intravenous contrast. Multiplanar CT image reconstructions were also generated. COMPARISON:  02/20/2018 FINDINGS: Alignment: Spinal visualization through the bottom of T2. Maintenance of vertebral body height and alignment across these levels. Skull base and vertebrae: Left  sided craniotomy with similar hyperattenuating left cerebellar rounded structure which could be postoperative. No acute fracture within the cervical spine identified. There is facet arthropathy bilaterally. C1-2 articulation within normal limits on coronal reformats Soft tissues and spinal canal: Prevertebral soft tissues are unremarkable. Disc levels: Loss of intervertebral disc height at multiple levels, most significant at C6-7. Upper chest: No apical pneumothorax. Other: None. IMPRESSION: Cervical spondylosis, without acute osseous abnormality. Electronically Signed   By: Abigail Miyamoto M.D.   On: 11/13/2018 20:59   Ct Knee Right Wo Contrast  Result Date:  11/13/2018 CLINICAL DATA:  Proximal tibial and fibular fractures, evaluate for possible articular involvement EXAM: CT OF THE RIGHT KNEE WITHOUT CONTRAST TECHNIQUE: Multidetector CT imaging of the right knee was performed according to the standard protocol. Multiplanar CT image reconstructions were also generated. COMPARISON:  None. FINDINGS: Bones/Joint/Cartilage Comminuted fracture of the proximal fibula is noted with mild impaction at the fracture site. Additionally significantly comminuted and impacted fracture of the proximal tibial metaphysis is noted. The fracture fragments extend into the proximal diaphysis as well. There is impaction of the posterolateral aspect of the tibia superiorly into the region of the tibial plateau although no definitive articular involvement is seen. Changes of prior ligament repair are noted with fixation staple in the anterior aspect of the tibia. Subchondral sclerosis is noted in the lateral tibial plateau as well as some mild degenerative changes of the knee joint. Distal femur is within normal limits. Midshaft fibular fracture is not evaluated on this exam. Ligaments Suboptimally assessed by CT.  Cruciate ligaments appear intact. Muscles and Tendons No specific muscular abnormality is noted. No significant muscular hematoma is seen. Soft tissues Changes are noted in the subcutaneous tissues consistent with edema. No significant joint effusion is noted. IMPRESSION: Comminuted proximal tibial and fibular fractures with impaction worse in the proximal tibia with extension of posterior fragments to just below the articular surface within posterior aspect of the tibial plateaus. No definitive intra-articular involvement is seen. Electronically Signed   By: Inez Catalina M.D.   On: 11/13/2018 22:27   Dg Knee Complete 4 Views Right  Result Date: 11/16/2018 CLINICAL DATA:  Open reduction and internal fixation of right tibial plateau fracture. EXAM: RIGHT KNEE - COMPLETE 4+ VIEW; DG  C-ARM 61-120 MIN Radiation exposure index: 5.59 mGy. COMPARISON:  Radiograph of November 14, 2018. FINDINGS: Five intraoperative fluoroscopic images were obtained of the right knee. These demonstrate surgical internal fixation of right tibial plateau fracture. Good alignment of fracture components is noted. IMPRESSION: Status post surgical internal fixation of right tibial plateau fracture. Electronically Signed   By: Marijo Conception M.D.   On: 11/16/2018 12:18   Dg Knee Complete 4 Views Right  Result Date: 11/14/2018 CLINICAL DATA:  Status post external fixation of right knee. EXAM: DG C-ARM 61-120 MIN; RIGHT KNEE - COMPLETE 4+ VIEW FLUOROSCOPY TIME:  17 seconds. COMPARISON:  Radiographs of November 13, 2018. FINDINGS: Four intraoperative fluoroscopic images of the right lower extremity demonstrate external fixation of mildly displaced and probably comminuted proximal tibial fracture. Fixation screws are seen in the right femur and right tibia. IMPRESSION: Status post external fixation of mildly displaced and probably comminuted proximal right tibial fracture. Electronically Signed   By: Marijo Conception M.D.   On: 11/14/2018 14:27   Dg Knee Right Port  Result Date: 11/16/2018 CLINICAL DATA:  Postop ORIF. EXAM: PORTABLE RIGHT KNEE - 1-2 VIEW COMPARISON:  11/16/2018 FINDINGS: Two-view show ORIF for tibial plateau fracture  with lateral plate and screws as well as a tibial plateau staple. Proximal fibular fracture as shown previously. IMPRESSION: No complication seen following ORIF for comminuted proximal tibial fracture Electronically Signed   By: Nelson Chimes M.D.   On: 11/16/2018 13:43   Dg Knee Right Port  Result Date: 11/14/2018 CLINICAL DATA:  Postoperative check. EXAM: PORTABLE RIGHT KNEE - 1-2 VIEW COMPARISON:  11/14/2018 FINDINGS: Interval placement of an external fixator device with a fixation screw in the mid/distal femur and screws in the mid tibia. Again noted is the comminuted displaced fracture  involving the proximal tibia and displaced fracture involving the proximal fibula. Again noted is surgical hardware in the anterior proximal tibia. IMPRESSION: Interval placement of an external fixator device in right lower extremity. Electronically Signed   By: Markus Daft M.D.   On: 11/14/2018 15:47   Dg C-arm 1-60 Min  Result Date: 11/16/2018 CLINICAL DATA:  Open reduction and internal fixation of right tibial plateau fracture. EXAM: RIGHT KNEE - COMPLETE 4+ VIEW; DG C-ARM 61-120 MIN Radiation exposure index: 5.59 mGy. COMPARISON:  Radiograph of November 14, 2018. FINDINGS: Five intraoperative fluoroscopic images were obtained of the right knee. These demonstrate surgical internal fixation of right tibial plateau fracture. Good alignment of fracture components is noted. IMPRESSION: Status post surgical internal fixation of right tibial plateau fracture. Electronically Signed   By: Marijo Conception M.D.   On: 11/16/2018 12:18   Dg C-arm 1-60 Min  Result Date: 11/14/2018 CLINICAL DATA:  Status post external fixation of right knee. EXAM: DG C-ARM 61-120 MIN; RIGHT KNEE - COMPLETE 4+ VIEW FLUOROSCOPY TIME:  17 seconds. COMPARISON:  Radiographs of November 13, 2018. FINDINGS: Four intraoperative fluoroscopic images of the right lower extremity demonstrate external fixation of mildly displaced and probably comminuted proximal tibial fracture. Fixation screws are seen in the right femur and right tibia. IMPRESSION: Status post external fixation of mildly displaced and probably comminuted proximal right tibial fracture. Electronically Signed   By: Marijo Conception M.D.   On: 11/14/2018 14:27    Micro Results   Recent Results (from the past 240 hour(s))  Surgical pcr screen     Status: None   Collection Time: 11/14/18 12:25 AM  Result Value Ref Range Status   MRSA, PCR NEGATIVE NEGATIVE Final   Staphylococcus aureus NEGATIVE NEGATIVE Final    Comment: (NOTE) The Xpert SA Assay (FDA approved for NASAL specimens  in patients 20 years of age and older), is one component of a comprehensive surveillance program. It is not intended to diagnose infection nor to guide or monitor treatment. Performed at [facility], [place] [address] [place], [place] [address]    Today   Subjective    Francys Bolin today has no new complaints, No chest pains no palpitations no dizziness , pt is eating and drinking well          Patient has been seen and examined prior to discharge   Objective   Blood pressure (!) 123/51, pulse 68, temperature 98.5 F (36.9 C), temperature source Oral, resp. rate 14, height 4\' 11"  (1.499 m), weight 49.2 kg, SpO2 94 %.   Intake/Output Summary (Last 24 hours) at 11/20/2018 1214 Last data filed at 11/20/2018 0900 Gross per 24 hour  Intake 720 ml  Output --  Net 720 ml    Exam Gen:- awake, alert, in no acute distress HEENT:- [place].AT, No sclera icterus Neck-Supple Neck,No JVD,.  Lungs-  CTAB , fair symmetrical air movement  CV- S1, S2 normal, regular  Abd-  +ve B.Sounds, Abd Soft, No tenderness,    Extremity/Skin:-   pedal pulses present, Right leg splint Device /dressing, good cap refill Psych-affect is appropriate, oriented x3 Neuro-no new focal deficits, no tremors   Data Review   CBC w Diff:  Lab Results  Component Value Date   WBC 10.5 11/19/2018   HGB 10.4 (L) 11/19/2018   HCT 30.9 (L) 11/19/2018   PLT 403 (H) 11/19/2018   LYMPHOPCT 10 11/13/2018   MONOPCT 6 11/13/2018   EOSPCT 1 11/13/2018   BASOPCT 0 11/13/2018    CMP:  Lab Results  Component Value Date   NA 135 11/18/2018   K 3.7 11/18/2018   CL 96 (L) 11/18/2018   CO2 28 11/18/2018   BUN 13 11/18/2018   CREATININE 0.86 11/18/2018   PROT 5.4 (L) 02/20/2018   ALBUMIN 3.3 (L) 02/20/2018   BILITOT 0.7 02/20/2018   ALKPHOS 58 02/20/2018   AST 14 (L) 02/20/2018   ALT 10 02/20/2018  .   Total Discharge time is about 33 minutes  Roxan Hockey M.D on 11/20/2018 at 12:14 PM  Go to  [email or web address] -  for contact info  [facility] - Office  [phone]

## 2018-11-20 NOTE — Progress Notes (Signed)
ORTHOPAEDIC PROGRESS NOTE  s/p Procedure(s): OPEN REDUCTION INTERNAL FIXATION (ORIF) TIBIAL PLATEAU R  SUBJECTIVE: NAD  OBJECTIVE: PE: RLE: brace in place, hinge in place, NVID, aquacel CDI  Vitals:   11/20/18 0431 11/20/18 0723  BP: (!) 80/54 (!) 123/51  Pulse: 75 68  Resp: 14   Temp: 98.4 F (36.9 C) 98.5 F (36.9 C)  SpO2: 96% 94%     ASSESSMENT: Caitlin Irwin is a 68 y.o. female doing well postoperatively.  PLAN: Weightbearing: TDWB RLE Insicional and dressing care: Dressings left intact until follow-up Orthopedic device(s): orders in to unlock brace, ok for ROM as tolerated Showering: prn, ok to take down ace wrap and shower but immedaitely put brace back on after VTE prophylaxis: Xarelto 10mg  x 6 weeks Pain control: prn meds Follow - up plan: about 2 weeks postop with me. Contact information:  Weekdays 8-5 Ophelia Charter MD [phone], After hours and holidays please check [email or web address] for group call information for [facility]

## 2018-11-20 NOTE — Progress Notes (Signed)
Physical Therapy Treatment Patient Details Name: Caitlin Irwin MRN: [id number] DOB: 1950-08-23 Today's Date: 11/20/2018    History of Present Illness Pt is a 68 y.o. female admitted 11/13/18 after fall sustaining R tibial plateau fx; now s/p R tib/fib ex fix 4/15. Returned to OR 4/17 for ORIF. PMH includes cataracts, IBS, anxiety, depression, chronic LBP.    PT Comments    Pt performed gait training for only 3 steps from bed side commode back to bed.  Once in standing patient become very anxious and requesting back to bed.  Assisted patient back to bed.  Pt continues to self limit due to anxiety and increase pain.  Post acute SNF placement remains appropriate.      Follow Up Recommendations  SNF     Equipment Recommendations  None recommended by PT    Recommendations for Other Services       Precautions / Restrictions Precautions Precautions: Fall Restrictions Weight Bearing Restrictions: Yes RLE Weight Bearing: Non weight bearing    Mobility  Bed Mobility Overal bed mobility: Needs Assistance Bed Mobility: Sit to Supine           General bed mobility comments: Assistance to lift RLE against gravity back to bed.    Transfers Overall transfer level: Needs assistance Equipment used: Rolling walker (2 wheeled) Transfers: Sit to/from Stand Sit to Stand: Min assist         General transfer comment: Pt required cues for hand placement to and from seated surface.  Increased pain with leg in dependent position  Ambulation/Gait Ambulation/Gait assistance: Min assist Gait Distance (Feet): (3 hop steps from [facility] to bed.  ) Assistive device: Rolling walker (2 wheeled) Gait Pattern/deviations: Step-to pattern     General Gait Details: hop to pattern, refused further gait due to pain.     Stairs             Wheelchair Mobility    Modified Rankin (Stroke Patients Only)       Balance Overall balance assessment: Needs assistance           Standing  balance-Leahy Scale: Poor                              Cognition Arousal/Alertness: Awake/alert Behavior During Therapy: Anxious Overall Cognitive Status: Within Functional Limits for tasks assessed                                 General Comments: anxious throughout session, requires cueing for redirection and deep breathing       Exercises      General Comments        Pertinent Vitals/Pain Pain Assessment: 0-10 Pain Score: 8  Pain Location: RLE Pain Descriptors / Indicators: Throbbing;Grimacing;Guarding;Crying Pain Intervention(s): Monitored during session;Repositioned    Home Living                      Prior Function            PT Goals (current goals can now be found in the care plan section) Acute Rehab PT Goals Patient Stated Goal: Be able to care for self again Potential to Achieve Goals: Fair Progress towards PT goals: Progressing toward goals    Frequency    Min 3X/week      PT Plan Current plan remains appropriate    Co-evaluation  AM-PAC PT "6 Clicks" Mobility   Outcome Measure  Help needed turning from your back to your side while in a flat bed without using bedrails?: A Lot Help needed moving from lying on your back to sitting on the side of a flat bed without using bedrails?: A Lot Help needed moving to and from a bed to a chair (including a wheelchair)?: A Lot Help needed standing up from a chair using your arms (e.g., wheelchair or bedside chair)?: A Lot Help needed to walk in hospital room?: Total Help needed climbing 3-5 steps with a railing? : Total 6 Click Score: 10    End of Session Equipment Utilized During Treatment: Gait belt Activity Tolerance: Patient limited by pain Patient left: in chair;with call bell/phone within reach;with chair alarm set Nurse Communication: Mobility status PT Visit Diagnosis: Other abnormalities of gait and mobility (R26.89);Pain Pain - Right/Left:  Right Pain - part of body: Leg     Time: 1020-1035 PT Time Calculation (min) (ACUTE ONLY): 15 min  Charges:  $Therapeutic Activity: 8-22 mins                     Governor Rooks, PTA Acute Rehabilitation Services Pager [phone] Office [phone]     Caitlin Irwin Eli Hose 11/20/2018, 5:57 PM

## 2018-11-20 NOTE — TOC Transition Note (Signed)
Transition of Care [facility]) - CM/SW Discharge Note   Patient Details  Name: Caitlin Irwin MRN: [id number] Date of Birth: [birth year]  [facility] CM/SW Contact:  [name], [place] Phone Number: 11/20/2018, 11:44 AM   Clinical Narrative:    Pt has bed and authorization for SNF placement at [facility]. Scripts and PTAR papers on chart, await dc summary.   Final next level of care: Skilled Nursing Facility Barriers to Discharge: Barriers Resolved   Patient Goals and CMS Choice Patient states their goals for this hospitalization and ongoing recovery are:: to get stronger  CMS Medicare.gov Compare Post Acute Care list provided to:: Patient Choice offered to / list presented to : Patient  Discharge Placement   Existing PASRR number confirmed : 11/16/18          Patient chooses bed at: [facility] Patient to be transferred to facility by: [name] Name of family member notified: pt will inform family Patient and family notified of of transfer: 11/20/18  Discharge Plan and Services   Discharge Planning Services: CM Consult               Social Determinants of Health ([place]) Interventions     Readmission Risk Interventions No flowsheet data found.

## 2018-11-21 DIAGNOSIS — D62 Acute posthemorrhagic anemia: Secondary | ICD-10-CM | POA: Diagnosis not present

## 2018-11-21 DIAGNOSIS — G43909 Migraine, unspecified, not intractable, without status migrainosus: Secondary | ICD-10-CM | POA: Diagnosis not present

## 2018-11-21 DIAGNOSIS — G500 Trigeminal neuralgia: Secondary | ICD-10-CM | POA: Diagnosis not present

## 2018-11-21 DIAGNOSIS — S82201G Unspecified fracture of shaft of right tibia, subsequent encounter for closed fracture with delayed healing: Secondary | ICD-10-CM | POA: Diagnosis not present

## 2018-11-21 DIAGNOSIS — K279 Peptic ulcer, site unspecified, unspecified as acute or chronic, without hemorrhage or perforation: Secondary | ICD-10-CM | POA: Diagnosis not present

## 2018-11-21 DIAGNOSIS — E7849 Other hyperlipidemia: Secondary | ICD-10-CM | POA: Diagnosis not present

## 2018-11-22 DIAGNOSIS — K5901 Slow transit constipation: Secondary | ICD-10-CM | POA: Diagnosis not present

## 2018-11-22 DIAGNOSIS — S82201A Unspecified fracture of shaft of right tibia, initial encounter for closed fracture: Secondary | ICD-10-CM | POA: Diagnosis not present

## 2018-11-22 DIAGNOSIS — S82401A Unspecified fracture of shaft of right fibula, initial encounter for closed fracture: Secondary | ICD-10-CM | POA: Diagnosis not present

## 2018-11-22 DIAGNOSIS — T849XXA Unspecified complication of internal orthopedic prosthetic device, implant and graft, initial encounter: Secondary | ICD-10-CM | POA: Diagnosis not present

## 2018-11-27 NOTE — Anesthesia Postprocedure Evaluation (Signed)
Anesthesia Post Note  Patient: Caitlin Irwin  Procedure(s) Performed: OPEN REDUCTION INTERNAL FIXATION (ORIF) TIBIAL PLATEAU (Right Leg Upper)     Patient location during evaluation: PACU Anesthesia Type: General Level of consciousness: sedated and patient cooperative Pain management: pain level controlled Vital Signs Assessment: post-procedure vital signs reviewed and stable Respiratory status: spontaneous breathing Cardiovascular status: stable Anesthetic complications: no    Last Vitals:  Vitals:   11/20/18 0723 11/20/18 1312  BP: (!) 123/51 (!) 107/58  Pulse: 68 64  Resp:  16  Temp: 36.9 C 36.9 C  SpO2: 94% 97%    Last Pain:  Vitals:   11/20/18 1312  TempSrc: Oral  PainSc:                  Nolon Nations

## 2018-11-29 DIAGNOSIS — S82201G Unspecified fracture of shaft of right tibia, subsequent encounter for closed fracture with delayed healing: Secondary | ICD-10-CM | POA: Diagnosis not present

## 2018-11-29 DIAGNOSIS — K5901 Slow transit constipation: Secondary | ICD-10-CM | POA: Diagnosis not present

## 2018-11-29 DIAGNOSIS — S82401G Unspecified fracture of shaft of right fibula, subsequent encounter for closed fracture with delayed healing: Secondary | ICD-10-CM | POA: Diagnosis not present

## 2018-12-05 DIAGNOSIS — F39 Unspecified mood [affective] disorder: Secondary | ICD-10-CM | POA: Diagnosis not present

## 2018-12-05 DIAGNOSIS — S82201G Unspecified fracture of shaft of right tibia, subsequent encounter for closed fracture with delayed healing: Secondary | ICD-10-CM | POA: Diagnosis not present

## 2018-12-05 DIAGNOSIS — E7849 Other hyperlipidemia: Secondary | ICD-10-CM | POA: Diagnosis not present

## 2018-12-05 DIAGNOSIS — G500 Trigeminal neuralgia: Secondary | ICD-10-CM | POA: Diagnosis not present

## 2018-12-05 DIAGNOSIS — I10 Essential (primary) hypertension: Secondary | ICD-10-CM | POA: Diagnosis not present

## 2018-12-05 DIAGNOSIS — S82201 Unspecified fracture of shaft of right tibia: Secondary | ICD-10-CM | POA: Diagnosis not present

## 2018-12-05 DIAGNOSIS — K279 Peptic ulcer, site unspecified, unspecified as acute or chronic, without hemorrhage or perforation: Secondary | ICD-10-CM | POA: Diagnosis not present

## 2018-12-05 DIAGNOSIS — D490 Neoplasm of unspecified behavior of digestive system: Secondary | ICD-10-CM | POA: Diagnosis not present

## 2018-12-07 DIAGNOSIS — S82141D Displaced bicondylar fracture of right tibia, subsequent encounter for closed fracture with routine healing: Secondary | ICD-10-CM | POA: Diagnosis not present

## 2018-12-11 DIAGNOSIS — S82491S Other fracture of shaft of right fibula, sequela: Secondary | ICD-10-CM | POA: Diagnosis not present

## 2018-12-11 DIAGNOSIS — M6281 Muscle weakness (generalized): Secondary | ICD-10-CM | POA: Diagnosis not present

## 2018-12-11 DIAGNOSIS — S82191S Other fracture of upper end of right tibia, sequela: Secondary | ICD-10-CM | POA: Diagnosis not present

## 2018-12-11 DIAGNOSIS — Z4789 Encounter for other orthopedic aftercare: Secondary | ICD-10-CM | POA: Diagnosis not present

## 2018-12-19 DIAGNOSIS — R2681 Unsteadiness on feet: Secondary | ICD-10-CM | POA: Diagnosis not present

## 2018-12-19 DIAGNOSIS — M6281 Muscle weakness (generalized): Secondary | ICD-10-CM | POA: Diagnosis not present

## 2018-12-19 DIAGNOSIS — S82201A Unspecified fracture of shaft of right tibia, initial encounter for closed fracture: Secondary | ICD-10-CM | POA: Diagnosis not present

## 2019-01-08 DIAGNOSIS — S82141D Displaced bicondylar fracture of right tibia, subsequent encounter for closed fracture with routine healing: Secondary | ICD-10-CM | POA: Diagnosis not present

## 2019-01-08 DIAGNOSIS — E559 Vitamin D deficiency, unspecified: Secondary | ICD-10-CM | POA: Diagnosis not present

## 2019-01-08 DIAGNOSIS — M810 Age-related osteoporosis without current pathological fracture: Secondary | ICD-10-CM | POA: Diagnosis not present

## 2019-01-08 DIAGNOSIS — R5383 Other fatigue: Secondary | ICD-10-CM | POA: Diagnosis not present

## 2019-01-09 ENCOUNTER — Other Ambulatory Visit: Payer: Self-pay

## 2019-01-09 ENCOUNTER — Encounter (HOSPITAL_COMMUNITY): Payer: Self-pay

## 2019-01-09 ENCOUNTER — Emergency Department (HOSPITAL_COMMUNITY)
Admission: EM | Admit: 2019-01-09 | Discharge: 2019-01-10 | Disposition: A | Discharge status: Home / Self Care | Payer: PPO | Attending: Emergency Medicine | Admitting: Emergency Medicine

## 2019-01-09 ENCOUNTER — Emergency Department (HOSPITAL_COMMUNITY): Payer: PPO

## 2019-01-09 DIAGNOSIS — E876 Hypokalemia: Secondary | ICD-10-CM | POA: Diagnosis not present

## 2019-01-09 DIAGNOSIS — R109 Unspecified abdominal pain: Secondary | ICD-10-CM | POA: Diagnosis not present

## 2019-01-09 DIAGNOSIS — R52 Pain, unspecified: Secondary | ICD-10-CM | POA: Diagnosis not present

## 2019-01-09 DIAGNOSIS — R5381 Other malaise: Secondary | ICD-10-CM | POA: Diagnosis not present

## 2019-01-09 DIAGNOSIS — E039 Hypothyroidism, unspecified: Secondary | ICD-10-CM | POA: Diagnosis not present

## 2019-01-09 DIAGNOSIS — N200 Calculus of kidney: Secondary | ICD-10-CM | POA: Diagnosis not present

## 2019-01-09 DIAGNOSIS — N132 Hydronephrosis with renal and ureteral calculous obstruction: Secondary | ICD-10-CM | POA: Diagnosis not present

## 2019-01-09 DIAGNOSIS — R079 Chest pain, unspecified: Secondary | ICD-10-CM | POA: Diagnosis not present

## 2019-01-09 DIAGNOSIS — R1011 Right upper quadrant pain: Secondary | ICD-10-CM | POA: Diagnosis present

## 2019-01-09 DIAGNOSIS — Z79899 Other long term (current) drug therapy: Secondary | ICD-10-CM | POA: Diagnosis not present

## 2019-01-09 DIAGNOSIS — N201 Calculus of ureter: Secondary | ICD-10-CM | POA: Insufficient documentation

## 2019-01-09 DIAGNOSIS — R112 Nausea with vomiting, unspecified: Secondary | ICD-10-CM | POA: Diagnosis not present

## 2019-01-09 LAB — CBC WITH DIFFERENTIAL/PLATELET
Abs Immature Granulocytes: 0.07 10*3/uL (ref 0.00–0.07)
Basophils Absolute: 0 10*3/uL (ref 0.0–0.1)
Basophils Relative: 0 %
Eosinophils Absolute: 0.1 10*3/uL (ref 0.0–0.5)
Eosinophils Relative: 1 %
HCT: 40.7 % (ref 36.0–46.0)
Hemoglobin: 12.7 g/dL (ref 12.0–15.0)
Immature Granulocytes: 1 %
Lymphocytes Relative: 9 %
Lymphs Abs: 1.3 10*3/uL (ref 0.7–4.0)
MCH: 28.2 pg (ref 26.0–34.0)
MCHC: 31.2 g/dL (ref 30.0–36.0)
MCV: 90.2 fL (ref 80.0–100.0)
Monocytes Absolute: 1.1 10*3/uL — ABNORMAL HIGH (ref 0.1–1.0)
Monocytes Relative: 7 %
Neutro Abs: 12 10*3/uL — ABNORMAL HIGH (ref 1.7–7.7)
Neutrophils Relative %: 82 %
Platelets: 330 10*3/uL (ref 150–400)
RBC: 4.51 MIL/uL (ref 3.87–5.11)
RDW: 14.2 % (ref 11.5–15.5)
WBC: 14.6 10*3/uL — ABNORMAL HIGH (ref 4.0–10.5)
nRBC: 0 % (ref 0.0–0.2)

## 2019-01-09 LAB — COMPREHENSIVE METABOLIC PANEL
ALT: 30 U/L (ref 0–44)
AST: 21 U/L (ref 15–41)
Albumin: 3.8 g/dL (ref 3.5–5.0)
Alkaline Phosphatase: 105 U/L (ref 38–126)
Anion gap: 10 (ref 5–15)
BUN: 11 mg/dL (ref 8–23)
CO2: 21 mmol/L — ABNORMAL LOW (ref 22–32)
Calcium: 9.7 mg/dL (ref 8.9–10.3)
Chloride: 106 mmol/L (ref 98–111)
Creatinine, Ser: 1 mg/dL (ref 0.44–1.00)
GFR calc Af Amer: >60 mL/min (ref >60)
GFR calc non Af Amer: 57 mL/min — ABNORMAL LOW (ref >60)
Glucose, Bld: 122 mg/dL — ABNORMAL HIGH (ref 70–99)
Potassium: 3 mmol/L — ABNORMAL LOW (ref 3.5–5.1)
Sodium: 137 mmol/L (ref 135–145)
Total Bilirubin: 0.3 mg/dL (ref 0.3–1.2)
Total Protein: 7.1 g/dL (ref 6.5–8.1)

## 2019-01-09 LAB — TROPONIN I: Troponin I: <0.03 ng/mL (ref <0.03)

## 2019-01-09 LAB — LIPASE, BLOOD: Lipase: 35 U/L (ref 11–51)

## 2019-01-09 MED ORDER — MORPHINE SULFATE (PF) 4 MG/ML IV SOLN
4.0000 mg | Freq: Once | INTRAVENOUS | Status: AC
  Administered 2019-01-09: 4 mg via INTRAVENOUS
  Filled 2019-01-09: qty 1

## 2019-01-09 MED ORDER — DIPHENHYDRAMINE HCL 50 MG/ML IJ SOLN
12.5000 mg | Freq: Once | INTRAMUSCULAR | Status: AC
  Administered 2019-01-09: 12.5 mg via INTRAVENOUS
  Filled 2019-01-09: qty 1

## 2019-01-09 MED ORDER — ONDANSETRON HCL 4 MG/2ML IJ SOLN
4.0000 mg | Freq: Once | INTRAMUSCULAR | Status: AC
  Administered 2019-01-09: 4 mg via INTRAVENOUS
  Filled 2019-01-09: qty 2

## 2019-01-09 MED ORDER — SODIUM CHLORIDE 0.9 % IV BOLUS
500.0000 mL | Freq: Once | INTRAVENOUS | Status: AC
  Administered 2019-01-09: 500 mL via INTRAVENOUS

## 2019-01-09 MED ORDER — FAMOTIDINE IN NACL 20-0.9 MG/50ML-% IV SOLN
20.0000 mg | Freq: Once | INTRAVENOUS | Status: AC
  Administered 2019-01-09: 20 mg via INTRAVENOUS
  Filled 2019-01-09: qty 50

## 2019-01-09 NOTE — ED Triage Notes (Signed)
Per EMS, PT is coming from [facility]. Pt complaining of abd pain in the Right Upper quadrant, radiates to back. Also has N/V. Denies diarrhea. Has been going on apprx for the last two hours. Last set of vitals BP:140/70, P:76, Resp:16, O2:97%, Temp:98.4, Pain:8/10

## 2019-01-09 NOTE — ED Provider Notes (Signed)
[facility] DEPT Provider Note   CSN: [id number] Arrival date & time: 01/09/19  2200    History   Chief Complaint Chief Complaint  Patient presents with   Abdominal Pain   Emesis    HPI Caitlin Irwin is a 68 y.o. female.     HPI  Pt is a 68 y/o female with a h/o anemia, GERD, HTN, HLD, IBS, migraines, osteoporosis, PUD, cholecystectomy, who presents to the ED today c/o 9/10 RUQ abd pain that began earlier tonight right after dinner. Pain feels like a stabbing pain. Radiates somewhat to the right back. Pain has been constant since onset. Denies associated vomiting, but reports associated vomiting. Denies sweats, chills. Denies any constipation, diarrhea, or urinary complaints.  States she feels SOB secondary to pain. No chest pain. No cough. No known COVID exposures.  Past Medical History:  Diagnosis Date   Accidental poisoning by aspirin 09/2014   Anemia    Anxiety    Arthritis    "right knee; right shoulder" (09/15/2015)   Ataxia 03/24/2014   Cataract    both eyes   Chronic lower back pain    scoliosis   Depression    GERD (gastroesophageal reflux disease)    H/O hiatal hernia    High cholesterol    History of blood transfusion    "related to OR"   Hypersomnia    IBS (irritable bowel syndrome)    Migraines    "maybe once/week now" (09/15/2015)   Osteoporosis    Other constipation    Parotid tumor    PONV (postoperative nausea and vomiting)    hx of " getting too much" - 1998, slow to take up by body    PUD (peptic ulcer disease)    Trigeminal neuralgia    S/P radiation therapy    Unspecified hypothyroidism     Patient Active Problem List   Diagnosis Date Noted   Closed fracture of proximal end of right tibia and fibula 11/13/2018   Epigastric hernia 09/15/2015   Migraine, intractable 05/28/2015   Migraine 05/28/2015   Headache, migraine    Insomnia    Emesis    History of hypothyroidism     Microcytic hypochromic anemia    Salicylate poisoning 08/65/7846   Migraine headache 08/28/2014   Hypokalemia 96/29/5284   Salicylate intoxication 08/28/2014   Aspirin toxicity    Ataxia 13/24/4010   Complicated grief 27/25/3664   Altered mental status, unspecified altered mental status type 10/13/2013   Polypharmacy 10/13/2013   Depression 10/13/2013   Atypical face pain 02/10/2013   Esophageal reflux 06/12/2012   Trigeminal neuralgia 11/07/2011   Hx of migraine headaches 10/20/2011   Dysphagia, unspecified(787.20) 09/26/2011   Weight gain 09/26/2011   NAUSEA 02/02/2010   DIARRHEA 02/02/2010   CHANGE IN BOWELS 02/02/2010   ULCER-DUODENAL 11/26/2008   History of peptic ulcer 11/26/2008   GASTRITIS 11/26/2008   HYPOTHYROIDISM 10/03/2007   CONSTIPATION, CHRONIC 10/03/2007   IRRITABLE BOWEL SYNDROME 10/03/2007   COLITIS, HX OF 10/03/2007    Past Surgical History:  Procedure Laterality Date   ABDOMINAL HYSTERECTOMY     ANKLE FRACTURE SURGERY Left    ANTERIOR LUMBAR FUSION  04/2010   L4-5; L5-S1   BACK SURGERY     BRAIN SURGERY     parotid tumor removed    CHOLECYSTECTOMY OPEN     EPIGASTRIC HERNIA REPAIR  09/15/2015   Procedure: OPEN HERNIA REPAIR EPIGASTRIC ADULT;  Surgeon: Greer Pickerel, MD;  Location: [facility];  Service: General;;   ESOPHAGOGASTRODUODENOSCOPY  06/12/2012   Procedure: ESOPHAGOGASTRODUODENOSCOPY (EGD);  Surgeon: Lear Ng, MD;  Location: [facility];  Service: Endoscopy;  Laterality: N/A;   EXTERNAL FIXATION LEG Right 11/14/2018   Procedure: EXTERNAL FIXATION LEG;  Surgeon: Hiram Gash, MD;  Location: [facility];  Service: Orthopedics;  Laterality: Right;   FRACTURE SURGERY     gamma knife for trig neuralgia  12/27/11   HERNIA REPAIR     KNEE CARTILAGE SURGERY Right 1981   "converted from scope to open during the OR; ligament repair"   LAMINECTOMY AND MICRODISCECTOMY LUMBAR SPINE  1980   L4-5   LAPAROSCOPIC  EPIGASTRIC HERNIA REPAIR  09/15/2015   open   ORIF TIBIA PLATEAU Right 11/16/2018   Procedure: OPEN REDUCTION INTERNAL FIXATION (ORIF) TIBIAL PLATEAU;  Surgeon: Hiram Gash, MD;  Location: [facility];  Service: Orthopedics;  Laterality: Right;   PAROTID GLAND TUMOR EXCISION     benign   SHOULDER ARTHROSCOPY W/ ROTATOR CUFF REPAIR Right 11-2014   TRIGEMINAL NERVE DECOMPRESSION     to stop migraines and it did not work      OB History   No obstetric history on file.      Home Medications    Prior to Admission medications   Medication Sig Start Date End Date Taking? Authorizing Provider  ALPRAZolam Duanne Moron) 0.5 MG tablet Take 1 tablet (0.5 mg total) by mouth 2 (two) times daily as needed for anxiety. 11/20/18  Yes Emokpae, Courage, MD  estradiol (ESTRACE) 1 MG tablet Take 1 mg by mouth daily.  04/21/13  Yes [provider]  FLUoxetine (PROZAC) 20 MG capsule Take 40 mg by mouth 2 (two) times daily.  04/04/17  Yes [provider]  gabapentin (NEURONTIN) 300 MG capsule Take 300 mg by mouth every morning.   Yes [provider]  levothyroxine (SYNTHROID, LEVOTHROID) 75 MCG tablet Take 75 mcg by mouth daily before breakfast.  04/09/17  Yes [provider]  oxyCODONE (OXY IR/ROXICODONE) 5 MG immediate release tablet Take 1 pills every 4-6 hrs as needed for pain 11/20/18  Yes Emokpae, Courage, MD  pantoprazole (PROTONIX) 40 MG tablet Take 40 mg by mouth daily.  02/18/12  Yes [provider]  pilocarpine (SALAGEN) 5 MG tablet Take 5 mg by mouth daily.  02/17/15  Yes [provider]  polyethylene glycol (MIRALAX / GLYCOLAX) 17 g packet Take 17 g by mouth daily as needed for mild constipation or moderate constipation. 11/20/18  Yes Emokpae, Courage, MD  senna-docusate (SENOKOT-S) 8.6-50 MG tablet Take 2 tablets by mouth 2 (two) times daily. 11/20/18  Yes Roxan Hockey, MD  spironolactone (ALDACTONE) 50 MG tablet Take 1 tablet (50 mg total) by mouth daily  before breakfast. 11/20/18  Yes Emokpae, Courage, MD  SUMAtriptan (IMITREX) 100 MG tablet MAY REPEAT IN 2 HOURS IF HEADACHE PERSISTS OR RECURS Patient taking differently: Take 100 mg by mouth See admin instructions. Daily as needed for migraine.  May repeat in 2 hours if headache persists. 04/30/18  Yes Dohmeier, Asencion Partridge, MD  topiramate (TOPAMAX) 50 MG tablet Take 50 mg by mouth 2 (two) times daily.   Yes [provider]  traMADol (ULTRAM) 50 MG tablet Take 25 mg by mouth every 8 (eight) hours as needed for moderate pain.   Yes [provider]  traZODone (DESYREL) 100 MG tablet Take 1 tablet (100 mg total) by mouth at bedtime. 11/20/18  Yes Roxan Hockey, MD  valACYclovir (VALTREX) 500 MG tablet  Take 500 mg by mouth daily.   Yes [provider]  cephALEXin (KEFLEX) 500 MG capsule Take 1 capsule (500 mg total) by mouth 2 (two) times daily for 7 days. 01/10/19 01/17/19  Zhoey Blackstock S, PA-C  gabapentin (NEURONTIN) 100 MG capsule TAKE 1 CAPSULE BY MOUTH THREE TIMES A DAY Patient not taking: No sig reported 12/22/17   Dohmeier, Asencion Partridge, MD  HYDROcodone-acetaminophen (NORCO/VICODIN) 5-325 MG tablet Take 1 tablet by mouth every 8 (eight) hours as needed. 01/10/19   Admire Bunnell S, PA-C  metoCLOPramide (REGLAN) 10 MG tablet Take 1 tablet (10 mg total) by mouth every 6 (six) hours as needed for nausea (nausea/headache). Patient not taking: Reported on 01/09/2019 02/20/18   Julianne Rice, MD  ondansetron (ZOFRAN) 4 MG tablet Take 1 tablet (4 mg total) by mouth every 8 (eight) hours as needed for nausea or vomiting. 01/10/19   Lilygrace Rodick S, PA-C  potassium chloride (K-DUR) 10 MEQ tablet Take 1 tablet (10 mEq total) by mouth daily for 5 days. 01/10/19 01/15/19  Andrus Sharp S, PA-C  rivaroxaban (XARELTO) 10 MG TABS tablet Take 1 tablet (10 mg total) by mouth daily for 14 days. Patient not taking: Reported on 01/09/2019 11/16/18 11/30/18  Hiram Gash, MD  tamsulosin (FLOMAX) 0.4  MG CAPS capsule Take 1 capsule (0.4 mg total) by mouth daily. 01/10/19   Derold Dorsch S, PA-C  topiramate (TOPAMAX) 25 MG tablet TAKE 2 TABLETS (50 MG TOTAL) BY MOUTH 2 (TWO) TIMES DAILY. Patient not taking: TAKE 2 TABLETS (50 MG TOTAL) BY MOUTH 2 (TWO) TIMES DAILY. 12/15/17   Dohmeier, Asencion Partridge, MD  eletriptan (RELPAX) 40 MG tablet One tablet by mouth as needed for migraine headache.  If the headache improves and then returns, dose may be repeated after 2 hours have elapsed since first dose (do not exceed 80 mg per day). may repeat in 2 hours if necessary  10/20/11  [provider]  ferrous sulfate 325 (65 FE) MG tablet Take 325 mg by mouth daily with breakfast.  10/20/11  [provider]    Family History Family History  Problem Relation Age of Onset   Ovarian cancer Mother    Migraines Mother    Diabetes Father    Migraines Brother    Heart disease Other    Heart disease Maternal Grandfather     Social History Social History   Tobacco Use   Smoking status: Never Smoker   Smokeless tobacco: Never Used  Substance Use Topics   Alcohol use: No   Drug use: No     Allergies   Acetaminophen   Review of Systems Review of Systems  Constitutional: Negative for chills and fever.  HENT: Negative for ear pain and sore throat.   Eyes: Negative for visual disturbance.  Respiratory: Positive for shortness of breath. Negative for cough.   Cardiovascular: Negative for chest pain.  Gastrointestinal: Positive for abdominal pain and nausea. Negative for constipation, diarrhea and vomiting.  Genitourinary: Negative for dysuria and hematuria.  Musculoskeletal: Negative for back pain.  Skin: Negative for rash.  Neurological: Negative for headaches.  All other systems reviewed and are negative.    Physical Exam Updated Vital Signs BP 125/61    Pulse 66    Temp 98.9 F (37.2 C)    Resp (!) 21    Ht 4\' 11"  (1.499 m)    Wt 45.4 kg    SpO2 98%    BMI 20.20 kg/m    Physical Exam Vitals  signs and nursing note reviewed.  Constitutional:      General: She is not in acute distress.    Appearance: She is well-developed. She is not ill-appearing or toxic-appearing.  HENT:     Head: Normocephalic and atraumatic.  Eyes:     Conjunctiva/sclera: Conjunctivae normal.  Neck:     Musculoskeletal: Neck supple.  Cardiovascular:     Rate and Rhythm: Normal rate and regular rhythm.     Heart sounds: Normal heart sounds. No murmur.  Pulmonary:     Effort: Pulmonary effort is normal. No respiratory distress.     Breath sounds: Normal breath sounds. No wheezing, rhonchi or rales.  Abdominal:     Palpations: Abdomen is soft.     Tenderness: There is abdominal tenderness in the right upper quadrant. There is right CVA tenderness. There is no left CVA tenderness.  Skin:    General: Skin is warm and dry.  Neurological:     Mental Status: She is alert.      ED Treatments / Results  Labs (all labs ordered are listed, but only abnormal results are displayed) Labs Reviewed  CBC WITH DIFFERENTIAL/PLATELET - Abnormal; Notable for the following components:      Result Value   WBC 14.6 (*)    Neutro Abs 12.0 (*)    Monocytes Absolute 1.1 (*)    All other components within normal limits  COMPREHENSIVE METABOLIC PANEL - Abnormal; Notable for the following components:   Potassium 3.0 (*)    CO2 21 (*)    Glucose, Bld 122 (*)    GFR calc non Af Amer 57 (*)    All other components within normal limits  URINALYSIS, ROUTINE W REFLEX MICROSCOPIC - Abnormal; Notable for the following components:   APPearance CLOUDY (*)    Hgb urine dipstick MODERATE (*)    Leukocytes,Ua MODERATE (*)    All other components within normal limits  URINE CULTURE  LIPASE, BLOOD  TROPONIN I    EKG EKG Interpretation  Date/Time:  Wednesday January 09 2019 23:04:28 EDT Ventricular Rate:  71 PR Interval:    QRS Duration: 101 QT Interval:  427 QTC Calculation: 464 R  Axis:   -40 Text Interpretation:  Sinus rhythm Abnormal R-wave progression, late transition LVH with secondary repolarization abnormality Artifact Confirmed by Shanon Rosser [phone]) on 01/09/2019 11:19:38 PM   Radiology Ct Abdomen Pelvis W Contrast  Result Date: 01/10/2019 CLINICAL DATA:  68 year old female with right upper quadrant abdominal pain, nausea. EXAM: CT ABDOMEN AND PELVIS WITH CONTRAST TECHNIQUE: Multidetector CT imaging of the abdomen and pelvis was performed using the standard protocol following bolus administration of intravenous contrast. CONTRAST:  80 milliliters Omnipaque 300 COMPARISON:  CT Abdomen and Pelvis 11/12/2015. FINDINGS: Lower chest: Negative. Hepatobiliary: Surgically absent gallbladder. Chronic but increased intra and extrahepatic biliary ductal dilatation. Stable liver enhancement. Pancreas: Negative, main pancreatic duct within normal limits. Spleen: Negative. Adrenals/Urinary Tract: Normal adrenal glands. The left kidney appears stable and normal. Delayed right nephrogram with hydronephrosis and absent right renal contrast excretion on the delayed images. There is right hydroureter continuing to the right ureterovesical junction where a 6 x 7 millimeter obstructing calculus is demonstrated on series 2, image 60. The urinary bladder is diminutive and otherwise unremarkable. No other urinary calculus identified. Stomach/Bowel: Decompressed rectum. Redundant, decompressed sigmoid colon. The descending colon is also decompressed. Redundant transverse colon containing gas. Mild retained stool in the right colon. No dilated small bowel. No mesenteric stranding. Decompressed stomach. No free air  or free fluid. Vascular/Lymphatic: Major arterial structures are patent. Mild Aortoiliac calcified atherosclerosis. Portal venous system is patent. No lymphadenopathy. Reproductive: Surgically absent. Other: No pelvic free fluid. Musculoskeletal: Scoliosis. Chronic lower lumbar anterior  fusion. No acute osseous abnormality identified. IMPRESSION: 1. Acute obstructive uropathy on the right with a 6 x 7 mm calculus at the right UVJ. 2. Chronic but increased intra and extrahepatic biliary ductal size. If there is acute hyperbilirubinemia, consider follow-up MRCP/ERCP. 3. No other acute findings in the abdomen or pelvis. Electronically Signed   By: Genevie Ann M.D.   On: 01/10/2019 02:22   Dg Chest Portable 1 View  Result Date: 01/09/2019 CLINICAL DATA:  68 year old female with chest and abdominal pain. EXAM: PORTABLE CHEST 1 VIEW COMPARISON:  Portable chest 11/13/2018 and earlier. FINDINGS: Portable AP semi upright view at 2229 hours. Interatrial occlusion device redemonstrated. Cardiac and mediastinal contours are stable and within normal limits. Stable lung volumes. Mild elevation of the right hemidiaphragm. Allowing for portable technique the lungs are clear. No pneumothorax. Paucity of bowel gas in the upper abdomen. No acute osseous abnormality identified. IMPRESSION: No acute cardiopulmonary abnormality. Electronically Signed   By: Genevie Ann M.D.   On: 01/09/2019 23:07    Procedures Procedures (including critical care time)  Medications Ordered in ED Medications  sodium chloride (PF) 0.9 % injection (has no administration in time range)  ondansetron (ZOFRAN) injection 4 mg (4 mg Intravenous Given 01/09/19 2311)  morphine 4 MG/ML injection 4 mg (4 mg Intravenous Given 01/09/19 2312)  famotidine (PEPCID) IVPB 20 mg premix (0 mg Intravenous Stopped 01/10/19 0038)  sodium chloride 0.9 % bolus 500 mL (0 mLs Intravenous Stopped 01/10/19 0039)  diphenhydrAMINE (BENADRYL) injection 12.5 mg (12.5 mg Intravenous Given 01/09/19 2329)     Initial Impression / Assessment and Plan / ED Course  I have reviewed the triage vital signs and the nursing notes.  Pertinent labs & imaging results that were available during my care of the patient were reviewed by me and considered in my medical decision  making (see chart for details).    Final Clinical Impressions(s) / ED Diagnoses   Final diagnoses:  Kidney stone  Hypokalemia   68 year old female presenting the emergency department today complaining of right upper quadrant and right flank pain starting earlier today.  Associated with nausea but no vomiting or fevers.  On initial evaluation, vital signs are reassuring.  No fevers.  Blood pressure normal and no tachycardia.  Does have right upper quadrant tenderness and right CVA tenderness on exam.  No peritoneal signs.  CBC shows a mild leukocytosis of 14.6. CMP with mild hypokalemia (will give supplementation and advise f/u), normal liver and kidney function.   Lipase is negative Troponin negative UA with moderate hematuria, moderate leukocytes, 21-50 RBCs, 21-50 WBCs and no bacteria.  6-10 squamous epithelial cells.  Do not think that this represents an infected stone however will cover with antibiotics and culture the urine.  EKG  Sinus rhythm Abnormal R-wave progression, late transition LVH with secondary repolarization abnormality Artifact   CXR No acute cardiopulmonary abnormality.   CT abd/pelvis Acute obstructive uropathy on the right with a 6 x 7 mm calculus at the right UVJ. 2. Chronic but increased intra and extrahepatic biliary ductal size. If there is acute hyperbilirubinemia, consider follow-up MRCP/ERCP. 3. No other acute findings in the abdomen or pelvis.  On reassessment, patient states she is much improved after pain medications. She is non toxic and nonseptic appearing.  She  has had no episodes of vomiting in the ED.  I discussed the results of her CT scan and plan for follow-up with [facility].  She is also given information to follow-up with GI in regards to the the other changes found on her CT scan.  She will be given pain medications, nausea medications and Flomax as well.  Advised on specific return precautions.  She voices understanding the plan and reasons  to return.  All questions answered.  Patient stable for discharge.  ED Discharge Orders         Ordered    cephALEXin (KEFLEX) 500 MG capsule  2 times daily,   Status:  Discontinued     01/10/19 0331    potassium chloride (K-DUR) 10 MEQ tablet  Daily,   Status:  Discontinued     01/10/19 0334    cephALEXin (KEFLEX) 500 MG capsule  2 times daily     01/10/19 0342    potassium chloride (K-DUR) 10 MEQ tablet  Daily     01/10/19 0342    HYDROcodone-acetaminophen (NORCO/VICODIN) 5-325 MG tablet  Every 8 hours PRN     01/10/19 0342    ondansetron (ZOFRAN) 4 MG tablet  Every 8 hours PRN     01/10/19 0342    tamsulosin (FLOMAX) 0.4 MG CAPS capsule  Daily     01/10/19 [place], [place], PA-C 01/10/19 0343    Molpus, Jenny Reichmann, MD 01/10/19 [phone]

## 2019-01-10 ENCOUNTER — Emergency Department (HOSPITAL_COMMUNITY): Payer: PPO

## 2019-01-10 DIAGNOSIS — R279 Unspecified lack of coordination: Secondary | ICD-10-CM | POA: Diagnosis not present

## 2019-01-10 DIAGNOSIS — N132 Hydronephrosis with renal and ureteral calculous obstruction: Secondary | ICD-10-CM | POA: Diagnosis not present

## 2019-01-10 DIAGNOSIS — Z743 Need for continuous supervision: Secondary | ICD-10-CM | POA: Diagnosis not present

## 2019-01-10 DIAGNOSIS — R1084 Generalized abdominal pain: Secondary | ICD-10-CM | POA: Diagnosis not present

## 2019-01-10 DIAGNOSIS — R52 Pain, unspecified: Secondary | ICD-10-CM | POA: Diagnosis not present

## 2019-01-10 LAB — URINALYSIS, ROUTINE W REFLEX MICROSCOPIC
Bacteria, UA: NONE SEEN
Bilirubin Urine: NEGATIVE
Glucose, UA: NEGATIVE mg/dL
Ketones, ur: NEGATIVE mg/dL
Nitrite: NEGATIVE
Protein, ur: NEGATIVE mg/dL
Specific Gravity, Urine: 1.006 (ref 1.005–1.030)
pH: 7 (ref 5.0–8.0)

## 2019-01-10 MED ORDER — POTASSIUM CHLORIDE ER 10 MEQ PO TBCR: 10.0000 meq | EXTENDED_RELEASE_TABLET | Freq: Every day | ORAL | 0 refills | Status: DC

## 2019-01-10 MED ORDER — ONDANSETRON HCL 4 MG PO TABS: 4.0000 mg | ORAL_TABLET | Freq: Three times a day (TID) | ORAL | 0 refills | Status: DC | PRN

## 2019-01-10 MED ORDER — HYDROCODONE-ACETAMINOPHEN 5-325 MG PO TABS: 1.0000 | ORAL_TABLET | Freq: Three times a day (TID) | ORAL | 0 refills | Status: DC | PRN

## 2019-01-10 MED ORDER — SODIUM CHLORIDE (PF) 0.9 % IJ SOLN
INTRAMUSCULAR | Status: AC
  Filled 2019-01-10: qty 50

## 2019-01-10 MED ORDER — CEPHALEXIN 500 MG PO CAPS: 500.0000 mg | ORAL_CAPSULE | Freq: Two times a day (BID) | ORAL | 0 refills | Status: DC

## 2019-01-10 MED ORDER — TAMSULOSIN HCL 0.4 MG PO CAPS: 0.4000 mg | ORAL_CAPSULE | Freq: Every day | ORAL | 0 refills | Status: DC

## 2019-01-10 MED ORDER — CEPHALEXIN 500 MG PO CAPS: 500.0000 mg | ORAL_CAPSULE | Freq: Two times a day (BID) | ORAL | 0 refills | Status: AC

## 2019-01-10 MED ORDER — IOHEXOL 300 MG/ML  SOLN
100.0000 mL | Freq: Once | INTRAMUSCULAR | Status: AC | PRN
  Administered 2019-01-10: 80 mL via INTRAVENOUS

## 2019-01-10 NOTE — ED Notes (Signed)
[facility]AR contacted and paperwork printed  

## 2019-01-10 NOTE — ED Notes (Signed)
Pt verbalized discharge instructions and follow up care. Alert and ambulatory  

## 2019-01-10 NOTE — Discharge Instructions (Addendum)
You were given a prescription for antibiotics. Please take the antibiotic prescription fully.   You were also found to have low potassium today.  You were given a prescription for potassium supplementation.  You will need to have your potassium rechecked by your doctor in about a week.  You were also found to have some changes to the vessels around you liver and may need further imaging as an outpatient. Please follow up with the GI doctor listed on your discharge paperwork to be further evaluated for this.  A culture was sent of your urine today to determine if there is any bacterial growth. If the results of the culture are positive and you require an antibiotic or a change of your prescribed antibiotic you will be contacted by the hospital. If the results are negative you will not be contacted.  You will be given a prescription for Flomax, pain medication, and nausea medication.  You should not drive, work, or operate machinery while taking the pain medication as it can make you very drowsy.  You may take 500mg  Tylenol every 6 hours and take one percocet every 8 hours for breakthrough pain. Make sure you stay well hydrated. You will need to follow-up with urology for reevaluation and for further treatment of your kidney stone.  You will need to return to the emergency department for any fevers, persistent pain, persistent vomiting, inability to urinate, or any new or worsening symptoms.

## 2019-01-10 NOTE — ED Notes (Signed)
PTAR at bedside 

## 2019-01-11 ENCOUNTER — Telehealth (HOSPITAL_COMMUNITY): Payer: Self-pay | Admitting: Emergency Medicine

## 2019-01-11 DIAGNOSIS — S82191S Other fracture of upper end of right tibia, sequela: Secondary | ICD-10-CM | POA: Diagnosis not present

## 2019-01-11 DIAGNOSIS — S82491S Other fracture of shaft of right fibula, sequela: Secondary | ICD-10-CM | POA: Diagnosis not present

## 2019-01-11 DIAGNOSIS — M6281 Muscle weakness (generalized): Secondary | ICD-10-CM | POA: Diagnosis not present

## 2019-01-11 DIAGNOSIS — Z4789 Encounter for other orthopedic aftercare: Secondary | ICD-10-CM | POA: Diagnosis not present

## 2019-01-11 LAB — URINE CULTURE

## 2019-01-11 MED ORDER — OXYCODONE HCL 5 MG PO TABS: 5.0000 mg | ORAL_TABLET | Freq: Four times a day (QID) | ORAL | 0 refills | Status: DC | PRN

## 2019-01-15 ENCOUNTER — Ambulatory Visit: Payer: PPO | Admitting: Physician Assistant

## 2019-01-28 DIAGNOSIS — N201 Calculus of ureter: Secondary | ICD-10-CM | POA: Diagnosis not present

## 2019-01-29 ENCOUNTER — Encounter: Payer: Self-pay | Admitting: Physician Assistant

## 2019-01-29 ENCOUNTER — Telehealth: Payer: Self-pay

## 2019-01-29 ENCOUNTER — Ambulatory Visit (INDEPENDENT_AMBULATORY_CARE_PROVIDER_SITE_OTHER): Payer: PPO | Admitting: Physician Assistant

## 2019-01-29 VITALS — BP 106/72 | HR 90 | Temp 99.1°F | Ht <= 58 in | Wt 110.0 lb

## 2019-01-29 DIAGNOSIS — R935 Abnormal findings on diagnostic imaging of other abdominal regions, including retroperitoneum: Secondary | ICD-10-CM | POA: Diagnosis not present

## 2019-01-29 DIAGNOSIS — Z1211 Encounter for screening for malignant neoplasm of colon: Secondary | ICD-10-CM

## 2019-01-29 DIAGNOSIS — Z9049 Acquired absence of other specified parts of digestive tract: Secondary | ICD-10-CM

## 2019-01-29 NOTE — Patient Instructions (Signed)
If you are age 70 or older, your body mass index should be between 23-30. Your Body mass index is 22.99 kg/m. If this is out of the aforementioned range listed, please consider follow up with your Primary Care Provider.  If you are age 67 or younger, your body mass index should be between 19-25. Your Body mass index is 22.99 kg/m. If this is out of the aformentioned range listed, please consider follow up with your Primary Care Provider.   You have been scheduled for an MRI/MRCP of the abdomen at [facility] on March 06, 2019. Your appointment time is 9 am. Please arrive 15 minutes prior to your appointment time for registration purposes. Please make certain not to have anything to eat or drink 6 hours prior to your test. In addition, if you have any metal in your body, have a pacemaker or defibrillator, please be sure to let your ordering physician know. This test typically takes 45 minutes to 1 hour to complete. Should you need to reschedule, please call [phone] to do so.  Your provider has ordered Cologuard testing as an option for colon cancer screening. This is performed by [employer] and may be out of network with your insurance. PRIOR to completing the test, it is YOUR responsibility to contact your insurance about covered benefits for this test. Your out of pocket expense could be anywhere from $0.00 to $649.00.   When you call to check coverage with your insurer, please provide the following information:   -The ONLY provider of Cologuard is [place] code for Cologuard is [id number].  [employer] NPI # [id number]  -[employer] Tax ID # [id number]   We have already sent your demographic and insurance information to [employer] (phone number [phone]) and they should contact you within the next week regarding your test. If you have not heard from them within the next week, please call our office at  [phone].  We will call you with results.  Thank you for choosing me and [facility].   Amy Esterwood, PA-C

## 2019-01-29 NOTE — Progress Notes (Signed)
Subjective:    Patient ID: Caitlin Irwin, female    DOB: 1950/10/15, 68 y.o.   MRN: [id number]  HPI Caitlin Irwin is a pleasant 68 year old white female, referred by the emergency room after recent visit on 01/09/2019 with acute severe right-sided abdominal pain. Patient had been seen here remotely by Dr. Deatra Irwin in 2008 for colonoscopy which was negative. She had EGD per Dr. Cari Irwin in 2013 pertinent for antral gastritis.  She had a ventral hernia repair in 2017 Patient has history of migraines, IBS, GERD, remote ulcer, status post cholecystectomy. She is currently in an assisted living facility for a rehab stay after staining a right tibial and fibula fracture after a fall in April 2020.  She is anticipating return to home in 2 weeks and will continue physical therapy. She went to the emergency room on June 10 when she developed acute severe stabbing right-sided abdominal pain which radiated around to her back. She had CT of the abdomen and pelvis done with contrast which showed an acute obstructive uropathy on the right with a 6 x 7 mm stone at the right UVJ.  She was also noted to have chronic intra-and extrahepatic ductal dilation which had increased over prior study.  Labs have been reviewed and showed WBC of 14.6, potassium of 3 hemoglobin 12.7, hepatic panel was normal.  Patient says her acute abdominal pain resolved within the next 2 days.  She apparently has been seen by [facility] with follow-up film showing no evidence of stone.  She currently has no complaints of abdominal pain or discomfort.  Her sister-in-law states that she eats like a "bird" has not had any change in her appetite recently.  No complaints of nausea or vomiting.  Weight has been stable.  She denies any difficulty with constipation, no melena or hematochezia. She was unaware of why she was being sent for GI follow-up.  Family history negative for colon cancer.  Review of Systems Pertinent positive and negative  review of systems were noted in the above HPI section.  All other review of systems was otherwise negative.  Outpatient Encounter Medications as of 01/29/2019  Medication Sig   estradiol (ESTRACE) 1 MG tablet Take 1 mg by mouth daily.    FLUoxetine (PROZAC) 40 MG capsule Take 40 mg by mouth daily.   gabapentin (NEURONTIN) 300 MG capsule Take 300 mg by mouth every morning.   levothyroxine (SYNTHROID, LEVOTHROID) 75 MCG tablet Take 75 mcg by mouth daily before breakfast.    ondansetron (ZOFRAN) 4 MG tablet Take 1 tablet (4 mg total) by mouth every 8 (eight) hours as needed for nausea or vomiting.   oxyCODONE (ROXICODONE) 5 MG immediate release tablet Take 1 tablet (5 mg total) by mouth every 6 (six) hours as needed for severe pain.   pantoprazole (PROTONIX) 40 MG tablet Take 40 mg by mouth daily.    pilocarpine (SALAGEN) 5 MG tablet Take 5 mg by mouth daily.    polyethylene glycol (MIRALAX / GLYCOLAX) 17 g packet Take 17 g by mouth daily as needed for mild constipation or moderate constipation.   senna-docusate (SENOKOT-S) 8.6-50 MG tablet Take 2 tablets by mouth 2 (two) times daily.   spironolactone (ALDACTONE) 50 MG tablet Take 1 tablet (50 mg total) by mouth daily before breakfast.   SUMAtriptan (IMITREX) 100 MG tablet Take 100 mg by mouth as needed for migraine. May repeat in 2 hours if headache persists or recurs.   tamsulosin (FLOMAX) 0.4 MG CAPS capsule Take 1  capsule (0.4 mg total) by mouth daily.   topiramate (TOPAMAX) 50 MG tablet Take 50 mg by mouth 2 (two) times daily.   traMADol (ULTRAM) 50 MG tablet Take 25 mg by mouth as needed.   traZODone (DESYREL) 100 MG tablet Take 1 tablet (100 mg total) by mouth at bedtime.   valACYclovir (VALTREX) 500 MG tablet Take 500 mg by mouth daily.   [DISCONTINUED] FLUoxetine (PROZAC) 40 MG capsule Take 40 mg by mouth daily.   [DISCONTINUED] traMADol (ULTRAM) 50 MG tablet Take 25 mg by mouth every 8 (eight) hours as needed for  moderate pain.   ALPRAZolam (XANAX) 0.5 MG tablet Take 1 tablet (0.5 mg total) by mouth 2 (two) times daily as needed for anxiety. (Patient not taking: Reported on 01/29/2019)   HYDROcodone-acetaminophen (NORCO/VICODIN) 5-325 MG tablet Take 1 tablet by mouth every 8 (eight) hours as needed. (Patient not taking: Reported on 01/29/2019)   [DISCONTINUED] eletriptan (RELPAX) 40 MG tablet One tablet by mouth as needed for migraine headache.  If the headache improves and then returns, dose may be repeated after 2 hours have elapsed since first dose (do not exceed 80 mg per day). may repeat in 2 hours if necessary   [DISCONTINUED] ferrous sulfate 325 (65 FE) MG tablet Take 325 mg by mouth daily with breakfast.   [DISCONTINUED] FLUoxetine (PROZAC) 20 MG capsule Take 40 mg by mouth 2 (two) times daily.    [DISCONTINUED] gabapentin (NEURONTIN) 100 MG capsule TAKE 1 CAPSULE BY MOUTH THREE TIMES A DAY (Patient not taking: No sig reported)   [DISCONTINUED] metoCLOPramide (REGLAN) 10 MG tablet Take 1 tablet (10 mg total) by mouth every 6 (six) hours as needed for nausea (nausea/headache). (Patient not taking: Reported on 01/09/2019)   [DISCONTINUED] potassium chloride (K-DUR) 10 MEQ tablet Take 1 tablet (10 mEq total) by mouth daily for 5 days.   [DISCONTINUED] rivaroxaban (XARELTO) 10 MG TABS tablet Take 1 tablet (10 mg total) by mouth daily for 14 days. (Patient not taking: Reported on 01/09/2019)   [DISCONTINUED] SUMAtriptan (IMITREX) 100 MG tablet MAY REPEAT IN 2 HOURS IF HEADACHE PERSISTS OR RECURS (Patient taking differently: Take 100 mg by mouth See admin instructions. Daily as needed for migraine.  May repeat in 2 hours if headache persists.)   [DISCONTINUED] topiramate (TOPAMAX) 25 MG tablet TAKE 2 TABLETS (50 MG TOTAL) BY MOUTH 2 (TWO) TIMES DAILY. (Patient not taking: TAKE 2 TABLETS (50 MG TOTAL) BY MOUTH 2 (TWO) TIMES DAILY.)   No facility-administered encounter medications on file as of 01/29/2019.     Allergies  Allergen Reactions   Acetaminophen Nausea And Vomiting   Patient Active Problem List   Diagnosis Date Noted   Closed fracture of proximal end of right tibia and fibula 11/13/2018   Epigastric hernia 09/15/2015   Migraine, intractable 05/28/2015   Migraine 05/28/2015   Headache, migraine    Insomnia    Emesis    History of hypothyroidism    Microcytic hypochromic anemia    Salicylate poisoning 03/50/0938   Migraine headache 08/28/2014   Hypokalemia 18/29/9371   Salicylate intoxication 08/28/2014   Aspirin toxicity    Ataxia 69/67/8938   Complicated grief 05/17/5101   Altered mental status, unspecified altered mental status type 10/13/2013   Polypharmacy 10/13/2013   Depression 10/13/2013   Atypical face pain 02/10/2013   Esophageal reflux 06/12/2012   Trigeminal neuralgia 11/07/2011   Hx of migraine headaches 10/20/2011   Dysphagia, unspecified(787.20) 09/26/2011   Weight gain 09/26/2011   NAUSEA  02/02/2010   DIARRHEA 02/02/2010   CHANGE IN BOWELS 02/02/2010   ULCER-DUODENAL 11/26/2008   History of peptic ulcer 11/26/2008   GASTRITIS 11/26/2008   HYPOTHYROIDISM 10/03/2007   CONSTIPATION, CHRONIC 10/03/2007   IRRITABLE BOWEL SYNDROME 10/03/2007   COLITIS, HX OF 10/03/2007   Social History   Socioeconomic History   Marital status: Widowed    Spouse name: Not on file   Number of children: 0   Years of education: college   Highest education level: Not on file  Occupational History   Occupation: disabled  Social Designer, fashion/clothing strain: Not on file   Food insecurity    Worry: Not on file    Inability: Not on file   Transportation needs    Medical: Not on file    Non-medical: Not on file  Tobacco Use   Smoking status: Never Smoker   Smokeless tobacco: Never Used  Substance and Sexual Activity   Alcohol use: No   Drug use: No   Sexual activity: Not Currently  Lifestyle   Physical  activity    Days per week: Not on file    Minutes per session: Not on file   Stress: Not on file  Relationships   Social connections    Talks on phone: Not on file    Gets together: Not on file    Attends religious service: Not on file    Active member of club or organization: Not on file    Attends meetings of clubs or organizations: Not on file    Relationship status: Not on file   Intimate partner violence    Fear of current or ex partner: Not on file    Emotionally abused: Not on file    Physically abused: Not on file    Forced sexual activity: Not on file  Other Topics Concern   Not on file  Social History Narrative   Patient lives at home alone.    Patient husband past away 07-03-13.   Patient is widowed.   Patient is on disability.    Patient is right handed.   Patient has no children.   Patient is a college grad.    Ms. Weipert family history includes Diabetes in her father; Heart disease in her maternal grandfather and another family member; Migraines in her brother and mother; Ovarian cancer in her mother.      Objective:    Vitals:   01/29/19 0912  BP: 106/72  Pulse: 90  Temp: 99.1 F (37.3 C)    Physical Exam;Well-developed well-nourished older white female in no acute distress.  Patient comes in in a wheelchair and accompanied by her sister-in-law  Weight, 110 pounds BMI 2.9  HEENT; nontraumatic normocephalic, EOMI, PE R LA, sclera anicteric. Oropharynx; not examined/wearing mass/COVID Neck; supple, no JVD Cardiovascular; regular rate and rhythm with S1-S2, no murmur rub or gallop Pulmonary; Clear bilaterally Abdomen; soft, nontender, nondistended, no palpable mass or hepatosplenomegaly, bowel sounds are active Rectal; not done today Skin; benign exam, no jaundice rash or appreciable lesions Extremities; no clubbing cyanosis or edema skin warm and dry Neuro/Psych; alert and oriented x4, grossly nonfocal mood and affect appropriate         Assessment & Plan:   #28 67 year old white female currently in a rehab facility after a right fibula/fibula fracture sustained in April 2020 after a fall.  Patient anticipating return to home within the next couple of weeks.  #2 incidental finding of intra-and extrahepatic ductal dilation on  CT imaging, felt chronic but increased over prior imaging. Patient is status post remote cholecystectomy. Recent hepatic panel within normal limits  This is likely physiologic never cannot completely rule out stricture or malignancy with increase in dilation over prior imaging. Patient is asymptomatic  #3 recent acute obstructive uropathy secondary to kidney stone at the right UVJ 01/09/2019-symptoms have resolved and follow-up with [facility] showed no evidence for residual stone  #4 colon cancer surveillance-last colonoscopy -2008-overdue for surveillance #5 history of IBS #6.  History of migraines #7.  Trigeminal neuralgia #8.  Hypothyroidism  Plan ; Discussed indication for repeat colon cancer screening with the patient.  She is not interested in pursuing Colonoscopy.  We discussed other screening methods including Cologuard stool DNA testing.  She is agreeable with proceeding with Cologuard, and this will be orederd.  Discussed CT findings with the patient and sister-in-law.  MRI/MRCP is indicated to rule out subtle stricture/malignancy.  I explained that this is unlikely but with increase in chronic dilation would not be ruled out and MRCP would be definitive.  She is agreeable with this but would like to be scheduled towards the end of July 2 allow herself a bit more recovery time and get back home for completing her rehab stay.  We will schedule the MRI/MRCP for first week of August.  Patient will be established with Dr. Silverio Decamp..   Paul Torpey S Ginette Bradway PA-C 01/29/2019   Cc: Levin Erp, MD

## 2019-01-29 NOTE — Telephone Encounter (Signed)
Covid-19 screening questions   Do you now or have you had a fever in the last 14 days? No  Do you have any respiratory symptoms of shortness of breath or cough now or in the last 14 days? No  Do you have any family members or close contacts with diagnosed or suspected Covid-19 in the past 14 days? No  Have you been tested for Covid-19 and found to be positive? Tested twice around 12/09/2018 and 12/2018 and was found negative

## 2019-02-08 DIAGNOSIS — S82201A Unspecified fracture of shaft of right tibia, initial encounter for closed fracture: Secondary | ICD-10-CM | POA: Diagnosis not present

## 2019-02-08 DIAGNOSIS — M6281 Muscle weakness (generalized): Secondary | ICD-10-CM | POA: Diagnosis not present

## 2019-02-08 DIAGNOSIS — R2681 Unsteadiness on feet: Secondary | ICD-10-CM | POA: Diagnosis not present

## 2019-02-10 DIAGNOSIS — S82191S Other fracture of upper end of right tibia, sequela: Secondary | ICD-10-CM | POA: Diagnosis not present

## 2019-02-10 DIAGNOSIS — M6281 Muscle weakness (generalized): Secondary | ICD-10-CM | POA: Diagnosis not present

## 2019-02-10 DIAGNOSIS — S82491S Other fracture of shaft of right fibula, sequela: Secondary | ICD-10-CM | POA: Diagnosis not present

## 2019-02-10 DIAGNOSIS — Z4789 Encounter for other orthopedic aftercare: Secondary | ICD-10-CM | POA: Diagnosis not present

## 2019-02-12 DIAGNOSIS — S82141D Displaced bicondylar fracture of right tibia, subsequent encounter for closed fracture with routine healing: Secondary | ICD-10-CM | POA: Diagnosis not present

## 2019-02-18 DIAGNOSIS — Z9181 History of falling: Secondary | ICD-10-CM | POA: Diagnosis not present

## 2019-02-18 DIAGNOSIS — I10 Essential (primary) hypertension: Secondary | ICD-10-CM | POA: Diagnosis not present

## 2019-02-18 DIAGNOSIS — S82141D Displaced bicondylar fracture of right tibia, subsequent encounter for closed fracture with routine healing: Secondary | ICD-10-CM | POA: Diagnosis not present

## 2019-02-18 DIAGNOSIS — Z79891 Long term (current) use of opiate analgesic: Secondary | ICD-10-CM | POA: Diagnosis not present

## 2019-02-19 ENCOUNTER — Telehealth: Payer: Self-pay | Admitting: Physician Assistant

## 2019-02-19 NOTE — Telephone Encounter (Signed)
Pt's sister called to inform that pt is unable to take Cologuard--she reported that pt was urinating into the kit.    She also requested to cancel MRI as pt uses a wheelchair and has difficulty with motility.

## 2019-02-19 NOTE — Telephone Encounter (Signed)
Please advise 

## 2019-02-19 NOTE — Telephone Encounter (Signed)
Ok - if that is their decision .. we discussed indications for both of these tests at length during office visit. Would ask that pt shcedule a follow up with Dr Silverio Decamp in a couple months after she has recovered  From her LE fracture and is more mobile

## 2019-02-20 NOTE — Telephone Encounter (Signed)
No answer on the phone-left a message with this information on the voicemail. MRI/MRCP canceled

## 2019-02-21 NOTE — Progress Notes (Signed)
Reviewed and agree with documentation and assessment and plan. K. Veena Marylene Masek , MD   

## 2019-03-01 ENCOUNTER — Telehealth: Payer: Self-pay

## 2019-03-01 NOTE — Telephone Encounter (Signed)
Called patient to follow up on Cologuard.  Patient states she has received kit but has not done it yet because she has been sick and not able to walk and needs assistance but she does have it.

## 2019-03-01 NOTE — Telephone Encounter (Signed)
Ok,noted

## 2019-03-05 DIAGNOSIS — I10 Essential (primary) hypertension: Secondary | ICD-10-CM | POA: Diagnosis not present

## 2019-03-05 DIAGNOSIS — S82141D Displaced bicondylar fracture of right tibia, subsequent encounter for closed fracture with routine healing: Secondary | ICD-10-CM | POA: Diagnosis not present

## 2019-03-05 DIAGNOSIS — Z9181 History of falling: Secondary | ICD-10-CM | POA: Diagnosis not present

## 2019-03-05 DIAGNOSIS — Z79891 Long term (current) use of opiate analgesic: Secondary | ICD-10-CM | POA: Diagnosis not present

## 2019-03-06 ENCOUNTER — Ambulatory Visit (HOSPITAL_COMMUNITY): Payer: PPO

## 2019-03-13 DIAGNOSIS — S82491S Other fracture of shaft of right fibula, sequela: Secondary | ICD-10-CM | POA: Diagnosis not present

## 2019-03-13 DIAGNOSIS — S82191S Other fracture of upper end of right tibia, sequela: Secondary | ICD-10-CM | POA: Diagnosis not present

## 2019-03-13 DIAGNOSIS — Z4789 Encounter for other orthopedic aftercare: Secondary | ICD-10-CM | POA: Diagnosis not present

## 2019-03-13 DIAGNOSIS — S82141D Displaced bicondylar fracture of right tibia, subsequent encounter for closed fracture with routine healing: Secondary | ICD-10-CM | POA: Diagnosis not present

## 2019-03-13 DIAGNOSIS — M6281 Muscle weakness (generalized): Secondary | ICD-10-CM | POA: Diagnosis not present

## 2019-03-14 ENCOUNTER — Encounter: Payer: Self-pay | Admitting: *Deleted

## 2019-03-19 DIAGNOSIS — S82141D Displaced bicondylar fracture of right tibia, subsequent encounter for closed fracture with routine healing: Secondary | ICD-10-CM | POA: Diagnosis not present

## 2019-03-19 DIAGNOSIS — Z79891 Long term (current) use of opiate analgesic: Secondary | ICD-10-CM | POA: Diagnosis not present

## 2019-03-19 DIAGNOSIS — Z9181 History of falling: Secondary | ICD-10-CM | POA: Diagnosis not present

## 2019-03-19 DIAGNOSIS — I10 Essential (primary) hypertension: Secondary | ICD-10-CM | POA: Diagnosis not present

## 2019-03-20 ENCOUNTER — Other Ambulatory Visit: Payer: Self-pay | Admitting: Neurology

## 2019-03-20 DIAGNOSIS — S82141D Displaced bicondylar fracture of right tibia, subsequent encounter for closed fracture with routine healing: Secondary | ICD-10-CM | POA: Diagnosis not present

## 2019-03-28 ENCOUNTER — Encounter: Payer: Self-pay | Admitting: *Deleted

## 2019-04-02 DIAGNOSIS — I10 Essential (primary) hypertension: Secondary | ICD-10-CM | POA: Diagnosis not present

## 2019-04-02 DIAGNOSIS — S82141D Displaced bicondylar fracture of right tibia, subsequent encounter for closed fracture with routine healing: Secondary | ICD-10-CM | POA: Diagnosis not present

## 2019-04-02 DIAGNOSIS — Z79891 Long term (current) use of opiate analgesic: Secondary | ICD-10-CM | POA: Diagnosis not present

## 2019-04-02 DIAGNOSIS — Z9181 History of falling: Secondary | ICD-10-CM | POA: Diagnosis not present

## 2019-04-13 DIAGNOSIS — Z4789 Encounter for other orthopedic aftercare: Secondary | ICD-10-CM | POA: Diagnosis not present

## 2019-04-13 DIAGNOSIS — S82191S Other fracture of upper end of right tibia, sequela: Secondary | ICD-10-CM | POA: Diagnosis not present

## 2019-04-13 DIAGNOSIS — M6281 Muscle weakness (generalized): Secondary | ICD-10-CM | POA: Diagnosis not present

## 2019-04-13 DIAGNOSIS — S82491S Other fracture of shaft of right fibula, sequela: Secondary | ICD-10-CM | POA: Diagnosis not present

## 2019-05-13 DIAGNOSIS — M6281 Muscle weakness (generalized): Secondary | ICD-10-CM | POA: Diagnosis not present

## 2019-05-13 DIAGNOSIS — S82191S Other fracture of upper end of right tibia, sequela: Secondary | ICD-10-CM | POA: Diagnosis not present

## 2019-05-13 DIAGNOSIS — S82491S Other fracture of shaft of right fibula, sequela: Secondary | ICD-10-CM | POA: Diagnosis not present

## 2019-05-13 DIAGNOSIS — Z4789 Encounter for other orthopedic aftercare: Secondary | ICD-10-CM | POA: Diagnosis not present

## 2019-05-20 DIAGNOSIS — G500 Trigeminal neuralgia: Secondary | ICD-10-CM | POA: Diagnosis not present

## 2019-06-09 ENCOUNTER — Emergency Department (HOSPITAL_COMMUNITY)
Admission: EM | Admit: 2019-06-09 | Discharge: 2019-06-09 | Disposition: A | Discharge status: Home / Self Care | Payer: PPO | Attending: Emergency Medicine | Admitting: Emergency Medicine

## 2019-06-09 ENCOUNTER — Other Ambulatory Visit: Payer: Self-pay

## 2019-06-09 DIAGNOSIS — F419 Anxiety disorder, unspecified: Secondary | ICD-10-CM

## 2019-06-09 DIAGNOSIS — Z79899 Other long term (current) drug therapy: Secondary | ICD-10-CM | POA: Diagnosis not present

## 2019-06-09 DIAGNOSIS — R0689 Other abnormalities of breathing: Secondary | ICD-10-CM | POA: Diagnosis not present

## 2019-06-09 DIAGNOSIS — G500 Trigeminal neuralgia: Secondary | ICD-10-CM

## 2019-06-09 DIAGNOSIS — R457 State of emotional shock and stress, unspecified: Secondary | ICD-10-CM | POA: Diagnosis not present

## 2019-06-09 DIAGNOSIS — R519 Headache, unspecified: Secondary | ICD-10-CM | POA: Diagnosis present

## 2019-06-09 MED ORDER — ALPRAZOLAM 0.5 MG PO TABS: 0.5000 mg | ORAL_TABLET | Freq: Every evening | ORAL | 0 refills | Status: DC | PRN

## 2019-06-09 MED ORDER — SUMATRIPTAN SUCCINATE 100 MG PO TABS: 100.0000 mg | ORAL_TABLET | ORAL | 0 refills | Status: AC | PRN

## 2019-06-09 MED ORDER — GABAPENTIN 300 MG PO CAPS: 300.0000 mg | ORAL_CAPSULE | ORAL | 0 refills | Status: DC

## 2019-06-09 NOTE — ED Provider Notes (Signed)
[facility] Provider Note   CSN: [id number] Arrival date & time: 06/09/19  0631     History   Chief Complaint Chief Complaint  Patient presents with   Facial Pain    HPI Caitlin Irwin is a 68 y.o. female.     The history is provided by the patient and medical records. No language interpreter was used.  Illness Location:  Worsen chronic left trigeminal neuralgia pain Quality:  Consistent with prior Severity:  Severe Onset quality:  Gradual Timing:  Constant Progression:  Unchanged Chronicity:  Chronic Associated symptoms: shortness of breath (Shortness of breath earlier that has resolved.)   Associated symptoms: no abdominal pain, no chest pain, no congestion, no cough, no diarrhea, no fatigue, no fever, no headaches, no loss of consciousness, no nausea, no vomiting and no wheezing     Past Medical History:  Diagnosis Date   Accidental poisoning by aspirin 09/2014   Anemia    Anxiety    Arthritis    "right knee; right shoulder" (09/15/2015)   Ataxia 03/24/2014   Cataract    both eyes   Chronic lower back pain    scoliosis   Depression    GERD (gastroesophageal reflux disease)    H/O hiatal hernia    High cholesterol    History of blood transfusion    "related to OR"   Hypersomnia    IBS (irritable bowel syndrome)    Migraines    "maybe once/week now" (09/15/2015)   Osteoporosis    Other constipation    Parotid tumor    PONV (postoperative nausea and vomiting)    hx of " getting too much" - 1998, slow to take up by body    PUD (peptic ulcer disease)    Trigeminal neuralgia    S/P radiation therapy    Unspecified hypothyroidism     Patient Active Problem List   Diagnosis Date Noted   Closed fracture of proximal end of right tibia and fibula 11/13/2018   Bilateral temporomandibular joint pain 10/21/2015   Ulceration, tongue traumatic 10/21/2015   Epigastric hernia 09/15/2015    Migraine, intractable 05/28/2015   Migraine 05/28/2015   Headache, migraine    Insomnia    Emesis    History of hypothyroidism    Microcytic hypochromic anemia    Salicylate poisoning [id number]   Migraine headache 08/28/2014   Hypokalemia [id number]   Salicylate intoxication 08/28/2014   Aspirin toxicity    Ataxia 0000000   Complicated grief 0000000   Altered mental status, unspecified altered mental status type 10/13/2013   Polypharmacy 10/13/2013   Depression 10/13/2013   Atypical face pain 02/10/2013   Esophageal reflux 06/12/2012   Trigeminal neuralgia 11/07/2011   Trigeminal neuralgia of left side of face 11/07/2011   Hx of migraine headaches 10/20/2011   Dysphagia, unspecified(787.20) 09/26/2011   Weight gain 09/26/2011   NAUSEA 02/02/2010   DIARRHEA 02/02/2010   CHANGE IN BOWELS 02/02/2010   ULCER-DUODENAL 11/26/2008   History of peptic ulcer 11/26/2008   GASTRITIS 11/26/2008   HYPOTHYROIDISM 10/03/2007   CONSTIPATION, CHRONIC 10/03/2007   IRRITABLE BOWEL SYNDROME 10/03/2007   COLITIS, HX OF 10/03/2007    Past Surgical History:  Procedure Laterality Date   ABDOMINAL HYSTERECTOMY     ANKLE FRACTURE SURGERY Left    ANTERIOR LUMBAR FUSION  04/2010   L4-5; L5-S1   BACK SURGERY     BRAIN SURGERY     parotid tumor removed    CHOLECYSTECTOMY OPEN  EPIGASTRIC HERNIA REPAIR  09/15/2015   Procedure: OPEN HERNIA REPAIR EPIGASTRIC ADULT;  Surgeon: Greer Pickerel, MD;  Location: [facility];  Service: General;;   ESOPHAGOGASTRODUODENOSCOPY  06/12/2012   Procedure: ESOPHAGOGASTRODUODENOSCOPY (EGD);  Surgeon: Lear Ng, MD;  Location: [facility];  Service: Endoscopy;  Laterality: N/A;   EXTERNAL FIXATION LEG Right 11/14/2018   Procedure: EXTERNAL FIXATION LEG;  Surgeon: Hiram Gash, MD;  Location: [facility];  Service: Orthopedics;  Laterality: Right;   FRACTURE SURGERY     gamma knife for trig neuralgia  12/27/11    HERNIA REPAIR     KNEE CARTILAGE SURGERY Right 1981   "converted from scope to open during the OR; ligament repair"   LAMINECTOMY AND MICRODISCECTOMY LUMBAR SPINE  1980   L4-5   LAPAROSCOPIC EPIGASTRIC HERNIA REPAIR  09/15/2015   open   ORIF TIBIA PLATEAU Right 11/16/2018   Procedure: OPEN REDUCTION INTERNAL FIXATION (ORIF) TIBIAL PLATEAU;  Surgeon: Hiram Gash, MD;  Location: [facility];  Service: Orthopedics;  Laterality: Right;   PAROTID GLAND TUMOR EXCISION     benign   SHOULDER ARTHROSCOPY W/ ROTATOR CUFF REPAIR Right 11-2014   TRIGEMINAL NERVE DECOMPRESSION     to stop migraines and it did not work      OB History   No obstetric history on file.      Home Medications    Prior to Admission medications   Medication Sig Start Date End Date Taking? Authorizing Provider  ALPRAZolam Duanne Moron) 0.5 MG tablet Take 1 tablet (0.5 mg total) by mouth 2 (two) times daily as needed for anxiety. Patient not taking: Reported on 01/29/2019 11/20/18   Roxan Hockey, MD  estradiol (ESTRACE) 1 MG tablet Take 1 mg by mouth daily.  04/21/13   [provider]  FLUoxetine (PROZAC) 40 MG capsule Take 40 mg by mouth daily.    [provider]  gabapentin (NEURONTIN) 300 MG capsule Take 300 mg by mouth every morning.    [provider]  HYDROcodone-acetaminophen (NORCO/VICODIN) 5-325 MG tablet Take 1 tablet by mouth every 8 (eight) hours as needed. Patient not taking: Reported on 01/29/2019 01/10/19   Couture, Cortni S, PA-C  levothyroxine (SYNTHROID, LEVOTHROID) 75 MCG tablet Take 75 mcg by mouth daily before breakfast.  04/09/17   [provider]  ondansetron (ZOFRAN) 4 MG tablet Take 1 tablet (4 mg total) by mouth every 8 (eight) hours as needed for nausea or vomiting. 01/10/19   Couture, Cortni S, PA-C  oxyCODONE (ROXICODONE) 5 MG immediate release tablet Take 1 tablet (5 mg total) by mouth every 6 (six) hours as needed for severe pain. 01/11/19   Lawyer, Harrell Gave,  PA-C  pantoprazole (PROTONIX) 40 MG tablet Take 40 mg by mouth daily.  02/18/12   [provider]  pilocarpine (SALAGEN) 5 MG tablet Take 5 mg by mouth daily.  02/17/15   [provider]  polyethylene glycol (MIRALAX / GLYCOLAX) 17 g packet Take 17 g by mouth daily as needed for mild constipation or moderate constipation. 11/20/18   Roxan Hockey, MD  senna-docusate (SENOKOT-S) 8.6-50 MG tablet Take 2 tablets by mouth 2 (two) times daily. 11/20/18   Roxan Hockey, MD  spironolactone (ALDACTONE) 50 MG tablet Take 1 tablet (50 mg total) by mouth daily before breakfast. 11/20/18   Denton Brick, Courage, MD  SUMAtriptan (IMITREX) 100 MG tablet Take 100 mg by mouth as needed for migraine. May repeat in 2 hours if headache persists or recurs.    [provider]  tamsulosin (FLOMAX) 0.4 MG CAPS capsule Take 1 capsule (0.4 mg total) by mouth daily. 01/10/19   Couture, Cortni S, PA-C  topiramate (TOPAMAX) 50 MG tablet Take 50 mg by mouth 2 (two) times daily.    [provider]  traMADol (ULTRAM) 50 MG tablet Take 25 mg by mouth as needed.    [provider]  traZODone (DESYREL) 100 MG tablet Take 1 tablet (100 mg total) by mouth at bedtime. 11/20/18   Roxan Hockey, MD  valACYclovir (VALTREX) 500 MG tablet Take 500 mg by mouth daily.    [provider]  eletriptan (RELPAX) 40 MG tablet One tablet by mouth as needed for migraine headache.  If the headache improves and then returns, dose may be repeated after 2 hours have elapsed since first dose (do not exceed 80 mg per day). may repeat in 2 hours if necessary  10/20/11  [provider]  ferrous sulfate 325 (65 FE) MG tablet Take 325 mg by mouth daily with breakfast.  10/20/11  [provider]    Family History Family History  Problem Relation Age of Onset   Ovarian cancer Mother    Migraines Mother    Diabetes Father    Migraines Brother    Heart disease Other    Heart disease  Maternal Grandfather    Colon cancer Neg Hx    Esophageal cancer Neg Hx     Social History Social History   Tobacco Use   Smoking status: Never Smoker   Smokeless tobacco: Never Used  Substance Use Topics   Alcohol use: No   Drug use: No     Allergies   Acetaminophen   Review of Systems Review of Systems  Constitutional: Negative for chills, diaphoresis, fatigue and fever.  HENT: Negative for congestion and facial swelling (facial pain).   Eyes: Negative for visual disturbance.  Respiratory: Positive for shortness of breath (Shortness of breath earlier that has resolved.). Negative for cough, chest tightness and wheezing.   Cardiovascular: Negative for chest pain, palpitations and leg swelling.  Gastrointestinal: Negative for abdominal pain, constipation, diarrhea, nausea and vomiting.  Genitourinary: Negative for dysuria.  Musculoskeletal: Negative for back pain, neck pain and neck stiffness.  Neurological: Negative for loss of consciousness, weakness, numbness (tingling on L face consistent with prior) and headaches.  Psychiatric/Behavioral: Negative for agitation and confusion.  All other systems reviewed and are negative.    Physical Exam Updated Vital Signs BP (!) 146/81    Pulse 65    Temp 98.5 F (36.9 C) (Oral)    Resp 18    SpO2 98%   Physical Exam Vitals signs and nursing note reviewed.  Constitutional:      General: She is not in acute distress.    Appearance: Normal appearance. She is well-developed. She is not ill-appearing, toxic-appearing or diaphoretic.  HENT:     Head: Normocephalic and atraumatic.     Right Ear: External ear normal.     Left Ear: External ear normal.     Nose: Nose normal. No congestion or rhinorrhea.     Mouth/Throat:     Mouth: Mucous membranes are moist.     Pharynx: No oropharyngeal exudate or posterior oropharyngeal erythema.  Eyes:     General: No scleral icterus.    Extraocular Movements: Extraocular movements  intact.     Conjunctiva/sclera: Conjunctivae normal.     Pupils: Pupils are equal, round, and reactive to light.  Neck:     Musculoskeletal:  Normal range of motion and neck supple. No muscular tenderness.  Cardiovascular:     Rate and Rhythm: Normal rate.     Pulses: Normal pulses.     Heart sounds: No murmur.  Pulmonary:     Effort: Pulmonary effort is normal. No respiratory distress.     Breath sounds: No stridor. No wheezing, rhonchi or rales.  Chest:     Chest wall: No tenderness.  Abdominal:     General: Abdomen is flat. Bowel sounds are normal. There is no distension.     Tenderness: There is no abdominal tenderness. There is no right CVA tenderness, left CVA tenderness, guarding or rebound.  Musculoskeletal:        General: No tenderness.     Right lower leg: No edema.     Left lower leg: No edema.  Skin:    General: Skin is warm.     Coloration: Skin is not pale.     Findings: No erythema or rash.  Neurological:     General: No focal deficit present.     Mental Status: She is alert and oriented to person, place, and time.     Sensory: No sensory deficit.     Motor: No weakness or abnormal muscle tone.     Deep Tendon Reflexes: Reflexes are normal and symmetric.  Psychiatric:        Mood and Affect: Mood is anxious.      ED Treatments / Results  Labs (all labs ordered are listed, but only abnormal results are displayed) Labs Reviewed - No data to display  EKG None  Radiology No results found.  Procedures Procedures (including critical care time)  Medications Ordered in ED Medications - No data to display   Initial Impression / Assessment and Plan / ED Course  I have reviewed the triage vital signs and the nursing notes.  Pertinent labs & imaging results that were available during my care of the patient were reviewed by me and considered in my medical decision making (see chart for details).        Caitlin Irwin is a 68 y.o. female with a past  medical history significant for hypothyroidism, depression, irritable bowel syndrome, chronic trigeminal neuralgia with severe left facial pain, migraines, and prior right leg injuries who presents with anxiety and left facial pain.  She reports that her caregiver was on vacation the last several days and so she has not had any assistance with medications and she reports that she had run out of them.  She says that the caregivers come back this week and needed a new prescription for several days of medication to help with her symptoms.  She is having chronic left facial pain and numbness that is at the baseline when she is not having medications.  She is also very anxious due to being out of other medications.  She reports she has chronic abdominal aching that is unchanged from baseline.  She denies other new injuries or other complaints.  On exam, Lungs clear and chest is nontender.  Abdomen is nontender.  Patient moving all extremities.  Normal grip strength and sensation in arms.  Normal strength and sensation in legs.  Smile symmetric.  Tingling in left face at baseline.  No rash to suggest shingles.  No redness or evidence of erythema or cellulitis.  No oropharyngeal abnormality seen on exam.  Pupils symmetric and reactive.  Normal extraocular movements.  Patient otherwise resting comfortably.  Patient reports she is out  of her alprazolam, gabapentin, and sumatriptan.  She is requesting several days of these medications to get her to when her caregiver will be here this week.  Given her lack of other complaints, I will refill several days of medication for her.  Patient has no other complaints and agrees this plan.  She does not want further work including chest x-ray, labs, or other work-up.  She does report she has occasional shortness of breath that she had earlier that has resolved.  She does not want further work-up of this.  Patient will follow up with her PCP and understands return precautions.   She no other questions or concerns and was discharged in good condition.    Final Clinical Impressions(s) / ED Diagnoses   Final diagnoses:  Trigeminal neuralgia of left side of face  Anxiety    ED Discharge Orders         Ordered    ALPRAZolam (XANAX) 0.5 MG tablet  At bedtime PRN     06/09/19 0733    gabapentin (NEURONTIN) 300 MG capsule  BH-each morning     06/09/19 0733    SUMAtriptan (IMITREX) 100 MG tablet  Every 2 hours PRN     06/09/19 0733          Clinical Impression: 1. Trigeminal neuralgia of left side of face   2. Anxiety     Disposition: Discharge  Condition: Good  I have discussed the results, Dx and Tx plan with the pt(& family if present). He/she/they expressed understanding and agree(s) with the plan. Discharge instructions discussed at great length. Strict return precautions discussed and pt &/or family have verbalized understanding of the instructions. No further questions at time of discharge.    New Prescriptions   ALPRAZOLAM (XANAX) 0.5 MG TABLET    Take 1 tablet (0.5 mg total) by mouth at bedtime as needed for anxiety.   GABAPENTIN (NEURONTIN) 300 MG CAPSULE    Take 1 capsule (300 mg total) by mouth every morning.   SUMATRIPTAN (IMITREX) 100 MG TABLET    Take 1 tablet (100 mg total) by mouth every 2 (two) hours as needed for migraine. May repeat in 2 hours if headache persists or recurs.    Follow Up: Levin Erp, MD [place], [place] [address] [address] [facility] [address] [id number] [address] [place]       Leonardo Plaia, Gwenyth Allegra, MD 06/09/19 [phone]

## 2019-06-09 NOTE — Discharge Instructions (Signed)
Your history and exam today are consistent with flareup of your chronic trigeminal neuralgia pain and anxiety.  We offered further work-up including labs, imaging, and further monitoring however you were not interested in that today.  As your symptoms are improving, we provide you prescription for several days of your medications and please follow-up with your primary doctor this week.  Please use your caregiver to help navigate your further management and if any symptoms change or worsen, please return to nearest emergency department.

## 2019-06-09 NOTE — ED Triage Notes (Signed)
Pt c/o left sided facial pain that woke her up. States that she has a hx Trigeminal Neuralgia and takes Gabapentin for it, but has run out of this medication.

## 2019-06-11 DIAGNOSIS — G500 Trigeminal neuralgia: Secondary | ICD-10-CM | POA: Diagnosis not present

## 2019-06-11 DIAGNOSIS — S82141D Displaced bicondylar fracture of right tibia, subsequent encounter for closed fracture with routine healing: Secondary | ICD-10-CM | POA: Diagnosis not present

## 2019-06-13 DIAGNOSIS — S82191S Other fracture of upper end of right tibia, sequela: Secondary | ICD-10-CM | POA: Diagnosis not present

## 2019-06-13 DIAGNOSIS — S82491S Other fracture of shaft of right fibula, sequela: Secondary | ICD-10-CM | POA: Diagnosis not present

## 2019-06-13 DIAGNOSIS — Z4789 Encounter for other orthopedic aftercare: Secondary | ICD-10-CM | POA: Diagnosis not present

## 2019-06-13 DIAGNOSIS — M6281 Muscle weakness (generalized): Secondary | ICD-10-CM | POA: Diagnosis not present

## 2019-07-13 DIAGNOSIS — Z4789 Encounter for other orthopedic aftercare: Secondary | ICD-10-CM | POA: Diagnosis not present

## 2019-07-13 DIAGNOSIS — S82491S Other fracture of shaft of right fibula, sequela: Secondary | ICD-10-CM | POA: Diagnosis not present

## 2019-07-13 DIAGNOSIS — S82191S Other fracture of upper end of right tibia, sequela: Secondary | ICD-10-CM | POA: Diagnosis not present

## 2019-07-13 DIAGNOSIS — M6281 Muscle weakness (generalized): Secondary | ICD-10-CM | POA: Diagnosis not present

## 2019-08-13 DIAGNOSIS — S82491S Other fracture of shaft of right fibula, sequela: Secondary | ICD-10-CM | POA: Diagnosis not present

## 2019-08-13 DIAGNOSIS — M6281 Muscle weakness (generalized): Secondary | ICD-10-CM | POA: Diagnosis not present

## 2019-08-13 DIAGNOSIS — Z4789 Encounter for other orthopedic aftercare: Secondary | ICD-10-CM | POA: Diagnosis not present

## 2019-08-13 DIAGNOSIS — S82191S Other fracture of upper end of right tibia, sequela: Secondary | ICD-10-CM | POA: Diagnosis not present

## 2019-08-28 DIAGNOSIS — G500 Trigeminal neuralgia: Secondary | ICD-10-CM | POA: Diagnosis not present

## 2019-09-09 DIAGNOSIS — G501 Atypical facial pain: Secondary | ICD-10-CM | POA: Diagnosis not present

## 2019-09-09 DIAGNOSIS — G500 Trigeminal neuralgia: Secondary | ICD-10-CM | POA: Diagnosis not present

## 2019-09-13 DIAGNOSIS — M6281 Muscle weakness (generalized): Secondary | ICD-10-CM | POA: Diagnosis not present

## 2019-09-13 DIAGNOSIS — Z4789 Encounter for other orthopedic aftercare: Secondary | ICD-10-CM | POA: Diagnosis not present

## 2019-09-13 DIAGNOSIS — S82491S Other fracture of shaft of right fibula, sequela: Secondary | ICD-10-CM | POA: Diagnosis not present

## 2019-09-13 DIAGNOSIS — S82191S Other fracture of upper end of right tibia, sequela: Secondary | ICD-10-CM | POA: Diagnosis not present

## 2019-10-02 ENCOUNTER — Encounter: Payer: Self-pay | Admitting: Adult Health

## 2019-10-02 ENCOUNTER — Telehealth: Payer: Self-pay

## 2019-10-02 ENCOUNTER — Ambulatory Visit: Payer: PPO | Admitting: Adult Health

## 2019-10-02 NOTE — Telephone Encounter (Signed)
Patient was a no call/no show for their appointment today.   

## 2019-10-11 DIAGNOSIS — S82191S Other fracture of upper end of right tibia, sequela: Secondary | ICD-10-CM | POA: Diagnosis not present

## 2019-10-11 DIAGNOSIS — S82491S Other fracture of shaft of right fibula, sequela: Secondary | ICD-10-CM | POA: Diagnosis not present

## 2019-10-11 DIAGNOSIS — Z4789 Encounter for other orthopedic aftercare: Secondary | ICD-10-CM | POA: Diagnosis not present

## 2019-10-11 DIAGNOSIS — M6281 Muscle weakness (generalized): Secondary | ICD-10-CM | POA: Diagnosis not present

## 2019-10-22 NOTE — Telephone Encounter (Signed)
done

## 2019-10-27 ENCOUNTER — Emergency Department (HOSPITAL_COMMUNITY): Payer: PPO

## 2019-10-27 ENCOUNTER — Emergency Department (HOSPITAL_COMMUNITY)
Admission: EM | Admit: 2019-10-27 | Discharge: 2019-10-28 | Disposition: A | Discharge status: Home / Self Care | Payer: PPO | Attending: Emergency Medicine | Admitting: Emergency Medicine

## 2019-10-27 DIAGNOSIS — W1830XA Fall on same level, unspecified, initial encounter: Secondary | ICD-10-CM | POA: Insufficient documentation

## 2019-10-27 DIAGNOSIS — Y92019 Unspecified place in single-family (private) house as the place of occurrence of the external cause: Secondary | ICD-10-CM | POA: Insufficient documentation

## 2019-10-27 DIAGNOSIS — E039 Hypothyroidism, unspecified: Secondary | ICD-10-CM | POA: Diagnosis not present

## 2019-10-27 DIAGNOSIS — Y999 Unspecified external cause status: Secondary | ICD-10-CM | POA: Diagnosis not present

## 2019-10-27 DIAGNOSIS — Y939 Activity, unspecified: Secondary | ICD-10-CM | POA: Insufficient documentation

## 2019-10-27 DIAGNOSIS — S42031A Displaced fracture of lateral end of right clavicle, initial encounter for closed fracture: Secondary | ICD-10-CM

## 2019-10-27 DIAGNOSIS — Z79899 Other long term (current) drug therapy: Secondary | ICD-10-CM | POA: Insufficient documentation

## 2019-10-27 DIAGNOSIS — R0902 Hypoxemia: Secondary | ICD-10-CM | POA: Diagnosis not present

## 2019-10-27 DIAGNOSIS — M25511 Pain in right shoulder: Secondary | ICD-10-CM | POA: Diagnosis present

## 2019-10-27 DIAGNOSIS — R52 Pain, unspecified: Secondary | ICD-10-CM | POA: Diagnosis not present

## 2019-10-27 DIAGNOSIS — E876 Hypokalemia: Secondary | ICD-10-CM | POA: Insufficient documentation

## 2019-10-27 DIAGNOSIS — W19XXXA Unspecified fall, initial encounter: Secondary | ICD-10-CM | POA: Diagnosis not present

## 2019-10-27 MED ORDER — OXYCODONE HCL 5 MG PO TABS
5.0000 mg | ORAL_TABLET | Freq: Once | ORAL | Status: AC
  Administered 2019-10-27: 5 mg via ORAL
  Filled 2019-10-27: qty 1

## 2019-10-27 NOTE — ED Provider Notes (Signed)
[facility] EMERGENCY DEPARTMENT Provider Note   CSN: [id number] Arrival date & time: 10/27/19  2222     History Chief Complaint  Patient presents with  . Fall    Caitlin Irwin is a 69 y.o. female.   69 year old female with a history of IBS, esophageal reflux, dyslipidemia, hypersomnia and anxiety presents to the emergency department via EMS from home.  Patient reportedly had a mechanical fall landing on her right shoulder.  Patient is unable to provide further detail on the events surrounding the fall; poor and vague historian.  Denies vomiting, abdominal pain, bowel/bladder incontinence, recent fevers. She denies use of chronic narcotics, but appears to be prescribed chronic Xanax, gabapentin, Tramadol.  The patient is not on chronic anticoagulation.  She lives at home alone.  The history is provided by the patient. No language interpreter was used.  Fall       Past Medical History:  Diagnosis Date  . Accidental poisoning by aspirin 09/2014  . Anemia   . Anxiety   . Arthritis    "right knee; right shoulder" (09/15/2015)  . Ataxia 03/24/2014  . Cataract    both eyes  . Chronic lower back pain    scoliosis  . Depression   . GERD (gastroesophageal reflux disease)   . H/O hiatal hernia   . High cholesterol   . History of blood transfusion    "related to OR"  . Hypersomnia   . IBS (irritable bowel syndrome)   . Migraines    "maybe once/week now" (09/15/2015)  . Osteoporosis   . Other constipation   . Parotid tumor   . PONV (postoperative nausea and vomiting)    hx of " getting too much" - 1998, slow to take up by body   . PUD (peptic ulcer disease)   . Trigeminal neuralgia    S/P radiation therapy   . Unspecified hypothyroidism     Patient Active Problem List   Diagnosis Date Noted  . Closed fracture of proximal end of right tibia and fibula 11/13/2018  . Bilateral temporomandibular joint pain 10/21/2015  . Ulceration, tongue traumatic  10/21/2015  . Epigastric hernia 09/15/2015  . Migraine, intractable 05/28/2015  . Migraine 05/28/2015  . Headache, migraine   . Insomnia   . Emesis   . History of hypothyroidism   . Microcytic hypochromic anemia   . Salicylate poisoning [id number]  . Migraine headache 08/28/2014  . Hypokalemia 08/28/2014  . Salicylate intoxication 08/28/2014  . Aspirin toxicity   . Ataxia 03/24/2014  . Complicated grief 0000000  . Altered mental status, unspecified altered mental status type 10/13/2013  . Polypharmacy 10/13/2013  . Depression 10/13/2013  . Atypical face pain 02/10/2013  . Esophageal reflux 06/12/2012  . Trigeminal neuralgia 11/07/2011  . Trigeminal neuralgia of left side of face 11/07/2011  . Hx of migraine headaches 10/20/2011  . Dysphagia, unspecified(787.20) 09/26/2011  . Weight gain 09/26/2011  . NAUSEA 02/02/2010  . DIARRHEA 02/02/2010  . CHANGE IN BOWELS 02/02/2010  . ULCER-DUODENAL 11/26/2008  . History of peptic ulcer 11/26/2008  . GASTRITIS 11/26/2008  . HYPOTHYROIDISM 10/03/2007  . CONSTIPATION, CHRONIC 10/03/2007  . IRRITABLE BOWEL SYNDROME 10/03/2007  . COLITIS, HX OF 10/03/2007    Past Surgical History:  Procedure Laterality Date  . ABDOMINAL HYSTERECTOMY    . ANKLE FRACTURE SURGERY Left   . ANTERIOR LUMBAR FUSION  04/2010   L4-5; L5-S1  . BACK SURGERY    . BRAIN SURGERY     parotid  tumor removed   . CHOLECYSTECTOMY OPEN    . EPIGASTRIC HERNIA REPAIR  09/15/2015   Procedure: OPEN HERNIA REPAIR EPIGASTRIC ADULT;  Surgeon: Greer Pickerel, MD;  Location: [place];  Service: General;;  . ESOPHAGOGASTRODUODENOSCOPY  06/12/2012   Procedure: ESOPHAGOGASTRODUODENOSCOPY (EGD);  Surgeon: Lear Ng, MD;  Location: [facility];  Service: Endoscopy;  Laterality: N/A;  . EXTERNAL FIXATION LEG Right 11/14/2018   Procedure: EXTERNAL FIXATION LEG;  Surgeon: Hiram Gash, MD;  Location: [place];  Service: Orthopedics;  Laterality: Right;  . FRACTURE SURGERY    .  gamma knife for trig neuralgia  12/27/11  . [place]   "converted from scope to open during the OR; ligament repair"  . LAMINECTOMY AND MICRODISCECTOMY LUMBAR SPINE  1980   L4-5  . LAPAROSCOPIC EPIGASTRIC HERNIA REPAIR  09/15/2015   open  . ORIF TIBIA PLATEAU Right 11/16/2018   Procedure: OPEN REDUCTION INTERNAL FIXATION (ORIF) TIBIAL PLATEAU;  Surgeon: Hiram Gash, MD;  Location: [place];  Service: Orthopedics;  Laterality: Right;  . PAROTID GLAND TUMOR EXCISION     benign  . SHOULDER ARTHROSCOPY W/ ROTATOR CUFF REPAIR Right 11-2014  . TRIGEMINAL NERVE DECOMPRESSION     to stop migraines and it did not work      OB History   No obstetric history on file.     Family History  Problem Relation Age of Onset  . Ovarian cancer Mother   . Migraines Mother   . Diabetes Father   . Migraines Brother   . Heart disease Other   . Heart disease Maternal Grandfather   . Colon cancer Neg Hx   . Esophageal cancer Neg Hx     Social History   Tobacco Use  . Smoking status: Never Smoker  . Smokeless tobacco: Never Used  Substance Use Topics  . Alcohol use: No  . Drug use: No    Home Medications Prior to Admission medications   Medication Sig Start Date End Date Taking? Authorizing Provider  ALPRAZolam Duanne Moron) 0.5 MG tablet Take 1 tablet (0.5 mg total) by mouth 2 (two) times daily as needed for anxiety. Patient not taking: Reported on 01/29/2019 11/20/18   Roxan Hockey, MD  ALPRAZolam Duanne Moron) 0.5 MG tablet Take 1 tablet (0.5 mg total) by mouth at bedtime as needed for anxiety. 06/09/19   Tegeler, Gwenyth Allegra, MD  estradiol (ESTRACE) 1 MG tablet Take 1 mg by mouth daily.  04/21/13   [provider]  FLUoxetine (PROZAC) 40 MG capsule Take 40 mg by mouth daily.    [provider]  gabapentin (NEURONTIN) 300 MG capsule Take 300 mg by mouth every morning.    [provider]  gabapentin (NEURONTIN) 300 MG capsule Take 1  capsule (300 mg total) by mouth every morning. 06/09/19   Tegeler, Gwenyth Allegra, MD  HYDROcodone-acetaminophen (NORCO/VICODIN) 5-325 MG tablet Take 1 tablet by mouth every 8 (eight) hours as needed. Patient not taking: Reported on 01/29/2019 01/10/19   Couture, Cortni S, PA-C  levothyroxine (SYNTHROID, LEVOTHROID) 75 MCG tablet Take 75 mcg by mouth daily before breakfast.  04/09/17   [provider]  ondansetron (ZOFRAN) 4 MG tablet Take 1 tablet (4 mg total) by mouth every 8 (eight) hours as needed for nausea or vomiting. 01/10/19   Couture, Cortni S, PA-C  oxyCODONE (ROXICODONE) 5 MG immediate release tablet Take 1 tablet (5 mg total) by mouth every 6 (six) hours as  needed for severe pain. 01/11/19   Lawyer, Harrell Gave, PA-C  pantoprazole (PROTONIX) 40 MG tablet Take 40 mg by mouth daily.  02/18/12   [provider]  pilocarpine (SALAGEN) 5 MG tablet Take 5 mg by mouth daily.  02/17/15   [provider]  polyethylene glycol (MIRALAX / GLYCOLAX) 17 g packet Take 17 g by mouth daily as needed for mild constipation or moderate constipation. 11/20/18   Roxan Hockey, MD  potassium chloride SA (KLOR-CON) 20 MEQ tablet Take 1 tablet (20 mEq total) by mouth daily. 10/28/19   Antonietta Breach, PA-C  senna-docusate (SENOKOT-S) 8.6-50 MG tablet Take 2 tablets by mouth 2 (two) times daily. 11/20/18   Roxan Hockey, MD  spironolactone (ALDACTONE) 50 MG tablet Take 1 tablet (50 mg total) by mouth daily before breakfast. 11/20/18   Denton Brick, Courage, MD  SUMAtriptan (IMITREX) 100 MG tablet Take 100 mg by mouth as needed for migraine. May repeat in 2 hours if headache persists or recurs.    [provider]  SUMAtriptan (IMITREX) 100 MG tablet Take 1 tablet (100 mg total) by mouth every 2 (two) hours as needed for migraine. May repeat in 2 hours if headache persists or recurs. 06/09/19   Tegeler, Gwenyth Allegra, MD  tamsulosin (FLOMAX) 0.4 MG CAPS capsule Take 1 capsule (0.4 mg total) by  mouth daily. 01/10/19   Couture, Cortni S, PA-C  topiramate (TOPAMAX) 50 MG tablet Take 50 mg by mouth 2 (two) times daily.    [provider]  traMADol (ULTRAM) 50 MG tablet Take 1 tablet (50 mg total) by mouth every 12 (twelve) hours as needed for severe pain. 10/28/19   Antonietta Breach, PA-C  traZODone (DESYREL) 100 MG tablet Take 1 tablet (100 mg total) by mouth at bedtime. 11/20/18   Roxan Hockey, MD  valACYclovir (VALTREX) 500 MG tablet Take 500 mg by mouth daily.    [provider]  eletriptan (RELPAX) 40 MG tablet One tablet by mouth as needed for migraine headache.  If the headache improves and then returns, dose may be repeated after 2 hours have elapsed since first dose (do not exceed 80 mg per day). may repeat in 2 hours if necessary  10/20/11  [provider]  ferrous sulfate 325 (65 FE) MG tablet Take 325 mg by mouth daily with breakfast.  10/20/11  [provider]    Allergies    Acetaminophen  Review of Systems   Review of Systems  Ten systems reviewed and are negative for acute change, except as noted in the HPI.    Physical Exam Updated Vital Signs BP (!) 123/59 (BP Location: Left Arm)   Pulse 75   Temp 98.2 F (36.8 C) (Oral)   Resp 16   SpO2 98%   Physical Exam Vitals and nursing note reviewed.  Constitutional:      General: She is not in acute distress.    Appearance: She is well-developed. She is not diaphoretic.     Comments: Chronically thin/frail appearing. Patient in NAD.  HENT:     Head: Normocephalic and atraumatic.     Comments: No battle's sign or racoon's eyes. No evidence of trauma to scalp. Eyes:     General: No scleral icterus.    Conjunctiva/sclera: Conjunctivae normal.  Cardiovascular:     Rate and Rhythm: Normal rate and regular rhythm.     Pulses: Normal pulses.     Comments: Distal radial pulse 2+ in the RUE Pulmonary:     Effort: Pulmonary  effort is normal. No respiratory distress.     Breath sounds:  No stridor. No wheezing.     Comments: Respirations even and unlabored Abdominal:     Comments: Soft, nontender, nondistended.  Musculoskeletal:     Right shoulder: Swelling (mild) and tenderness present. Normal pulse.       Arms:     Cervical back: Normal range of motion.     Comments: TTP and bruising to the R shoulder. No crepitus or obvious deformity.  Skin:    General: Skin is warm and dry.     Coloration: Skin is not pale.     Findings: No erythema or rash.     Comments: No bruising to chest wall or abdomen.  Neurological:     Mental Status: She is alert and oriented to person, place, and time.     Coordination: Coordination normal.     Comments: GCS 15. Speech is goal oriented. Answers questions appropriately and follows commands. Patient has equal grip strength in the R hand. Sensation to light touch intact. Patient moves extremities without ataxia.   Psychiatric:        Behavior: Behavior normal.     ED Results / Procedures / Treatments   Labs (all labs ordered are listed, but only abnormal results are displayed) Labs Reviewed  I-STAT CHEM 8, ED - Abnormal; Notable for the following components:      Result Value   Potassium 2.7 (*)    All other components within normal limits  ETHANOL    EKG None  Radiology DG Shoulder Right  Result Date: 10/27/2019 CLINICAL DATA:  Pain EXAM: RIGHT SHOULDER - 2+ VIEW COMPARISON:  None. FINDINGS: There is an acute appearing fracture involving the distal right clavicle. There is no glenohumeral dislocation. No acute displaced fracture involving the proximal right humerus. There is probable calcific tendinosis of the supraspinatus. No evidence for displaced right-sided rib fracture involving the visualized right-sided ribs. IMPRESSION: Acute minimally displaced fracture of the distal right clavicle. Electronically Signed   By: Constance Holster M.D.   On: 10/27/2019 22:47    Procedures Procedures (including critical care  time)  Medications Ordered in ED Medications  oxyCODONE (Oxy IR/ROXICODONE) immediate release tablet 5 mg (5 mg Oral Given 10/27/19 2355)  potassium chloride SA (KLOR-CON) CR tablet 40 mEq (40 mEq Oral Given 10/28/19 0215)  potassium chloride 10 mEq in 100 mL IVPB (0 mEq Intravenous Stopped 10/28/19 0334)  ketorolac (TORADOL) 30 MG/ML injection 15 mg (15 mg Intravenous Given 10/28/19 0342)    ED Course  I have reviewed the triage vital signs and the nursing notes.  Pertinent labs & imaging results that were available during my care of the patient were reviewed by me and considered in my medical decision making (see chart for details).  Clinical Course as of Oct 28 438  Mon Oct 28, 2019  0027 Orthostatic vital signs are stable. Patient ambulated in the ED, but was quite unsteady per nursing tech. There is concern for degree of intoxication versus overuse of prescribed medications. Pending chemistry panel, ethanol, UDS.   [KH]    Clinical Course User Index [KH] Beverely Pace   MDM Rules/Calculators/A&P                      69 year old female presents to the emergency department following a fall at home.  Mechanism of the fall is slightly unclear, but patient has been observed multiple hours in the ED without decompensation.  She has stable orthostatic vital signs.  Given supplemental potassium for hypokalemia.  Found to have sustained a minimally displaced lateral right clavicle fracture.  She was placed in a shoulder immobilizer for management.  Plan for outpatient follow-up with her orthopedist.  Encouraged follow-up with the patient's primary care doctor for recheck of her potassium level.  Return precautions discussed and provided. Patient discharged in stable condition with no unaddressed concerns.   Final Clinical Impression(s) / ED Diagnoses Final diagnoses:  Closed displaced fracture of acromial end of right clavicle, initial encounter  Hypokalemia    Rx / DC Orders ED  Discharge Orders         Ordered    traMADol (ULTRAM) 50 MG tablet  Every 12 hours PRN     10/28/19 0333    potassium chloride SA (KLOR-CON) 20 MEQ tablet  Daily     10/28/19 0334           Antonietta Breach, PA-C 10/28/19 0440    Veryl Speak, MD 10/28/19 [phone]

## 2019-10-27 NOTE — ED Triage Notes (Signed)
BIB EMS from home. Pt had mechanical fall landing on R shoulder. Presents with bruising to R shoulder - old and new bruising noted. A/OX4, VSS. No thinners.

## 2019-10-28 LAB — I-STAT CHEM 8, ED
BUN: 17 mg/dL (ref 8–23)
Calcium, Ion: 1.28 mmol/L (ref 1.15–1.40)
Chloride: 104 mmol/L (ref 98–111)
Creatinine, Ser: 0.7 mg/dL (ref 0.44–1.00)
Glucose, Bld: 90 mg/dL (ref 70–99)
HCT: 36 % (ref 36.0–46.0)
Hemoglobin: 12.2 g/dL (ref 12.0–15.0)
Potassium: 2.7 mmol/L — CL (ref 3.5–5.1)
Sodium: 141 mmol/L (ref 135–145)
TCO2: 27 mmol/L (ref 22–32)

## 2019-10-28 LAB — ETHANOL: Alcohol, Ethyl (B): <10 mg/dL (ref <10)

## 2019-10-28 MED ORDER — KETOROLAC TROMETHAMINE 30 MG/ML IJ SOLN
15.0000 mg | Freq: Once | INTRAMUSCULAR | Status: AC
  Administered 2019-10-28: 15 mg via INTRAVENOUS
  Filled 2019-10-28: qty 1

## 2019-10-28 MED ORDER — POTASSIUM CHLORIDE CRYS ER 20 MEQ PO TBCR
40.0000 meq | EXTENDED_RELEASE_TABLET | Freq: Once | ORAL | Status: AC
  Administered 2019-10-28: 02:00:00 40 meq via ORAL
  Filled 2019-10-28: qty 2

## 2019-10-28 MED ORDER — POTASSIUM CHLORIDE 10 MEQ/100ML IV SOLN
10.0000 meq | Freq: Once | INTRAVENOUS | Status: AC
  Administered 2019-10-28: 10 meq via INTRAVENOUS
  Filled 2019-10-28: qty 100

## 2019-10-28 MED ORDER — POTASSIUM CHLORIDE CRYS ER 20 MEQ PO TBCR: 20.0000 meq | EXTENDED_RELEASE_TABLET | Freq: Every day | ORAL | 0 refills | Status: DC

## 2019-10-28 MED ORDER — TRAMADOL HCL 50 MG PO TABS: 50.0000 mg | ORAL_TABLET | Freq: Two times a day (BID) | ORAL | 0 refills | Status: DC | PRN

## 2019-10-28 NOTE — ED Notes (Signed)
Pt verbalized understanding of d/c instructions, follow up care scripts and s/s requiring return to ED. Pt had no further questions at this point. Pt wheelchaired to waiting cab

## 2019-10-28 NOTE — ED Notes (Addendum)
This NT ambulated pt down hallway, pt seems unsteady.

## 2019-10-28 NOTE — Discharge Instructions (Signed)
You are found to have a fracture to your right clavicle/collarbone.  Wear a sling at all times for immobilization and to promote good healing.  We recommend follow-up with your orthopedic doctor to ensure proper healing of this broken bone.  Ice your shoulder 3-4 times per day to limit inflammation.  You may take ibuprofen for pain.  You have been prescribed a short course of tramadol to use for management of severe pain.  Do not drive or drink alcohol after taking this medication as it may make you drowsy and impair your judgment.  Also be wary that this medication may make you increasingly sleepy given that you are also prescribed sedating medications such as Xanax.  Your potassium level was slightly low while in the ED.  Take supplemental potassium for the next 5 days as prescribed.  Have your potassium level rechecked by your primary doctor in approximately 1 week.  Return to the ED for new or concerning symptoms.

## 2019-11-05 DIAGNOSIS — M25511 Pain in right shoulder: Secondary | ICD-10-CM | POA: Diagnosis not present

## 2019-11-11 DIAGNOSIS — S82191S Other fracture of upper end of right tibia, sequela: Secondary | ICD-10-CM | POA: Diagnosis not present

## 2019-11-11 DIAGNOSIS — Z4789 Encounter for other orthopedic aftercare: Secondary | ICD-10-CM | POA: Diagnosis not present

## 2019-11-11 DIAGNOSIS — S82491S Other fracture of shaft of right fibula, sequela: Secondary | ICD-10-CM | POA: Diagnosis not present

## 2019-11-11 DIAGNOSIS — M6281 Muscle weakness (generalized): Secondary | ICD-10-CM | POA: Diagnosis not present

## 2019-12-11 DIAGNOSIS — S82491S Other fracture of shaft of right fibula, sequela: Secondary | ICD-10-CM | POA: Diagnosis not present

## 2019-12-11 DIAGNOSIS — S82191S Other fracture of upper end of right tibia, sequela: Secondary | ICD-10-CM | POA: Diagnosis not present

## 2019-12-11 DIAGNOSIS — Z4789 Encounter for other orthopedic aftercare: Secondary | ICD-10-CM | POA: Diagnosis not present

## 2019-12-11 DIAGNOSIS — M6281 Muscle weakness (generalized): Secondary | ICD-10-CM | POA: Diagnosis not present

## 2019-12-17 DIAGNOSIS — M25511 Pain in right shoulder: Secondary | ICD-10-CM | POA: Diagnosis not present

## 2020-12-22 ENCOUNTER — Inpatient Hospital Stay (HOSPITAL_COMMUNITY)
Admission: EM | Admit: 2020-12-22 | Discharge: 2020-12-25 | DRG: 092 | Disposition: A | Discharge status: Home / Self Care | Payer: PPO | Attending: Family Medicine | Admitting: Family Medicine

## 2020-12-22 ENCOUNTER — Emergency Department (HOSPITAL_COMMUNITY): Payer: PPO

## 2020-12-22 ENCOUNTER — Encounter (HOSPITAL_COMMUNITY): Payer: Self-pay | Admitting: Family Medicine

## 2020-12-22 DIAGNOSIS — E860 Dehydration: Secondary | ICD-10-CM | POA: Diagnosis present

## 2020-12-22 DIAGNOSIS — I10 Essential (primary) hypertension: Secondary | ICD-10-CM | POA: Diagnosis not present

## 2020-12-22 DIAGNOSIS — R55 Syncope and collapse: Secondary | ICD-10-CM | POA: Diagnosis not present

## 2020-12-22 DIAGNOSIS — R4781 Slurred speech: Secondary | ICD-10-CM | POA: Diagnosis not present

## 2020-12-22 DIAGNOSIS — I639 Cerebral infarction, unspecified: Secondary | ICD-10-CM | POA: Diagnosis not present

## 2020-12-22 DIAGNOSIS — G8929 Other chronic pain: Secondary | ICD-10-CM | POA: Diagnosis present

## 2020-12-22 DIAGNOSIS — Z8711 Personal history of peptic ulcer disease: Secondary | ICD-10-CM

## 2020-12-22 DIAGNOSIS — G500 Trigeminal neuralgia: Secondary | ICD-10-CM | POA: Diagnosis present

## 2020-12-22 DIAGNOSIS — Z8249 Family history of ischemic heart disease and other diseases of the circulatory system: Secondary | ICD-10-CM

## 2020-12-22 DIAGNOSIS — N179 Acute kidney failure, unspecified: Secondary | ICD-10-CM | POA: Diagnosis not present

## 2020-12-22 DIAGNOSIS — R29818 Other symptoms and signs involving the nervous system: Secondary | ICD-10-CM | POA: Diagnosis present

## 2020-12-22 DIAGNOSIS — Z20822 Contact with and (suspected) exposure to covid-19: Secondary | ICD-10-CM | POA: Diagnosis not present

## 2020-12-22 DIAGNOSIS — E039 Hypothyroidism, unspecified: Secondary | ICD-10-CM | POA: Diagnosis present

## 2020-12-22 DIAGNOSIS — R29810 Facial weakness: Principal | ICD-10-CM | POA: Diagnosis present

## 2020-12-22 DIAGNOSIS — F419 Anxiety disorder, unspecified: Secondary | ICD-10-CM | POA: Diagnosis not present

## 2020-12-22 DIAGNOSIS — E7800 Pure hypercholesterolemia, unspecified: Secondary | ICD-10-CM | POA: Diagnosis not present

## 2020-12-22 DIAGNOSIS — Z79899 Other long term (current) drug therapy: Secondary | ICD-10-CM

## 2020-12-22 DIAGNOSIS — Z886 Allergy status to analgesic agent status: Secondary | ICD-10-CM | POA: Diagnosis not present

## 2020-12-22 DIAGNOSIS — K219 Gastro-esophageal reflux disease without esophagitis: Secondary | ICD-10-CM | POA: Diagnosis present

## 2020-12-22 DIAGNOSIS — K589 Irritable bowel syndrome without diarrhea: Secondary | ICD-10-CM | POA: Diagnosis present

## 2020-12-22 DIAGNOSIS — N189 Chronic kidney disease, unspecified: Secondary | ICD-10-CM | POA: Diagnosis present

## 2020-12-22 DIAGNOSIS — Z90710 Acquired absence of both cervix and uterus: Secondary | ICD-10-CM

## 2020-12-22 DIAGNOSIS — G43909 Migraine, unspecified, not intractable, without status migrainosus: Secondary | ICD-10-CM | POA: Diagnosis not present

## 2020-12-22 DIAGNOSIS — R531 Weakness: Secondary | ICD-10-CM | POA: Diagnosis not present

## 2020-12-22 DIAGNOSIS — R471 Dysarthria and anarthria: Secondary | ICD-10-CM | POA: Diagnosis present

## 2020-12-22 DIAGNOSIS — F32A Depression, unspecified: Secondary | ICD-10-CM | POA: Diagnosis not present

## 2020-12-22 DIAGNOSIS — Z923 Personal history of irradiation: Secondary | ICD-10-CM | POA: Diagnosis not present

## 2020-12-22 DIAGNOSIS — E876 Hypokalemia: Secondary | ICD-10-CM | POA: Diagnosis present

## 2020-12-22 DIAGNOSIS — I951 Orthostatic hypotension: Secondary | ICD-10-CM | POA: Diagnosis not present

## 2020-12-22 DIAGNOSIS — Z8041 Family history of malignant neoplasm of ovary: Secondary | ICD-10-CM

## 2020-12-22 DIAGNOSIS — R630 Anorexia: Secondary | ICD-10-CM | POA: Diagnosis present

## 2020-12-22 DIAGNOSIS — M810 Age-related osteoporosis without current pathological fracture: Secondary | ICD-10-CM | POA: Diagnosis present

## 2020-12-22 DIAGNOSIS — Z833 Family history of diabetes mellitus: Secondary | ICD-10-CM

## 2020-12-22 DIAGNOSIS — G459 Transient cerebral ischemic attack, unspecified: Secondary | ICD-10-CM | POA: Diagnosis not present

## 2020-12-22 DIAGNOSIS — Z981 Arthrodesis status: Secondary | ICD-10-CM

## 2020-12-22 DIAGNOSIS — R9431 Abnormal electrocardiogram [ECG] [EKG]: Secondary | ICD-10-CM | POA: Diagnosis not present

## 2020-12-22 DIAGNOSIS — J3489 Other specified disorders of nose and nasal sinuses: Secondary | ICD-10-CM | POA: Diagnosis not present

## 2020-12-22 LAB — COMPREHENSIVE METABOLIC PANEL
ALT: 16 U/L (ref 0–44)
AST: 19 U/L (ref 15–41)
Albumin: 4 g/dL (ref 3.5–5.0)
Alkaline Phosphatase: 82 U/L (ref 38–126)
Anion gap: 9 (ref 5–15)
BUN: 10 mg/dL (ref 8–23)
CO2: 20 mmol/L — ABNORMAL LOW (ref 22–32)
Calcium: 9.8 mg/dL (ref 8.9–10.3)
Chloride: 105 mmol/L (ref 98–111)
Creatinine, Ser: 0.88 mg/dL (ref 0.44–1.00)
GFR, Estimated: >60 mL/min (ref >60)
Glucose, Bld: 101 mg/dL — ABNORMAL HIGH (ref 70–99)
Potassium: 2.9 mmol/L — ABNORMAL LOW (ref 3.5–5.1)
Sodium: 134 mmol/L — ABNORMAL LOW (ref 135–145)
Total Bilirubin: 1 mg/dL (ref 0.3–1.2)
Total Protein: 6.5 g/dL (ref 6.5–8.1)

## 2020-12-22 LAB — CBG MONITORING, ED: Glucose-Capillary: 112 mg/dL — ABNORMAL HIGH (ref 70–99)

## 2020-12-22 LAB — CBC
HCT: 45.6 % (ref 36.0–46.0)
Hemoglobin: 15.2 g/dL — ABNORMAL HIGH (ref 12.0–15.0)
MCH: 30 pg (ref 26.0–34.0)
MCHC: 33.3 g/dL (ref 30.0–36.0)
MCV: 89.9 fL (ref 80.0–100.0)
Platelets: 308 10*3/uL (ref 150–400)
RBC: 5.07 MIL/uL (ref 3.87–5.11)
RDW: 12.8 % (ref 11.5–15.5)
WBC: 9.1 10*3/uL (ref 4.0–10.5)
nRBC: 0 % (ref 0.0–0.2)

## 2020-12-22 LAB — I-STAT CHEM 8, ED
BUN: 10 mg/dL (ref 8–23)
Calcium, Ion: 1.28 mmol/L (ref 1.15–1.40)
Chloride: 104 mmol/L (ref 98–111)
Creatinine, Ser: 0.7 mg/dL (ref 0.44–1.00)
Glucose, Bld: 100 mg/dL — ABNORMAL HIGH (ref 70–99)
HCT: 43 % (ref 36.0–46.0)
Hemoglobin: 14.6 g/dL (ref 12.0–15.0)
Potassium: 2.9 mmol/L — ABNORMAL LOW (ref 3.5–5.1)
Sodium: 137 mmol/L (ref 135–145)
TCO2: 21 mmol/L — ABNORMAL LOW (ref 22–32)

## 2020-12-22 LAB — URINALYSIS, ROUTINE W REFLEX MICROSCOPIC
Bilirubin Urine: NEGATIVE
Glucose, UA: NEGATIVE mg/dL
Hgb urine dipstick: NEGATIVE
Ketones, ur: NEGATIVE mg/dL
Leukocytes,Ua: NEGATIVE
Nitrite: NEGATIVE
Protein, ur: NEGATIVE mg/dL
Specific Gravity, Urine: 1.013 (ref 1.005–1.030)
pH: 6 (ref 5.0–8.0)

## 2020-12-22 LAB — DIFFERENTIAL
Abs Immature Granulocytes: 0.02 10*3/uL (ref 0.00–0.07)
Basophils Absolute: 0.1 10*3/uL (ref 0.0–0.1)
Basophils Relative: 1 %
Eosinophils Absolute: 0.1 10*3/uL (ref 0.0–0.5)
Eosinophils Relative: 1 %
Immature Granulocytes: 0 %
Lymphocytes Relative: 23 %
Lymphs Abs: 2.1 10*3/uL (ref 0.7–4.0)
Monocytes Absolute: 0.8 10*3/uL (ref 0.1–1.0)
Monocytes Relative: 9 %
Neutro Abs: 6 10*3/uL (ref 1.7–7.7)
Neutrophils Relative %: 66 %

## 2020-12-22 LAB — RAPID URINE DRUG SCREEN, HOSP PERFORMED
Amphetamines: NOT DETECTED
Barbiturates: NOT DETECTED
Benzodiazepines: POSITIVE — AB
Cocaine: NOT DETECTED
Opiates: NOT DETECTED
Tetrahydrocannabinol: NOT DETECTED

## 2020-12-22 LAB — PROTIME-INR
INR: 1 (ref 0.8–1.2)
Prothrombin Time: 13.4 seconds (ref 11.4–15.2)

## 2020-12-22 LAB — APTT: aPTT: 30 seconds (ref 24–36)

## 2020-12-22 LAB — RESP PANEL BY RT-PCR (FLU A&B, COVID) ARPGX2
Influenza A by PCR: NEGATIVE
Influenza B by PCR: NEGATIVE
SARS Coronavirus 2 by RT PCR: NEGATIVE

## 2020-12-22 LAB — ETHANOL: Alcohol, Ethyl (B): <10 mg/dL (ref <10)

## 2020-12-22 MED ORDER — ACETAMINOPHEN 650 MG RE SUPP: 650.0000 mg | Freq: Four times a day (QID) | RECTAL | Status: DC | PRN

## 2020-12-22 MED ORDER — SODIUM CHLORIDE 0.9 % IV BOLUS
1000.0000 mL | Freq: Once | INTRAVENOUS | Status: AC
  Administered 2020-12-22: 1000 mL via INTRAVENOUS

## 2020-12-22 MED ORDER — SENNOSIDES-DOCUSATE SODIUM 8.6-50 MG PO TABS: 1.0000 | ORAL_TABLET | Freq: Every evening | ORAL | Status: DC | PRN

## 2020-12-22 MED ORDER — ONDANSETRON HCL 4 MG/2ML IJ SOLN: 4.0000 mg | Freq: Four times a day (QID) | INTRAMUSCULAR | Status: DC | PRN

## 2020-12-22 MED ORDER — POTASSIUM CHLORIDE 10 MEQ/100ML IV SOLN
10.0000 meq | INTRAVENOUS | Status: AC
  Administered 2020-12-22 (×2): 10 meq via INTRAVENOUS
  Filled 2020-12-22 (×2): qty 100

## 2020-12-22 MED ORDER — ENOXAPARIN SODIUM 40 MG/0.4ML IJ SOSY
40.0000 mg | PREFILLED_SYRINGE | Freq: Every day | INTRAMUSCULAR | Status: DC
  Administered 2020-12-24 – 2020-12-25 (×2): 40 mg via SUBCUTANEOUS
  Filled 2020-12-22 (×2): qty 0.4

## 2020-12-22 MED ORDER — MAGNESIUM SULFATE IN D5W 1-5 GM/100ML-% IV SOLN
1.0000 g | Freq: Once | INTRAVENOUS | Status: AC
  Administered 2020-12-23: 1 g via INTRAVENOUS
  Filled 2020-12-22: qty 100

## 2020-12-22 MED ORDER — ONDANSETRON HCL 4 MG/2ML IJ SOLN
4.0000 mg | Freq: Once | INTRAMUSCULAR | Status: AC
  Administered 2020-12-22: 4 mg via INTRAVENOUS
  Filled 2020-12-22: qty 2

## 2020-12-22 MED ORDER — ONDANSETRON HCL 4 MG PO TABS: 4.0000 mg | ORAL_TABLET | Freq: Four times a day (QID) | ORAL | Status: DC | PRN

## 2020-12-22 MED ORDER — POTASSIUM CHLORIDE 20 MEQ PO PACK
40.0000 meq | PACK | Freq: Once | ORAL | Status: AC
  Administered 2020-12-22: 40 meq via ORAL
  Filled 2020-12-22 (×2): qty 2

## 2020-12-22 MED ORDER — SODIUM CHLORIDE 0.9 % IV SOLN: INTRAVENOUS | Status: AC

## 2020-12-22 MED ORDER — ACETAMINOPHEN 325 MG PO TABS: 650.0000 mg | ORAL_TABLET | Freq: Four times a day (QID) | ORAL | Status: DC | PRN

## 2020-12-22 NOTE — ED Notes (Signed)
Patient transported to MRI 

## 2020-12-22 NOTE — ED Provider Notes (Signed)
[facility] EMERGENCY DEPARTMENT Provider Note   CSN: [id number] Arrival date & time: 12/22/20  1912  An emergency department physician performed an initial assessment on this suspected stroke patient at 34.  History No chief complaint on file.   Caitlin Irwin is a 70 y.o. female.  70 y.o female with a PMH of Anemia, Ataxia, GERD, Trigeminal neuroalgia presents to the ED via EMS with a chief complaint of slurred speech and facial droop x 1 hour prior to arrival. Patient with 1 episode of bilateral lower extremity weakness associated with lightheadedness numbness along with numbness to the left side of her tongue.  Reports her symptoms began at 5:00, date resolved approximately 30 minutes later, however continues to have some difference in her speech.  Dates that her neighbor was next to her, noted her to be weaker on the right side, she felt her speech has not returned back to baseline while in the ED.  As she arrived 2 hours after this incident, code stroke was activated. On additional history obtained, patient does have a dog but she does report she chases up and down the stairs, however this is not a new activity for patient.  She does have a prior history of trigeminal neuralgia, does have a history of chronic migraines but states that she is not on any medication prophylactically for this.  No focal weakness, no headache, no nausea, vomiting or other complaints.  The history is provided by the patient.       Past Medical History:  Diagnosis Date  . Accidental poisoning by aspirin 09/2014  . Anemia   . Anxiety   . Arthritis    "right knee; right shoulder" (09/15/2015)  . Ataxia 03/24/2014  . Cataract    both eyes  . Chronic lower back pain    scoliosis  . Depression   . GERD (gastroesophageal reflux disease)   . H/O hiatal hernia   . High cholesterol   . History of blood transfusion    "related to OR"  . Hypersomnia   . IBS (irritable bowel syndrome)    . Migraines    "maybe once/week now" (09/15/2015)  . Osteoporosis   . Other constipation   . Parotid tumor   . PONV (postoperative nausea and vomiting)    hx of " getting too much" - 1998, slow to take up by body   . PUD (peptic ulcer disease)   . Trigeminal neuralgia    S/P radiation therapy   . Unspecified hypothyroidism     Patient Active Problem List   Diagnosis Date Noted  . Transient neurologic deficit 12/22/2020  . Anxiety   . Orthostasis   . Closed fracture of proximal end of right tibia and fibula 11/13/2018  . Bilateral temporomandibular joint pain 10/21/2015  . Ulceration, tongue traumatic 10/21/2015  . Epigastric hernia 09/15/2015  . Migraine, intractable 05/28/2015  . Migraine 05/28/2015  . Headache, migraine   . Insomnia   . Emesis   . History of hypothyroidism   . Microcytic hypochromic anemia   . Salicylate poisoning 90/24/0973  . Migraine headache 08/28/2014  . Hypokalemia 08/28/2014  . Salicylate intoxication 08/28/2014  . Aspirin toxicity   . Ataxia 03/24/2014  . Complicated grief 53/29/9242  . Altered mental status, unspecified altered mental status type 10/13/2013  . Polypharmacy 10/13/2013  . Depression 10/13/2013  . Atypical face pain 02/10/2013  . Esophageal reflux 06/12/2012  . Trigeminal neuralgia 11/07/2011  . Trigeminal neuralgia of left side of  face 11/07/2011  . Hx of migraine headaches 10/20/2011  . Dysphagia, unspecified(787.20) 09/26/2011  . Weight gain 09/26/2011  . NAUSEA 02/02/2010  . DIARRHEA 02/02/2010  . CHANGE IN BOWELS 02/02/2010  . ULCER-DUODENAL 11/26/2008  . History of peptic ulcer 11/26/2008  . GASTRITIS 11/26/2008  . Hypothyroidism 10/03/2007  . CONSTIPATION, CHRONIC 10/03/2007  . IRRITABLE BOWEL SYNDROME 10/03/2007  . COLITIS, HX OF 10/03/2007    Past Surgical History:  Procedure Laterality Date  . ABDOMINAL HYSTERECTOMY    . ANKLE FRACTURE SURGERY Left   . ANTERIOR LUMBAR FUSION  04/2010   L4-5; L5-S1  .  BACK SURGERY    . BRAIN SURGERY     parotid tumor removed   . CHOLECYSTECTOMY OPEN    . EPIGASTRIC HERNIA REPAIR  09/15/2015   Procedure: OPEN HERNIA REPAIR EPIGASTRIC ADULT;  Surgeon: Greer Pickerel, MD;  Location: [facility];  Service: General;;  . ESOPHAGOGASTRODUODENOSCOPY  06/12/2012   Procedure: ESOPHAGOGASTRODUODENOSCOPY (EGD);  Surgeon: Lear Ng, MD;  Location: [facility];  Service: Endoscopy;  Laterality: N/A;  . EXTERNAL FIXATION LEG Right 11/14/2018   Procedure: EXTERNAL FIXATION LEG;  Surgeon: Hiram Gash, MD;  Location: [facility];  Service: Orthopedics;  Laterality: Right;  . FRACTURE SURGERY    . gamma knife for trig neuralgia  12/27/11  . [place]   "converted from scope to open during the OR; ligament repair"  . LAMINECTOMY AND MICRODISCECTOMY LUMBAR SPINE  1980   L4-5  . LAPAROSCOPIC EPIGASTRIC HERNIA REPAIR  09/15/2015   open  . ORIF TIBIA PLATEAU Right 11/16/2018   Procedure: OPEN REDUCTION INTERNAL FIXATION (ORIF) TIBIAL PLATEAU;  Surgeon: Hiram Gash, MD;  Location: [facility];  Service: Orthopedics;  Laterality: Right;  . PAROTID GLAND TUMOR EXCISION     benign  . SHOULDER ARTHROSCOPY W/ ROTATOR CUFF REPAIR Right 11-2014  . TRIGEMINAL NERVE DECOMPRESSION     to stop migraines and it did not work      OB History   No obstetric history on file.     Family History  Problem Relation Age of Onset  . Ovarian cancer Mother   . Migraines Mother   . Diabetes Father   . Migraines Brother   . Heart disease Other   . Heart disease Maternal Grandfather   . Colon cancer Neg Hx   . Esophageal cancer Neg Hx     Social History   Tobacco Use  . Smoking status: Never Smoker  . Smokeless tobacco: Never Used  Vaping Use  . Vaping Use: Never used  Substance Use Topics  . Alcohol use: No  . Drug use: No    Home Medications Prior to Admission medications   Medication Sig Start Date End Date Taking? Authorizing Provider   ALPRAZolam Duanne Moron) 0.5 MG tablet Take 1 tablet (0.5 mg total) by mouth 2 (two) times daily as needed for anxiety. Patient not taking: Reported on 01/29/2019 11/20/18   Roxan Hockey, MD  ALPRAZolam Duanne Moron) 0.5 MG tablet Take 1 tablet (0.5 mg total) by mouth at bedtime as needed for anxiety. 06/09/19   Tegeler, Gwenyth Allegra, MD  estradiol (ESTRACE) 1 MG tablet Take 1 mg by mouth daily.  04/21/13   [provider]  FLUoxetine (PROZAC) 40 MG capsule Take 40 mg by mouth daily.    [provider]  gabapentin (NEURONTIN) 300 MG capsule Take 300 mg by mouth every morning.    [provider]  gabapentin (NEURONTIN) 300 MG capsule Take 1 capsule (300 mg total) by mouth every morning. 06/09/19   Tegeler, Gwenyth Allegra, MD  HYDROcodone-acetaminophen (NORCO/VICODIN) 5-325 MG tablet Take 1 tablet by mouth every 8 (eight) hours as needed. Patient not taking: Reported on 01/29/2019 01/10/19   Couture, Cortni S, PA-C  levothyroxine (SYNTHROID, LEVOTHROID) 75 MCG tablet Take 75 mcg by mouth daily before breakfast.  04/09/17   [provider]  ondansetron (ZOFRAN) 4 MG tablet Take 1 tablet (4 mg total) by mouth every 8 (eight) hours as needed for nausea or vomiting. 01/10/19   Couture, Cortni S, PA-C  oxyCODONE (ROXICODONE) 5 MG immediate release tablet Take 1 tablet (5 mg total) by mouth every 6 (six) hours as needed for severe pain. 01/11/19   Lawyer, Harrell Gave, PA-C  pantoprazole (PROTONIX) 40 MG tablet Take 40 mg by mouth daily.  02/18/12   [provider]  pilocarpine (SALAGEN) 5 MG tablet Take 5 mg by mouth daily.  02/17/15   [provider]  polyethylene glycol (MIRALAX / GLYCOLAX) 17 g packet Take 17 g by mouth daily as needed for mild constipation or moderate constipation. 11/20/18   Roxan Hockey, MD  potassium chloride SA (KLOR-CON) 20 MEQ tablet Take 1 tablet (20 mEq total) by mouth daily. 10/28/19   Antonietta Breach, PA-C  senna-docusate (SENOKOT-S) 8.6-50 MG  tablet Take 2 tablets by mouth 2 (two) times daily. 11/20/18   Roxan Hockey, MD  spironolactone (ALDACTONE) 50 MG tablet Take 1 tablet (50 mg total) by mouth daily before breakfast. 11/20/18   Denton Brick, Courage, MD  SUMAtriptan (IMITREX) 100 MG tablet Take 100 mg by mouth as needed for migraine. May repeat in 2 hours if headache persists or recurs.    [provider]  SUMAtriptan (IMITREX) 100 MG tablet Take 1 tablet (100 mg total) by mouth every 2 (two) hours as needed for migraine. May repeat in 2 hours if headache persists or recurs. 06/09/19   Tegeler, Gwenyth Allegra, MD  tamsulosin (FLOMAX) 0.4 MG CAPS capsule Take 1 capsule (0.4 mg total) by mouth daily. 01/10/19   Couture, Cortni S, PA-C  topiramate (TOPAMAX) 50 MG tablet Take 50 mg by mouth 2 (two) times daily.    [provider]  traMADol (ULTRAM) 50 MG tablet Take 1 tablet (50 mg total) by mouth every 12 (twelve) hours as needed for severe pain. 10/28/19   Antonietta Breach, PA-C  traZODone (DESYREL) 100 MG tablet Take 1 tablet (100 mg total) by mouth at bedtime. 11/20/18   Roxan Hockey, MD  valACYclovir (VALTREX) 500 MG tablet Take 500 mg by mouth daily.    [provider]  eletriptan (RELPAX) 40 MG tablet One tablet by mouth as needed for migraine headache.  If the headache improves and then returns, dose may be repeated after 2 hours have elapsed since first dose (do not exceed 80 mg per day). may repeat in 2 hours if necessary  10/20/11  [provider]  ferrous sulfate 325 (65 FE) MG tablet Take 325 mg by mouth daily with breakfast.  10/20/11  [provider]    Allergies    Acetaminophen  Review of Systems   Review of Systems  Constitutional: Negative for fever.  HENT: Negative for sore throat.   Respiratory: Negative for shortness of breath.   Cardiovascular: Negative for chest pain.  Gastrointestinal: Negative for abdominal pain and vomiting.  Genitourinary: Negative for flank pain.   Musculoskeletal: Negative for back pain.  Skin: Negative for pallor  and wound.  Neurological: Positive for facial asymmetry and speech difficulty.  All other systems reviewed and are negative.   Physical Exam Updated Vital Signs BP 135/67   Pulse 67   Temp 98.2 F (36.8 C) (Oral)   Resp 16   SpO2 98%   Physical Exam Vitals and nursing note reviewed.  Constitutional:      Appearance: Normal appearance.  HENT:     Head: Normocephalic and atraumatic.     Nose: Nose normal.     Mouth/Throat:     Mouth: Mucous membranes are moist.  Eyes:     Pupils: Pupils are equal, round, and reactive to light.  Cardiovascular:     Rate and Rhythm: Normal rate.  Pulmonary:     Effort: Pulmonary effort is normal.     Breath sounds: No wheezing.  Abdominal:     General: Abdomen is flat.     Tenderness: There is no abdominal tenderness. There is no right CVA tenderness or left CVA tenderness.  Musculoskeletal:     Cervical back: Normal range of motion and neck supple.  Skin:    General: Skin is warm and dry.  Neurological:     Mental Status: She is alert and oriented to person, place, and time.     Comments: Alert, oriented, thought content appropriate. Speech fluent without evidence of aphasia. Able to follow 2 step commands without difficulty.  Cranial Nerves:  II:  Peripheral visual fields grossly normal, pupils, round, reactive to light III,IV, VI: ptosis not present, extra-ocular motions intact bilaterally  V,VII: smile symmetric, facial light touch sensation equal VIII: hearing grossly normal bilaterally  IX,X: midline uvula rise  XI: bilateral shoulder shrug equal and strong XII: midline tongue extension  Motor:  5/5 in upper and lower extremities bilaterally including strong and equal grip strength and dorsiflexion/plantar flexion Sensory: light touch normal in all extremities.  Cerebellar: normal finger-to-nose with bilateral upper extremities, pronator drift negative        ED Results / Procedures / Treatments   Labs (all labs ordered are listed, but only abnormal results are displayed) Labs Reviewed  CBC - Abnormal; Notable for the following components:      Result Value   Hemoglobin 15.2 (*)    All other components within normal limits  COMPREHENSIVE METABOLIC PANEL - Abnormal; Notable for the following components:   Sodium 134 (*)    Potassium 2.9 (*)    CO2 20 (*)    Glucose, Bld 101 (*)    All other components within normal limits  RAPID URINE DRUG SCREEN, HOSP PERFORMED - Abnormal; Notable for the following components:   Benzodiazepines POSITIVE (*)    All other components within normal limits  URINALYSIS, ROUTINE W REFLEX MICROSCOPIC - Abnormal; Notable for the following components:   APPearance CLOUDY (*)    All other components within normal limits  I-STAT CHEM 8, ED - Abnormal; Notable for the following components:   Potassium 2.9 (*)    Glucose, Bld 100 (*)    TCO2 21 (*)    All other components within normal limits  CBG MONITORING, ED - Abnormal; Notable for the following components:   Glucose-Capillary 112 (*)    All other components within normal limits  RESP PANEL BY RT-PCR (FLU A&B, COVID) ARPGX2  ETHANOL  PROTIME-INR  APTT  DIFFERENTIAL    EKG None  Radiology MR BRAIN WO CONTRAST  Result Date: 12/22/2020 CLINICAL DATA:  Initial evaluation for acute TIA. EXAM: MRI HEAD WITHOUT  CONTRAST TECHNIQUE: Multiplanar, multiecho pulse sequences of the brain and surrounding structures were obtained without intravenous contrast. COMPARISON:  Prior head CT from earlier the same day as well as previous brain MRI from 10/12/2016. FINDINGS: Brain: Cerebral volume within normal limits for age. No significant cerebral white matter disease. No abnormal foci of restricted diffusion to suggest acute or subacute ischemia. Gray-white matter differentiation maintained. No encephalomalacia to suggest chronic cortical infarction. No evidence for  acute or chronic intracranial hemorrhage. No mass lesion, midline shift or mass effect. No hydrocephalus or extra-axial fluid collection. Postoperative changes from prior left suboccipital craniotomy for trigeminal nerve decompression again seen, stable. Vascular: Major intracranial vascular flow voids are maintained. Skull and upper cervical spine: Craniocervical junction within normal limits. Bone marrow signal intensity normal. No focal marrow replacing lesion. No scalp soft tissue abnormality. Prior left suboccipital craniotomy. Sinuses/Orbits: Patient status post ocular lens replacement on the left. Globes and orbital soft tissues demonstrate no acute finding. Mild scattered mucosal thickening noted within the ethmoidal air cells. Paranasal sinuses are otherwise clear. No significant mastoid effusion. Inner ear structures grossly normal. Other: None. IMPRESSION: 1. No acute intracranial abnormality. 2. Stable postoperative changes from prior left suboccipital craniotomy for trigeminal nerve treatment. Otherwise unremarkable brain MRI for age. Electronically Signed   By: Jeannine Boga M.D.   On: 12/22/2020 21:51   CT HEAD CODE STROKE WO CONTRAST  Result Date: 12/22/2020 CLINICAL DATA:  Code stroke. Facial numbness. Right arm numbness. Speech disturbance. EXAM: CT HEAD WITHOUT CONTRAST TECHNIQUE: Contiguous axial images were obtained from the base of the skull through the vertex without intravenous contrast. COMPARISON:  03/30/2017.  10/12/2016. FINDINGS: Brain: Mild age related volume loss. Mild chronic small-vessel change of the hemispheric white matter. No sign of acute infarction. Previous left occipital craniectomy with high-density material along the inter margin of the cranioplasty which is not changed over time. Brainstem and cerebellum appear otherwise unremarkable. Vascular: No abnormal vascular finding. Skull: Left occipital craniectomy and cranioplasty as described above. Sinuses/Orbits:  Normal Other: None ASPECTS ([place] Stroke Program Early CT Score) - Ganglionic level infarction (caudate, lentiform nuclei, internal capsule, insula, M1-M3 cortex): 7 - Supraganglionic infarction (M4-M6 cortex): 3 Total score (0-10 with 10 being normal): 10 IMPRESSION: 1. No acute CT finding. Previous left occipital craniectomy and cranioplasty. Chronic high-density material along the inter margin of the cranioplasty has not changed over time. 2. ASPECTS is 10 3. These results were communicated to Dr. Lorrin Goodell At 8:02 pmon 5/24/2022by text page via the [facility] messaging system. Electronically Signed   By: Nelson Chimes M.D.   On: 12/22/2020 20:04    Procedures Procedures   Medications Ordered in ED Medications  potassium chloride 10 mEq in 100 mL IVPB (10 mEq Intravenous New Bag/Given 12/22/20 2221)  sodium chloride 0.9 % bolus 1,000 mL (1,000 mLs Intravenous New Bag/Given 12/22/20 2220)  potassium chloride (KLOR-CON) packet 40 mEq (40 mEq Oral Given 12/22/20 2218)  ondansetron (ZOFRAN) injection 4 mg (4 mg Intravenous Given 12/22/20 2218)    ED Course  I have reviewed the triage vital signs and the nursing notes.  Pertinent labs & imaging results that were available during my care of the patient were reviewed by me and considered in my medical decision making (see chart for details).  Clinical Course as of 12/22/20 2323  Tue Dec 22, 2020  2202 Potassium(!): 2.9 [JS]  2243 Benzodiazepines(!): POSITIVE [JS]    Clinical Course User Index [JS] Janeece Fitting, PA-C   MDM Rules/Calculators/A&P  Patient presents to the ED with a chief complaint of slurred speech, left tongue numbness, bilateral leg weakness along with dizziness.  Reports her symptoms occurred about an hour prior to arrival in the ED, they lasted about 30 minutes and later resolved.  Does report the dizziness has been ongoing, does report that her speech is not fully back to normal.  She feels somewhat heaviness to her tongue.  As  patient was in the code stroke window, code stroke was activated.  Denies any trauma, falls, blood thinner use.  Patient does report her neighbor witnessed the episode, states that she felt like she was going to fall out.  During my evaluation she is neurologically intact without any focal weakness.  Speech does appear somewhat slurred towards the end of her sentences.  Denies any dysarthria.  Moves all upper and lower extremities.  Given Zofran for nausea.  In addition, patient was evaluated by my attending Dr. Sherry Ruffing who agrees with code stroke activation.  We will also consult neurology for further recommendations.  Interpretation of her lab work reveal a CMP remarkable for hypokalemia, does have a prior history of hypokalemia but this is lower than her baseline.  Creatinine levels within normal limits.  LFTs are unremarkable.  CBC without leukocytosis, hemoglobin slightly increased.  PT and INR within normal limits.  CBG on arrival was 112.  UA without any signs of infection.   CT Head code stroke: 1. No acute CT finding. Previous left occipital craniectomy and  cranioplasty. Chronic high-density material along the inter margin  of the cranioplasty has not changed over time.  2. ASPECTS is 10  3. These results were communicated to Dr. Lorrin Goodell At 8:02 pmon  5/24/2022by text page via the [facility] messaging system.     MRI Brain w/o contrast showed: 1. No acute intracranial abnormality.  2. Stable postoperative changes from prior left suboccipital  craniotomy for trigeminal nerve treatment. Otherwise unremarkable  brain MRI for age.   Patient did have orthostatic vitals obtained during her evaluation to the ED.  These were not remarkable for orthostasis, heart rate did wax and wane between the 60s to 80s.  Patient was provided with 1 L via IV.  While being ambulated by nursing staff to the bathroom, patient was symptomatic began to feel tingling, dizziness again.  She is currently receiving  potassium replacement via IV along with p.o.  Suspect due to symptomatic hypokalemia will need admission to hospitalist service.  Spoke to Dr. Myna Hidalgo who will admit patient for further management.  Patient has been updated on plan and treatment.  Portions of this note were generated with [employer]. Dictation errors may occur despite best attempts at proofreading.  Final Clinical Impression(s) / ED Diagnoses Final diagnoses:  Slurred speech  Hypokalemia    Rx / DC Orders ED Discharge Orders    None       Janeece Fitting, Hershal Coria 12/22/20 2323    Tegeler, Gwenyth Allegra, MD 12/23/20 [phone]

## 2020-12-22 NOTE — Consult Note (Signed)
NEUROLOGY CONSULTATION NOTE   Date of service: Dec 22, 2020 Patient Name: Caitlin Irwin MRN:  [id number] DOB:  28-Aug-1950 Reason for consult: "Stroke code" Requesting Provider: Tegeler, Gwenyth Allegra, * _ _ _   _ __   _ __ _ _  __ __   _ __   __ _  History of Present Illness  Caitlin Irwin is a 70 y.o. female with PMH significant for anemia, anxiety, cataract, low back pain, depression, GERD, migraines, osteoporosis, trigeminal neuralgia, hypothyroidism who presents with an episode of bilateral lower extremity weakness with associated lightheadedness and numbness of the left side of her tongue.  She reports that symptoms started around 1700 on 12/22/2020.  She was chasing her dog up and down the stairs for about 30 minutes prior to start of her symptoms.  She reports that her neighbor Thurmond Butts saw her and felt that she was weaker on the right side but she felt that she had numbness more on the left side of her tongue.  Symptoms lasted for about 30 minutes to an hour and she feels that they are significantly better and mostly resolved.  She denies any arm or leg weakness or numbness, no vision deficit, no double vision, no facial droop, no problems with speech, speech does not sound slurry, no difficulty with arm or leg coordination.  Denies any prior history of strokes.  Does not have diabetes, no hypertension, no hyperlipidemia, does not smoke.  Endorses that she has not been drinking enough fluids and does feel that her lightheadedness and weakness was probably triggered due to her trying to chase her dog up and down the stairs.  No prior similar episode.  ROS   Constitutional Denies weight loss, fever and chills.   HEENT Denies changes in vision and hearing.   Respiratory Denies SOB and cough.   CV Denies palpitations and CP   GI Denies abdominal pain, nausea, vomiting and diarrhea.   GU Denies dysuria and urinary frequency.   MSK Denies myalgia and joint pain.   Skin Denies rash and  pruritus.   Neurological Denies headache, lightheaded but no syncope.   Psychiatric Denies recent changes in mood. Denies anxiety and depression.   Past History   Past Medical History:  Diagnosis Date  . Accidental poisoning by aspirin 09/2014  . Anemia   . Anxiety   . Arthritis    "right knee; right shoulder" (09/15/2015)  . Ataxia 03/24/2014  . Cataract    both eyes  . Chronic lower back pain    scoliosis  . Depression   . GERD (gastroesophageal reflux disease)   . H/O hiatal hernia   . High cholesterol   . History of blood transfusion    "related to OR"  . Hypersomnia   . IBS (irritable bowel syndrome)   . Migraines    "maybe once/week now" (09/15/2015)  . Osteoporosis   . Other constipation   . Parotid tumor   . PONV (postoperative nausea and vomiting)    hx of " getting too much" - 1998, slow to take up by body   . PUD (peptic ulcer disease)   . Trigeminal neuralgia    S/P radiation therapy   . Unspecified hypothyroidism    Past Surgical History:  Procedure Laterality Date  . ABDOMINAL HYSTERECTOMY    . ANKLE FRACTURE SURGERY Left   . ANTERIOR LUMBAR FUSION  04/2010   L4-5; L5-S1  . BACK SURGERY    . BRAIN SURGERY  parotid tumor removed   . CHOLECYSTECTOMY OPEN    . EPIGASTRIC HERNIA REPAIR  09/15/2015   Procedure: OPEN HERNIA REPAIR EPIGASTRIC ADULT;  Surgeon: Greer Pickerel, MD;  Location: [facility];  Service: General;;  . ESOPHAGOGASTRODUODENOSCOPY  06/12/2012   Procedure: ESOPHAGOGASTRODUODENOSCOPY (EGD);  Surgeon: Lear Ng, MD;  Location: [facility];  Service: Endoscopy;  Laterality: N/A;  . EXTERNAL FIXATION LEG Right 11/14/2018   Procedure: EXTERNAL FIXATION LEG;  Surgeon: Hiram Gash, MD;  Location: [facility];  Service: Orthopedics;  Laterality: Right;  . FRACTURE SURGERY    . gamma knife for trig neuralgia  12/27/11  . [place]   "converted from scope to open during the OR; ligament repair"  .  LAMINECTOMY AND MICRODISCECTOMY LUMBAR SPINE  1980   L4-5  . LAPAROSCOPIC EPIGASTRIC HERNIA REPAIR  09/15/2015   open  . ORIF TIBIA PLATEAU Right 11/16/2018   Procedure: OPEN REDUCTION INTERNAL FIXATION (ORIF) TIBIAL PLATEAU;  Surgeon: Hiram Gash, MD;  Location: [facility];  Service: Orthopedics;  Laterality: Right;  . PAROTID GLAND TUMOR EXCISION     benign  . SHOULDER ARTHROSCOPY W/ ROTATOR CUFF REPAIR Right 11-2014  . TRIGEMINAL NERVE DECOMPRESSION     to stop migraines and it did not work    Family History  Problem Relation Age of Onset  . Ovarian cancer Mother   . Migraines Mother   . Diabetes Father   . Migraines Brother   . Heart disease Other   . Heart disease Maternal Grandfather   . Colon cancer Neg Hx   . Esophageal cancer Neg Hx    Social History   Socioeconomic History  . Marital status: Widowed    Spouse name: Not on file  . Number of children: 0  . Years of education: college  . Highest education level: Not on file  Occupational History  . Occupation: disabled  Tobacco Use  . Smoking status: Never Smoker  . Smokeless tobacco: Never Used  Vaping Use  . Vaping Use: Never used  Substance and Sexual Activity  . Alcohol use: No  . Drug use: No  . Sexual activity: Not Currently  Other Topics Concern  . Not on file  Social History Narrative   Patient lives at home alone.    Patient husband past away 07-03-13.   Patient is widowed.   Patient is on disability.    Patient is right handed.   Patient has no children.   Patient is a college grad.   Social Determinants of Health   Financial Resource Strain: Not on file  Food Insecurity: Not on file  Transportation Needs: Not on file  Physical Activity: Not on file  Stress: Not on file  Social Connections: Not on file   Allergies  Allergen Reactions  . Acetaminophen Nausea And Vomiting    Medications  (Not in a hospital admission)    Vitals   Vitals:   12/22/20 1926  BP: (!) 124/54  Pulse: 74   Resp: 16  Temp: 98.2 F (36.8 C)  TempSrc: Oral  SpO2: 98%     There is no height or weight on file to calculate BMI.  Physical Exam   General: Laying comfortably in bed; in no acute distress.  HENT: Normal oropharynx and mucosa. Normal external appearance of ears and nose.  Neck: Supple, no pain or tenderness  CV: No JVD. No peripheral edema.  Pulmonary: Symmetric Chest rise. Normal respiratory  effort.  Abdomen: Soft to touch, non-tender.  Ext: No cyanosis, edema, or deformity  Skin: No rash. Normal palpation of skin.   Musculoskeletal: Normal digits and nails by inspection. No clubbing.   Neurologic Examination  Mental status/Cognition: Alert, oriented to self, place, month and year, good attention.  Speech/language: Fluent, comprehension intact, object naming intact, repetition intact.  Cranial nerves:   CN II Pupils equal and reactive to light, no VF deficits    CN III,IV,VI EOM intact, no gaze preference or deviation, no nystagmus    CN V normal sensation in V1, V2, and V3 segments bilaterally    CN VII no asymmetry, no nasolabial fold flattening    CN VIII normal hearing to speech    CN IX & X normal palatal elevation, no uvular deviation    CN XI 5/5 head turn and 5/5 shoulder shrug bilaterally    CN XII midline tongue protrusion    Motor:  Muscle bulk: normal, tone normal, pronator drift none tremor none Mvmt Root Nerve  Muscle Right Left Comments  SA C5/6 Ax Deltoid 5 5   EF C5/6 Mc Biceps 5 5   EE C6/7/8 Rad Triceps 5 5   WF C6/7 Med FCR     WE C7/8 PIN ECU     F Ab C8/T1 U ADM/FDI 5 5   HF L1/2/3 Fem Illopsoas 5 5   KE L2/3/4 Fem Quad     DF L4/5 D Peron Tib Ant 5 5   PF S1/2 Tibial Grc/Sol 5 5    Reflexes:  Right Left Comments  Pectoralis      Biceps (C5/6) 1 1   Brachioradialis (C5/6) 1 1    Triceps (C6/7) 1 1    Patellar (L3/4) 1 1    Achilles (S1)      Hoffman      Plantar     Jaw jerk    Sensation:  Light touch Intact throughout   Pin  prick    Temperature    Vibration   Proprioception    Coordination/Complex Motor:  - Finger to Nose intact BL - Heel to shin intact BL - Rapid alternating movement are normal - Gait: Deferred.  Labs   CBC:  Recent Labs  Lab 12/22/20 1957  HGB 14.6  HCT 40.8    Basic Metabolic Panel:  Lab Results  Component Value Date   NA 137 12/22/2020   K 2.9 (L) 12/22/2020   CO2 21 (L) 01/09/2019   GLUCOSE 100 (H) 12/22/2020   BUN 10 12/22/2020   CREATININE 0.70 12/22/2020   CALCIUM 9.7 01/09/2019   GFRNONAA 57 (L) 01/09/2019   GFRAA >60 01/09/2019   Lipid Panel: No results found for: LDLCALC HgbA1c:  Lab Results  Component Value Date   HGBA1C (H) 11/25/2009    5.7 (NOTE)                                                                       According to the ADA Clinical Practice Recommendations for 2011, when HbA1c is used as a screening test:   >=6.5%   Diagnostic of Diabetes Mellitus           (if abnormal result  is confirmed)  5.7-6.4%  Increased risk of developing Diabetes Mellitus  References:Diagnosis and Classification of Diabetes Mellitus,Diabetes XENM,0768,08(UPJSR 1):S62-S69 and Standards of Medical Care in         Diabetes - 2011,Diabetes PRXY,5859,29  (Suppl 1):S11-S61.   Urine Drug Screen:     Component Value Date/Time   LABOPIA NONE DETECTED 10/12/2013 2030   Radar Base DETECTED 10/12/2013 2030   LABBENZ POSITIVE (A) 10/12/2013 2030   AMPHETMU POSITIVE (A) 10/12/2013 2030   THCU NONE DETECTED 10/12/2013 2030   LABBARB NONE DETECTED 10/12/2013 2030    Alcohol Level     Component Value Date/Time   ETH <10 10/28/2019 0029    CT Head without contrast: CTH was negative for a large hypodensity concerning for a large territory infarct or hyperdensity concerning for an ICH. Appears to have had prior left occipital cranioplasty.  MRI Brain: Pending   Impression   Caitlin Irwin is a 70 y.o. female with PMH significant for anemia, anxiety,  cataract, low back pain, depression, GERD, migraines, osteoporosis, trigeminal neuralgia, hypothyroidism who presents with an episode of bilateral lower extremity weakness with associated lightheadedness and numbness of the left side of her tongue that started after she was chasing her dog up and down the stairs for over 30 mins. No prior similar episodes. She denies any focal deficits at this time. No headaches. Her neurologic examination is notable for no focal deficit, no aphasia, no encephalopathy.  Althou, tongue numbness may have been a focal finding, the constellation of her symptoms are more concerning for a potential orthostatic episode or just fatigue. She has not weakness of her legs, denies feeling lightheaded currently, no objective numbness of tongue to touch.  Primary Diagnosis:  Orthostatic hypotension Dehydration  Recommendations  - MRI Brain without contrast - given tongue numbness may potentially be a TIA - Orthostatic vitals x 1 - Would hold off on full stroke workup until MRI shows an acute stroke. ______________________________________________________________________   Thank you for the opportunity to take part in the care of this patient. If you have any further questions, please contact the neurology consultation attending.  Signed,  [place] Pager Number [phone] _ _ _   _ __   _ __ _ _  __ __   _ __   __ _

## 2020-12-22 NOTE — H&P (Signed)
History and Physical    Caitlin Irwin EPP:[id number] DOB: 12-24-1950 DOA: 12/22/2020  PCP: Levin Erp, MD   Patient coming from: Home   Chief Complaint: Facial droop, slurred speech, tongue numbness, lightheadedness   HPI: Caitlin Irwin is a 70 y.o. female with medical history significant for trigeminal neuralgia, anxiety, chronic pain, hypothyroidism, now presenting to the emergency department with near syncope, right facial droop, slurred speech, and numbness involving the left tongue.  The patient reports that she had been in her usual state last night, was chasing her dog up and down the stairs today, and then became lightheaded and felt as though she might pass out but did not actually lose consciousness.  And neighbor noted that she appeared to have a right facial droop and slurred speech and recommended evaluation in the emergency department.  Patient noted that the left side of her tongue seem to be numb.  She denies any recent vomiting or diarrhea but does not feel that she has been drinking enough fluids and has had a poor appetite though that seems to be a longstanding issue.  She has had surgery for trigeminal neuralgia but continues to have severe pain in the mid left face that she complains of now.  ED Course: Upon arrival to the ED, patient is found to be afebrile, saturating well on room air, and with 49 mmHg drop in systolic blood pressure upon standing.  EKG features a sinus rhythm with LAFB.  CT is negative for acute finding.  Chemistry panel notable for potassium 2.9.  CBC unremarkable.  Neurology evaluated the patient in the emergency department and recommended MRI brain which was normal aside from stable postoperative changes.  Patient was given a liter of saline, IV and oral potassium, and hospitalists were asked to admit.  Review of Systems:  All other systems reviewed and apart from HPI, are negative.  Past Medical History:  Diagnosis Date  . Accidental poisoning  by aspirin 09/2014  . Anemia   . Anxiety   . Arthritis    "right knee; right shoulder" (09/15/2015)  . Ataxia 03/24/2014  . Cataract    both eyes  . Chronic lower back pain    scoliosis  . Depression   . GERD (gastroesophageal reflux disease)   . H/O hiatal hernia   . High cholesterol   . History of blood transfusion    "related to OR"  . Hypersomnia   . IBS (irritable bowel syndrome)   . Migraines    "maybe once/week now" (09/15/2015)  . Osteoporosis   . Other constipation   . Parotid tumor   . PONV (postoperative nausea and vomiting)    hx of " getting too much" - 1998, slow to take up by body   . PUD (peptic ulcer disease)   . Trigeminal neuralgia    S/P radiation therapy   . Unspecified hypothyroidism     Past Surgical History:  Procedure Laterality Date  . ABDOMINAL HYSTERECTOMY    . ANKLE FRACTURE SURGERY Left   . ANTERIOR LUMBAR FUSION  04/2010   L4-5; L5-S1  . BACK SURGERY    . BRAIN SURGERY     parotid tumor removed   . CHOLECYSTECTOMY OPEN    . EPIGASTRIC HERNIA REPAIR  09/15/2015   Procedure: OPEN HERNIA REPAIR EPIGASTRIC ADULT;  Surgeon: Greer Pickerel, MD;  Location: [facility];  Service: General;;  . ESOPHAGOGASTRODUODENOSCOPY  06/12/2012   Procedure: ESOPHAGOGASTRODUODENOSCOPY (EGD);  Surgeon: Lear Ng, MD;  Location: [facility];  Service: Endoscopy;  Laterality: N/A;  . EXTERNAL FIXATION LEG Right 11/14/2018   Procedure: EXTERNAL FIXATION LEG;  Surgeon: Hiram Gash, MD;  Location: [facility];  Service: Orthopedics;  Laterality: Right;  . FRACTURE SURGERY    . gamma knife for trig neuralgia  12/27/11  . [place]   "converted from scope to open during the OR; ligament repair"  . LAMINECTOMY AND MICRODISCECTOMY LUMBAR SPINE  1980   L4-5  . LAPAROSCOPIC EPIGASTRIC HERNIA REPAIR  09/15/2015   open  . ORIF TIBIA PLATEAU Right 11/16/2018   Procedure: OPEN REDUCTION INTERNAL FIXATION (ORIF) TIBIAL PLATEAU;  Surgeon:  Hiram Gash, MD;  Location: [facility];  Service: Orthopedics;  Laterality: Right;  . PAROTID GLAND TUMOR EXCISION     benign  . SHOULDER ARTHROSCOPY W/ ROTATOR CUFF REPAIR Right 11-2014  . TRIGEMINAL NERVE DECOMPRESSION     to stop migraines and it did not work     Social History:   reports that she has never smoked. She has never used smokeless tobacco. She reports that she does not drink alcohol and does not use drugs.  Allergies  Allergen Reactions  . Acetaminophen Nausea And Vomiting    Family History  Problem Relation Age of Onset  . Ovarian cancer Mother   . Migraines Mother   . Diabetes Father   . Migraines Brother   . Heart disease Other   . Heart disease Maternal Grandfather   . Colon cancer Neg Hx   . Esophageal cancer Neg Hx      Prior to Admission medications   Medication Sig Start Date End Date Taking? Authorizing Provider  ALPRAZolam Duanne Moron) 0.5 MG tablet Take 1 tablet (0.5 mg total) by mouth 2 (two) times daily as needed for anxiety. Patient not taking: Reported on 01/29/2019 11/20/18   Roxan Hockey, MD  ALPRAZolam Duanne Moron) 0.5 MG tablet Take 1 tablet (0.5 mg total) by mouth at bedtime as needed for anxiety. 06/09/19   Tegeler, Gwenyth Allegra, MD  estradiol (ESTRACE) 1 MG tablet Take 1 mg by mouth daily.  04/21/13   [provider]  FLUoxetine (PROZAC) 40 MG capsule Take 40 mg by mouth daily.    [provider]  gabapentin (NEURONTIN) 300 MG capsule Take 300 mg by mouth every morning.    [provider]  gabapentin (NEURONTIN) 300 MG capsule Take 1 capsule (300 mg total) by mouth every morning. 06/09/19   Tegeler, Gwenyth Allegra, MD  HYDROcodone-acetaminophen (NORCO/VICODIN) 5-325 MG tablet Take 1 tablet by mouth every 8 (eight) hours as needed. Patient not taking: Reported on 01/29/2019 01/10/19   Couture, Cortni S, PA-C  levothyroxine (SYNTHROID, LEVOTHROID) 75 MCG tablet Take 75 mcg by mouth daily before breakfast.  04/09/17   [provider]  ondansetron (ZOFRAN) 4 MG tablet Take 1 tablet (4 mg total) by mouth every 8 (eight) hours as needed for nausea or vomiting. 01/10/19   Couture, Cortni S, PA-C  oxyCODONE (ROXICODONE) 5 MG immediate release tablet Take 1 tablet (5 mg total) by mouth every 6 (six) hours as needed for severe pain. 01/11/19   Lawyer, Harrell Gave, PA-C  pantoprazole (PROTONIX) 40 MG tablet Take 40 mg by mouth daily.  02/18/12   [provider]  pilocarpine (SALAGEN) 5 MG tablet Take 5 mg by mouth daily.  02/17/15   [provider]  polyethylene glycol (MIRALAX / GLYCOLAX) 17 g packet Take 17 g by mouth  daily as needed for mild constipation or moderate constipation. 11/20/18   Roxan Hockey, MD  potassium chloride SA (KLOR-CON) 20 MEQ tablet Take 1 tablet (20 mEq total) by mouth daily. 10/28/19   Antonietta Breach, PA-C  senna-docusate (SENOKOT-S) 8.6-50 MG tablet Take 2 tablets by mouth 2 (two) times daily. 11/20/18   Roxan Hockey, MD  spironolactone (ALDACTONE) 50 MG tablet Take 1 tablet (50 mg total) by mouth daily before breakfast. 11/20/18   Denton Brick, Courage, MD  SUMAtriptan (IMITREX) 100 MG tablet Take 100 mg by mouth as needed for migraine. May repeat in 2 hours if headache persists or recurs.    [provider]  SUMAtriptan (IMITREX) 100 MG tablet Take 1 tablet (100 mg total) by mouth every 2 (two) hours as needed for migraine. May repeat in 2 hours if headache persists or recurs. 06/09/19   Tegeler, Gwenyth Allegra, MD  tamsulosin (FLOMAX) 0.4 MG CAPS capsule Take 1 capsule (0.4 mg total) by mouth daily. 01/10/19   Couture, Cortni S, PA-C  topiramate (TOPAMAX) 50 MG tablet Take 50 mg by mouth 2 (two) times daily.    [provider]  traMADol (ULTRAM) 50 MG tablet Take 1 tablet (50 mg total) by mouth every 12 (twelve) hours as needed for severe pain. 10/28/19   Antonietta Breach, PA-C  traZODone (DESYREL) 100 MG tablet Take 1 tablet (100 mg total) by mouth at bedtime. 11/20/18    Roxan Hockey, MD  valACYclovir (VALTREX) 500 MG tablet Take 500 mg by mouth daily.    [provider]  eletriptan (RELPAX) 40 MG tablet One tablet by mouth as needed for migraine headache.  If the headache improves and then returns, dose may be repeated after 2 hours have elapsed since first dose (do not exceed 80 mg per day). may repeat in 2 hours if necessary  10/20/11  [provider]  ferrous sulfate 325 (65 FE) MG tablet Take 325 mg by mouth daily with breakfast.  10/20/11  [provider]    Physical Exam: Vitals:   12/22/20 2145 12/22/20 2225 12/22/20 2230 12/22/20 2245  BP: 119/60 136/83 130/85 135/67  Pulse: 80 76 69 67  Resp: 16 13 14 16   Temp:      TempSrc:      SpO2: 98% 98% 97% 98%    Constitutional: NAD, calm  Eyes: PERTLA, lids and conjunctivae normal ENMT: Mucous membranes are moist. Posterior pharynx clear of any exudate or lesions.   Neck:  supple, no masses  Respiratory: no wheezing, no crackles. No accessory muscle use.  Cardiovascular: S1 & S2 heard, regular rate and rhythm. No extremity edema. No significant JVD. Abdomen: No distension, no tenderness, soft. Bowel sounds active.  Musculoskeletal: no clubbing / cyanosis. No joint deformity upper and lower extremities.   Skin: no significant rashes, lesions, ulcers. Warm, dry, well-perfused. Neurologic: CN 2-12 grossly intact. Sensation intact. Strength 5/5 in all 4 limbs.  Psychiatric: Alert and oriented to person, place, and situation. Pleasant and cooperative.    Labs and Imaging on Admission: I have personally reviewed following labs and imaging studies  CBC: Recent Labs  Lab 12/22/20 1936 12/22/20 1957  WBC 9.1  --   NEUTROABS 6.0  --   HGB 15.2* 14.6  HCT 45.6 43.0  MCV 89.9  --   PLT 308  --    Basic Metabolic Panel: Recent Labs  Lab 12/22/20 1936 12/22/20 1957  NA 134* 137  K 2.9* 2.9*  CL 105 104  CO2 20*  --  GLUCOSE 101* 100*  BUN 10 10  CREATININE 0.88  0.70  CALCIUM 9.8  --    GFR: CrCl cannot be calculated (Unknown ideal weight.). Liver Function Tests: Recent Labs  Lab 12/22/20 1936  AST 19  ALT 16  ALKPHOS 82  BILITOT 1.0  PROT 6.5  ALBUMIN 4.0   No results for input(s): LIPASE, AMYLASE in the last 168 hours. No results for input(s): AMMONIA in the last 168 hours. Coagulation Profile: Recent Labs  Lab 12/22/20 1936  INR 1.0   Cardiac Enzymes: No results for input(s): CKTOTAL, CKMB, CKMBINDEX, TROPONINI in the last 168 hours. BNP (last 3 results) No results for input(s): PROBNP in the last 8760 hours. HbA1C: No results for input(s): HGBA1C in the last 72 hours. CBG: Recent Labs  Lab 12/22/20 1938  GLUCAP 112*   Lipid Profile: No results for input(s): CHOL, HDL, LDLCALC, TRIG, CHOLHDL, LDLDIRECT in the last 72 hours. Thyroid Function Tests: No results for input(s): TSH, T4TOTAL, FREET4, T3FREE, THYROIDAB in the last 72 hours. Anemia Panel: No results for input(s): VITAMINB12, FOLATE, FERRITIN, TIBC, IRON, RETICCTPCT in the last 72 hours. Urine analysis:    Component Value Date/Time   COLORURINE YELLOW 12/22/2020 1936   APPEARANCEUR CLOUDY (A) 12/22/2020 1936   LABSPEC 1.013 12/22/2020 1936   PHURINE 6.0 12/22/2020 1936   GLUCOSEU NEGATIVE 12/22/2020 1936   HGBUR NEGATIVE 12/22/2020 1936   BILIRUBINUR NEGATIVE 12/22/2020 1936   KETONESUR NEGATIVE 12/22/2020 1936   PROTEINUR NEGATIVE 12/22/2020 1936   UROBILINOGEN 0.2 08/28/2014 1930   NITRITE NEGATIVE 12/22/2020 1936   LEUKOCYTESUR NEGATIVE 12/22/2020 1936   Sepsis Labs: @LABRCNTIP (procalcitonin:4,lacticidven:4) ) Recent Results (from the past 240 hour(s))  Resp Panel by RT-PCR (Flu A&B, Covid) Nasopharyngeal Swab     Status: None   Collection Time: 12/22/20  7:37 PM   Specimen: Nasopharyngeal Swab; Nasopharyngeal(NP) swabs in vial transport medium  Result Value Ref Range Status   SARS Coronavirus 2 by RT PCR NEGATIVE NEGATIVE Final    Comment:  (NOTE) SARS-CoV-2 target nucleic acids are NOT DETECTED.  The SARS-CoV-2 RNA is generally detectable in upper respiratory specimens during the acute phase of infection. The lowest concentration of SARS-CoV-2 viral copies this assay can detect is 138 copies/mL. A negative result does not preclude SARS-Cov-2 infection and should not be used as the sole basis for treatment or other patient management decisions. A negative result may occur with  improper specimen collection/handling, submission of specimen other than nasopharyngeal swab, presence of viral mutation(s) within the areas targeted by this assay, and inadequate number of viral copies(<138 copies/mL). A negative result must be combined with clinical observations, patient history, and epidemiological information. The expected result is Negative.  Fact Sheet for Patients:  EntrepreneurPulse.com.au  Fact Sheet for Healthcare Providers:  IncredibleEmployment.be  This test is no t yet approved or cleared by the [place] FDA and  has been authorized for detection and/or diagnosis of SARS-CoV-2 by FDA under an Emergency Use Authorization (EUA). This EUA will remain  in effect (meaning this test can be used) for the duration of the COVID-19 declaration under Section 564(b)(1) of the Act, 21 U.S.C.section 360bbb-3(b)(1), unless the authorization is terminated  or revoked sooner.       Influenza A by PCR NEGATIVE NEGATIVE Final   Influenza B by PCR NEGATIVE NEGATIVE Final    Comment: (NOTE) The Xpert Xpress SARS-CoV-2/FLU/RSV plus assay is intended as an aid in the diagnosis of influenza from Nasopharyngeal swab specimens and should not be  used as a sole basis for treatment. Nasal washings and aspirates are unacceptable for Xpert Xpress SARS-CoV-2/FLU/RSV testing.  Fact Sheet for Patients: EntrepreneurPulse.com.au  Fact Sheet for Healthcare  Providers: IncredibleEmployment.be  This test is not yet approved or cleared by the [place] FDA and has been authorized for detection and/or diagnosis of SARS-CoV-2 by FDA under an Emergency Use Authorization (EUA). This EUA will remain in effect (meaning this test can be used) for the duration of the COVID-19 declaration under Section 564(b)(1) of the Act, 21 U.S.C. section 360bbb-3(b)(1), unless the authorization is terminated or revoked.  Performed at [facility], [place] [address] [place], [place] [address]      Radiological Exams on Admission: MR BRAIN WO CONTRAST  Result Date: 12/22/2020 CLINICAL DATA:  Initial evaluation for acute TIA. EXAM: MRI HEAD WITHOUT CONTRAST TECHNIQUE: Multiplanar, multiecho pulse sequences of the brain and surrounding structures were obtained without intravenous contrast. COMPARISON:  Prior head CT from earlier the same day as well as previous brain MRI from 10/12/2016. FINDINGS: Brain: Cerebral volume within normal limits for age. No significant cerebral white matter disease. No abnormal foci of restricted diffusion to suggest acute or subacute ischemia. Gray-white matter differentiation maintained. No encephalomalacia to suggest chronic cortical infarction. No evidence for acute or chronic intracranial hemorrhage. No mass lesion, midline shift or mass effect. No hydrocephalus or extra-axial fluid collection. Postoperative changes from prior left suboccipital craniotomy for trigeminal nerve decompression again seen, stable. Vascular: Major intracranial vascular flow voids are maintained. Skull and upper cervical spine: Craniocervical junction within normal limits. Bone marrow signal intensity normal. No focal marrow replacing lesion. No scalp soft tissue abnormality. Prior left suboccipital craniotomy. Sinuses/Orbits: Patient status post ocular lens replacement on the left. Globes and orbital soft tissues demonstrate no acute finding.  Mild scattered mucosal thickening noted within the ethmoidal air cells. Paranasal sinuses are otherwise clear. No significant mastoid effusion. Inner ear structures grossly normal. Other: None. IMPRESSION: 1. No acute intracranial abnormality. 2. Stable postoperative changes from prior left suboccipital craniotomy for trigeminal nerve treatment. Otherwise unremarkable brain MRI for age. Electronically Signed   By: Jeannine Boga M.D.   On: 12/22/2020 21:51   CT HEAD CODE STROKE WO CONTRAST  Result Date: 12/22/2020 CLINICAL DATA:  Code stroke. Facial numbness. Right arm numbness. Speech disturbance. EXAM: CT HEAD WITHOUT CONTRAST TECHNIQUE: Contiguous axial images were obtained from the base of the skull through the vertex without intravenous contrast. COMPARISON:  03/30/2017.  10/12/2016. FINDINGS: Brain: Mild age related volume loss. Mild chronic small-vessel change of the hemispheric white matter. No sign of acute infarction. Previous left occipital craniectomy with high-density material along the inter margin of the cranioplasty which is not changed over time. Brainstem and cerebellum appear otherwise unremarkable. Vascular: No abnormal vascular finding. Skull: Left occipital craniectomy and cranioplasty as described above. Sinuses/Orbits: Normal Other: None ASPECTS ([place] Stroke Program Early CT Score) - Ganglionic level infarction (caudate, lentiform nuclei, internal capsule, insula, M1-M3 cortex): 7 - Supraganglionic infarction (M4-M6 cortex): 3 Total score (0-10 with 10 being normal): 10 IMPRESSION: 1. No acute CT finding. Previous left occipital craniectomy and cranioplasty. Chronic high-density material along the inter margin of the cranioplasty has not changed over time. 2. ASPECTS is 10 3. These results were communicated to Dr. Lorrin Goodell At 8:02 pmon 5/24/2022by text page via the [facility] messaging system. Electronically Signed   By: Nelson Chimes M.D.   On: 12/22/2020 20:04    EKG:  Independently reviewed. Sinus rhythm, LAFB.  Assessment/Plan   1. Near-syncope; orthostasis  - Presents with near-syncope and reported right facial droop, slurred speech, and numbness on left side of tongue  - She continues to be lightheaded/near-syncopal on standing and had 49 mmHg drop in SBP on standing but other complaints resolved prior to admission  - Continue IVF hydration, repeat orthostatic vitals in am, consider compression stockings and/or abdominal binder    2. Hypokalemia  - Serum potassium is 2.9, replaced with 40 mEq oral and 10 mEq IV potassium in ED  - Continue 20 mEq oral potassium daily, repeat chem panel with mag level in am    3. Transient facial droop and dysarthria  - Resolved prior to arrival, appreciate neurology assessments and recommendations, no acute findings on MRI brain   4. Trigeminal neuralgia  - Continue oxcarbazepine and topiramate    5. Hypothyroidism   - Continue Synthroid     DVT prophylaxis: Lovenox  Code Status: Full  Level of Care: Level of care: Telemetry Medical Family Communication: None present  Disposition Plan:  Patient is from: Home  Anticipated d/c is to: TBD Anticipated d/c date is: 12/23/20 Patient currently: pending potassium replacement, IVF hydration, repeat orthostatic vitals, PT eval and tx  Consults called: Neurology evaluated patient in ED  Admission status: Observation     Vianne Bulls, MD [facility]  12/23/2020, 12:23 AM

## 2020-12-22 NOTE — ED Notes (Signed)
IV potassium stopped per pt request. Admitting MD made aware.

## 2020-12-22 NOTE — ED Triage Notes (Signed)
Pt bib ems from home with reports of witnessed [id number] pt with R sided facial droop and slurred sppech lasting approx 15 minutes. Pt endorses feeling near syncopal during event. No hx CVA. On scene pt with no neuro deficits.  110/80 HR 74 16RR 99% RA

## 2020-12-22 NOTE — ED Notes (Signed)
Pt ambulatory to bathroom with standby assist.

## 2020-12-22 NOTE — ED Notes (Signed)
Pt now states dizziness onset with the other symptoms with some improvement in dizziness.

## 2020-12-23 DIAGNOSIS — R29818 Other symptoms and signs involving the nervous system: Secondary | ICD-10-CM | POA: Diagnosis not present

## 2020-12-23 DIAGNOSIS — R55 Syncope and collapse: Secondary | ICD-10-CM | POA: Diagnosis not present

## 2020-12-23 DIAGNOSIS — E876 Hypokalemia: Secondary | ICD-10-CM | POA: Diagnosis not present

## 2020-12-23 DIAGNOSIS — R4781 Slurred speech: Secondary | ICD-10-CM | POA: Insufficient documentation

## 2020-12-23 DIAGNOSIS — E039 Hypothyroidism, unspecified: Secondary | ICD-10-CM | POA: Diagnosis not present

## 2020-12-23 LAB — CBC
HCT: 40.3 % (ref 36.0–46.0)
Hemoglobin: 13.3 g/dL (ref 12.0–15.0)
MCH: 29.7 pg (ref 26.0–34.0)
MCHC: 33 g/dL (ref 30.0–36.0)
MCV: 90 fL (ref 80.0–100.0)
Platelets: 248 10*3/uL (ref 150–400)
RBC: 4.48 MIL/uL (ref 3.87–5.11)
RDW: 12.6 % (ref 11.5–15.5)
WBC: 7.1 10*3/uL (ref 4.0–10.5)
nRBC: 0 % (ref 0.0–0.2)

## 2020-12-23 LAB — BASIC METABOLIC PANEL
Anion gap: 6 (ref 5–15)
BUN: 7 mg/dL — ABNORMAL LOW (ref 8–23)
CO2: 21 mmol/L — ABNORMAL LOW (ref 22–32)
Calcium: 9.2 mg/dL (ref 8.9–10.3)
Chloride: 109 mmol/L (ref 98–111)
Creatinine, Ser: 0.73 mg/dL (ref 0.44–1.00)
GFR, Estimated: >60 mL/min (ref >60)
Glucose, Bld: 97 mg/dL (ref 70–99)
Potassium: 3.8 mmol/L (ref 3.5–5.1)
Sodium: 136 mmol/L (ref 135–145)

## 2020-12-23 LAB — MAGNESIUM: Magnesium: 2.4 mg/dL (ref 1.7–2.4)

## 2020-12-23 MED ORDER — FLUOXETINE HCL 20 MG PO CAPS
40.0000 mg | ORAL_CAPSULE | Freq: Every day | ORAL | Status: DC
  Administered 2020-12-23 – 2020-12-25 (×3): 40 mg via ORAL
  Filled 2020-12-23 (×3): qty 2

## 2020-12-23 MED ORDER — OXYCODONE HCL 5 MG PO TABS
5.0000 mg | ORAL_TABLET | Freq: Four times a day (QID) | ORAL | Status: DC | PRN
  Administered 2020-12-23 – 2020-12-24 (×3): 5 mg via ORAL
  Filled 2020-12-23 (×2): qty 1

## 2020-12-23 MED ORDER — CELECOXIB 100 MG PO CAPS
100.0000 mg | ORAL_CAPSULE | Freq: Two times a day (BID) | ORAL | Status: DC
  Administered 2020-12-23: 100 mg via ORAL
  Filled 2020-12-23 (×2): qty 1

## 2020-12-23 MED ORDER — LEVOTHYROXINE SODIUM 75 MCG PO TABS
75.0000 ug | ORAL_TABLET | Freq: Every day | ORAL | Status: DC
  Administered 2020-12-23 – 2020-12-25 (×3): 75 ug via ORAL
  Filled 2020-12-23 (×3): qty 1

## 2020-12-23 MED ORDER — GABAPENTIN 100 MG PO CAPS
100.0000 mg | ORAL_CAPSULE | Freq: Three times a day (TID) | ORAL | Status: DC
  Administered 2020-12-23 – 2020-12-25 (×8): 100 mg via ORAL
  Filled 2020-12-23 (×8): qty 1

## 2020-12-23 MED ORDER — OXYCODONE HCL 5 MG PO TABS
5.0000 mg | ORAL_TABLET | Freq: Once | ORAL | Status: AC
  Administered 2020-12-23: 5 mg via ORAL
  Filled 2020-12-23: qty 1

## 2020-12-23 MED ORDER — POTASSIUM CHLORIDE CRYS ER 20 MEQ PO TBCR
20.0000 meq | EXTENDED_RELEASE_TABLET | Freq: Every day | ORAL | Status: DC
  Administered 2020-12-23 – 2020-12-24 (×3): 20 meq via ORAL
  Filled 2020-12-23 (×3): qty 1

## 2020-12-23 MED ORDER — PANTOPRAZOLE SODIUM 40 MG PO TBEC
40.0000 mg | DELAYED_RELEASE_TABLET | Freq: Every day | ORAL | Status: DC
  Administered 2020-12-23 – 2020-12-25 (×3): 40 mg via ORAL
  Filled 2020-12-23 (×3): qty 1

## 2020-12-23 MED ORDER — OXCARBAZEPINE 150 MG PO TABS
150.0000 mg | ORAL_TABLET | Freq: Every day | ORAL | Status: DC
  Administered 2020-12-24 – 2020-12-25 (×2): 150 mg via ORAL
  Filled 2020-12-23 (×4): qty 1

## 2020-12-23 MED ORDER — IBUPROFEN 200 MG PO TABS
400.0000 mg | ORAL_TABLET | Freq: Four times a day (QID) | ORAL | Status: DC | PRN
  Administered 2020-12-23 (×3): 400 mg via ORAL
  Filled 2020-12-23 (×2): qty 1
  Filled 2020-12-23 (×2): qty 2

## 2020-12-23 MED ORDER — TOPIRAMATE 25 MG PO TABS
50.0000 mg | ORAL_TABLET | Freq: Two times a day (BID) | ORAL | Status: DC
  Administered 2020-12-23 – 2020-12-25 (×6): 50 mg via ORAL
  Filled 2020-12-23 (×6): qty 2

## 2020-12-23 NOTE — Progress Notes (Signed)
Physical Therapy Evaluation Patient Details Name: Caitlin Irwin MRN: [id number] DOB: June 07, 1951 Today's Date: 12/23/2020   History of Present Illness  Pt is a 70 y/o female admitted 5/24 secondary to syncopal episode and R facial droop with slurred speech. MRI negative. PMH includes trigeminal neuralgia and GERD.  Clinical Impression  Pt admitted secondary to problem above with deficits below. Pt reporting mild dizziness during ambulation, but did not seem to affect tolerance. BP WFL. Requiring min guard A for safety during mobility tasks. No overt LOB noted. Pt currently reports she does not feel like she will need follow up PT at home and reports she has good neighbors that check on her often. Will continue to follow acutely.     Follow Up Recommendations No PT follow up    Equipment Recommendations  None recommended by PT    Recommendations for Other Services       Precautions / Restrictions Precautions Precautions: Fall Restrictions Weight Bearing Restrictions: No      Mobility  Bed Mobility Overal bed mobility: Needs Assistance Bed Mobility: Supine to Sit;Sit to Supine     Supine to sit: Supervision Sit to supine: Supervision   General bed mobility comments: Supervision for safety and line management.    Transfers Overall transfer level: Needs assistance Equipment used: None Transfers: Sit to/from Stand Sit to Stand: Min guard         General transfer comment: Min guard for safety to perform transfers from higher stretcher height.  Ambulation/Gait Ambulation/Gait assistance: Min guard Gait Distance (Feet): 125 Feet Assistive device: 1 person hand held assist Gait Pattern/deviations: Step-through pattern;Decreased stride length Gait velocity: Decreased   General Gait Details: Min guard for safety. Mild lightheadedness reported. BP WFL after ambulation. No overt LOB noted.  Stairs            Wheelchair Mobility    Modified Rankin (Stroke  Patients Only)       Balance Overall balance assessment: Needs assistance Sitting-balance support: No upper extremity supported;Feet supported Sitting balance-Leahy Scale: Good     Standing balance support: No upper extremity supported;Single extremity supported Standing balance-Leahy Scale: Fair                               Pertinent Vitals/Pain Pain Assessment: Faces Faces Pain Scale: Hurts little more Pain Location: headache Pain Descriptors / Indicators: Headache Pain Intervention(s): Limited activity within patient's tolerance;Monitored during session;Repositioned    Home Living Family/patient expects to be discharged to:: Private residence Living Arrangements: Alone Available Help at Discharge: Neighbor Type of Home: House Home Access: Stairs to enter Entrance Stairs-Rails: Psychiatric nurse of Steps: 2 Home Layout: Two level Home Equipment: Shower seat Additional Comments: Reports she has good neighbors that check on her often.    Prior Function Level of Independence: Independent               Hand Dominance        Extremity/Trunk Assessment   Upper Extremity Assessment Upper Extremity Assessment: Overall WFL for tasks assessed    Lower Extremity Assessment Lower Extremity Assessment: Generalized weakness    Cervical / Trunk Assessment Cervical / Trunk Assessment: Normal  Communication   Communication: No difficulties  Cognition Arousal/Alertness: Awake/alert Behavior During Therapy: WFL for tasks assessed/performed Overall Cognitive Status: Within Functional Limits for tasks assessed  General Comments General comments (skin integrity, edema, etc.): Discussed HHPT, but pt reports she feels close to her baseline and does not feel like she will need at d/c    Exercises     Assessment/Plan    PT Assessment Patient needs continued PT services  PT Problem  List Decreased activity tolerance;Decreased strength;Decreased mobility;Decreased balance;Decreased knowledge of precautions       PT Treatment Interventions DME instruction;Gait training;Functional mobility training;Therapeutic exercise;Therapeutic activities;Balance training;Patient/family education;Stair training    PT Goals (Current goals can be found in the Care Plan section)  Acute Rehab PT Goals Patient Stated Goal: to go home PT Goal Formulation: With patient Time For Goal Achievement: 01/06/21 Potential to Achieve Goals: Good    Frequency Min 3X/week   Barriers to discharge Decreased caregiver support      Co-evaluation               AM-PAC PT "6 Clicks" Mobility  Outcome Measure Help needed turning from your back to your side while in a flat bed without using bedrails?: None Help needed moving from lying on your back to sitting on the side of a flat bed without using bedrails?: A Little Help needed moving to and from a bed to a chair (including a wheelchair)?: A Little Help needed standing up from a chair using your arms (e.g., wheelchair or bedside chair)?: A Little Help needed to walk in hospital room?: A Little Help needed climbing 3-5 steps with a railing? : A Little 6 Click Score: 19    End of Session Equipment Utilized During Treatment: Gait belt Activity Tolerance: Patient tolerated treatment well Patient left: in bed;with call bell/phone within reach (on stretcher in ED) Nurse Communication: Mobility status PT Visit Diagnosis: Muscle weakness (generalized) (M62.81)    Time: 1050-1110 PT Time Calculation (min) (ACUTE ONLY): 20 min   Charges:   PT Evaluation $PT Eval Low Complexity: 1 Low          Lou Miner, DPT  Acute Rehabilitation Services  Pager: [phone] Office: [phone]   Rudean Hitt 12/23/2020, 11:50 AM

## 2020-12-23 NOTE — Progress Notes (Signed)
[facility]  PROGRESS NOTE  Caitlin Irwin:[id number] DOB: 1951-07-05 DOA: 12/22/2020 PCP: Levin Erp, MD   Brief HPI:   70 year old female with a history of trigeminal neuralgia, anxiety, chronic pain, hypothyroidism presented to ED with near syncope, right facial droop, slurred speech and numbness involving left tongue.  Patient said that she was chasing her dog up and down the stairs and then became lightheaded and felt as though she may pass out but did not actually lose consciousness.  Patient's neighbor noted that she had right facial droop and slurred speech and recommended evaluation in the ED.  In the ED she was found to have 49 mm troponin systolic blood pressure upon standing.  EKG showed sinus rhythm with LAFB.  CT head was negative for acute finding.  Potassium was 2.9.  Neurology saw the patient and recommended MRI brain which was normal.    Subjective   This morning she complains of migraine headaches.   Assessment/Plan:     1. Near syncope/orthostasis-patient reported he had right facial droop, slurred speech and numbness of left side of tongue.  She continued to have lightheadedness and blood pressure dropped 49 mmHg in SBP on standing.  We will start TED hose bilaterally.  Check orthostatic vital signs every 4 hours. 2. TIA/dysarthria-neurology saw the patient, no further work-up for stroke ordered as MRI brain was unremarkable. 3. Trigeminal neuralgia/migraine-continue Topamax, oxcarbazepine. 4. Hypokalemia-replete 5. Hypothyroidism-continue Synthroid   Scheduled medications:   . enoxaparin (LOVENOX) injection  40 mg Subcutaneous Daily  . FLUoxetine  40 mg Oral Daily  . gabapentin  100 mg Oral TID  . levothyroxine  75 mcg Oral QAC breakfast  . OXcarbazepine  150 mg Oral Daily  . pantoprazole  40 mg Oral Daily  . potassium chloride SA  20 mEq Oral Daily  . topiramate  50 mg Oral BID         Data Reviewed:   CBG:  Recent Labs  Lab  12/22/20 1938  GLUCAP 112*    SpO2: 97 %    Vitals:   12/23/20 1500 12/23/20 1600 12/23/20 1700 12/23/20 1819  BP: (!) 109/59 (!) 112/54 (!) 116/56 (!) 100/50  Pulse: 64 (!) 57 (!) 52 (!) 53  Resp: 17 14 12 16   Temp:    98.2 F (36.8 C)  TempSrc:    Oral  SpO2: 95% 95% 96% 97%    No intake or output data in the 24 hours ending 12/23/20 1839  No intake/output data recorded.  There were no vitals filed for this visit.  CBC:  Recent Labs  Lab 12/22/20 1936 12/22/20 1957 12/23/20 0352  WBC 9.1  --  7.1  HGB 15.2* 14.6 13.3  HCT 45.6 43.0 40.3  PLT 308  --  248  MCV 89.9  --  90.0  MCH 30.0  --  29.7  MCHC 33.3  --  33.0  RDW 12.8  --  12.6  LYMPHSABS 2.1  --   --   MONOABS 0.8  --   --   EOSABS 0.1  --   --   BASOSABS 0.1  --   --     Complete metabolic panel:  Recent Labs  Lab 12/22/20 1936 12/22/20 1957 12/23/20 0352  NA 134* 137 136  K 2.9* 2.9* 3.8  CL 105 104 109  CO2 20*  --  21*  GLUCOSE 101* 100* 97  BUN 10 10 7*  CREATININE 0.88 0.70 0.73  CALCIUM 9.8  --  9.2  AST 19  --   --   ALT 16  --   --   ALKPHOS 82  --   --   BILITOT 1.0  --   --   ALBUMIN 4.0  --   --   MG  --   --  2.4  INR 1.0  --   --     No results for input(s): LIPASE, AMYLASE in the last 168 hours.  Recent Labs  Lab 12/22/20 1937  SARSCOV2NAA NEGATIVE    ------------------------------------------------------------------------------------------------------------------ No results for input(s): CHOL, HDL, LDLCALC, TRIG, CHOLHDL, LDLDIRECT in the last 72 hours.  Lab Results  Component Value Date   HGBA1C (H) 11/25/2009    5.7 (NOTE)                                                                       According to the ADA Clinical Practice Recommendations for 2011, when HbA1c is used as a screening test:   >=6.5%   Diagnostic of Diabetes Mellitus           (if abnormal result  is confirmed)  5.7-6.4%   Increased risk of developing Diabetes Mellitus   References:Diagnosis and Classification of Diabetes Mellitus,Diabetes Care,2011,34(Suppl 1):S62-S69 and Standards of Medical Care in         Diabetes - 2011,Diabetes PXTG,6269,48  (Suppl 1):S11-S61.   ------------------------------------------------------------------------------------------------------------------ No results for input(s): TSH, T4TOTAL, T3FREE, THYROIDAB in the last 72 hours.  Invalid input(s): FREET3 ------------------------------------------------------------------------------------------------------------------ No results for input(s): VITAMINB12, FOLATE, FERRITIN, TIBC, IRON, RETICCTPCT in the last 72 hours.  Coagulation profile Recent Labs  Lab 12/22/20 1936  INR 1.0   No results for input(s): DDIMER in the last 72 hours.  Cardiac Enzymes No results for input(s): CKTOTAL, CKMB, CKMBINDEX, TROPONINI in the last 168 hours.  ------------------------------------------------------------------------------------------------------------------ No results found for: BNP   Antibiotics: Anti-infectives (From admission, onward)   None       Radiology Reports  MR BRAIN WO CONTRAST  Result Date: 12/22/2020 CLINICAL DATA:  Initial evaluation for acute TIA. EXAM: MRI HEAD WITHOUT CONTRAST TECHNIQUE: Multiplanar, multiecho pulse sequences of the brain and surrounding structures were obtained without intravenous contrast. COMPARISON:  Prior head CT from earlier the same day as well as previous brain MRI from 10/12/2016. FINDINGS: Brain: Cerebral volume within normal limits for age. No significant cerebral white matter disease. No abnormal foci of restricted diffusion to suggest acute or subacute ischemia. Gray-white matter differentiation maintained. No encephalomalacia to suggest chronic cortical infarction. No evidence for acute or chronic intracranial hemorrhage. No mass lesion, midline shift or mass effect. No hydrocephalus or extra-axial fluid collection. Postoperative  changes from prior left suboccipital craniotomy for trigeminal nerve decompression again seen, stable. Vascular: Major intracranial vascular flow voids are maintained. Skull and upper cervical spine: Craniocervical junction within normal limits. Bone marrow signal intensity normal. No focal marrow replacing lesion. No scalp soft tissue abnormality. Prior left suboccipital craniotomy. Sinuses/Orbits: Patient status post ocular lens replacement on the left. Globes and orbital soft tissues demonstrate no acute finding. Mild scattered mucosal thickening noted within the ethmoidal air cells. Paranasal sinuses are otherwise clear. No significant mastoid effusion. Inner ear structures grossly normal. Other: None. IMPRESSION: 1. No acute intracranial abnormality. 2. Stable postoperative  changes from prior left suboccipital craniotomy for trigeminal nerve treatment. Otherwise unremarkable brain MRI for age. Electronically Signed   By: Jeannine Boga M.D.   On: 12/22/2020 21:51   CT HEAD CODE STROKE WO CONTRAST  Result Date: 12/22/2020 CLINICAL DATA:  Code stroke. Facial numbness. Right arm numbness. Speech disturbance. EXAM: CT HEAD WITHOUT CONTRAST TECHNIQUE: Contiguous axial images were obtained from the base of the skull through the vertex without intravenous contrast. COMPARISON:  03/30/2017.  10/12/2016. FINDINGS: Brain: Mild age related volume loss. Mild chronic small-vessel change of the hemispheric white matter. No sign of acute infarction. Previous left occipital craniectomy with high-density material along the inter margin of the cranioplasty which is not changed over time. Brainstem and cerebellum appear otherwise unremarkable. Vascular: No abnormal vascular finding. Skull: Left occipital craniectomy and cranioplasty as described above. Sinuses/Orbits: Normal Other: None ASPECTS ([place] Stroke Program Early CT Score) - Ganglionic level infarction (caudate, lentiform nuclei, internal capsule, insula,  M1-M3 cortex): 7 - Supraganglionic infarction (M4-M6 cortex): 3 Total score (0-10 with 10 being normal): 10 IMPRESSION: 1. No acute CT finding. Previous left occipital craniectomy and cranioplasty. Chronic high-density material along the inter margin of the cranioplasty has not changed over time. 2. ASPECTS is 10 3. These results were communicated to Dr. Lorrin Goodell At 8:02 pmon 5/24/2022by text page via the [facility] messaging system. Electronically Signed   By: Nelson Chimes M.D.   On: 12/22/2020 20:04      DVT prophylaxis: Lovenox  Code Status: Full code  Family Communication: No family at bedside   Consultants:  Neurology  Procedures:      Objective    Physical Examination:    General-appears in no acute distress  Heart-S1-S2, regular, no murmur auscultated  Lungs-clear to auscultation bilaterally, no wheezing or crackles auscultated  Abdomen-soft, nontender, no organomegaly  Extremities-no edema in the lower extremities  Neuro-alert, oriented x3, no focal deficit noted   Status is: Inpatient  Dispo: The patient is from: Home              Anticipated d/c is to: Home              Anticipated d/c date is: 12/24/2020              Patient currently not stable for discharge  Barrier to discharge-ongoing evaluation for TIA  COVID-19 Labs  No results for input(s): DDIMER, FERRITIN, LDH, CRP in the last 72 hours.  Lab Results  Component Value Date   [place] NEGATIVE 12/22/2020    Microbiology  Recent Results (from the past 240 hour(s))  Resp Panel by RT-PCR (Flu A&B, Covid) Nasopharyngeal Swab     Status: None   Collection Time: 12/22/20  7:37 PM   Specimen: Nasopharyngeal Swab; Nasopharyngeal(NP) swabs in vial transport medium  Result Value Ref Range Status   SARS Coronavirus 2 by RT PCR NEGATIVE NEGATIVE Final    Comment: (NOTE) SARS-CoV-2 target nucleic acids are NOT DETECTED.  The SARS-CoV-2 RNA is generally detectable in upper  respiratory specimens during the acute phase of infection. The lowest concentration of SARS-CoV-2 viral copies this assay can detect is 138 copies/mL. A negative result does not preclude SARS-Cov-2 infection and should not be used as the sole basis for treatment or other patient management decisions. A negative result may occur with  improper specimen collection/handling, submission of specimen other than nasopharyngeal swab, presence of viral mutation(s) within the areas targeted by this assay, and inadequate number of viral copies(<138 copies/mL). A negative  result must be combined with clinical observations, patient history, and epidemiological information. The expected result is Negative.  Fact Sheet for Patients:  EntrepreneurPulse.com.au  Fact Sheet for Healthcare Providers:  IncredibleEmployment.be  This test is no t yet approved or cleared by the [place] FDA and  has been authorized for detection and/or diagnosis of SARS-CoV-2 by FDA under an Emergency Use Authorization (EUA). This EUA will remain  in effect (meaning this test can be used) for the duration of the COVID-19 declaration under Section 564(b)(1) of the Act, 21 U.S.C.section 360bbb-3(b)(1), unless the authorization is terminated  or revoked sooner.       Influenza A by PCR NEGATIVE NEGATIVE Final   Influenza B by PCR NEGATIVE NEGATIVE Final    Comment: (NOTE) The Xpert Xpress SARS-CoV-2/FLU/RSV plus assay is intended as an aid in the diagnosis of influenza from Nasopharyngeal swab specimens and should not be used as a sole basis for treatment. Nasal washings and aspirates are unacceptable for Xpert Xpress SARS-CoV-2/FLU/RSV testing.  Fact Sheet for Patients: EntrepreneurPulse.com.au  Fact Sheet for Healthcare Providers: IncredibleEmployment.be  This test is not yet approved or cleared by the [place] FDA and has been  authorized for detection and/or diagnosis of SARS-CoV-2 by FDA under an Emergency Use Authorization (EUA). This EUA will remain in effect (meaning this test can be used) for the duration of the COVID-19 declaration under Section 564(b)(1) of the Act, 21 U.S.C. section 360bbb-3(b)(1), unless the authorization is terminated or revoked.  Performed at [facility], [place] [address] [place], [place] [address]              [facility] If 7PM-7AM, please contact night-coverage at [email or web address], Office  [phone]   12/23/2020, 6:39 PM  LOS: 0 days

## 2020-12-24 ENCOUNTER — Other Ambulatory Visit: Payer: Self-pay

## 2020-12-24 DIAGNOSIS — Z923 Personal history of irradiation: Secondary | ICD-10-CM | POA: Diagnosis not present

## 2020-12-24 DIAGNOSIS — E860 Dehydration: Secondary | ICD-10-CM | POA: Diagnosis present

## 2020-12-24 DIAGNOSIS — G43909 Migraine, unspecified, not intractable, without status migrainosus: Secondary | ICD-10-CM | POA: Diagnosis present

## 2020-12-24 DIAGNOSIS — E039 Hypothyroidism, unspecified: Secondary | ICD-10-CM | POA: Diagnosis present

## 2020-12-24 DIAGNOSIS — R29818 Other symptoms and signs involving the nervous system: Secondary | ICD-10-CM | POA: Diagnosis not present

## 2020-12-24 DIAGNOSIS — K589 Irritable bowel syndrome without diarrhea: Secondary | ICD-10-CM | POA: Diagnosis present

## 2020-12-24 DIAGNOSIS — R630 Anorexia: Secondary | ICD-10-CM | POA: Diagnosis present

## 2020-12-24 DIAGNOSIS — M810 Age-related osteoporosis without current pathological fracture: Secondary | ICD-10-CM | POA: Diagnosis present

## 2020-12-24 DIAGNOSIS — N189 Chronic kidney disease, unspecified: Secondary | ICD-10-CM | POA: Diagnosis present

## 2020-12-24 DIAGNOSIS — K219 Gastro-esophageal reflux disease without esophagitis: Secondary | ICD-10-CM | POA: Diagnosis present

## 2020-12-24 DIAGNOSIS — Z90710 Acquired absence of both cervix and uterus: Secondary | ICD-10-CM | POA: Diagnosis not present

## 2020-12-24 DIAGNOSIS — F419 Anxiety disorder, unspecified: Secondary | ICD-10-CM | POA: Diagnosis present

## 2020-12-24 DIAGNOSIS — N179 Acute kidney failure, unspecified: Secondary | ICD-10-CM | POA: Diagnosis present

## 2020-12-24 DIAGNOSIS — Z8711 Personal history of peptic ulcer disease: Secondary | ICD-10-CM | POA: Diagnosis not present

## 2020-12-24 DIAGNOSIS — E7800 Pure hypercholesterolemia, unspecified: Secondary | ICD-10-CM | POA: Diagnosis present

## 2020-12-24 DIAGNOSIS — R55 Syncope and collapse: Secondary | ICD-10-CM | POA: Diagnosis present

## 2020-12-24 DIAGNOSIS — G500 Trigeminal neuralgia: Secondary | ICD-10-CM | POA: Diagnosis present

## 2020-12-24 DIAGNOSIS — Z20822 Contact with and (suspected) exposure to covid-19: Secondary | ICD-10-CM | POA: Diagnosis present

## 2020-12-24 DIAGNOSIS — G8929 Other chronic pain: Secondary | ICD-10-CM | POA: Diagnosis present

## 2020-12-24 DIAGNOSIS — R471 Dysarthria and anarthria: Secondary | ICD-10-CM | POA: Diagnosis present

## 2020-12-24 DIAGNOSIS — F32A Depression, unspecified: Secondary | ICD-10-CM | POA: Diagnosis present

## 2020-12-24 DIAGNOSIS — Z981 Arthrodesis status: Secondary | ICD-10-CM | POA: Diagnosis not present

## 2020-12-24 DIAGNOSIS — R29810 Facial weakness: Secondary | ICD-10-CM | POA: Diagnosis present

## 2020-12-24 DIAGNOSIS — Z886 Allergy status to analgesic agent status: Secondary | ICD-10-CM | POA: Diagnosis not present

## 2020-12-24 DIAGNOSIS — E876 Hypokalemia: Secondary | ICD-10-CM | POA: Diagnosis present

## 2020-12-24 LAB — CBC
HCT: 40 % (ref 36.0–46.0)
Hemoglobin: 12.9 g/dL (ref 12.0–15.0)
MCH: 29.2 pg (ref 26.0–34.0)
MCHC: 32.3 g/dL (ref 30.0–36.0)
MCV: 90.5 fL (ref 80.0–100.0)
Platelets: 259 10*3/uL (ref 150–400)
RBC: 4.42 MIL/uL (ref 3.87–5.11)
RDW: 13.2 % (ref 11.5–15.5)
WBC: 5.5 10*3/uL (ref 4.0–10.5)
nRBC: 0 % (ref 0.0–0.2)

## 2020-12-24 LAB — BASIC METABOLIC PANEL
Anion gap: 4 — ABNORMAL LOW (ref 5–15)
BUN: 10 mg/dL (ref 8–23)
CO2: 21 mmol/L — ABNORMAL LOW (ref 22–32)
Calcium: 9.4 mg/dL (ref 8.9–10.3)
Chloride: 110 mmol/L (ref 98–111)
Creatinine, Ser: 1.1 mg/dL — ABNORMAL HIGH (ref 0.44–1.00)
GFR, Estimated: 54 mL/min — ABNORMAL LOW (ref >60)
Glucose, Bld: 96 mg/dL (ref 70–99)
Potassium: 5 mmol/L (ref 3.5–5.1)
Sodium: 135 mmol/L (ref 135–145)

## 2020-12-24 MED ORDER — SODIUM ZIRCONIUM CYCLOSILICATE 5 G PO PACK: 5.0000 g | PACK | Freq: Once | ORAL | Status: DC

## 2020-12-24 MED ORDER — OXYCODONE HCL 5 MG PO TABS
5.0000 mg | ORAL_TABLET | Freq: Once | ORAL | Status: DC
  Filled 2020-12-24: qty 1

## 2020-12-24 MED ORDER — ZOLPIDEM TARTRATE 5 MG PO TABS
5.0000 mg | ORAL_TABLET | Freq: Once | ORAL | Status: AC
  Administered 2020-12-24: 5 mg via ORAL
  Filled 2020-12-24: qty 1

## 2020-12-24 MED ORDER — SODIUM CHLORIDE 0.9 % IV SOLN: INTRAVENOUS | Status: DC

## 2020-12-24 MED ORDER — SUMATRIPTAN SUCCINATE 50 MG PO TABS
50.0000 mg | ORAL_TABLET | ORAL | Status: DC | PRN
  Administered 2020-12-24 (×2): 50 mg via ORAL
  Filled 2020-12-24 (×3): qty 1

## 2020-12-24 MED ORDER — MORPHINE SULFATE (PF) 2 MG/ML IV SOLN
1.0000 mg | Freq: Once | INTRAVENOUS | Status: AC
Start: 2020-12-24 — End: 2020-12-24
  Administered 2020-12-24: 1 mg via INTRAVENOUS
  Filled 2020-12-24: qty 1

## 2020-12-24 NOTE — Progress Notes (Signed)
Physical Therapy Treatment Patient Details Name: Caitlin Irwin MRN: [id number] DOB: Dec 12, 1950 Today's Date: 12/24/2020    History of Present Illness Pt is a 70 y/o female admitted 5/24 secondary to syncopal episode and R facial droop with slurred speech. MRI negative. PMH includes trigeminal neuralgia and GERD.    PT Comments    Patient with mild dizziness this session. Patient requires min guard for safety with mobility. Patient negotiated 2 stairs with B handrails and min guard. Educated and encouraged patient on importance of walking program with assistance/supervision to promote endurance, balance, and strength. Patient continues to decline follow up PT services after discharge.     Follow Up Recommendations  No PT follow up     Equipment Recommendations  None recommended by PT    Recommendations for Other Services       Precautions / Restrictions Precautions Precautions: Fall Restrictions Weight Bearing Restrictions: No    Mobility  Bed Mobility Overal bed mobility: Needs Assistance Bed Mobility: Supine to Sit;Sit to Supine     Supine to sit: Supervision Sit to supine: Supervision   General bed mobility comments: Supervision for safety.    Transfers Overall transfer level: Needs assistance Equipment used: None Transfers: Sit to/from Stand Sit to Stand: Supervision         General transfer comment: supervision for safety  Ambulation/Gait Ambulation/Gait assistance: Min guard Gait Distance (Feet): 250 Feet Assistive device: 1 person hand held assist Gait Pattern/deviations: Step-through pattern;Decreased stride length Gait velocity: Decreased   General Gait Details: Min guard for safety. Mild dizziness noted but orthostatics per NT were [facility]. No overt LOB noted   Stairs Stairs: Yes Stairs assistance: Min guard Stair Management: Two rails;Step to pattern;Forwards Number of Stairs: 2     Wheelchair Mobility    Modified Rankin (Stroke  Patients Only)       Balance Overall balance assessment: Mild deficits observed, not formally tested                                          Cognition Arousal/Alertness: Awake/alert Behavior During Therapy: WFL for tasks assessed/performed Overall Cognitive Status: Within Functional Limits for tasks assessed                                        Exercises      General Comments        Pertinent Vitals/Pain Pain Assessment: Faces Faces Pain Scale: Hurts little more Pain Location: headache Pain Descriptors / Indicators: Headache Pain Intervention(s): Monitored during session;Repositioned    Home Living                      Prior Function            PT Goals (current goals can now be found in the care plan section) Acute Rehab PT Goals Patient Stated Goal: to go home PT Goal Formulation: With patient Time For Goal Achievement: 01/06/21 Potential to Achieve Goals: Good Progress towards PT goals: Progressing toward goals    Frequency    Min 3X/week      PT Plan Current plan remains appropriate    Co-evaluation              AM-PAC PT "6 Clicks" Mobility   Outcome Measure  Help needed  turning from your back to your side while in a flat bed without using bedrails?: None Help needed moving from lying on your back to sitting on the side of a flat bed without using bedrails?: A Little Help needed moving to and from a bed to a chair (including a wheelchair)?: A Little Help needed standing up from a chair using your arms (e.g., wheelchair or bedside chair)?: A Little Help needed to walk in hospital room?: A Little Help needed climbing 3-5 steps with a railing? : A Little 6 Click Score: 19    End of Session Equipment Utilized During Treatment: Gait belt Activity Tolerance: Patient tolerated treatment well Patient left: in bed;with call bell/phone within reach;with bed alarm set Nurse Communication: Mobility  status PT Visit Diagnosis: Muscle weakness (generalized) (M62.81)     Time: 7670-1100 PT Time Calculation (min) (ACUTE ONLY): 24 min  Charges:  $Therapeutic Activity: 23-37 mins                     Talor Cheema A. Gilford Rile PT, DPT Acute Rehabilitation Services Pager [phone] Office [phone]    Linna Hoff 12/24/2020, 12:48 PM

## 2020-12-24 NOTE — TOC Initial Note (Signed)
Transition of Care [facility]) - Initial/Assessment Note    Patient Details  Name: Caitlin Irwin MRN: [id number] Date of Birth: Aug 26, 1950  Transition of Care [facility]) CM/SW Contact:    Pollie Friar, RN Phone Number: 12/24/2020, 3:59 PM  Clinical Narrative:                 Pt lives at home alone. She states she has a very supportive neighbor that looks after her and will check in on her.  She uses friends or cabs for transportation. She denies any issues with home medications. No f/u per PT and no DME needs.  TOC following.  Expected Discharge Plan: Home/Self Care Barriers to Discharge: Continued Medical Work up   Patient Goals and CMS Choice        Expected Discharge Plan and Services Expected Discharge Plan: Home/Self Care   Discharge Planning Services: CM Consult   Living arrangements for the past 2 months: Single Family Home                                      Prior Living Arrangements/Services Living arrangements for the past 2 months: Single Family Home Lives with:: Self Patient language and need for interpreter reviewed:: Yes Do you feel safe going back to the place where you live?: Yes        Care giver support system in place?: No (comment) Current home services: DME (shower seat) Criminal Activity/Legal Involvement Pertinent to Current Situation/Hospitalization: No - Comment as needed  Activities of Daily Living      Permission Sought/Granted                  Emotional Assessment Appearance:: Appears stated age Attitude/Demeanor/Rapport: Engaged Affect (typically observed): Accepting Orientation: : Oriented to Self,Oriented to Place,Oriented to  Time,Oriented to Situation   Psych Involvement: No (comment)  Admission diagnosis:  Hypokalemia [E87.6] Slurred speech [R47.81] Transient neurologic deficit [R29.818] Patient Active Problem List   Diagnosis Date Noted  . Slurred speech   . Transient neurologic deficit 12/22/2020  .  Anxiety   . Orthostasis   . Closed fracture of proximal end of right tibia and fibula 11/13/2018  . Bilateral temporomandibular joint pain 10/21/2015  . Ulceration, tongue traumatic 10/21/2015  . Epigastric hernia 09/15/2015  . Migraine, intractable 05/28/2015  . Migraine 05/28/2015  . Headache, migraine   . Insomnia   . Emesis   . History of hypothyroidism   . Microcytic hypochromic anemia   . Salicylate poisoning 73/42/8768  . Migraine headache 08/28/2014  . Hypokalemia 08/28/2014  . Salicylate intoxication 08/28/2014  . Aspirin toxicity   . Ataxia 03/24/2014  . Complicated grief 11/57/2620  . Altered mental status, unspecified altered mental status type 10/13/2013  . Polypharmacy 10/13/2013  . Depression 10/13/2013  . Atypical face pain 02/10/2013  . Esophageal reflux 06/12/2012  . Trigeminal neuralgia 11/07/2011  . Trigeminal neuralgia of left side of face 11/07/2011  . Hx of migraine headaches 10/20/2011  . Dysphagia, unspecified(787.20) 09/26/2011  . Weight gain 09/26/2011  . NAUSEA 02/02/2010  . DIARRHEA 02/02/2010  . CHANGE IN BOWELS 02/02/2010  . ULCER-DUODENAL 11/26/2008  . History of peptic ulcer 11/26/2008  . GASTRITIS 11/26/2008  . Hypothyroidism 10/03/2007  . CONSTIPATION, CHRONIC 10/03/2007  . IRRITABLE BOWEL SYNDROME 10/03/2007  . COLITIS, HX OF 10/03/2007   PCP:  Levin Erp, MD Pharmacy:   [id number] [place], [place] -  [address] [place] [address] Phone: [phone] Fax: [phone]  [id number] - [address] [place] [address] Phone: [phone] Fax: [phone]     Social Determinants of Health (SDOH) Interventions    Readmission Risk Interventions No flowsheet data found.

## 2020-12-24 NOTE — Progress Notes (Signed)
[facility]  PROGRESS NOTE  Caitlin Irwin:[id number] DOB: October 29, 1950 DOA: 12/22/2020 PCP: Levin Erp, MD   Brief HPI:   70 year old female with a history of trigeminal neuralgia, anxiety, chronic pain, hypothyroidism presented to ED with near syncope, right facial droop, slurred speech and numbness involving left tongue.  Patient said that she was chasing her dog up and down the stairs and then became lightheaded and felt as though she may pass out but did not actually lose consciousness.  Patient's neighbor noted that she had right facial droop and slurred speech and recommended evaluation in the ED.  In the ED she was found to have 49 mm troponin systolic blood pressure upon standing.  EKG showed sinus rhythm with LAFB.  CT head was negative for acute finding.  Potassium was 2.9.  Neurology saw the patient and recommended MRI brain which was normal.    Subjective   Patient seen and examined, complains of dizziness.  She has mild elevation of creatinine and potassium today.  Had soft blood pressure yesterday.   Assessment/Plan:     1. Near syncope/orthostasis-patient reported he had right facial droop, slurred speech and numbness of left side of tongue.  She continued to have lightheadedness and blood pressure dropped 49 mmHg in SBP on standing.  Started on TED hose bilaterally.  Blood pressure has improved. 2. Mild AKI-patient has mild elevation of creatinine 1.10 along with potassium.  She was getting potassium supplementation for hypokalemia.  Today potassium is 5.0 and she received 20 mEq of K. Dur this morning.  We will start IV normal saline at 75 mm/h.  Follow BMP in am. 3. TIA/dysarthria-neurology saw the patient, no further work-up for stroke ordered as MRI brain was unremarkable. 4. Trigeminal neuralgia/migraine-continue Topamax, oxcarbazepine. 5. Hypokalemia-replete 6. Hypothyroidism-continue Synthroid   Scheduled medications:   . enoxaparin (LOVENOX) injection   40 mg Subcutaneous Daily  . FLUoxetine  40 mg Oral Daily  . gabapentin  100 mg Oral TID  . levothyroxine  75 mcg Oral QAC breakfast  . OXcarbazepine  150 mg Oral Daily  . oxyCODONE  5 mg Oral Once  . pantoprazole  40 mg Oral Daily  . topiramate  50 mg Oral BID         Data Reviewed:   CBG:  Recent Labs  Lab 12/22/20 1938  GLUCAP 112*    SpO2: 99 %    Vitals:   12/24/20 0740 12/24/20 1128 12/24/20 1131 12/24/20 1531  BP: (!) 116/52 128/63 133/63 111/61  Pulse: (!) 55 (!) 58 66 (!) 58  Resp: 16 18 18 16   Temp: 97.7 F (36.5 C) 97.7 F (36.5 C)  97.7 F (36.5 C)  TempSrc: Oral Oral  Oral  SpO2: 99% 97% 99% 99%     Intake/Output Summary (Last 24 hours) at 12/24/2020 1545 Last data filed at 12/23/2020 2100 Gross per 24 hour  Intake 120 ml  Output --  Net 120 ml    05/24 1901 - 05/26 0700 In: 120 [P.O.:120] Out: -   There were no vitals filed for this visit.  CBC:  Recent Labs  Lab 12/22/20 1936 12/22/20 1957 12/23/20 0352 12/24/20 0059  WBC 9.1  --  7.1 5.5  HGB 15.2* 14.6 13.3 12.9  HCT 45.6 43.0 40.3 40.0  PLT 308  --  248 259  MCV 89.9  --  90.0 90.5  MCH 30.0  --  29.7 29.2  MCHC 33.3  --  33.0 32.3  RDW 12.8  --  12.6 13.2  LYMPHSABS 2.1  --   --   --   MONOABS 0.8  --   --   --   EOSABS 0.1  --   --   --   BASOSABS 0.1  --   --   --     Complete metabolic panel:  Recent Labs  Lab 12/22/20 1936 12/22/20 1957 12/23/20 0352 12/24/20 0059  NA 134* 137 136 135  K 2.9* 2.9* 3.8 5.0  CL 105 104 109 110  CO2 20*  --  21* 21*  GLUCOSE 101* 100* 97 96  BUN 10 10 7* 10  CREATININE 0.88 0.70 0.73 1.10*  CALCIUM 9.8  --  9.2 9.4  AST 19  --   --   --   ALT 16  --   --   --   ALKPHOS 82  --   --   --   BILITOT 1.0  --   --   --   ALBUMIN 4.0  --   --   --   MG  --   --  2.4  --   INR 1.0  --   --   --     No results for input(s): LIPASE, AMYLASE in the last 168 hours.  Recent Labs  Lab 12/22/20 1937  SARSCOV2NAA NEGATIVE     ------------------------------------------------------------------------------------------------------------------ No results for input(s): CHOL, HDL, LDLCALC, TRIG, CHOLHDL, LDLDIRECT in the last 72 hours.  Lab Results  Component Value Date   HGBA1C (H) 11/25/2009    5.7 (NOTE)                                                                       According to the ADA Clinical Practice Recommendations for 2011, when HbA1c is used as a screening test:   >=6.5%   Diagnostic of Diabetes Mellitus           (if abnormal result  is confirmed)  5.7-6.4%   Increased risk of developing Diabetes Mellitus  References:Diagnosis and Classification of Diabetes Mellitus,Diabetes Care,2011,34(Suppl 1):S62-S69 and Standards of Medical Care in         Diabetes - 2011,Diabetes HQIO,9629,52  (Suppl 1):S11-S61.   ------------------------------------------------------------------------------------------------------------------ No results for input(s): TSH, T4TOTAL, T3FREE, THYROIDAB in the last 72 hours.  Invalid input(s): FREET3 ------------------------------------------------------------------------------------------------------------------ No results for input(s): VITAMINB12, FOLATE, FERRITIN, TIBC, IRON, RETICCTPCT in the last 72 hours.  Coagulation profile Recent Labs  Lab 12/22/20 1936  INR 1.0   No results for input(s): DDIMER in the last 72 hours.  Cardiac Enzymes No results for input(s): CKTOTAL, CKMB, CKMBINDEX, TROPONINI in the last 168 hours.  ------------------------------------------------------------------------------------------------------------------ No results found for: BNP   Antibiotics: Anti-infectives (From admission, onward)   None       Radiology Reports  MR BRAIN WO CONTRAST  Result Date: 12/22/2020 CLINICAL DATA:  Initial evaluation for acute TIA. EXAM: MRI HEAD WITHOUT CONTRAST TECHNIQUE: Multiplanar, multiecho pulse sequences of the brain and surrounding  structures were obtained without intravenous contrast. COMPARISON:  Prior head CT from earlier the same day as well as previous brain MRI from 10/12/2016. FINDINGS: Brain: Cerebral volume within normal limits for age. No significant cerebral white matter disease. No abnormal foci of restricted diffusion to suggest acute  or subacute ischemia. Gray-white matter differentiation maintained. No encephalomalacia to suggest chronic cortical infarction. No evidence for acute or chronic intracranial hemorrhage. No mass lesion, midline shift or mass effect. No hydrocephalus or extra-axial fluid collection. Postoperative changes from prior left suboccipital craniotomy for trigeminal nerve decompression again seen, stable. Vascular: Major intracranial vascular flow voids are maintained. Skull and upper cervical spine: Craniocervical junction within normal limits. Bone marrow signal intensity normal. No focal marrow replacing lesion. No scalp soft tissue abnormality. Prior left suboccipital craniotomy. Sinuses/Orbits: Patient status post ocular lens replacement on the left. Globes and orbital soft tissues demonstrate no acute finding. Mild scattered mucosal thickening noted within the ethmoidal air cells. Paranasal sinuses are otherwise clear. No significant mastoid effusion. Inner ear structures grossly normal. Other: None. IMPRESSION: 1. No acute intracranial abnormality. 2. Stable postoperative changes from prior left suboccipital craniotomy for trigeminal nerve treatment. Otherwise unremarkable brain MRI for age. Electronically Signed   By: Jeannine Boga M.D.   On: 12/22/2020 21:51   CT HEAD CODE STROKE WO CONTRAST  Result Date: 12/22/2020 CLINICAL DATA:  Code stroke. Facial numbness. Right arm numbness. Speech disturbance. EXAM: CT HEAD WITHOUT CONTRAST TECHNIQUE: Contiguous axial images were obtained from the base of the skull through the vertex without intravenous contrast. COMPARISON:  03/30/2017.  10/12/2016.  FINDINGS: Brain: Mild age related volume loss. Mild chronic small-vessel change of the hemispheric white matter. No sign of acute infarction. Previous left occipital craniectomy with high-density material along the inter margin of the cranioplasty which is not changed over time. Brainstem and cerebellum appear otherwise unremarkable. Vascular: No abnormal vascular finding. Skull: Left occipital craniectomy and cranioplasty as described above. Sinuses/Orbits: Normal Other: None ASPECTS ([place] Stroke Program Early CT Score) - Ganglionic level infarction (caudate, lentiform nuclei, internal capsule, insula, M1-M3 cortex): 7 - Supraganglionic infarction (M4-M6 cortex): 3 Total score (0-10 with 10 being normal): 10 IMPRESSION: 1. No acute CT finding. Previous left occipital craniectomy and cranioplasty. Chronic high-density material along the inter margin of the cranioplasty has not changed over time. 2. ASPECTS is 10 3. These results were communicated to Dr. Lorrin Goodell At 8:02 pmon 5/24/2022by text page via the [facility] messaging system. Electronically Signed   By: Nelson Chimes M.D.   On: 12/22/2020 20:04      DVT prophylaxis: Lovenox  Code Status: Full code  Family Communication: No family at bedside   Consultants:  Neurology  Procedures:      Objective    Physical Examination:    General-appears in no acute distress  Heart-S1-S2, regular, no murmur auscultated  Lungs-clear to auscultation bilaterally, no wheezing or crackles auscultated  Abdomen-soft, nontender, no organomegaly  Extremities-no edema in the lower extremities  Neuro-alert, oriented x3, no focal deficit noted   Status is: Inpatient  Dispo: The patient is from: Home              Anticipated d/c is to: Home              Anticipated d/c date is: 12/25/2020              Patient currently not stable for discharge  Barrier to discharge-ongoing evaluation for TIA  COVID-19 Labs  No results for input(s): DDIMER,  FERRITIN, LDH, CRP in the last 72 hours.  Lab Results  Component Value Date   [place] NEGATIVE 12/22/2020    Microbiology  Recent Results (from the past 240 hour(s))  Resp Panel by RT-PCR (Flu A&B, Covid) Nasopharyngeal Swab     Status:  None   Collection Time: 12/22/20  7:37 PM   Specimen: Nasopharyngeal Swab; Nasopharyngeal(NP) swabs in vial transport medium  Result Value Ref Range Status   SARS Coronavirus 2 by RT PCR NEGATIVE NEGATIVE Final    Comment: (NOTE) SARS-CoV-2 target nucleic acids are NOT DETECTED.  The SARS-CoV-2 RNA is generally detectable in upper respiratory specimens during the acute phase of infection. The lowest concentration of SARS-CoV-2 viral copies this assay can detect is 138 copies/mL. A negative result does not preclude SARS-Cov-2 infection and should not be used as the sole basis for treatment or other patient management decisions. A negative result may occur with  improper specimen collection/handling, submission of specimen other than nasopharyngeal swab, presence of viral mutation(s) within the areas targeted by this assay, and inadequate number of viral copies(<138 copies/mL). A negative result must be combined with clinical observations, patient history, and epidemiological information. The expected result is Negative.  Fact Sheet for Patients:  EntrepreneurPulse.com.au  Fact Sheet for Healthcare Providers:  IncredibleEmployment.be  This test is no t yet approved or cleared by the [place] FDA and  has been authorized for detection and/or diagnosis of SARS-CoV-2 by FDA under an Emergency Use Authorization (EUA). This EUA will remain  in effect (meaning this test can be used) for the duration of the COVID-19 declaration under Section 564(b)(1) of the Act, 21 U.S.C.section 360bbb-3(b)(1), unless the authorization is terminated  or revoked sooner.       Influenza A by PCR NEGATIVE NEGATIVE Final    Influenza B by PCR NEGATIVE NEGATIVE Final    Comment: (NOTE) The Xpert Xpress SARS-CoV-2/FLU/RSV plus assay is intended as an aid in the diagnosis of influenza from Nasopharyngeal swab specimens and should not be used as a sole basis for treatment. Nasal washings and aspirates are unacceptable for Xpert Xpress SARS-CoV-2/FLU/RSV testing.  Fact Sheet for Patients: EntrepreneurPulse.com.au  Fact Sheet for Healthcare Providers: IncredibleEmployment.be  This test is not yet approved or cleared by the [place] FDA and has been authorized for detection and/or diagnosis of SARS-CoV-2 by FDA under an Emergency Use Authorization (EUA). This EUA will remain in effect (meaning this test can be used) for the duration of the COVID-19 declaration under Section 564(b)(1) of the Act, 21 U.S.C. section 360bbb-3(b)(1), unless the authorization is terminated or revoked.  Performed at [facility], [place] [address] [place], [place] [address]              [facility] If 7PM-7AM, please contact night-coverage at [email or web address], Office  [phone]   12/24/2020, 3:45 PM  LOS: 0 days

## 2020-12-24 NOTE — Care Management Obs Status (Signed)
[facility] NOTIFICATION   Patient Details  Name: Caitlin Irwin MRN: [id number] Date of Birth: [birth year]   Medicare Observation Status Notification Given:  Yes    Pollie Friar, RN 12/24/2020, 3:44 PM

## 2020-12-24 NOTE — Plan of Care (Signed)

## 2020-12-25 DIAGNOSIS — R55 Syncope and collapse: Secondary | ICD-10-CM

## 2020-12-25 DIAGNOSIS — R29818 Other symptoms and signs involving the nervous system: Secondary | ICD-10-CM | POA: Diagnosis not present

## 2020-12-25 DIAGNOSIS — E039 Hypothyroidism, unspecified: Secondary | ICD-10-CM | POA: Diagnosis not present

## 2020-12-25 LAB — BASIC METABOLIC PANEL
Anion gap: 4 — ABNORMAL LOW (ref 5–15)
BUN: 11 mg/dL (ref 8–23)
CO2: 24 mmol/L (ref 22–32)
Calcium: 9.4 mg/dL (ref 8.9–10.3)
Chloride: 109 mmol/L (ref 98–111)
Creatinine, Ser: 1.19 mg/dL — ABNORMAL HIGH (ref 0.44–1.00)
GFR, Estimated: 49 mL/min — ABNORMAL LOW (ref >60)
Glucose, Bld: 99 mg/dL (ref 70–99)
Potassium: 3.7 mmol/L (ref 3.5–5.1)
Sodium: 137 mmol/L (ref 135–145)

## 2020-12-25 NOTE — TOC Transition Note (Signed)
Transition of Care [facility]) - CM/SW Discharge Note   Patient Details  Name: Caitlin Irwin MRN: [id number] Date of Birth: 09-Oct-1950  [facility] CM/SW Contact:  Pollie Friar, RN Phone Number: 12/25/2020, 10:44 AM   Clinical Narrative:    Patient is discharging home with self care. No needs per TOC.   Final next level of care: Home/Self Care Barriers to Discharge: No Barriers Identified   Patient Goals and CMS Choice        Discharge Placement                       Discharge Plan and Services   Discharge Planning Services: CM Consult                                 Social Determinants of Health (SDOH) Interventions     Readmission Risk Interventions No flowsheet data found.

## 2020-12-25 NOTE — Plan of Care (Signed)
Ambulates min assist, min adls

## 2020-12-25 NOTE — Discharge Summary (Addendum)
Physician Discharge Summary  Caitlin Irwin HGD:[id number] DOB: [birth year] DOA: 12/22/2020  PCP: Levin Erp, MD  Admit date: 12/22/2020 Discharge date: 12/25/2020  Time spent: 50 minutes  Recommendations for Outpatient Follow-up:  1. Follow-up PCP in 1 week 2. Check BMP in one week   Discharge Diagnoses:  Principal Problem:   Transient neurologic deficit Active Problems:   Hypothyroidism   Hypokalemia   Anxiety   Orthostasis   Near syncope   Discharge Condition: Stable  Diet recommendation: Heart healthy diet  There were no vitals filed for this visit.  History of present illness:  70 year old female with a history of trigeminal neuralgia, anxiety, chronic pain, hypothyroidism presented to ED with near syncope, right facial droop, slurred speech and numbness involving left tongue.  Patient said that she was chasing her dog up and down the stairs and then became lightheaded and felt as though she may pass out but did not actually lose consciousness.  Patient's neighbor noted that she had right facial droop and slurred speech and recommended evaluation in the ED.  In the ED she was found to have 49 mm troponin systolic blood pressure upon standing.  EKG showed sinus rhythm with LAFB.  CT head was negative for acute finding.  Potassium was 2.9.  Neurology saw the patient and recommended MRI brain which was normal.  Hospital Course:   1. Near syncope/orthostasis-patient reported he had right facial droop, slurred speech and numbness of left side of tongue.  She continued to have lightheadedness and blood pressure dropped 49 mmHg in SBP on standing.  Started on TED hose bilaterally.  Blood pressure has improved.  2. Mild AKI-patient still has creatinine of 1.19.  Likely mild underlying CKD.  Patient can follow-up with her PCP as outpatient.  3. TIA/dysarthria-neurology saw the patient, no further work-up for stroke ordered as MRI brain was unremarkable.  4. Trigeminal  neuralgia/migraine-continue Topamax, oxcarbazepine.  5. Hypokalemia-replete  6. Hypothyroidism-continue Synthroid   Procedures:  *  Consultations:  Neurology  Discharge Exam: Vitals:   12/25/20 0324 12/25/20 0722  BP: (!) 145/49 118/73  Pulse: 61 66  Resp: (!) 8 16  Temp: 97.9 F (36.6 C) 98.8 F (37.1 C)  SpO2: 99% 100%    General: Appears in no acute distress Cardiovascular: S1-S2, regular Respiratory: Clear to auscultation bilaterally  Discharge Instructions   Discharge Instructions    Diet - low sodium heart healthy   Complete by: As directed    Increase activity slowly   Complete by: As directed    Increase activity slowly   Complete by: As directed      Allergies as of 12/25/2020      Reactions   Acetaminophen Nausea And Vomiting      Medication List    STOP taking these medications   ALPRAZolam 0.5 MG tablet Commonly known as: XANAX   HYDROcodone-acetaminophen 5-325 MG tablet Commonly known as: NORCO/VICODIN   oxyCODONE 5 MG immediate release tablet Commonly known as: Roxicodone   polyethylene glycol 17 g packet Commonly known as: MIRALAX / GLYCOLAX   potassium chloride SA 20 MEQ tablet Commonly known as: KLOR-CON   spironolactone 50 MG tablet Commonly known as: ALDACTONE   tamsulosin 0.4 MG Caps capsule Commonly known as: FLOMAX   traMADol 50 MG tablet Commonly known as: ULTRAM   traZODone 100 MG tablet Commonly known as: DESYREL     TAKE these medications   celecoxib 100 MG capsule Commonly known as: CELEBREX Take 100 mg by mouth 2 (  two) times daily.   diphenhydrAMINE 25 mg capsule Commonly known as: BENADRYL Take 25 mg by mouth every 6 (six) hours as needed (headaches).   estradiol 1 MG tablet Commonly known as: ESTRACE Take 1 mg by mouth daily.   FLUoxetine 40 MG capsule Commonly known as: PROZAC Take 40 mg by mouth daily.   gabapentin 300 MG capsule Commonly known as: Neurontin Take 1 capsule (300 mg total)  by mouth every morning. What changed:   how much to take  when to take this   levothyroxine 75 MCG tablet Commonly known as: SYNTHROID Take 75 mcg by mouth daily before breakfast.   ondansetron 4 MG tablet Commonly known as: ZOFRAN Take 1 tablet (4 mg total) by mouth every 8 (eight) hours as needed for nausea or vomiting.   OXcarbazepine 150 MG tablet Commonly known as: TRILEPTAL Take 150 mg by mouth 2 (two) times daily.   pilocarpine 5 MG tablet Commonly known as: SALAGEN Take 2.5-5 mg by mouth 4 (four) times daily as needed (dry mouth).   senna-docusate 8.6-50 MG tablet Commonly known as: Senokot-S Take 2 tablets by mouth 2 (two) times daily.   SUMAtriptan 100 MG tablet Commonly known as: Imitrex Take 1 tablet (100 mg total) by mouth every 2 (two) hours as needed for migraine. May repeat in 2 hours if headache persists or recurs.   topiramate 50 MG tablet Commonly known as: TOPAMAX Take 50 mg by mouth 2 (two) times daily.      Allergies  Allergen Reactions  . Acetaminophen Nausea And Vomiting      The results of significant diagnostics from this hospitalization (including imaging, microbiology, ancillary and laboratory) are listed below for reference.    Significant Diagnostic Studies: MR BRAIN WO CONTRAST  Result Date: 12/22/2020 CLINICAL DATA:  Initial evaluation for acute TIA. EXAM: MRI HEAD WITHOUT CONTRAST TECHNIQUE: Multiplanar, multiecho pulse sequences of the brain and surrounding structures were obtained without intravenous contrast. COMPARISON:  Prior head CT from earlier the same day as well as previous brain MRI from 10/12/2016. FINDINGS: Brain: Cerebral volume within normal limits for age. No significant cerebral white matter disease. No abnormal foci of restricted diffusion to suggest acute or subacute ischemia. Gray-white matter differentiation maintained. No encephalomalacia to suggest chronic cortical infarction. No evidence for acute or chronic  intracranial hemorrhage. No mass lesion, midline shift or mass effect. No hydrocephalus or extra-axial fluid collection. Postoperative changes from prior left suboccipital craniotomy for trigeminal nerve decompression again seen, stable. Vascular: Major intracranial vascular flow voids are maintained. Skull and upper cervical spine: Craniocervical junction within normal limits. Bone marrow signal intensity normal. No focal marrow replacing lesion. No scalp soft tissue abnormality. Prior left suboccipital craniotomy. Sinuses/Orbits: Patient status post ocular lens replacement on the left. Globes and orbital soft tissues demonstrate no acute finding. Mild scattered mucosal thickening noted within the ethmoidal air cells. Paranasal sinuses are otherwise clear. No significant mastoid effusion. Inner ear structures grossly normal. Other: None. IMPRESSION: 1. No acute intracranial abnormality. 2. Stable postoperative changes from prior left suboccipital craniotomy for trigeminal nerve treatment. Otherwise unremarkable brain MRI for age. Electronically Signed   By: Jeannine Boga M.D.   On: 12/22/2020 21:51   CT HEAD CODE STROKE WO CONTRAST  Result Date: 12/22/2020 CLINICAL DATA:  Code stroke. Facial numbness. Right arm numbness. Speech disturbance. EXAM: CT HEAD WITHOUT CONTRAST TECHNIQUE: Contiguous axial images were obtained from the base of the skull through the vertex without intravenous contrast. COMPARISON:  03/30/2017.  10/12/2016. FINDINGS: Brain: Mild age related volume loss. Mild chronic small-vessel change of the hemispheric white matter. No sign of acute infarction. Previous left occipital craniectomy with high-density material along the inter margin of the cranioplasty which is not changed over time. Brainstem and cerebellum appear otherwise unremarkable. Vascular: No abnormal vascular finding. Skull: Left occipital craniectomy and cranioplasty as described above. Sinuses/Orbits: Normal Other: None  ASPECTS (McNeal Stroke Program Early CT Score) - Ganglionic level infarction (caudate, lentiform nuclei, internal capsule, insula, M1-M3 cortex): 7 - Supraganglionic infarction (M4-M6 cortex): 3 Total score (0-10 with 10 being normal): 10 IMPRESSION: 1. No acute CT finding. Previous left occipital craniectomy and cranioplasty. Chronic high-density material along the inter margin of the cranioplasty has not changed over time. 2. ASPECTS is 10 3. These results were communicated to Dr. Lorrin Goodell At 8:02 pmon 5/24/2022by text page via the [facility] messaging system. Electronically Signed   By: Nelson Chimes M.D.   On: 12/22/2020 20:04    Microbiology: Recent Results (from the past 240 hour(s))  Resp Panel by RT-PCR (Flu A&B, Covid) Nasopharyngeal Swab     Status: None   Collection Time: 12/22/20  7:37 PM   Specimen: Nasopharyngeal Swab; Nasopharyngeal(NP) swabs in vial transport medium  Result Value Ref Range Status   SARS Coronavirus 2 by RT PCR NEGATIVE NEGATIVE Final    Comment: (NOTE) SARS-CoV-2 target nucleic acids are NOT DETECTED.  The SARS-CoV-2 RNA is generally detectable in upper respiratory specimens during the acute phase of infection. The lowest concentration of SARS-CoV-2 viral copies this assay can detect is 138 copies/mL. A negative result does not preclude SARS-Cov-2 infection and should not be used as the sole basis for treatment or other patient management decisions. A negative result may occur with  improper specimen collection/handling, submission of specimen other than nasopharyngeal swab, presence of viral mutation(s) within the areas targeted by this assay, and inadequate number of viral copies(<138 copies/mL). A negative result must be combined with clinical observations, patient history, and epidemiological information. The expected result is Negative.  Fact Sheet for Patients:  EntrepreneurPulse.com.au  Fact Sheet for Healthcare Providers:   IncredibleEmployment.be  This test is no t yet approved or cleared by the [place] FDA and  has been authorized for detection and/or diagnosis of SARS-CoV-2 by FDA under an Emergency Use Authorization (EUA). This EUA will remain  in effect (meaning this test can be used) for the duration of the COVID-19 declaration under Section 564(b)(1) of the Act, 21 U.S.C.section 360bbb-3(b)(1), unless the authorization is terminated  or revoked sooner.       Influenza A by PCR NEGATIVE NEGATIVE Final   Influenza B by PCR NEGATIVE NEGATIVE Final    Comment: (NOTE) The Xpert Xpress SARS-CoV-2/FLU/RSV plus assay is intended as an aid in the diagnosis of influenza from Nasopharyngeal swab specimens and should not be used as a sole basis for treatment. Nasal washings and aspirates are unacceptable for Xpert Xpress SARS-CoV-2/FLU/RSV testing.  Fact Sheet for Patients: EntrepreneurPulse.com.au  Fact Sheet for Healthcare Providers: IncredibleEmployment.be  This test is not yet approved or cleared by the [place] FDA and has been authorized for detection and/or diagnosis of SARS-CoV-2 by FDA under an Emergency Use Authorization (EUA). This EUA will remain in effect (meaning this test can be used) for the duration of the COVID-19 declaration under Section 564(b)(1) of the Act, 21 U.S.C. section 360bbb-3(b)(1), unless the authorization is terminated or revoked.  Performed at [facility], [employer] [address], [place], [place]  [place]: Basic Metabolic Panel: Recent Labs  Lab 12/22/20 1936 12/22/20 1957 12/23/20 0352 12/24/20 0059 12/25/20 0234  NA 134* 137 136 135 137  K 2.9* 2.9* 3.8 5.0 3.7  CL 105 104 109 110 109  CO2 20*  --  21* 21* 24  GLUCOSE 101* 100* 97 96 99  BUN 10 10 7* 10 11  CREATININE 0.88 0.70 0.73 1.10* 1.19*  CALCIUM 9.8  --  9.2 9.4 9.4  MG  --   --  2.4  --   --    Liver Function  Tests: Recent Labs  Lab 12/22/20 1936  AST 19  ALT 16  ALKPHOS 82  BILITOT 1.0  PROT 6.5  ALBUMIN 4.0   No results for input(s): LIPASE, AMYLASE in the last 168 hours. No results for input(s): AMMONIA in the last 168 hours. CBC: Recent Labs  Lab 12/22/20 1936 12/22/20 1957 12/23/20 0352 12/24/20 0059  WBC 9.1  --  7.1 5.5  NEUTROABS 6.0  --   --   --   HGB 15.2* 14.6 13.3 12.9  HCT 45.6 43.0 40.3 40.0  MCV 89.9  --  90.0 90.5  PLT 308  --  248 259   Cardiac Enzymes: No results for input(s): CKTOTAL, CKMB, CKMBINDEX, TROPONINI in the last 168 hours. BNP: BNP (last 3 results) No results for input(s): BNP in the last 8760 hours.  ProBNP (last 3 results) No results for input(s): PROBNP in the last 8760 hours.  CBG: Recent Labs  Lab 12/22/20 1938  GLUCAP 112*       Signed:  Oswald Hillock MD.  [facility] 12/25/2020, 9:12 AM

## 2020-12-25 NOTE — Progress Notes (Signed)
Physical Therapy Treatment Patient Details Name: Caitlin Irwin MRN: [id number] DOB: 03/15/1951 Today's Date: 12/25/2020    History of Present Illness Pt is a 70 y/o female admitted 5/24 secondary to syncopal episode and R facial droop with slurred speech. MRI negative. PMH includes trigeminal neuralgia and GERD.    PT Comments    Patient overall functioning at supervision level for OOB mobility with HHA. Encouraged use of SPC for in home mobility for safety as patient feels she needs to hold on to something when ambulating. Patient agreeable to use of SPC. Educated patient on progressive walking program to build endurance. No PT follow up recommended at this time as patient decline follow up PT services.      Follow Up Recommendations  No PT follow up (patient declines further PT services)     Equipment Recommendations  None recommended by PT    Recommendations for Other Services       Precautions / Restrictions Precautions Precautions: Fall Restrictions Weight Bearing Restrictions: No    Mobility  Bed Mobility Overal bed mobility: Modified Independent                  Transfers Overall transfer level: Needs assistance Equipment used: None Transfers: Sit to/from Stand Sit to Stand: Supervision         General transfer comment: supervision for safety  Ambulation/Gait Ambulation/Gait assistance: Supervision Gait Distance (Feet): 250 Feet Assistive device: 1 person hand held assist Gait Pattern/deviations: Step-through pattern;Decreased stride length Gait velocity: Decreased   General Gait Details: supervision for safety. HHA for support. No overt LOB noted. Improved stability this session   Stairs             Wheelchair Mobility    Modified Rankin (Stroke Patients Only)       Balance Overall balance assessment: Mild deficits observed, not formally tested                                          Cognition  Arousal/Alertness: Awake/alert Behavior During Therapy: WFL for tasks assessed/performed Overall Cognitive Status: Within Functional Limits for tasks assessed                                        Exercises      General Comments        Pertinent Vitals/Pain Pain Assessment: Faces Faces Pain Scale: Hurts a little bit Pain Location: headache Pain Descriptors / Indicators: Headache Pain Intervention(s): Monitored during session    Home Living                      Prior Function            PT Goals (current goals can now be found in the care plan section) Acute Rehab PT Goals Patient Stated Goal: to go home PT Goal Formulation: With patient Time For Goal Achievement: 01/06/21 Potential to Achieve Goals: Good Progress towards PT goals: Progressing toward goals    Frequency    Min 3X/week      PT Plan Current plan remains appropriate    Co-evaluation              AM-PAC PT "6 Clicks" Mobility   Outcome Measure  Help needed turning from your back to your side  while in a flat bed without using bedrails?: None Help needed moving from lying on your back to sitting on the side of a flat bed without using bedrails?: A Little Help needed moving to and from a bed to a chair (including a wheelchair)?: A Little Help needed standing up from a chair using your arms (e.g., wheelchair or bedside chair)?: A Little Help needed to walk in hospital room?: A Little Help needed climbing 3-5 steps with a railing? : A Little 6 Click Score: 19    End of Session Equipment Utilized During Treatment: Gait belt Activity Tolerance: Patient tolerated treatment well Patient left: in bed;with call bell/phone within reach;with bed alarm set Nurse Communication: Mobility status PT Visit Diagnosis: Muscle weakness (generalized) (M62.81)     Time: 2585-2778 PT Time Calculation (min) (ACUTE ONLY): 20 min  Charges:  $Therapeutic Activity: 8-22 mins                      Shandria Clinch A. Gilford Rile PT, DPT Acute Rehabilitation Services Pager [phone] Office [phone]    Linna Hoff 12/25/2020, 9:20 AM

## 2021-04-27 DIAGNOSIS — M545 Low back pain, unspecified: Secondary | ICD-10-CM | POA: Diagnosis not present

## 2021-05-06 DIAGNOSIS — M5416 Radiculopathy, lumbar region: Secondary | ICD-10-CM | POA: Diagnosis not present

## 2021-05-10 ENCOUNTER — Other Ambulatory Visit: Payer: Self-pay

## 2021-05-10 ENCOUNTER — Emergency Department (HOSPITAL_COMMUNITY): Payer: PPO

## 2021-05-10 ENCOUNTER — Encounter (HOSPITAL_COMMUNITY): Payer: Self-pay

## 2021-05-10 ENCOUNTER — Emergency Department (HOSPITAL_COMMUNITY)
Admission: EM | Admit: 2021-05-10 | Discharge: 2021-05-10 | Disposition: A | Discharge status: Home / Self Care | Payer: PPO | Attending: Emergency Medicine | Admitting: Emergency Medicine

## 2021-05-10 DIAGNOSIS — Z5321 Procedure and treatment not carried out due to patient leaving prior to being seen by health care provider: Secondary | ICD-10-CM | POA: Diagnosis not present

## 2021-05-10 DIAGNOSIS — M79669 Pain in unspecified lower leg: Secondary | ICD-10-CM | POA: Insufficient documentation

## 2021-05-10 DIAGNOSIS — R531 Weakness: Secondary | ICD-10-CM | POA: Diagnosis not present

## 2021-05-10 DIAGNOSIS — R9431 Abnormal electrocardiogram [ECG] [EKG]: Secondary | ICD-10-CM | POA: Diagnosis not present

## 2021-05-10 LAB — COMPREHENSIVE METABOLIC PANEL
ALT: 21 U/L (ref 0–44)
AST: 15 U/L (ref 15–41)
Albumin: 4 g/dL (ref 3.5–5.0)
Alkaline Phosphatase: 105 U/L (ref 38–126)
Anion gap: 7 (ref 5–15)
BUN: 17 mg/dL (ref 8–23)
CO2: 23 mmol/L (ref 22–32)
Calcium: 10.2 mg/dL (ref 8.9–10.3)
Chloride: 110 mmol/L (ref 98–111)
Creatinine, Ser: 0.93 mg/dL (ref 0.44–1.00)
GFR, Estimated: >60 mL/min (ref >60)
Glucose, Bld: 89 mg/dL (ref 70–99)
Potassium: 3.4 mmol/L — ABNORMAL LOW (ref 3.5–5.1)
Sodium: 140 mmol/L (ref 135–145)
Total Bilirubin: 0.8 mg/dL (ref 0.3–1.2)
Total Protein: 6.6 g/dL (ref 6.5–8.1)

## 2021-05-10 LAB — CBC WITH DIFFERENTIAL/PLATELET
Abs Immature Granulocytes: 0.06 10*3/uL (ref 0.00–0.07)
Basophils Absolute: 0.1 10*3/uL (ref 0.0–0.1)
Basophils Relative: 1 %
Eosinophils Absolute: 0.2 10*3/uL (ref 0.0–0.5)
Eosinophils Relative: 2 %
HCT: 45.9 % (ref 36.0–46.0)
Hemoglobin: 14.7 g/dL (ref 12.0–15.0)
Immature Granulocytes: 1 %
Lymphocytes Relative: 22 %
Lymphs Abs: 2.4 10*3/uL (ref 0.7–4.0)
MCH: 29.8 pg (ref 26.0–34.0)
MCHC: 32 g/dL (ref 30.0–36.0)
MCV: 92.9 fL (ref 80.0–100.0)
Monocytes Absolute: 0.7 10*3/uL (ref 0.1–1.0)
Monocytes Relative: 6 %
Neutro Abs: 7.3 10*3/uL (ref 1.7–7.7)
Neutrophils Relative %: 68 %
Platelets: 406 10*3/uL — ABNORMAL HIGH (ref 150–400)
RBC: 4.94 MIL/uL (ref 3.87–5.11)
RDW: 13 % (ref 11.5–15.5)
WBC: 10.7 10*3/uL — ABNORMAL HIGH (ref 4.0–10.5)
nRBC: 0 % (ref 0.0–0.2)

## 2021-05-10 LAB — TSH: TSH: 3.909 u[IU]/mL (ref 0.350–4.500)

## 2021-05-10 NOTE — ED Notes (Signed)
No response for vitals  

## 2021-05-10 NOTE — ED Notes (Signed)
Pt called for vitals, no response x2

## 2021-05-10 NOTE — ED Provider Notes (Signed)
States that sheEmergency Medicine Provider Triage Evaluation Note  Caitlin Irwin , a 70 y.o. female  was evaluated in triage.  Pt complains of generalized weakness.  States her symptoms have been ongoing since this morning.  Denies any chest pain shortness of breath lightheadedness or dizziness.  He has been falling this morning because of bilateral leg weakness but states that she is just is weak in her arms that she has in her legs.  Denies any other significant associated symptoms.  No pain currently.  Review of Systems  Positive: Fatigue/weak Negative: Chest pain, short of breath  Physical Exam  BP 138/85   Pulse 79   Temp 98.3 F (36.8 C)   Resp 12   Ht 4\' 11"  (1.499 m)   Wt 52.2 kg   SpO2 100%   BMI 23.23 kg/m  Gen:   Awake, no distress   Resp:  Normal effort  MSK:   Moves extremities without difficulty  Other:    Medical Decision Making  Medically screening exam initiated at 5:13 PM.  Appropriate orders placed.  ARTURO FREUNDLICH was informed that the remainder of the evaluation will be completed by another provider, this initial triage assessment does not replace that evaluation, and the importance of remaining in the ED until their evaluation is complete.  Complaining of generalized fatigue and bilateral leg weakness  Denies any focal back pain.   Caitlin Irwin, [place] 05/10/21 1718    Sherwood Gambler, MD 05/11/21 1525
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
## 2021-05-12 ENCOUNTER — Encounter (HOSPITAL_COMMUNITY): Payer: Self-pay

## 2021-05-12 ENCOUNTER — Emergency Department (HOSPITAL_COMMUNITY)
Admission: EM | Admit: 2021-05-12 | Discharge: 2021-05-12 | Disposition: A | Discharge status: Home / Self Care | Payer: PPO | Attending: Emergency Medicine | Admitting: Emergency Medicine

## 2021-05-12 ENCOUNTER — Other Ambulatory Visit: Payer: Self-pay

## 2021-05-12 ENCOUNTER — Emergency Department (HOSPITAL_COMMUNITY): Payer: PPO

## 2021-05-12 DIAGNOSIS — Z79899 Other long term (current) drug therapy: Secondary | ICD-10-CM | POA: Insufficient documentation

## 2021-05-12 DIAGNOSIS — Y900 Blood alcohol level of less than 20 mg/100 ml: Secondary | ICD-10-CM | POA: Insufficient documentation

## 2021-05-12 DIAGNOSIS — E039 Hypothyroidism, unspecified: Secondary | ICD-10-CM | POA: Diagnosis not present

## 2021-05-12 DIAGNOSIS — R531 Weakness: Secondary | ICD-10-CM | POA: Diagnosis not present

## 2021-05-12 DIAGNOSIS — M25551 Pain in right hip: Secondary | ICD-10-CM | POA: Insufficient documentation

## 2021-05-12 DIAGNOSIS — R410 Disorientation, unspecified: Secondary | ICD-10-CM | POA: Diagnosis not present

## 2021-05-12 DIAGNOSIS — G459 Transient cerebral ischemic attack, unspecified: Secondary | ICD-10-CM | POA: Diagnosis not present

## 2021-05-12 DIAGNOSIS — G8929 Other chronic pain: Secondary | ICD-10-CM | POA: Diagnosis not present

## 2021-05-12 DIAGNOSIS — R9431 Abnormal electrocardiogram [ECG] [EKG]: Secondary | ICD-10-CM | POA: Diagnosis not present

## 2021-05-12 LAB — RAPID URINE DRUG SCREEN, HOSP PERFORMED
Amphetamines: NOT DETECTED
Barbiturates: NOT DETECTED
Benzodiazepines: POSITIVE — AB
Cocaine: NOT DETECTED
Opiates: NOT DETECTED
Tetrahydrocannabinol: NOT DETECTED

## 2021-05-12 LAB — COMPREHENSIVE METABOLIC PANEL
ALT: 15 U/L (ref 0–44)
AST: 20 U/L (ref 15–41)
Albumin: 4 g/dL (ref 3.5–5.0)
Alkaline Phosphatase: 89 U/L (ref 38–126)
Anion gap: 9 (ref 5–15)
BUN: 17 mg/dL (ref 8–23)
CO2: 22 mmol/L (ref 22–32)
Calcium: 10.3 mg/dL (ref 8.9–10.3)
Chloride: 111 mmol/L (ref 98–111)
Creatinine, Ser: 0.77 mg/dL (ref 0.44–1.00)
GFR, Estimated: >60 mL/min (ref >60)
Glucose, Bld: 95 mg/dL (ref 70–99)
Potassium: 3.4 mmol/L — ABNORMAL LOW (ref 3.5–5.1)
Sodium: 142 mmol/L (ref 135–145)
Total Bilirubin: 0.7 mg/dL (ref 0.3–1.2)
Total Protein: 6.8 g/dL (ref 6.5–8.1)

## 2021-05-12 LAB — URINALYSIS, ROUTINE W REFLEX MICROSCOPIC
Bilirubin Urine: NEGATIVE
Glucose, UA: NEGATIVE mg/dL
Hgb urine dipstick: NEGATIVE
Ketones, ur: NEGATIVE mg/dL
Nitrite: NEGATIVE
Protein, ur: NEGATIVE mg/dL
Specific Gravity, Urine: 1.014 (ref 1.005–1.030)
pH: 5 (ref 5.0–8.0)

## 2021-05-12 LAB — CBC WITH DIFFERENTIAL/PLATELET
Abs Immature Granulocytes: 0.03 10*3/uL (ref 0.00–0.07)
Basophils Absolute: 0.1 10*3/uL (ref 0.0–0.1)
Basophils Relative: 1 %
Eosinophils Absolute: 0.1 10*3/uL (ref 0.0–0.5)
Eosinophils Relative: 1 %
HCT: 41.4 % (ref 36.0–46.0)
Hemoglobin: 13.9 g/dL (ref 12.0–15.0)
Immature Granulocytes: 0 %
Lymphocytes Relative: 25 %
Lymphs Abs: 2 10*3/uL (ref 0.7–4.0)
MCH: 29.7 pg (ref 26.0–34.0)
MCHC: 33.6 g/dL (ref 30.0–36.0)
MCV: 88.5 fL (ref 80.0–100.0)
Monocytes Absolute: 0.4 10*3/uL (ref 0.1–1.0)
Monocytes Relative: 5 %
Neutro Abs: 5.4 10*3/uL (ref 1.7–7.7)
Neutrophils Relative %: 68 %
Platelets: 347 10*3/uL (ref 150–400)
RBC: 4.68 MIL/uL (ref 3.87–5.11)
RDW: 12.8 % (ref 11.5–15.5)
WBC: 8 10*3/uL (ref 4.0–10.5)
nRBC: 0 % (ref 0.0–0.2)

## 2021-05-12 LAB — ACETAMINOPHEN LEVEL: Acetaminophen (Tylenol), Serum: <10 ug/mL — ABNORMAL LOW (ref 10–30)

## 2021-05-12 LAB — SALICYLATE LEVEL: Salicylate Lvl: <7 mg/dL — ABNORMAL LOW (ref 7.0–30.0)

## 2021-05-12 LAB — ETHANOL: Alcohol, Ethyl (B): <10 mg/dL (ref <10)

## 2021-05-12 LAB — TROPONIN I (HIGH SENSITIVITY)
Troponin I (High Sensitivity): 4 ng/L (ref <18)
Troponin I (High Sensitivity): 4 ng/L (ref <18)

## 2021-05-12 MED ORDER — SODIUM CHLORIDE 0.9 % IV BOLUS
1000.0000 mL | Freq: Once | INTRAVENOUS | Status: AC
  Administered 2021-05-12: 1000 mL via INTRAVENOUS

## 2021-05-12 NOTE — ED Notes (Signed)
Unable to collect urine output d/t pt having very small urinal output. Will re-attempt at a later time.

## 2021-05-12 NOTE — ED Notes (Signed)
Pt ambulatory to bathroom w/ limited assist.

## 2021-05-12 NOTE — ED Notes (Signed)
Patient demanding to go home, reports we are taking to long, explained the doctor will come speak with her as he has ordered and MRI and that we need urine.  Patient attempting providing a urine but was unable.  Ambulatory to bathroom with stand by assistance.  Patient reporting she can get her sister in law to come get her.  Saw patient at [facility] on Monday and the same outcome happened she came wanting help and complaining of pain and when she doesn't get pain meds she demands to go home.

## 2021-05-12 NOTE — Discharge Instructions (Addendum)
You have been evaluated for your symptoms.  Fortunately no source of infection noted.  X-ray of the right hip did not show any signs of new injury.  Sometimes x-ray may miss small fracture however since you are able to ambulate, consider follow-up with your doctor for further care.  Return if you have any concern.

## 2021-05-12 NOTE — TOC Initial Note (Signed)
Transition of Care [facility]) - Initial/Assessment Note    Patient Details  Name: Caitlin Irwin MRN: [id number] Date of Birth: Nov 16, 1950  [facility] CM/SW Contact:    Leeroy Cha, RN Phone Number: 05/12/2021, 12:35 PM  Clinical Narrative:                 Patient in [facility] for October at 0930 with Dr. Wynetta Emery.  Expected Discharge Plan: Home/Self Care Barriers to Discharge: No Barriers Identified   Patient Goals and CMS Choice Patient states their goals for this hospitalization and ongoing recovery are:: to go home CMS Medicare.gov Compare Post Acute Care list provided to:: Patient Choice offered to / list presented to : Patient  Expected Discharge Plan and Services Expected Discharge Plan: Home/Self Care   Discharge Planning Services: CM Consult, Follow-up appt scheduled, [place] arrangements for the past 2 months: [place]                                      Prior Living Arrangements/Services Living arrangements for the past 2 months: [place] Lives with:: Self Patient language and need for interpreter reviewed:: Yes Do you feel safe going back to the place where you live?: Yes            Criminal Activity/Legal Involvement Pertinent to Current Situation/Hospitalization: No - Comment as needed  Activities of Daily Living      Permission Sought/Granted                  Emotional Assessment Appearance:: Appears stated age     Orientation: : Oriented to Self, Oriented to Place, Oriented to  Time, Oriented to Situation Alcohol / Substance Use: Not Applicable Psych Involvement: No (comment)  Admission diagnosis:  failure to thrive Patient Active Problem List   Diagnosis Date Noted   Near syncope 12/24/2020   Slurred speech    Transient neurologic deficit 12/22/2020   Anxiety    Orthostasis    Closed fracture of proximal end of  right tibia and fibula 11/13/2018   Bilateral temporomandibular joint pain 10/21/2015   Ulceration, tongue traumatic 10/21/2015   Epigastric hernia 09/15/2015   Migraine, intractable 05/28/2015   Migraine 05/28/2015   Headache, migraine    Insomnia    Emesis    History of hypothyroidism    Microcytic hypochromic anemia    Salicylate poisoning 56/43/3295   Migraine headache 08/28/2014   Hypokalemia 18/84/1660   Salicylate intoxication 08/28/2014   Aspirin toxicity    Ataxia 63/08/6008   Complicated grief 93/23/5573   Altered mental status, unspecified altered mental status type 10/13/2013   Polypharmacy 10/13/2013   Depression 10/13/2013   Atypical face pain 02/10/2013   Esophageal reflux 06/12/2012   Trigeminal neuralgia 11/07/2011   Trigeminal neuralgia of left side of face 11/07/2011   Hx of migraine headaches 10/20/2011   Dysphagia, unspecified(787.20) 09/26/2011   Weight gain 09/26/2011   NAUSEA 02/02/2010   DIARRHEA 02/02/2010   CHANGE IN BOWELS 02/02/2010   ULCER-DUODENAL 11/26/2008   History of peptic ulcer 11/26/2008   GASTRITIS 11/26/2008   Hypothyroidism 10/03/2007   CONSTIPATION, CHRONIC 10/03/2007   IRRITABLE BOWEL SYNDROME 10/03/2007   COLITIS, HX OF 10/03/2007   PCP:  System, Provider Not In Pharmacy:   [id number] - [place], [place]. AT  [address] [place] [address] Phone: [phone] Fax: [phone]  [id number] - [address] [place] [address] Phone: [phone] Fax: [phone]     Social Determinants of Health (SDOH) Interventions    Readmission Risk Interventions No flowsheet data found.

## 2021-05-12 NOTE — ED Triage Notes (Signed)
Pt arrived via EMS from home. Pt c/o generalized weakness and pain. Reports difficulty walking and getting around.

## 2021-05-12 NOTE — ED Provider Notes (Signed)
[facility] DEPT Provider Note   CSN: [id number] Arrival date & time: 05/12/21  1033     History No chief complaint on file.   Caitlin Irwin is a 70 y.o. female.  The history is provided by the patient and medical records. No language interpreter was used.   70 year old female significant history of hypothyroidism, irritable bowel syndrome, anemia, chronic low back pain brought here via EMS for concerns of weakness and confusion.  Patient reports she has been having pain to her right hip and trouble ambulating since yesterday due to been taken off her usual opiate pain medication a week ago.  She is taking ibuprofen at home without adequate relief.  She reports she is unable to ambulate due to her hip pain.  Pain in her hip is chronic and related to prior injury.  She does not complain of any fever chills denies nausea vomiting or diarrhea no runny nose sneezing coughing chest pain shortness of breath or dysuria.  However, patient appears to be a poor historian.  She was seen at [facility], ER yesterday with complaints of generalized weakness but left prior to being seen.  Patient denies alcohol or tobacco abuse.  Denies any recent opiate use.  She has been vaccinated for COVID-19 and no recent sick contact.  She lives at home by herself.  She normally ambulates without using assistance but does have a walker at home.  Past Medical History:  Diagnosis Date   Accidental poisoning by aspirin 09/2014   Anemia    Anxiety    Arthritis    "right knee; right shoulder" (09/15/2015)   Ataxia 03/24/2014   Cataract    both eyes   Chronic lower back pain    scoliosis   Depression    GERD (gastroesophageal reflux disease)    H/O hiatal hernia    High cholesterol    History of blood transfusion    "related to OR"   Hypersomnia    IBS (irritable bowel syndrome)    Migraines    "maybe once/week now" (09/15/2015)   Osteoporosis    Other constipation    Parotid  tumor    PONV (postoperative nausea and vomiting)    hx of " getting too much" - 1998, slow to take up by body    PUD (peptic ulcer disease)    Trigeminal neuralgia    S/P radiation therapy    Unspecified hypothyroidism     Patient Active Problem List   Diagnosis Date Noted   Near syncope 12/24/2020   Slurred speech    Transient neurologic deficit 12/22/2020   Anxiety    Orthostasis    Closed fracture of proximal end of right tibia and fibula 11/13/2018   Bilateral temporomandibular joint pain 10/21/2015   Ulceration, tongue traumatic 10/21/2015   Epigastric hernia 09/15/2015   Migraine, intractable 05/28/2015   Migraine 05/28/2015   Headache, migraine    Insomnia    Emesis    History of hypothyroidism    Microcytic hypochromic anemia    Salicylate poisoning 40/98/1191   Migraine headache 08/28/2014   Hypokalemia 47/82/9562   Salicylate intoxication 08/28/2014   Aspirin toxicity    Ataxia 13/03/6577   Complicated grief 46/96/2952   Altered mental status, unspecified altered mental status type 10/13/2013   Polypharmacy 10/13/2013   Depression 10/13/2013   Atypical face pain 02/10/2013   Esophageal reflux 06/12/2012   Trigeminal neuralgia 11/07/2011   Trigeminal neuralgia of left side of face 11/07/2011  Hx of migraine headaches 10/20/2011   Dysphagia, unspecified(787.20) 09/26/2011   Weight gain 09/26/2011   NAUSEA 02/02/2010   DIARRHEA 02/02/2010   CHANGE IN BOWELS 02/02/2010   ULCER-DUODENAL 11/26/2008   History of peptic ulcer 11/26/2008   GASTRITIS 11/26/2008   Hypothyroidism 10/03/2007   CONSTIPATION, CHRONIC 10/03/2007   IRRITABLE BOWEL SYNDROME 10/03/2007   COLITIS, HX OF 10/03/2007    Past Surgical History:  Procedure Laterality Date   ABDOMINAL HYSTERECTOMY     ANKLE FRACTURE SURGERY Left    ANTERIOR LUMBAR FUSION  04/2010   L4-5; L5-S1   BACK SURGERY     BRAIN SURGERY     parotid tumor removed    CHOLECYSTECTOMY OPEN     EPIGASTRIC HERNIA  REPAIR  09/15/2015   Procedure: OPEN HERNIA REPAIR EPIGASTRIC ADULT;  Surgeon: Greer Pickerel, MD;  Location: [facility];  Service: General;;   ESOPHAGOGASTRODUODENOSCOPY  06/12/2012   Procedure: ESOPHAGOGASTRODUODENOSCOPY (EGD);  Surgeon: Lear Ng, MD;  Location: [facility];  Service: Endoscopy;  Laterality: N/A;   EXTERNAL FIXATION LEG Right 11/14/2018   Procedure: EXTERNAL FIXATION LEG;  Surgeon: Hiram Gash, MD;  Location: [facility];  Service: Orthopedics;  Laterality: Right;   FRACTURE SURGERY     gamma knife for trig neuralgia  12/27/11   HERNIA REPAIR     KNEE CARTILAGE SURGERY Right 1981   "converted from scope to open during the OR; ligament repair"   LAMINECTOMY AND MICRODISCECTOMY LUMBAR SPINE  1980   L4-5   LAPAROSCOPIC EPIGASTRIC HERNIA REPAIR  09/15/2015   open   ORIF TIBIA PLATEAU Right 11/16/2018   Procedure: OPEN REDUCTION INTERNAL FIXATION (ORIF) TIBIAL PLATEAU;  Surgeon: Hiram Gash, MD;  Location: [facility];  Service: Orthopedics;  Laterality: Right;   PAROTID GLAND TUMOR EXCISION     benign   SHOULDER ARTHROSCOPY W/ ROTATOR CUFF REPAIR Right 11-2014   TRIGEMINAL NERVE DECOMPRESSION     to stop migraines and it did not work      OB History   No obstetric history on file.     Family History  Problem Relation Age of Onset   Ovarian cancer Mother    Migraines Mother    Diabetes Father    Migraines Brother    Heart disease Other    Heart disease Maternal Grandfather    Colon cancer Neg Hx    Esophageal cancer Neg Hx     Social History   Tobacco Use   Smoking status: Never   Smokeless tobacco: Never  Vaping Use   Vaping Use: Never used  Substance Use Topics   Alcohol use: No   Drug use: No    Home Medications Prior to Admission medications   Medication Sig Start Date End Date Taking? Authorizing Provider  celecoxib (CELEBREX) 100 MG capsule Take 100 mg by mouth 2 (two) times daily.    [provider]  diphenhydrAMINE (BENADRYL) 25 mg capsule  Take 25 mg by mouth every 6 (six) hours as needed (headaches).    [provider]  estradiol (ESTRACE) 1 MG tablet Take 1 mg by mouth daily.  04/21/13   [provider]  FLUoxetine (PROZAC) 40 MG capsule Take 40 mg by mouth daily.    [provider]  gabapentin (NEURONTIN) 300 MG capsule Take 1 capsule (300 mg total) by mouth every morning. Patient taking differently: Take 100 mg by mouth every 8 (eight) hours. 06/09/19   Tegeler, Gwenyth Allegra, MD  levothyroxine (SYNTHROID, [place]) 75 MCG  tablet Take 75 mcg by mouth daily before breakfast.  04/09/17   [provider]  ondansetron (ZOFRAN) 4 MG tablet Take 1 tablet (4 mg total) by mouth every 8 (eight) hours as needed for nausea or vomiting. 01/10/19   Couture, Cortni S, PA-C  OXcarbazepine (TRILEPTAL) 150 MG tablet Take 150 mg by mouth 2 (two) times daily.    [provider]  pilocarpine (SALAGEN) 5 MG tablet Take 2.5-5 mg by mouth 4 (four) times daily as needed (dry mouth). 02/17/15   [provider]  senna-docusate (SENOKOT-S) 8.6-50 MG tablet Take 2 tablets by mouth 2 (two) times daily. Patient not taking: Reported on 12/24/2020 11/20/18   Roxan Hockey, MD  SUMAtriptan (IMITREX) 100 MG tablet Take 1 tablet (100 mg total) by mouth every 2 (two) hours as needed for migraine. May repeat in 2 hours if headache persists or recurs. 06/09/19   Tegeler, Gwenyth Allegra, MD  topiramate (TOPAMAX) 50 MG tablet Take 50 mg by mouth 2 (two) times daily.    [provider]  eletriptan (RELPAX) 40 MG tablet One tablet by mouth as needed for migraine headache.  If the headache improves and then returns, dose may be repeated after 2 hours have elapsed since first dose (do not exceed 80 mg per day). may repeat in 2 hours if necessary  10/20/11  [provider]  ferrous sulfate 325 (65 FE) MG tablet Take 325 mg by mouth daily with breakfast.  10/20/11  [provider]    Allergies     Acetaminophen  Review of Systems   Review of Systems  All other systems reviewed and are negative.  Physical Exam Updated Vital Signs BP 127/73   Pulse 65   Temp 98.3 F (36.8 C) (Oral)   Resp (!) 25   Ht 4\' 11"  (1.499 m)   Wt 50.8 kg   SpO2 99%   BMI 22.62 kg/m   Physical Exam Vitals and nursing note reviewed.  Constitutional:      General: She is not in acute distress.    Appearance: She is well-developed.     Comments: Patient appears drowsy but able to answer question.  HENT:     Head: Normocephalic and atraumatic.     Comments: No signs of head trauma Eyes:     Conjunctiva/sclera: Conjunctivae normal.  Cardiovascular:     Rate and Rhythm: Normal rate and regular rhythm.     Pulses: Normal pulses.     Heart sounds: Normal heart sounds.  Pulmonary:     Effort: Pulmonary effort is normal.  Abdominal:     Palpations: Abdomen is soft.     Tenderness: There is no abdominal tenderness.  Musculoskeletal:        General: Tenderness (Mild tenderness about right hip with normal range of motion and no deformity noted.) present.     Cervical back: Neck supple.  Skin:    Findings: No rash.  Neurological:     Mental Status: She is alert.     GCS: GCS eye subscore is 4. GCS verbal subscore is 5. GCS motor subscore is 6.     Comments: Alert and oriented x1 Able to move all 4 extremities without focal weakness  Psychiatric:        Mood and Affect: Mood normal.    ED Results / Procedures / Treatments   Labs (all labs ordered are listed, but only abnormal results are displayed) Labs Reviewed  ACETAMINOPHEN LEVEL - Abnormal; Notable for the following  components:      Result Value   Acetaminophen (Tylenol), Serum <10 (*)    All other components within normal limits  COMPREHENSIVE METABOLIC PANEL - Abnormal; Notable for the following components:   Potassium 3.4 (*)    All other components within normal limits  SALICYLATE LEVEL - Abnormal; Notable for the following  components:   Salicylate Lvl <6.2 (*)    All other components within normal limits  RAPID URINE DRUG SCREEN, HOSP PERFORMED - Abnormal; Notable for the following components:   Benzodiazepines POSITIVE (*)    All other components within normal limits  URINALYSIS, ROUTINE W REFLEX MICROSCOPIC - Abnormal; Notable for the following components:   APPearance CLOUDY (*)    Leukocytes,Ua TRACE (*)    Bacteria, UA RARE (*)    All other components within normal limits  ETHANOL  CBC WITH DIFFERENTIAL/PLATELET  TROPONIN I (HIGH SENSITIVITY)  TROPONIN I (HIGH SENSITIVITY)    EKG EKG Interpretation  Date/Time:  Wednesday May 12 2021 10:46:46 EDT Ventricular Rate:  75 PR Interval:  160 QRS Duration: 100 QT Interval:  400 QTC Calculation: 447 R Axis:   -48 Text Interpretation: Sinus rhythm Left anterior fascicular block Abnormal R-wave progression, late transition Confirmed by Octaviano Glow [phone]) on 05/12/2021 11:12:02 AM  Radiology DG Chest 2 View  Result Date: 05/10/2021 CLINICAL DATA:  Generalized weakness. EXAM: CHEST - 2 VIEW COMPARISON:  None. FINDINGS: Low lung volumes are seen without evidence of acute infiltrate, pleural effusion or pneumothorax. The heart size and mediastinal contours are within normal limits. A radiopaque septal occlusion device is noted. Inferior subluxation of the right humeral head is noted with respect to the right glenoid. IMPRESSION: 1. No active cardiopulmonary disease. 2. Inferior subluxation of the right humeral head with respect to the right glenoid, likely positional in etiology. Electronically Signed   By: Virgina Norfolk M.D.   On: 05/10/2021 20:05   DG Hip Unilat W or Wo Pelvis 2-3 Views Right  Result Date: 05/12/2021 CLINICAL DATA:  Fall.  Right hip pain. EXAM: DG HIP (WITH OR WITHOUT PELVIS) 2-3V RIGHT COMPARISON:  CT pelvis 01/10/2019 FINDINGS: Postoperative findings of anterior fusion at L4-L5-S1. Bony demineralization. No fracture or  acute bony findings identified. IMPRESSION: 1. No fracture or acute bony findings identified. The patient is unable to bear weight or if otherwise clinically warranted, cross-sectional imaging could be utilized for further characterization. 2. Anterior fusion at L4-L5-S1. Electronically Signed   By: Van Clines M.D.   On: 05/12/2021 12:10    Procedures Procedures   Medications Ordered in ED Medications - No data to display  ED Course  I have reviewed the triage vital signs and the nursing notes.  Pertinent labs & imaging results that were available during my care of the patient were reviewed by me and considered in my medical decision making (see chart for details).    MDM Rules/Calculators/A&P                           BP (!) 118/96   Pulse 77   Temp 98.3 F (36.8 C) (Oral)   Resp 15   Ht 4\' 11"  (1.499 m)   Wt 50.8 kg   SpO2 97%   BMI 22.62 kg/m   Final Clinical Impression(s) / ED Diagnoses Final diagnoses:  Chronic right hip pain  Delirium    Rx / DC Orders ED Discharge Orders     None  10:57 AM Patient complaining of generalized weakness as well as right hip pain after she was taken off her opiate medication for chronic right hip pain.  Patient appears to be confused, but denies any infectious symptoms.  Mild tenderness about the right hip without signs of injury.  She did report falling recently but no loss of consciousness.  Was seen at [facility] yesterday but left prior to being evaluated.  Labs at that time was unremarkable.  History of hypothyroidism, but had normal TSH.  12:26 PM Right hip and pelvis x-ray without any acute fracture or abnormal finding however due to patient unable to bear weight, I discussed care with Dr. Langston Masker and plan to obtain MRI of the right hip to rule out occult fracture.  Since patient is at home on his second story building and having trouble ambulating, will request social worker to assess her as she may need PT  OT.  12:57 PM Nurse informed me that patient was able to ambulate to the bathroom with assistance.  She also request to be discharged.  I encourage patient to let us evaluate further to make sure she does not have an underlying infection causing confusion.  2:49 PM Work-up today has been essentially unremarkable.  Patient able to ambulate.  At first she request for pain medication but due to her confusion, we have avoid opiate medication.  Nurse report that patient now is alert and oriented x4 answer questions appropriately and request to be discharged.  No infectious etiology identified.  Patient denies taking any kind of benzodiazepine however she does have positive UDS for benzodiazepine.  This medication can potentially cause drowsiness and confusion.  However at this time patient is answering questions appropriately, able to ambulate, stable for discharge.  At this time I have low suspicion for occult hip or back fracture.  Return precaution given   Domenic Moras, PA-C 05/12/21 1454    Wyvonnia Dusky, MD 05/12/21 [phone]

## 2021-05-20 DIAGNOSIS — M810 Age-related osteoporosis without current pathological fracture: Secondary | ICD-10-CM | POA: Diagnosis not present

## 2021-05-20 DIAGNOSIS — G43009 Migraine without aura, not intractable, without status migrainosus: Secondary | ICD-10-CM | POA: Diagnosis not present

## 2021-05-20 DIAGNOSIS — E039 Hypothyroidism, unspecified: Secondary | ICD-10-CM | POA: Diagnosis not present

## 2021-05-20 DIAGNOSIS — R413 Other amnesia: Secondary | ICD-10-CM | POA: Diagnosis not present

## 2021-05-20 DIAGNOSIS — G500 Trigeminal neuralgia: Secondary | ICD-10-CM | POA: Diagnosis not present

## 2021-05-20 DIAGNOSIS — F339 Major depressive disorder, recurrent, unspecified: Secondary | ICD-10-CM | POA: Diagnosis not present

## 2021-05-20 DIAGNOSIS — Z23 Encounter for immunization: Secondary | ICD-10-CM | POA: Diagnosis not present

## 2021-05-20 DIAGNOSIS — F419 Anxiety disorder, unspecified: Secondary | ICD-10-CM | POA: Diagnosis not present

## 2021-05-25 ENCOUNTER — Other Ambulatory Visit: Payer: Self-pay | Admitting: Internal Medicine

## 2021-05-25 DIAGNOSIS — M810 Age-related osteoporosis without current pathological fracture: Secondary | ICD-10-CM

## 2021-05-31 ENCOUNTER — Inpatient Hospital Stay: Payer: PPO | Admitting: Internal Medicine

## 2021-06-11 ENCOUNTER — Other Ambulatory Visit: Payer: Self-pay | Admitting: Internal Medicine

## 2021-06-11 DIAGNOSIS — Z1231 Encounter for screening mammogram for malignant neoplasm of breast: Secondary | ICD-10-CM

## 2021-06-25 ENCOUNTER — Other Ambulatory Visit (HOSPITAL_COMMUNITY): Payer: Self-pay | Admitting: *Deleted

## 2021-06-26 ENCOUNTER — Encounter (HOSPITAL_COMMUNITY): Payer: Self-pay | Admitting: Emergency Medicine

## 2021-06-26 ENCOUNTER — Observation Stay (HOSPITAL_COMMUNITY)
Admission: EM | Admit: 2021-06-26 | Discharge: 2021-06-27 | Disposition: A | Discharge status: Home / Self Care | Payer: PPO | Attending: Family Medicine | Admitting: Family Medicine

## 2021-06-26 ENCOUNTER — Emergency Department (HOSPITAL_COMMUNITY): Payer: PPO

## 2021-06-26 ENCOUNTER — Other Ambulatory Visit: Payer: Self-pay

## 2021-06-26 ENCOUNTER — Observation Stay (HOSPITAL_COMMUNITY): Payer: PPO

## 2021-06-26 DIAGNOSIS — R29898 Other symptoms and signs involving the musculoskeletal system: Secondary | ICD-10-CM | POA: Diagnosis not present

## 2021-06-26 DIAGNOSIS — Z8669 Personal history of other diseases of the nervous system and sense organs: Secondary | ICD-10-CM

## 2021-06-26 DIAGNOSIS — Z20822 Contact with and (suspected) exposure to covid-19: Secondary | ICD-10-CM | POA: Insufficient documentation

## 2021-06-26 DIAGNOSIS — M5126 Other intervertebral disc displacement, lumbar region: Secondary | ICD-10-CM | POA: Diagnosis not present

## 2021-06-26 DIAGNOSIS — Z79899 Other long term (current) drug therapy: Secondary | ICD-10-CM | POA: Insufficient documentation

## 2021-06-26 DIAGNOSIS — R42 Dizziness and giddiness: Secondary | ICD-10-CM

## 2021-06-26 DIAGNOSIS — R531 Weakness: Secondary | ICD-10-CM | POA: Diagnosis not present

## 2021-06-26 DIAGNOSIS — F32A Depression, unspecified: Secondary | ICD-10-CM | POA: Diagnosis present

## 2021-06-26 DIAGNOSIS — F418 Other specified anxiety disorders: Secondary | ICD-10-CM | POA: Diagnosis present

## 2021-06-26 DIAGNOSIS — I10 Essential (primary) hypertension: Secondary | ICD-10-CM | POA: Diagnosis not present

## 2021-06-26 DIAGNOSIS — K589 Irritable bowel syndrome without diarrhea: Secondary | ICD-10-CM | POA: Diagnosis present

## 2021-06-26 DIAGNOSIS — G319 Degenerative disease of nervous system, unspecified: Secondary | ICD-10-CM | POA: Diagnosis not present

## 2021-06-26 DIAGNOSIS — E039 Hypothyroidism, unspecified: Secondary | ICD-10-CM | POA: Diagnosis present

## 2021-06-26 DIAGNOSIS — G500 Trigeminal neuralgia: Secondary | ICD-10-CM | POA: Diagnosis present

## 2021-06-26 DIAGNOSIS — I959 Hypotension, unspecified: Secondary | ICD-10-CM | POA: Diagnosis not present

## 2021-06-26 LAB — HEPATIC FUNCTION PANEL
ALT: 33 U/L (ref 0–44)
AST: 26 U/L (ref 15–41)
Albumin: 3.7 g/dL (ref 3.5–5.0)
Alkaline Phosphatase: 92 U/L (ref 38–126)
Bilirubin, Direct: <0.1 mg/dL (ref 0.0–0.2)
Total Bilirubin: 0.4 mg/dL (ref 0.3–1.2)
Total Protein: 6.2 g/dL — ABNORMAL LOW (ref 6.5–8.1)

## 2021-06-26 LAB — BASIC METABOLIC PANEL
Anion gap: 6 (ref 5–15)
BUN: 17 mg/dL (ref 8–23)
CO2: 27 mmol/L (ref 22–32)
Calcium: 10.3 mg/dL (ref 8.9–10.3)
Chloride: 107 mmol/L (ref 98–111)
Creatinine, Ser: 0.94 mg/dL (ref 0.44–1.00)
GFR, Estimated: >60 mL/min (ref >60)
Glucose, Bld: 72 mg/dL (ref 70–99)
Potassium: 3.8 mmol/L (ref 3.5–5.1)
Sodium: 140 mmol/L (ref 135–145)

## 2021-06-26 LAB — RESP PANEL BY RT-PCR (FLU A&B, COVID) ARPGX2
Influenza A by PCR: NEGATIVE
Influenza B by PCR: NEGATIVE
SARS Coronavirus 2 by RT PCR: NEGATIVE

## 2021-06-26 LAB — CBC
HCT: 40.2 % (ref 36.0–46.0)
Hemoglobin: 12.9 g/dL (ref 12.0–15.0)
MCH: 29.9 pg (ref 26.0–34.0)
MCHC: 32.1 g/dL (ref 30.0–36.0)
MCV: 93.3 fL (ref 80.0–100.0)
Platelets: 308 10*3/uL (ref 150–400)
RBC: 4.31 MIL/uL (ref 3.87–5.11)
RDW: 12.7 % (ref 11.5–15.5)
WBC: 7.3 10*3/uL (ref 4.0–10.5)
nRBC: 0 % (ref 0.0–0.2)

## 2021-06-26 LAB — CBG MONITORING, ED: Glucose-Capillary: 87 mg/dL (ref 70–99)

## 2021-06-26 LAB — LIPASE, BLOOD: Lipase: 38 U/L (ref 11–51)

## 2021-06-26 MED ORDER — LEVOTHYROXINE SODIUM 75 MCG PO TABS
75.0000 ug | ORAL_TABLET | Freq: Every day | ORAL | Status: DC
  Administered 2021-06-27: 06:00:00 75 ug via ORAL
  Filled 2021-06-26: qty 1

## 2021-06-26 MED ORDER — ADULT MULTIVITAMIN W/MINERALS CH
1.0000 | ORAL_TABLET | Freq: Every day | ORAL | Status: DC
  Administered 2021-06-26 – 2021-06-27 (×2): 1 via ORAL
  Filled 2021-06-26 (×2): qty 1

## 2021-06-26 MED ORDER — SUMATRIPTAN SUCCINATE 100 MG PO TABS: 100.0000 mg | ORAL_TABLET | ORAL | Status: DC | PRN

## 2021-06-26 MED ORDER — MECLIZINE HCL 12.5 MG PO TABS: 25.0000 mg | ORAL_TABLET | Freq: Three times a day (TID) | ORAL | Status: DC | PRN

## 2021-06-26 MED ORDER — ALPRAZOLAM 0.25 MG PO TABS: 0.5000 mg | ORAL_TABLET | Freq: Every evening | ORAL | Status: DC | PRN

## 2021-06-26 MED ORDER — ENOXAPARIN SODIUM 40 MG/0.4ML IJ SOSY
40.0000 mg | PREFILLED_SYRINGE | INTRAMUSCULAR | Status: DC
  Administered 2021-06-26: 40 mg via SUBCUTANEOUS
  Filled 2021-06-26 (×2): qty 0.4

## 2021-06-26 MED ORDER — TOPIRAMATE 25 MG PO TABS
50.0000 mg | ORAL_TABLET | Freq: Two times a day (BID) | ORAL | Status: DC
  Administered 2021-06-26 – 2021-06-27 (×3): 50 mg via ORAL
  Filled 2021-06-26 (×3): qty 2

## 2021-06-26 MED ORDER — SODIUM CHLORIDE 0.9 % IV SOLN: INTRAVENOUS | Status: AC

## 2021-06-26 MED ORDER — SODIUM CHLORIDE 0.9% FLUSH: 3.0000 mL | Freq: Once | INTRAVENOUS | Status: DC

## 2021-06-26 MED ORDER — LORAZEPAM 2 MG/ML IJ SOLN
1.0000 mg | Freq: Once | INTRAMUSCULAR | Status: AC
  Administered 2021-06-26: 1 mg via INTRAVENOUS
  Filled 2021-06-26: qty 1

## 2021-06-26 MED ORDER — FLUOXETINE HCL 20 MG PO CAPS
40.0000 mg | ORAL_CAPSULE | Freq: Every day | ORAL | Status: DC
  Administered 2021-06-26 – 2021-06-27 (×2): 40 mg via ORAL
  Filled 2021-06-26 (×2): qty 2

## 2021-06-26 MED ORDER — ESTRADIOL 1 MG PO TABS
1.0000 mg | ORAL_TABLET | Freq: Every day | ORAL | Status: DC
  Administered 2021-06-26 – 2021-06-27 (×2): 1 mg via ORAL
  Filled 2021-06-26 (×2): qty 1

## 2021-06-26 MED ORDER — GABAPENTIN 100 MG PO CAPS
100.0000 mg | ORAL_CAPSULE | Freq: Three times a day (TID) | ORAL | Status: DC
  Administered 2021-06-26 – 2021-06-27 (×3): 100 mg via ORAL
  Filled 2021-06-26 (×3): qty 1

## 2021-06-26 MED ORDER — SODIUM CHLORIDE 0.9 % IV BOLUS
500.0000 mL | Freq: Once | INTRAVENOUS | Status: AC
  Administered 2021-06-26: 500 mL via INTRAVENOUS

## 2021-06-26 NOTE — H&P (Addendum)
History and Physical    Caitlin Irwin KLK:[id number] DOB: 1950/10/12 DOA: 06/26/2021  PCP: Dr. Nyoka Cowden  Consultants:  neurology: Dr. Brett Fairy, pain: Dr. Catheryn Bacon Patient coming from:  Home - lives alone   Chief Complaint: dizziness and bilateral LE weakness   HPI: Caitlin Irwin is a 70 y.o. female with medical history significant of left trigeminal neuralgia, migraines, anxiety and depression, hypothyroidism, IBS-C who presented to ED with 3 day history of bilateral leg weakness that was acutely worse this AM. She woke up and fell to the ground as her legs just gave out. Over the past two days they have been weak, but she could walk and had 2 episodes where she "slid" down to the ground. She denies any loss of sensation, urinary incontinence, saddle paraesthesias. History of back surgery (anterior lumbar fusion) L4-L5 and L5-S1. Denies any family history of neurological disease. Has a noted history of ataxia in her chart from 29.   She also has had a 3 week hx of intermittent fleeting dizziness with postural changes. Only lasts a few seconds and goes away once she is lying still. Doesn't think it's worse with head movement left or right. Denies any recent ear infection, sinus infection/URI, air travel, surgery. Does have past history of BPPV.   She denies any fever/chills, chest pain or palpitations, shortness of breath, cough, stomach pain, N/V/D, leg swelling, dysuria. She has been eating and drinking well.    ED Course: vitals: afebrile, bp: 108/67, HR: 86, RR: 15, oxygen: 95% room air Pertinent labs: none. UA, lipase and hepatic fx pending CXR: no acute finding MRI brain: prematurely terminated and mildly motion degraded exam. No acute intracranial abnormality. Post-op changes form prior left suboccipital craniotomy for trigeminal nerve treatment. Mild generalized parenchymal atrophy, stable from MRI of 5/22.  In ED neuro consulted, bolused 500cc and [facility] was asked to admit.   Review  of Systems: As per HPI; otherwise review of systems reviewed and negative.   Ambulatory Status:  Ambulates without assistance   Past Medical History:  Diagnosis Date   Accidental poisoning by aspirin 09/2014   Anemia    Anxiety    Arthritis    "right knee; right shoulder" (09/15/2015)   Ataxia 03/24/2014   Cataract    both eyes   Chronic lower back pain    scoliosis   Depression    GERD (gastroesophageal reflux disease)    H/O hiatal hernia    High cholesterol    History of blood transfusion    "related to OR"   Hypersomnia    IBS (irritable bowel syndrome)    Migraines    "maybe once/week now" (09/15/2015)   Osteoporosis    Other constipation    Parotid tumor    PONV (postoperative nausea and vomiting)    hx of " getting too much" - 1998, slow to take up by body    PUD (peptic ulcer disease)    Trigeminal neuralgia    S/P radiation therapy    Unspecified hypothyroidism     Past Surgical History:  Procedure Laterality Date   ABDOMINAL HYSTERECTOMY     ANKLE FRACTURE SURGERY Left    ANTERIOR LUMBAR FUSION  04/2010   L4-5; L5-S1   BACK SURGERY     BRAIN SURGERY     parotid tumor removed    CHOLECYSTECTOMY OPEN     EPIGASTRIC HERNIA REPAIR  09/15/2015   Procedure: OPEN HERNIA REPAIR EPIGASTRIC ADULT;  Surgeon: Greer Pickerel, MD;  Location: [facility]  OR;  Service: General;;   ESOPHAGOGASTRODUODENOSCOPY  06/12/2012   Procedure: ESOPHAGOGASTRODUODENOSCOPY (EGD);  Surgeon: Lear Ng, MD;  Location: [facility];  Service: Endoscopy;  Laterality: N/A;   EXTERNAL FIXATION LEG Right 11/14/2018   Procedure: EXTERNAL FIXATION LEG;  Surgeon: Hiram Gash, MD;  Location: [place];  Service: Orthopedics;  Laterality: Right;   FRACTURE SURGERY     gamma knife for trig neuralgia  12/27/11   HERNIA REPAIR     KNEE CARTILAGE SURGERY Right 1981   "converted from scope to open during the OR; ligament repair"   LAMINECTOMY AND MICRODISCECTOMY LUMBAR SPINE  1980   L4-5   LAPAROSCOPIC  EPIGASTRIC HERNIA REPAIR  09/15/2015   open   ORIF TIBIA PLATEAU Right 11/16/2018   Procedure: OPEN REDUCTION INTERNAL FIXATION (ORIF) TIBIAL PLATEAU;  Surgeon: Hiram Gash, MD;  Location: [place];  Service: Orthopedics;  Laterality: Right;   PAROTID GLAND TUMOR EXCISION     benign   SHOULDER ARTHROSCOPY W/ ROTATOR CUFF REPAIR Right 11-2014   TRIGEMINAL NERVE DECOMPRESSION     to stop migraines and it did not work     Social History   Socioeconomic History   Marital status: Widowed    Spouse name: Not on file   Number of children: 0   Years of education: college   Highest education level: Not on file  Occupational History   Occupation: disabled  Tobacco Use   Smoking status: Never   Smokeless tobacco: Never  Vaping Use   Vaping Use: Never used  Substance and Sexual Activity   Alcohol use: No   Drug use: No   Sexual activity: Not Currently  Other Topics Concern   Not on file  Social History Narrative   Patient lives at home alone.    Patient husband past away 07-03-13.   Patient is widowed.   Patient is on disability.    Patient is right handed.   Patient has no children.   Patient is a college grad.   Social Determinants of Health   Financial Resource Strain: Not on file  Food Insecurity: Not on file  Transportation Needs: Not on file  Physical Activity: Not on file  Stress: Not on file  Social Connections: Not on file  Intimate Partner Violence: Not on file    Allergies  Allergen Reactions   Acetaminophen Nausea And Vomiting    Family History  Problem Relation Age of Onset   Ovarian cancer Mother    Migraines Mother    Diabetes Father    Migraines Brother    Heart disease Other    Heart disease Maternal Grandfather    Colon cancer Neg Hx    Esophageal cancer Neg Hx     Prior to Admission medications   Medication Sig Start Date End Date Taking? Authorizing Provider  celecoxib (CELEBREX) 100 MG capsule Take 100 mg by mouth 2 (two) times daily.     [provider]  diphenhydrAMINE (BENADRYL) 25 mg capsule Take 25 mg by mouth every 6 (six) hours as needed (headaches).    [provider]  estradiol (ESTRACE) 1 MG tablet Take 1 mg by mouth daily.  04/21/13   [provider]  FLUoxetine (PROZAC) 40 MG capsule Take 40 mg by mouth daily.    [provider]  gabapentin (NEURONTIN) 300 MG capsule Take 1 capsule (300 mg total) by mouth every morning. Patient taking differently: Take 100 mg by mouth every 8 (eight) hours. 06/09/19  Tegeler, Gwenyth Allegra, MD  levothyroxine (SYNTHROID, LEVOTHROID) 75 MCG tablet Take 75 mcg by mouth daily before breakfast.  04/09/17   [provider]  ondansetron (ZOFRAN) 4 MG tablet Take 1 tablet (4 mg total) by mouth every 8 (eight) hours as needed for nausea or vomiting. 01/10/19   Couture, Cortni S, PA-C  OXcarbazepine (TRILEPTAL) 150 MG tablet Take 150 mg by mouth 2 (two) times daily.    [provider]  pilocarpine (SALAGEN) 5 MG tablet Take 2.5-5 mg by mouth 4 (four) times daily as needed (dry mouth). 02/17/15   [provider]  senna-docusate (SENOKOT-S) 8.6-50 MG tablet Take 2 tablets by mouth 2 (two) times daily. Patient not taking: Reported on 12/24/2020 11/20/18   Roxan Hockey, MD  SUMAtriptan (IMITREX) 100 MG tablet Take 1 tablet (100 mg total) by mouth every 2 (two) hours as needed for migraine. May repeat in 2 hours if headache persists or recurs. 06/09/19   Tegeler, Gwenyth Allegra, MD  topiramate (TOPAMAX) 50 MG tablet Take 50 mg by mouth 2 (two) times daily.    [provider]  eletriptan (RELPAX) 40 MG tablet One tablet by mouth as needed for migraine headache.  If the headache improves and then returns, dose may be repeated after 2 hours have elapsed since first dose (do not exceed 80 mg per day). may repeat in 2 hours if necessary  10/20/11  [provider]  ferrous sulfate 325 (65 FE) MG tablet Take 325 mg by mouth daily with  breakfast.  10/20/11  [provider]    Physical Exam: Vitals:   06/26/21 0900 06/26/21 0915 06/26/21 1215 06/26/21 1230  BP:  116/65 111/74 103/89  Pulse: 78 79 87 73  Resp: (!) 21 (!) 24 19 17   Temp:      TempSrc:      SpO2: 97% 97% 99% 100%  Weight:      Height:         General:  Appears calm and comfortable and is in NAD Eyes:  PERRL, EOMI, normal lids, iris ENT:  grossly normal hearing, lips & tongue, mmm; appropriate dentition. Bilateral TM pearly with light reflex  Neck:  no LAD, masses or thyromegaly; no carotid bruits Cardiovascular:  RRR, no m/r/g. No LE edema.  Respiratory:   CTA bilaterally with no wheezes/rales/rhonchi.  Normal respiratory effort. Abdomen:  soft, NT, ND, NABS Back:   normal alignment, no CVAT Skin:  no rash or induration seen on limited exam Musculoskeletal:  grossly normal tone BUE/BLE, good ROM, no bony abnormality Lower extremity:  No LE edema.  Limited foot exam with no ulcerations.  2+ distal pulses. Psychiatric:  grossly normal mood and affect, speech fluent and appropriate, AOx3 Neurologic:  CN 2-12 grossly intact, moves all extremities in coordinated fashion, sensation intact. Left DTR 2+, right knee DTR can no illicit due surgery. Gait slow, but steady. Does have a small shuffle. Right knee turns inward from surgery. Dix halpike + to the left     Radiological Exams on Admission: Independently reviewed - see discussion in A/P where applicable  DG Chest 2 View  Result Date: 06/26/2021 CLINICAL DATA:  C/o dizziness and generalized weakness x 3 days. Hx of vertigo but doesn't take any meds for it. Several falls over the past few days- denies injury. (Per er triage note) EXAM: CHEST - 2 VIEW COMPARISON:  05/10/2021 FINDINGS: Cardiac silhouette is normal in size. The SD occlusion device is stable. No mediastinal or hilar masses or  evidence of adenopathy. Lungs are clear.  No pleural effusion or pneumothorax. Skeletal structures are  demineralized. Previous resection of the distal right clavicle. No acute osseous abnormality. IMPRESSION: No acute cardiopulmonary disease. Electronically Signed   By: Lajean Manes M.D.   On: 06/26/2021 09:42   MR BRAIN WO CONTRAST  Result Date: 06/26/2021 CLINICAL DATA:  Neuro deficit, acute, stroke suspected. EXAM: MRI HEAD WITHOUT CONTRAST TECHNIQUE: Multiplanar, multiecho pulse sequences of the brain and surrounding structures were obtained without intravenous contrast. COMPARISON:  Brain MRI 12/22/2020.  Head CT 12/22/2020. FINDINGS: Brain: The patient was unable to tolerate the full examination. As a result, the routine axial T1 weighted and coronal T2 weighted sequences could not be obtained. There is intermittent motion degradation of the acquired sequences. Most notably, there is mild motion degradation of the sagittal T1 weighted sequence, mild motion degradation of the axial SWI sequence and mild motion degradation of the axial T2 FLAIR sequence. Mild generalized cerebral and cerebellar atrophy. Stable postoperative changes from previous left suboccipital craniotomy for trigeminal nerve decompression. No cortical encephalomalacia is identified. No significant cerebral white matter disease for age. There is no acute infarct. No evidence of an intracranial mass. No extra-axial fluid collection. No midline shift. Vascular: Maintained flow voids within the proximal large arterial vessels. Skull and upper cervical spine: No focal suspicious marrow lesion. Sinuses/Orbits: Visualized orbits show no acute finding. Prior left lens replacement. No significant paranasal sinus disease. IMPRESSION: Prematurely terminated and mildly motion degraded examination, as described and limiting evaluation. No evidence of acute intracranial abnormality. Redemonstrated postoperative changes from prior left suboccipital craniotomy for trigeminal nerve treatment. Mild generalized parenchymal atrophy, stable from the prior  brain MRI of 12/22/2020. Otherwise unremarkable non-contrast MRI appearance of the brain for age. Electronically Signed   By: Kellie Simmering D.O.   On: 06/26/2021 10:57    EKG: Independently reviewed.  NSR with rate 76; nonspecific ST changes with no evidence of acute ischemia   Labs on Admission: I have personally reviewed the available labs and imaging studies at the time of the admission.  Pertinent labs:  Wnl   Assessment/Plan Principal Problem:   Bilateral leg weakness -70 year old female with 3 day history of worsening bilateral LE weakness with falls -observe on telemetry -neurology consulted, MRI brain negative and MRI lumbar spine pending -on my exam she had bilateral intact strength and gait slow, but steady, she lives alone and with falls feel like it is in her best interest and safety to observe and have PT assess her.  -hx of ataxia in 2015.  -hx of lumbar spinal surgery  -hx of broken right leg in 3 places with surgery   Active Problems:   Dizziness -likely secondary to BPPV. Has history of this and +dix halpike -MRI negative for central cause -trial meclizine prn and would discharge with home exercises  -orthostatics pending-hx of orthostasis in the past. (Admit in 5/22) -gentle IVF   ? Memory loss Clinically she seems to have a different story than what she told the PA. Has been seen in the past for memory loss.  -called and talked to her sister in law who does verify that she does have memory loss. They have done a referral to neurology outpatient.  -does not drive, still does finances.  -they feel like she is okay to still be at home -check b12    Hypothyroidism Recent TSH wnl in 10/22 Continue synthroid    Hx of migraine headaches -continue topamax and  sumatriptan prn     Depression/anxiety -continue prozac  -has xanax listed in chart, but per PMP has not had a px filled since 09/2019 for #30, will not order this to make sure not contributing to weakness/?  Memory     Trigeminal neuralgia of left side of face -s/p surgery, continue Topamax      Body mass index is 22.6 kg/m.    Level of care: Telemetry Medical DVT prophylaxis:  Lovenox Code Status:  Full - confirmed with patient Family Communication: None present; I spoke with the patient's husband by telephone at the time of admission. Disposition Plan:  The patient is from: home  Anticipated d/c is to: home   Patient placed in observation as anticipate less than 2 midnight stay. Requires hospitalization for PT eval and leg weakness work up to ensure safety upon returning home.     Patient is currently: stable  Consults called: neurology/PT  Admission status:  observation    Orma Flaming MD [facility]   How to contact the [facility] Attending or Consulting provider [place] or covering provider during after hours [place], for this patient?  Check the care team in [facility] and look for a) attending/consulting [facility] provider listed and b) the [facility] team listed Log into [email or web address] and use [place]'s universal password to access. If you do not have the password, please contact the hospital operator. Locate the [facility] provider you are looking for under [facility] and page to a number that you can be directly reached. If you still have difficulty reaching the provider, please page the [facility] (Director on Call) for the Hospitalists listed on amion for assistance.   06/26/2021, 1:24 PM

## 2021-06-26 NOTE — ED Notes (Signed)
Iv  rt forearm infiltrated removed

## 2021-06-26 NOTE — ED Provider Notes (Signed)
[facility] EMERGENCY DEPARTMENT Provider Note   CSN: [id number] Arrival date & time: 06/26/21  0813     History Chief Complaint  Patient presents with   Dizziness   Weakness    Caitlin Irwin is a 70 y.o. female presenting for evaluation of generalized weakness and dizziness.  Patient states the past 3 weeks she has had intermittent dizziness, mostly when going from sitting to standing.  Also present when she moves her head from side to side.  Over the past 3 days however, she has had worsened dizziness and weakness.  She states she is dizzy like she is going to pass out.  She states when she is walking she develops weakness in her legs and she has to lower herself to the ground.  This is happened at least once a day.  She has never fallen, hit her head, or sustain any injury.  She denies fever, chills, sore throat, nasal congestion, shortness of breath, chest pain, cough, nausea vomiting, abdominal pain, urinary symptoms, abnormal bowel movements.  She has no recent medication changes. Has been eating like normal.   Patient lives at home by herself.  Does not use a walker or cane.  Additional history obtained per chart review.  Patient with a history of anxiety, chronic low back pain, depression, GERD, trigeminal neuralgia status post suboccipital craniotomy, parotid tumor.     HPI     Past Medical History:  Diagnosis Date   Accidental poisoning by aspirin 09/2014   Anemia    Anxiety    Arthritis    "right knee; right shoulder" (09/15/2015)   Ataxia 03/24/2014   Cataract    both eyes   Chronic lower back pain    scoliosis   Depression    GERD (gastroesophageal reflux disease)    H/O hiatal hernia    High cholesterol    History of blood transfusion    "related to OR"   Hypersomnia    IBS (irritable bowel syndrome)    Migraines    "maybe once/week now" (09/15/2015)   Osteoporosis    Other constipation    Parotid tumor    PONV (postoperative  nausea and vomiting)    hx of " getting too much" - 1998, slow to take up by body    PUD (peptic ulcer disease)    Trigeminal neuralgia    S/P radiation therapy    Unspecified hypothyroidism     Patient Active Problem List   Diagnosis Date Noted   Bilateral leg weakness 06/26/2021   Dizziness 06/26/2021   Slurred speech    Transient neurologic deficit 12/22/2020   Anxiety    Orthostasis    Closed fracture of proximal end of right tibia and fibula 11/13/2018   Bilateral temporomandibular joint pain 10/21/2015   Ulceration, tongue traumatic 10/21/2015   Epigastric hernia 09/15/2015   Headache, migraine    Insomnia    Emesis    Microcytic hypochromic anemia    Salicylate poisoning 99/37/1696   Migraine headache 08/28/2014   Aspirin toxicity    Ataxia 78/93/8101   Complicated grief 75/05/2584   Polypharmacy 10/13/2013   Depression 10/13/2013   Esophageal reflux 06/12/2012   Trigeminal neuralgia of left side of face 11/07/2011   Hx of migraine headaches 10/20/2011   Dysphagia, unspecified(787.20) 09/26/2011   ULCER-DUODENAL 11/26/2008   History of peptic ulcer 11/26/2008   GASTRITIS 11/26/2008   Hypothyroidism 10/03/2007   CONSTIPATION, CHRONIC 10/03/2007   IRRITABLE BOWEL SYNDROME 10/03/2007   COLITIS,  HX OF 10/03/2007    Past Surgical History:  Procedure Laterality Date   ABDOMINAL HYSTERECTOMY     ANKLE FRACTURE SURGERY Left    ANTERIOR LUMBAR FUSION  04/2010   L4-5; L5-S1   BACK SURGERY     BRAIN SURGERY     parotid tumor removed    CHOLECYSTECTOMY OPEN     EPIGASTRIC HERNIA REPAIR  09/15/2015   Procedure: OPEN HERNIA REPAIR EPIGASTRIC ADULT;  Surgeon: Greer Pickerel, MD;  Location: [facility];  Service: General;;   ESOPHAGOGASTRODUODENOSCOPY  06/12/2012   Procedure: ESOPHAGOGASTRODUODENOSCOPY (EGD);  Surgeon: Lear Ng, MD;  Location: [facility];  Service: Endoscopy;  Laterality: N/A;   EXTERNAL FIXATION LEG Right 11/14/2018   Procedure: EXTERNAL FIXATION  LEG;  Surgeon: Hiram Gash, MD;  Location: [facility];  Service: Orthopedics;  Laterality: Right;   FRACTURE SURGERY     gamma knife for trig neuralgia  12/27/11   HERNIA REPAIR     KNEE CARTILAGE SURGERY Right 1981   "converted from scope to open during the OR; ligament repair"   LAMINECTOMY AND MICRODISCECTOMY LUMBAR SPINE  1980   L4-5   LAPAROSCOPIC EPIGASTRIC HERNIA REPAIR  09/15/2015   open   ORIF TIBIA PLATEAU Right 11/16/2018   Procedure: OPEN REDUCTION INTERNAL FIXATION (ORIF) TIBIAL PLATEAU;  Surgeon: Hiram Gash, MD;  Location: [facility];  Service: Orthopedics;  Laterality: Right;   PAROTID GLAND TUMOR EXCISION     benign   SHOULDER ARTHROSCOPY W/ ROTATOR CUFF REPAIR Right 11-2014   TRIGEMINAL NERVE DECOMPRESSION     to stop migraines and it did not work      OB History     Gravida  0   Para  0   Term  0   Preterm  0   AB  0   Living  0      SAB  0   IAB  0   Ectopic  0   Multiple  0   Live Births  0           Family History  Problem Relation Age of Onset   Ovarian cancer Mother    Migraines Mother    Diabetes Father    Migraines Brother    Heart disease Other    Heart disease Maternal Grandfather    Colon cancer Neg Hx    Esophageal cancer Neg Hx     Social History   Tobacco Use   Smoking status: Never   Smokeless tobacco: Never  Vaping Use   Vaping Use: Never used  Substance Use Topics   Alcohol use: No   Drug use: No    Home Medications Prior to Admission medications   Medication Sig Start Date End Date Taking? Authorizing Provider  celecoxib (CELEBREX) 100 MG capsule Take 100 mg by mouth 2 (two) times daily.    [provider]  diphenhydrAMINE (BENADRYL) 25 mg capsule Take 25 mg by mouth every 6 (six) hours as needed (headaches).    [provider]  estradiol (ESTRACE) 1 MG tablet Take 1 mg by mouth daily.  04/21/13   [provider]  FLUoxetine (PROZAC) 40 MG capsule Take 40 mg by mouth daily.    [provider]  gabapentin (NEURONTIN) 300 MG capsule Take 1 capsule (300 mg total) by mouth every morning. Patient taking differently: Take 100 mg by mouth every 8 (eight) hours. 06/09/19   Tegeler, Gwenyth Allegra, MD  levothyroxine (SYNTHROID, LEVOTHROID) 75 MCG tablet Take  75 mcg by mouth daily before breakfast.  04/09/17   [provider]  ondansetron (ZOFRAN) 4 MG tablet Take 1 tablet (4 mg total) by mouth every 8 (eight) hours as needed for nausea or vomiting. 01/10/19   Couture, Cortni S, PA-C  OXcarbazepine (TRILEPTAL) 150 MG tablet Take 150 mg by mouth 2 (two) times daily.    [provider]  pilocarpine (SALAGEN) 5 MG tablet Take 2.5-5 mg by mouth 4 (four) times daily as needed (dry mouth). 02/17/15   [provider]  senna-docusate (SENOKOT-S) 8.6-50 MG tablet Take 2 tablets by mouth 2 (two) times daily. Patient not taking: Reported on 12/24/2020 11/20/18   Roxan Hockey, MD  SUMAtriptan (IMITREX) 100 MG tablet Take 1 tablet (100 mg total) by mouth every 2 (two) hours as needed for migraine. May repeat in 2 hours if headache persists or recurs. 06/09/19   Tegeler, Gwenyth Allegra, MD  topiramate (TOPAMAX) 50 MG tablet Take 50 mg by mouth 2 (two) times daily.    [provider]  eletriptan (RELPAX) 40 MG tablet One tablet by mouth as needed for migraine headache.  If the headache improves and then returns, dose may be repeated after 2 hours have elapsed since first dose (do not exceed 80 mg per day). may repeat in 2 hours if necessary  10/20/11  [provider]  ferrous sulfate 325 (65 FE) MG tablet Take 325 mg by mouth daily with breakfast.  10/20/11  [provider]    Allergies    Acetaminophen  Review of Systems   Review of Systems  Neurological:  Positive for weakness.  All other systems reviewed and are negative.  Physical Exam Updated Vital Signs BP 103/89   Pulse 73   Temp 98.6 F (37 C) (Oral)   Resp 17   Ht 4' 10.5" (1.486  m)   Wt 49.9 kg   SpO2 100%   BMI 22.60 kg/m   Physical Exam Vitals and nursing note reviewed.  Constitutional:      General: She is not in acute distress.    Appearance: Normal appearance.  HENT:     Head: Normocephalic and atraumatic.  Eyes:     Conjunctiva/sclera: Conjunctivae normal.     Pupils: Pupils are equal, round, and reactive to light.  Cardiovascular:     Rate and Rhythm: Normal rate and regular rhythm.     Pulses: Normal pulses.  Pulmonary:     Effort: Pulmonary effort is normal. No respiratory distress.     Breath sounds: Normal breath sounds. No wheezing.     Comments: Speaking in full sentences.  Clear lung sounds in all fields. Abdominal:     General: There is no distension.     Palpations: Abdomen is soft. There is no mass.     Tenderness: There is no abdominal tenderness. There is no guarding or rebound.  Musculoskeletal:        General: Normal range of motion.     Cervical back: Normal range of motion and neck supple.  Skin:    General: Skin is warm and dry.     Capillary Refill: Capillary refill takes less than 2 seconds.  Neurological:     Mental Status: She is alert and oriented to person, place, and time.     GCS: GCS eye subscore is 4. GCS verbal subscore is 5. GCS motor subscore is 6.     Cranial Nerves: Cranial nerves 2-12 are intact.     Sensory: Sensation is  intact.     Coordination: Finger-Nose-Finger Test normal.     Gait: Gait abnormal.     Comments: Diminished reflexes on the R when compared to the L. Strength equal bilaterally. Sensation intact bilaterally.  Abnormal gait, hesitation with the right leg when walking, occasionally shuffling.  High fall risk/unsteady.  Psychiatric:        Mood and Affect: Mood and affect normal.        Speech: Speech normal.        Behavior: Behavior normal.    ED Results / Procedures / Treatments   Labs (all labs ordered are listed, but only abnormal results are displayed) Labs Reviewed  RESP PANEL  BY RT-PCR (FLU A&B, COVID) ARPGX2  BASIC METABOLIC PANEL  CBC  URINALYSIS, ROUTINE W REFLEX MICROSCOPIC  HEPATIC FUNCTION PANEL  LIPASE, BLOOD  CBG MONITORING, ED    EKG EKG Interpretation  Date/Time:  Saturday June 26 2021 08:47:08 EST Ventricular Rate:  76 PR Interval:  153 QRS Duration: 98 QT Interval:  369 QTC Calculation: 415 R Axis:   -39 Text Interpretation: Sinus rhythm Abnormal R-wave progression, late transition Left ventricular hypertrophy No significant change since last tracing Confirmed by Deno Etienne [id number]) on 06/26/2021 8:51:58 AM  Radiology DG Chest 2 View  Result Date: 06/26/2021 CLINICAL DATA:  C/o dizziness and generalized weakness x 3 days. Hx of vertigo but doesn't take any meds for it. Several falls over the past few days- denies injury. (Per er triage note) EXAM: CHEST - 2 VIEW COMPARISON:  05/10/2021 FINDINGS: Cardiac silhouette is normal in size. The SD occlusion device is stable. No mediastinal or hilar masses or evidence of adenopathy. Lungs are clear.  No pleural effusion or pneumothorax. Skeletal structures are demineralized. Previous resection of the distal right clavicle. No acute osseous abnormality. IMPRESSION: No acute cardiopulmonary disease. Electronically Signed   By: Lajean Manes M.D.   On: 06/26/2021 09:42   MR BRAIN WO CONTRAST  Result Date: 06/26/2021 CLINICAL DATA:  Neuro deficit, acute, stroke suspected. EXAM: MRI HEAD WITHOUT CONTRAST TECHNIQUE: Multiplanar, multiecho pulse sequences of the brain and surrounding structures were obtained without intravenous contrast. COMPARISON:  Brain MRI 12/22/2020.  Head CT 12/22/2020. FINDINGS: Brain: The patient was unable to tolerate the full examination. As a result, the routine axial T1 weighted and coronal T2 weighted sequences could not be obtained. There is intermittent motion degradation of the acquired sequences. Most notably, there is mild motion degradation of the sagittal T1 weighted  sequence, mild motion degradation of the axial SWI sequence and mild motion degradation of the axial T2 FLAIR sequence. Mild generalized cerebral and cerebellar atrophy. Stable postoperative changes from previous left suboccipital craniotomy for trigeminal nerve decompression. No cortical encephalomalacia is identified. No significant cerebral white matter disease for age. There is no acute infarct. No evidence of an intracranial mass. No extra-axial fluid collection. No midline shift. Vascular: Maintained flow voids within the proximal large arterial vessels. Skull and upper cervical spine: No focal suspicious marrow lesion. Sinuses/Orbits: Visualized orbits show no acute finding. Prior left lens replacement. No significant paranasal sinus disease. IMPRESSION: Prematurely terminated and mildly motion degraded examination, as described and limiting evaluation. No evidence of acute intracranial abnormality. Redemonstrated postoperative changes from prior left suboccipital craniotomy for trigeminal nerve treatment. Mild generalized parenchymal atrophy, stable from the prior brain MRI of 12/22/2020. Otherwise unremarkable non-contrast MRI appearance of the brain for age. Electronically Signed   By: Kellie Simmering D.O.   On: 06/26/2021 10:57  Procedures Procedures   Medications Ordered in ED Medications  sodium chloride flush (NS) 0.9 % injection 3 mL (0 mLs Intravenous Hold 06/26/21 0903)  sodium chloride 0.9 % bolus 500 mL (has no administration in time range)    ED Course  I have reviewed the triage vital signs and the nursing notes.  Pertinent labs & imaging results that were available during my care of the patient were reviewed by me and considered in my medical decision making (see chart for details).  MDM Rules/Calculators/A&P                           Patient presenting for evaluation of leg weakness resulting in falls.  On exam, patient appears nontoxic.  She does have a normal neuro exam,  decreased reflexes of the right (h/o knee surgery) as well as abnormal gait.  Consider stroke.  Consider electrolyte abnormality, anemia, infection.  Labs interpreted by me, overall reassuring.  Chest x-ray viewed and independently interpreted by me, no pneumonia, pnx, effusion.  MRI negative for acute findings.  Will consult neuro.  Discussed with Andee Lineman from neurology who will consult on the patient, recommends medicine admit. Discussed with Dr. Rogers Blocker from [facility] hospitalist service, patient to be admitted  Final Clinical Impression(s) / ED Diagnoses Final diagnoses:  None    Rx / DC Orders ED Discharge Orders     None        Franchot Heidelberg, PA-C 06/26/21 [place], DO 06/26/21 1505

## 2021-06-26 NOTE — ED Triage Notes (Signed)
Pt to triage via [facility] from home.  C/o dizziness and generalized weakness x 3 days.  Hx of vertigo but doesn't take any meds for it.  Several falls over the past few days- denies injury.

## 2021-06-26 NOTE — ED Notes (Signed)
Report gicen to rn on 3w

## 2021-06-26 NOTE — ED Notes (Signed)
Unable to give report asked to call back in 5 minutes

## 2021-06-27 DIAGNOSIS — R42 Dizziness and giddiness: Secondary | ICD-10-CM | POA: Diagnosis not present

## 2021-06-27 DIAGNOSIS — R29898 Other symptoms and signs involving the musculoskeletal system: Secondary | ICD-10-CM | POA: Diagnosis not present

## 2021-06-27 LAB — CBC
HCT: 36.4 % (ref 36.0–46.0)
Hemoglobin: 11.7 g/dL — ABNORMAL LOW (ref 12.0–15.0)
MCH: 29.8 pg (ref 26.0–34.0)
MCHC: 32.1 g/dL (ref 30.0–36.0)
MCV: 92.9 fL (ref 80.0–100.0)
Platelets: 263 10*3/uL (ref 150–400)
RBC: 3.92 MIL/uL (ref 3.87–5.11)
RDW: 13 % (ref 11.5–15.5)
WBC: 6.1 10*3/uL (ref 4.0–10.5)
nRBC: 0 % (ref 0.0–0.2)

## 2021-06-27 LAB — BASIC METABOLIC PANEL
Anion gap: 4 — ABNORMAL LOW (ref 5–15)
BUN: 13 mg/dL (ref 8–23)
CO2: 25 mmol/L (ref 22–32)
Calcium: 9.4 mg/dL (ref 8.9–10.3)
Chloride: 111 mmol/L (ref 98–111)
Creatinine, Ser: 0.88 mg/dL (ref 0.44–1.00)
GFR, Estimated: >60 mL/min (ref >60)
Glucose, Bld: 110 mg/dL — ABNORMAL HIGH (ref 70–99)
Potassium: 3.7 mmol/L (ref 3.5–5.1)
Sodium: 140 mmol/L (ref 135–145)

## 2021-06-27 LAB — VITAMIN B12: Vitamin B-12: 271 pg/mL (ref 180–914)

## 2021-06-27 MED ORDER — MECLIZINE HCL 25 MG PO TABS: 25.0000 mg | ORAL_TABLET | Freq: Three times a day (TID) | ORAL | 0 refills | Status: DC | PRN

## 2021-06-27 MED ORDER — VITAMIN B-12 1000 MCG PO TABS: 1000.0000 ug | ORAL_TABLET | Freq: Every day | ORAL | 1 refills | Status: AC

## 2021-06-27 MED ORDER — CYANOCOBALAMIN 1000 MCG/ML IJ SOLN
1000.0000 ug | Freq: Once | INTRAMUSCULAR | Status: AC
  Administered 2021-06-27: 12:00:00 1000 ug via SUBCUTANEOUS
  Filled 2021-06-27: qty 1

## 2021-06-27 NOTE — Plan of Care (Signed)

## 2021-06-27 NOTE — TOC Transition Note (Signed)
Transition of Care [facility]) - CM/SW Discharge Note   Patient Details  Name: Caitlin Irwin MRN: [id number] Date of Birth: [birth year]  [facility]) CM/SW Contact:  Bartholomew Crews, RN Phone Number: [phone] 06/27/2021, 4:40 PM   Clinical Narrative:     Unable to reach patient by phone (hospital room or cell phone). Went to bedside to provide notification of observation. Patient already discharged.   Final next level of care: Home/Self Care Barriers to Discharge: No Barriers Identified   Patient Goals and CMS Choice        Discharge Placement                       Discharge Plan and Services                                     Social Determinants of Health (SDOH) Interventions     Readmission Risk Interventions No flowsheet data found.

## 2021-06-27 NOTE — Evaluation (Signed)
Physical Therapy Evaluation Patient Details Name: Caitlin Irwin MRN: [id number] DOB: October 24, 1950 Today's Date: 06/27/2021  History of Present Illness  70yo female who presented on 11/26 wtih BLE weakness, a fall at home, and a 3 week history of intermittent dizziness with positional changes. Admitted for w/u. PMH OA, ataxia, LBP, HLD, IBS, trigeminal neuralgia s/p resection, ankle and LE fractures and surgeries, rotator cuff repair, hx memory loss  Clinical Impression   Patient received in bed, pleasant and cooperative; does have hx of memory loss but very clear and with good recall with me today. Able to mobilize on a S-min guard level without device today, did well with stair training. Orthostatics negative and no dizziness noted this morning. Left up in recliner with all needs met, RN present and attending. Seems a bit deconditioned overall, but I feel this will likely resolve quickly with increased activity during this admission and as she returns to her normal routine at home- will continue to follow in house, but no need for skilled PT f/u at DC.        Recommendations for follow up therapy are one component of a multi-disciplinary discharge planning process, led by the attending physician.  Recommendations may be updated based on patient status, additional functional criteria and insurance authorization.  Follow Up Recommendations No PT follow up    Assistance Recommended at Discharge PRN  Functional Status Assessment Patient has not had a recent decline in their functional status  Equipment Recommendations  None recommended by PT    Recommendations for Other Services       Precautions / Restrictions Precautions Precautions: Fall Restrictions Weight Bearing Restrictions: No      Mobility  Bed Mobility Overal bed mobility: Modified Independent             General bed mobility comments: increased time and effort from flat bed    Transfers Overall transfer level:  Needs assistance Equipment used: None Transfers: Sit to/from Stand Sit to Stand: Supervision           General transfer comment: S for safety/assist for line management, no physical assist given    Ambulation/Gait Ambulation/Gait assistance: Min guard Gait Distance (Feet): 160 Feet (89fx2) Assistive device: None Gait Pattern/deviations: Step-through pattern;Drifts right/left;Trunk flexed Gait velocity: decreased     General Gait Details: slow and steady without device, min guard given for safety but no assist needed  Stairs Stairs: Yes Stairs assistance: Supervision Stair Management: Forwards;Two rails;Alternating pattern Number of Stairs: 6 General stair comments: no difficulty noted with UE support  Wheelchair Mobility    Modified Rankin (Stroke Patients Only)       Balance Overall balance assessment: Mild deficits observed, not formally tested                                           Pertinent Vitals/Pain Pain Assessment: 0-10 Pain Score: 6  Pain Location: chronic pain R knee Pain Descriptors / Indicators: Aching;Discomfort Pain Intervention(s): Limited activity within patient's tolerance;Monitored during session;Repositioned    Home Living Family/patient expects to be discharged to:: Private residence Living Arrangements: Alone Available Help at Discharge: Neighbor;Available PRN/intermittently;Other (Comment) (brother and sister in law live "[place])   Home Access: Stairs to enter Entrance Stairs-Rails: RPsychiatric nurseof Steps: 2 Alternate Level Stairs-Number of Steps: flight Home Layout: Two level Home Equipment: Shower seat;Grab bars - tub/shower;Rolling  Walker (2 wheels) Additional Comments: Reports she has good neighbors that check on her often. Has but does not use RW    Prior Function Prior Level of Function : Independent/Modified Independent                     Hand Dominance         Extremity/Trunk Assessment   Upper Extremity Assessment Upper Extremity Assessment: Overall WFL for tasks assessed    Lower Extremity Assessment Lower Extremity Assessment: Overall WFL for tasks assessed    Cervical / Trunk Assessment Cervical / Trunk Assessment: Kyphotic  Communication   Communication: No difficulties  Cognition Arousal/Alertness: Awake/alert Behavior During Therapy: WFL for tasks assessed/performed;Flat affect Overall Cognitive Status: Within Functional Limits for tasks assessed                                 General Comments: does have hx of memory loss, very clear and with good recall at PT eval        General Comments General comments (skin integrity, edema, etc.): orthostatics negative    Exercises     Assessment/Plan    PT Assessment Patient needs continued PT services  PT Problem List Decreased strength;Decreased balance;Decreased mobility       PT Treatment Interventions Balance training;Gait training;Stair training;Functional mobility training;Patient/family education;Therapeutic activities;Therapeutic exercise    PT Goals (Current goals can be found in the Care Plan section)  Acute Rehab PT Goals Patient Stated Goal: go home soon PT Goal Formulation: With patient Time For Goal Achievement: 07/11/21 Potential to Achieve Goals: Good    Frequency Min 3X/week   Barriers to discharge        Co-evaluation               AM-PAC PT "6 Clicks" Mobility  Outcome Measure Help needed turning from your back to your side while in a flat bed without using bedrails?: None Help needed moving from lying on your back to sitting on the side of a flat bed without using bedrails?: A Little Help needed moving to and from a bed to a chair (including a wheelchair)?: None Help needed standing up from a chair using your arms (e.g., wheelchair or bedside chair)?: None Help needed to walk in hospital room?: None Help needed climbing  3-5 steps with a railing? : A Little 6 Click Score: 22    End of Session Equipment Utilized During Treatment: Gait belt Activity Tolerance: Patient tolerated treatment well Patient left: in chair;with call bell/phone within reach Nurse Communication: Mobility status;Other (comment) (orthostatics) PT Visit Diagnosis: Muscle weakness (generalized) (M62.81);History of falling (Z91.81)    Time: 8921-1941 PT Time Calculation (min) (ACUTE ONLY): 38 min   Charges:   PT Evaluation $PT Eval Low Complexity: 1 Low PT Treatments $Gait Training: 8-22 mins $Therapeutic Activity: 8-22 mins       Windell Norfolk, DPT, PN2   [facility]    Pager [phone] Acute Rehab Office [phone]

## 2021-06-27 NOTE — Discharge Summary (Signed)
Physician Discharge Summary  Caitlin Irwin VZC:[id number] DOB: [birth year] DOA: 06/26/2021  PCP: System, Provider Not In  Admit date: 06/26/2021 Discharge date: 06/27/2021  Time spent: 60 minutes  Recommendations for Outpatient Follow-up:  Follow-up PCP in 2 weeks  Discharge Diagnoses:  Principal Problem:   Bilateral leg weakness Active Problems:   Hypothyroidism   Irritable bowel syndrome   Hx of migraine headaches   Depression   Trigeminal neuralgia of left side of face   Dizziness   Discharge Condition: Stable  Diet recommendation: Regular diet  Filed Weights   06/26/21 0851 06/26/21 2156  Weight: 49.9 kg 54.7 kg    History of present illness:  70 year old female with medical history of left trigeminal neuralgia, migraine, anxiety, depression, hypothyroidism came to ED with bilateral leg weakness.  She has a history of back surgery, anterior lumbar fusion L4-L5, L5-S1.  Denies previous history of neurological disease. Patient's symptoms had improved by the time she was admitted.  Patient was mated for PT evaluation.  Hospital Course:  Bilateral leg weakness -Patient presents with 3-day history of worsening bilateral lower extremity weakness -Neurology was consulted, MRI brain was unremarkable, MRI lumbar spine showed chronic changes, no acute findings. -Neurology reviewed the films, no need condition. -PT OT saw the patient and recommended PT follow-up -Vitamin B12 was on the lower limit of normal at 271 -Patient given 1 dose of vitamin B12 1000 mcg subcu x1 -We will discharge on vitamin B12 1000 mcg daily -We will need to follow-up with PCP to recheck B12 level as outpatient in 3 to 4 weeks  Dizziness -Unclear etiology,?  BPPV -Patient admitted with dizziness -MRI brain was negative -PT saw the patient, dizziness had resolved.  Orthostatic vital signs were negative. -We will discharge patient home on meclizine 25 mg 3 times daily as needed  ?  Memory  loss -Patient is supposed to follow-up with neurology as outpatient  Hypothyroidism -Continue Synthroid -Recent TSH within normal limit on 10/22  History of migraine headaches -Continue Topamax, sumatriptan as needed  Depression/anxiety -Continue Prozac  Trigeminal neuralgia of left side of the face -S/p surgery, continue Topamax  Procedures:   Consultations:   Discharge Exam: Vitals:   06/27/21 0356 06/27/21 1217  BP: 120/65 (!) 121/55  Pulse: 67 79  Resp: 18 18  Temp: 97.8 F (36.6 C) 98 F (36.7 C)  SpO2: 97%     General: Appears in no acute distress Cardiovascular: S1-S2, regular, no murmur auscultated Respiratory: Clear to auscultation bilaterally  Discharge Instructions   Discharge Instructions     Diet - low sodium heart healthy   Complete by: As directed    Increase activity slowly   Complete by: As directed       Allergies as of 06/27/2021       Reactions   Acetaminophen Nausea And Vomiting        Medication List     TAKE these medications    ALPRAZolam 0.5 MG tablet Commonly known as: XANAX Take 0.5 mg by mouth at bedtime as needed for anxiety.   estradiol 1 MG tablet Commonly known as: ESTRACE Take 1 mg by mouth daily.   FLUoxetine 40 MG capsule Commonly known as: PROZAC Take 40 mg by mouth daily.   gabapentin 100 MG capsule Commonly known as: NEURONTIN Take 100 mg by mouth 3 (three) times daily.   gabapentin 300 MG capsule Commonly known as: Neurontin Take 1 capsule (300 mg total) by mouth every morning.   levothyroxine 75  MCG tablet Commonly known as: SYNTHROID Take 75 mcg by mouth daily before breakfast.   meclizine 25 MG tablet Commonly known as: ANTIVERT Take 1 tablet (25 mg total) by mouth 3 (three) times daily as needed for dizziness.   multivitamin with minerals Tabs tablet Take 1 tablet by mouth daily.   SUMAtriptan 100 MG tablet Commonly known as: Imitrex Take 1 tablet (100 mg total) by mouth every 2  (two) hours as needed for migraine. May repeat in 2 hours if headache persists or recurs.   topiramate 50 MG tablet Commonly known as: TOPAMAX Take 50 mg by mouth 2 (two) times daily.   vitamin B-12 1000 MCG tablet Commonly known as: CYANOCOBALAMIN Take 1 tablet (1,000 mcg total) by mouth daily.       Allergies  Allergen Reactions   Acetaminophen Nausea And Vomiting      The results of significant diagnostics from this hospitalization (including imaging, microbiology, ancillary and laboratory) are listed below for reference.    Significant Diagnostic Studies: DG Chest 2 View  Result Date: 06/26/2021 CLINICAL DATA:  C/o dizziness and generalized weakness x 3 days. Hx of vertigo but doesn't take any meds for it. Several falls over the past few days- denies injury. (Per er triage note) EXAM: CHEST - 2 VIEW COMPARISON:  05/10/2021 FINDINGS: Cardiac silhouette is normal in size. The SD occlusion device is stable. No mediastinal or hilar masses or evidence of adenopathy. Lungs are clear.  No pleural effusion or pneumothorax. Skeletal structures are demineralized. Previous resection of the distal right clavicle. No acute osseous abnormality. IMPRESSION: No acute cardiopulmonary disease. Electronically Signed   By: Lajean Manes M.D.   On: 06/26/2021 09:42   MR BRAIN WO CONTRAST  Result Date: 06/26/2021 CLINICAL DATA:  Neuro deficit, acute, stroke suspected. EXAM: MRI HEAD WITHOUT CONTRAST TECHNIQUE: Multiplanar, multiecho pulse sequences of the brain and surrounding structures were obtained without intravenous contrast. COMPARISON:  Brain MRI 12/22/2020.  Head CT 12/22/2020. FINDINGS: Brain: The patient was unable to tolerate the full examination. As a result, the routine axial T1 weighted and coronal T2 weighted sequences could not be obtained. There is intermittent motion degradation of the acquired sequences. Most notably, there is mild motion degradation of the sagittal T1 weighted  sequence, mild motion degradation of the axial SWI sequence and mild motion degradation of the axial T2 FLAIR sequence. Mild generalized cerebral and cerebellar atrophy. Stable postoperative changes from previous left suboccipital craniotomy for trigeminal nerve decompression. No cortical encephalomalacia is identified. No significant cerebral white matter disease for age. There is no acute infarct. No evidence of an intracranial mass. No extra-axial fluid collection. No midline shift. Vascular: Maintained flow voids within the proximal large arterial vessels. Skull and upper cervical spine: No focal suspicious marrow lesion. Sinuses/Orbits: Visualized orbits show no acute finding. Prior left lens replacement. No significant paranasal sinus disease. IMPRESSION: Prematurely terminated and mildly motion degraded examination, as described and limiting evaluation. No evidence of acute intracranial abnormality. Redemonstrated postoperative changes from prior left suboccipital craniotomy for trigeminal nerve treatment. Mild generalized parenchymal atrophy, stable from the prior brain MRI of 12/22/2020. Otherwise unremarkable non-contrast MRI appearance of the brain for age. Electronically Signed   By: Kellie Simmering D.O.   On: 06/26/2021 10:57   MR LUMBAR SPINE WO CONTRAST  Result Date: 06/26/2021 CLINICAL DATA:  Motor neuron disease. EXAM: MRI LUMBAR SPINE WITHOUT CONTRAST TECHNIQUE: Multiplanar, multisequence MR imaging of the lumbar spine was performed. No intravenous contrast was administered. COMPARISON:  Lumbar spine radiographs 04/27/2021. Abdominopelvic CT 01/10/2019. FINDINGS: Segmentation: Conventional anatomy assumed, with the last open disc space designated L5-S1.Concordant with previous imaging. Alignment: There is a convex left scoliosis centered at L3-4. Chronic fused anterolisthesis at L4-5 and L5-S1. Vertebrae: No worrisome osseous lesion, acute fracture or pars defect. Status post anterior lumbar and  interbody fusion at L4-5 and L5-S1. The hardware appears grossly unchanged. The visualized sacroiliac joints appear unremarkable. Conus medullaris: Extends to the L1 level and appears normal. Paraspinal and other soft tissues: No significant paraspinal findings. The urinary bladder is moderately distended. Chronic extrahepatic biliary dilatation, similar to previous CT. Disc levels: Sagittal imaging of the lower thoracic spine demonstrates mild disc degeneration without resulting significant spinal stenosis or nerve root encroachment. L1-2: Mild disc bulging. No significant spinal stenosis or nerve root encroachment. L2-3: Mild disc bulging. There is a small left paracentral disc extrusion with caudal migration behind the L3 vertebral body. No resulting nerve root encroachment. The foramina are patent. L3-4: Mild loss of disc height with annular disc bulging and moderate facet and ligamentous hypertrophy. Resulting moderate multifactorial spinal stenosis with mild asymmetric narrowing of the right lateral recess. The foramina appear sufficiently patent. L4-5: Status post anterior lumbar and interbody fusion. Chronic anterolisthesis, posterior osteophytes and bilateral facet hypertrophy contribute to residual chronic spinal stenosis which appears grossly unchanged. Both foramina are mildly narrowed. L5-S1: Status post anterior lumbar and interbody fusion. Previous right laminectomy. Mild bilateral facet hypertrophy. No significant spinal stenosis or nerve root encroachment. IMPRESSION: 1. No acute findings identified. 2. Status post anterior lumbar and interbody fusion at L4-5 and L5-S1, without apparent change from previous imaging. Chronic posterior osteophytes, grade 1 anterolisthesis and facet hypertrophy at L4-5 contribute to chronic residual moderate spinal stenosis and foraminal narrowing bilaterally. 3. Mild adjacent segment disease at L3-4 with disc bulging, facet and ligamentous hypertrophy contributing to  moderate multifactorial spinal stenosis. 4. Small left paracentral disc extrusion with caudal migration at L2-3, not causing spinal stenosis or nerve root encroachment. 5. Distended bladder. Chronic biliary dilatation, similar to previous CT. Electronically Signed   By: Richardean Sale M.D.   On: 06/26/2021 15:44    Microbiology: Recent Results (from the past 240 hour(s))  Resp Panel by RT-PCR (Flu A&B, Covid) Nasopharyngeal Swab     Status: None   Collection Time: 06/26/21  9:06 AM   Specimen: Nasopharyngeal Swab; Nasopharyngeal(NP) swabs in vial transport medium  Result Value Ref Range Status   SARS Coronavirus 2 by RT PCR NEGATIVE NEGATIVE Final    Comment: (NOTE) SARS-CoV-2 target nucleic acids are NOT DETECTED.  The SARS-CoV-2 RNA is generally detectable in upper respiratory specimens during the acute phase of infection. The lowest concentration of SARS-CoV-2 viral copies this assay can detect is 138 copies/mL. A negative result does not preclude SARS-Cov-2 infection and should not be used as the sole basis for treatment or other patient management decisions. A negative result may occur with  improper specimen collection/handling, submission of specimen other than nasopharyngeal swab, presence of viral mutation(s) within the areas targeted by this assay, and inadequate number of viral copies(<138 copies/mL). A negative result must be combined with clinical observations, patient history, and epidemiological information. The expected result is Negative.  Fact Sheet for Patients:  EntrepreneurPulse.com.au  Fact Sheet for Healthcare Providers:  IncredibleEmployment.be  This test is no t yet approved or cleared by the [place] FDA and  has been authorized for detection and/or diagnosis of SARS-CoV-2 by FDA under an Emergency Use Authorization (  EUA). This EUA will remain  in effect (meaning this test can be used) for the duration of  the COVID-19 declaration under Section 564(b)(1) of the Act, 21 U.S.C.section 360bbb-3(b)(1), unless the authorization is terminated  or revoked sooner.       Influenza A by PCR NEGATIVE NEGATIVE Final   Influenza B by PCR NEGATIVE NEGATIVE Final    Comment: (NOTE) The Xpert Xpress SARS-CoV-2/FLU/RSV plus assay is intended as an aid in the diagnosis of influenza from Nasopharyngeal swab specimens and should not be used as a sole basis for treatment. Nasal washings and aspirates are unacceptable for Xpert Xpress SARS-CoV-2/FLU/RSV testing.  Fact Sheet for Patients: EntrepreneurPulse.com.au  Fact Sheet for Healthcare Providers: IncredibleEmployment.be  This test is not yet approved or cleared by the [place] FDA and has been authorized for detection and/or diagnosis of SARS-CoV-2 by FDA under an Emergency Use Authorization (EUA). This EUA will remain in effect (meaning this test can be used) for the duration of the COVID-19 declaration under Section 564(b)(1) of the Act, 21 U.S.C. section 360bbb-3(b)(1), unless the authorization is terminated or revoked.  Performed at [facility], [place] [address], [place], [place] [address]      Labs: Basic Metabolic Panel: Recent Labs  Lab 06/26/21 0832 06/27/21 0151  NA 140 140  K 3.8 3.7  CL 107 111  CO2 27 25  GLUCOSE 72 110*  BUN 17 13  CREATININE 0.94 0.88  CALCIUM 10.3 9.4   Liver Function Tests: Recent Labs  Lab 06/26/21 0832  AST 26  ALT 33  ALKPHOS 92  BILITOT 0.4  PROT 6.2*  ALBUMIN 3.7   Recent Labs  Lab 06/26/21 0832  LIPASE 38   No results for input(s): AMMONIA in the last 168 hours. CBC: Recent Labs  Lab 06/26/21 0832 06/27/21 0151  WBC 7.3 6.1  HGB 12.9 11.7*  HCT 40.2 36.4  MCV 93.3 92.9  PLT 308 263   Cardiac Enzymes: No results for input(s): CKTOTAL, CKMB, CKMBINDEX, TROPONINI in the last 168 hours. BNP: BNP (last 3 results) No results for  input(s): BNP in the last 8760 hours.  ProBNP (last 3 results) No results for input(s): PROBNP in the last 8760 hours.  CBG: Recent Labs  Lab 06/26/21 0826  GLUCAP 87       Signed:  Oswald Hillock MD.  [facility] 06/27/2021, 2:59 PM

## 2021-06-28 ENCOUNTER — Inpatient Hospital Stay (HOSPITAL_COMMUNITY)
Admission: RE | Admit: 2021-06-28 | Discharge: 2021-06-28 | Disposition: A | Discharge status: Home / Self Care | Payer: PPO | Source: Ambulatory Visit | Attending: Internal Medicine | Admitting: Internal Medicine

## 2021-06-28 ENCOUNTER — Encounter (HOSPITAL_COMMUNITY): Payer: Self-pay

## 2021-08-24 DIAGNOSIS — R7989 Other specified abnormal findings of blood chemistry: Secondary | ICD-10-CM | POA: Diagnosis not present

## 2021-08-24 DIAGNOSIS — E039 Hypothyroidism, unspecified: Secondary | ICD-10-CM | POA: Diagnosis not present

## 2021-08-24 DIAGNOSIS — F419 Anxiety disorder, unspecified: Secondary | ICD-10-CM | POA: Diagnosis not present

## 2021-08-24 DIAGNOSIS — M810 Age-related osteoporosis without current pathological fracture: Secondary | ICD-10-CM | POA: Diagnosis not present

## 2021-08-24 DIAGNOSIS — R413 Other amnesia: Secondary | ICD-10-CM | POA: Diagnosis not present

## 2021-08-26 ENCOUNTER — Ambulatory Visit: Payer: PPO | Admitting: Neurology

## 2021-10-29 ENCOUNTER — Other Ambulatory Visit: Payer: Self-pay | Admitting: Internal Medicine

## 2021-10-29 DIAGNOSIS — M810 Age-related osteoporosis without current pathological fracture: Secondary | ICD-10-CM

## 2021-10-29 DIAGNOSIS — Z780 Asymptomatic menopausal state: Secondary | ICD-10-CM

## 2021-11-04 ENCOUNTER — Ambulatory Visit: Payer: PPO

## 2021-11-04 ENCOUNTER — Other Ambulatory Visit: Payer: PPO

## 2022-03-25 ENCOUNTER — Emergency Department (HOSPITAL_COMMUNITY): Payer: PPO

## 2022-03-25 ENCOUNTER — Encounter (HOSPITAL_COMMUNITY): Payer: Self-pay

## 2022-03-25 ENCOUNTER — Emergency Department (HOSPITAL_COMMUNITY)
Admission: EM | Admit: 2022-03-25 | Discharge: 2022-03-25 | Disposition: A | Discharge status: Home / Self Care | Payer: PPO | Attending: Emergency Medicine | Admitting: Emergency Medicine

## 2022-03-25 DIAGNOSIS — Y92009 Unspecified place in unspecified non-institutional (private) residence as the place of occurrence of the external cause: Secondary | ICD-10-CM | POA: Insufficient documentation

## 2022-03-25 DIAGNOSIS — R531 Weakness: Secondary | ICD-10-CM | POA: Insufficient documentation

## 2022-03-25 DIAGNOSIS — I959 Hypotension, unspecified: Secondary | ICD-10-CM | POA: Diagnosis not present

## 2022-03-25 DIAGNOSIS — M47812 Spondylosis without myelopathy or radiculopathy, cervical region: Secondary | ICD-10-CM | POA: Insufficient documentation

## 2022-03-25 DIAGNOSIS — I10 Essential (primary) hypertension: Secondary | ICD-10-CM | POA: Diagnosis not present

## 2022-03-25 DIAGNOSIS — Z043 Encounter for examination and observation following other accident: Secondary | ICD-10-CM | POA: Diagnosis not present

## 2022-03-25 DIAGNOSIS — Z981 Arthrodesis status: Secondary | ICD-10-CM | POA: Diagnosis not present

## 2022-03-25 DIAGNOSIS — R519 Headache, unspecified: Secondary | ICD-10-CM | POA: Diagnosis not present

## 2022-03-25 DIAGNOSIS — M4316 Spondylolisthesis, lumbar region: Secondary | ICD-10-CM | POA: Diagnosis not present

## 2022-03-25 DIAGNOSIS — R9431 Abnormal electrocardiogram [ECG] [EKG]: Secondary | ICD-10-CM | POA: Diagnosis not present

## 2022-03-25 DIAGNOSIS — W19XXXA Unspecified fall, initial encounter: Secondary | ICD-10-CM

## 2022-03-25 DIAGNOSIS — S199XXA Unspecified injury of neck, initial encounter: Secondary | ICD-10-CM | POA: Diagnosis not present

## 2022-03-25 DIAGNOSIS — W01198A Fall on same level from slipping, tripping and stumbling with subsequent striking against other object, initial encounter: Secondary | ICD-10-CM | POA: Diagnosis not present

## 2022-03-25 DIAGNOSIS — S0990XA Unspecified injury of head, initial encounter: Secondary | ICD-10-CM

## 2022-03-25 DIAGNOSIS — Z9889 Other specified postprocedural states: Secondary | ICD-10-CM | POA: Diagnosis not present

## 2022-03-25 LAB — COMPREHENSIVE METABOLIC PANEL
ALT: 18 U/L (ref 0–44)
AST: 21 U/L (ref 15–41)
Albumin: 3.4 g/dL — ABNORMAL LOW (ref 3.5–5.0)
Alkaline Phosphatase: 97 U/L (ref 38–126)
Anion gap: 8 (ref 5–15)
BUN: 8 mg/dL (ref 8–23)
CO2: 23 mmol/L (ref 22–32)
Calcium: 10.3 mg/dL (ref 8.9–10.3)
Chloride: 110 mmol/L (ref 98–111)
Creatinine, Ser: 1.04 mg/dL — ABNORMAL HIGH (ref 0.44–1.00)
GFR, Estimated: 57 mL/min — ABNORMAL LOW (ref >60)
Glucose, Bld: 96 mg/dL (ref 70–99)
Potassium: 3.5 mmol/L (ref 3.5–5.1)
Sodium: 141 mmol/L (ref 135–145)
Total Bilirubin: 0.8 mg/dL (ref 0.3–1.2)
Total Protein: 6 g/dL — ABNORMAL LOW (ref 6.5–8.1)

## 2022-03-25 LAB — CBC WITH DIFFERENTIAL/PLATELET
Abs Immature Granulocytes: 0.02 10*3/uL (ref 0.00–0.07)
Basophils Absolute: 0 10*3/uL (ref 0.0–0.1)
Basophils Relative: 1 %
Eosinophils Absolute: 0.1 10*3/uL (ref 0.0–0.5)
Eosinophils Relative: 2 %
HCT: 37.6 % (ref 36.0–46.0)
Hemoglobin: 12.6 g/dL (ref 12.0–15.0)
Immature Granulocytes: 0 %
Lymphocytes Relative: 29 %
Lymphs Abs: 1.7 10*3/uL (ref 0.7–4.0)
MCH: 29.7 pg (ref 26.0–34.0)
MCHC: 33.5 g/dL (ref 30.0–36.0)
MCV: 88.7 fL (ref 80.0–100.0)
Monocytes Absolute: 0.5 10*3/uL (ref 0.1–1.0)
Monocytes Relative: 9 %
Neutro Abs: 3.6 10*3/uL (ref 1.7–7.7)
Neutrophils Relative %: 59 %
Platelets: 252 10*3/uL (ref 150–400)
RBC: 4.24 MIL/uL (ref 3.87–5.11)
RDW: 12.5 % (ref 11.5–15.5)
WBC: 5.9 10*3/uL (ref 4.0–10.5)
nRBC: 0 % (ref 0.0–0.2)

## 2022-03-25 LAB — CK: Total CK: 43 U/L (ref 38–234)

## 2022-03-25 LAB — MAGNESIUM: Magnesium: 1.8 mg/dL (ref 1.7–2.4)

## 2022-03-25 LAB — ETHANOL: Alcohol, Ethyl (B): <10 mg/dL (ref <10)

## 2022-03-25 MED ORDER — SODIUM CHLORIDE 0.9 % IV BOLUS
1000.0000 mL | Freq: Once | INTRAVENOUS | Status: AC
  Administered 2022-03-25: 1000 mL via INTRAVENOUS

## 2022-03-25 NOTE — Discharge Instructions (Signed)
You develop new or worsening headache, weakness, vomiting, or any other new/concerning symptoms and return to the ER for evaluation.

## 2022-03-25 NOTE — ED Provider Notes (Signed)
[facility] EMERGENCY DEPARTMENT Provider Note   CSN: [id number] Arrival date & time: 03/25/22  1824     History  Chief Complaint  Patient presents with   Caitlin Irwin    Caitlin Irwin is a 71 y.o. female.  HPI 71 year old female presents with headache and fall.  She states that around 8 AM she woke up on the floor and had a headache.  She does not remember falling.  She states that she just woke up on the floor.  She was so weak in all 4 extremities she had to crawl around.  She states she called 911 and was brought here.  However she did not realize it was 7 PM when I was examining her.  She states the headache is still present and is bilateral.  Its not as bad as her typical migraines but also feels different than typical migraines.  No neck pain though she was put in a collar.  Weakness seems a lot better.  No chest pain, shortness of breath, or recent illness.  Home Medications Prior to Admission medications   Medication Sig Start Date End Date Taking? Authorizing Provider  ALPRAZolam Duanne Moron) 0.5 MG tablet Take 0.5 mg by mouth at bedtime as needed for anxiety.    [provider]  estradiol (ESTRACE) 1 MG tablet Take 1 mg by mouth daily.  04/21/13   [provider]  FLUoxetine (PROZAC) 40 MG capsule Take 40 mg by mouth daily.    [provider]  gabapentin (NEURONTIN) 100 MG capsule Take 100 mg by mouth 3 (three) times daily.    [provider]  gabapentin (NEURONTIN) 300 MG capsule Take 1 capsule (300 mg total) by mouth every morning. Patient not taking: Reported on 06/26/2021 06/09/19   Tegeler, Gwenyth Allegra, MD  levothyroxine (SYNTHROID, LEVOTHROID) 75 MCG tablet Take 75 mcg by mouth daily before breakfast.  04/09/17   [provider]  meclizine (ANTIVERT) 25 MG tablet Take 1 tablet (25 mg total) by mouth 3 (three) times daily as needed for dizziness. 06/27/21   Oswald Hillock, MD  Multiple Vitamin (MULTIVITAMIN WITH  MINERALS) TABS tablet Take 1 tablet by mouth daily.    [provider]  SUMAtriptan (IMITREX) 100 MG tablet Take 1 tablet (100 mg total) by mouth every 2 (two) hours as needed for migraine. May repeat in 2 hours if headache persists or recurs. 06/09/19   Tegeler, Gwenyth Allegra, MD  topiramate (TOPAMAX) 50 MG tablet Take 50 mg by mouth 2 (two) times daily.    [provider]  vitamin B-12 (CYANOCOBALAMIN) 1000 MCG tablet Take 1 tablet (1,000 mcg total) by mouth daily. 06/27/21   Oswald Hillock, MD  eletriptan (RELPAX) 40 MG tablet One tablet by mouth as needed for migraine headache.  If the headache improves and then returns, dose may be repeated after 2 hours have elapsed since first dose (do not exceed 80 mg per day). may repeat in 2 hours if necessary  10/20/11  [provider]  ferrous sulfate 325 (65 FE) MG tablet Take 325 mg by mouth daily with breakfast.  10/20/11  [provider]      Allergies    Acetaminophen    Review of Systems   Review of Systems  Constitutional:  Negative for fever.  Eyes:  Negative for visual disturbance.  Respiratory:  Negative for shortness of breath.   Cardiovascular:  Negative for chest pain.  Gastrointestinal:  Negative for abdominal pain and vomiting.  Musculoskeletal:  Positive for back pain. Negative for neck pain.  Neurological:  Positive for weakness and headaches.    Physical Exam Updated Vital Signs BP (!) 140/80   Pulse 72   Temp 98.6 F (37 C)   Resp 17   Ht '4\' 10"'$  (1.473 m)   Wt 49.9 kg   SpO2 98%   BMI 22.99 kg/m  Physical Exam Vitals and nursing note reviewed.  Constitutional:      Appearance: She is well-developed.     Interventions: Cervical collar in place.  HENT:     Head: Normocephalic and atraumatic.     Comments: No scalp tenderness Eyes:     Extraocular Movements: Extraocular movements intact.     Pupils: Pupils are equal, round, and reactive to light.  Cardiovascular:     Rate and  Rhythm: Normal rate and regular rhythm.     Heart sounds: Normal heart sounds.  Pulmonary:     Effort: Pulmonary effort is normal.     Breath sounds: Normal breath sounds.  Abdominal:     General: There is no distension.     Palpations: Abdomen is soft.     Tenderness: There is no abdominal tenderness.  Musculoskeletal:     Cervical back: No tenderness. No spinous process tenderness or muscular tenderness.     Thoracic back: No tenderness.     Lumbar back: Tenderness present.  Skin:    General: Skin is warm and dry.  Neurological:     Mental Status: She is alert and oriented to person, place, and time.     Comments: CN 3-12 grossly intact. 5/5 strength in all 4 extremities. Grossly normal sensation. Normal finger to nose.      ED Results / Procedures / Treatments   Labs (all labs ordered are listed, but only abnormal results are displayed) Labs Reviewed  COMPREHENSIVE METABOLIC PANEL - Abnormal; Notable for the following components:      Result Value   Creatinine, Ser 1.04 (*)    Total Protein 6.0 (*)    Albumin 3.4 (*)    GFR, Estimated 57 (*)    All other components within normal limits  CBC WITH DIFFERENTIAL/PLATELET  ETHANOL  MAGNESIUM  CK  URINALYSIS, ROUTINE W REFLEX MICROSCOPIC  RAPID URINE DRUG SCREEN, HOSP PERFORMED  CBG MONITORING, ED    EKG EKG Interpretation  Date/Time:  Friday March 25 2022 19:17:11 EDT Ventricular Rate:  64 PR Interval:  79 QRS Duration: 105 QT Interval:  435 QTC Calculation: 449 R Axis:   -35 Text Interpretation: Sinus rhythm Short PR interval Abnormal R-wave progression, late transition Left ventricular hypertrophy similar to Nov 2022 Confirmed by Sherwood Gambler [phone]) on 03/25/2022 9:19:37 PM  Radiology CT Head Wo Contrast  Result Date: 03/25/2022 CLINICAL DATA:  Head trauma, minor (Age >= 65y); Neck trauma (Age >= 65y). Fall, head injury EXAM: CT HEAD WITHOUT CONTRAST CT CERVICAL SPINE WITHOUT CONTRAST TECHNIQUE:  Multidetector CT imaging of the head and cervical spine was performed following the standard protocol without intravenous contrast. Multiplanar CT image reconstructions of the cervical spine were also generated. RADIATION DOSE REDUCTION: This exam was performed according to the departmental dose-optimization program which includes automated exposure control, adjustment of the mA and/or kV according to patient size and/or use of iterative reconstruction technique. COMPARISON:  12/22/2020 FINDINGS: CT HEAD FINDINGS Brain: Left suboccipital craniotomy is again noted with stable high density material seen subjacent to the cranioplasty component. No acute intracranial hemorrhage or infarct. No abnormal mass  effect or midline shift. No other abnormal intra or extra-axial mass lesion or fluid collection. Ventricular size is normal. Cerebellum is unremarkable. Vascular: No hyperdense vessel or unexpected calcification. Skull: No acute fracture Sinuses/Orbits: No acute finding. Other: Mastoid air cells and middle ear cavities are clear. CT CERVICAL SPINE FINDINGS Alignment: Normal. Skull base and vertebrae: Craniocervical alignment is normal. Landau dental interval is not widened. No acute fracture of the cervical spine. Vertebral body height is preserved. Soft tissues and spinal canal: No prevertebral fluid or swelling. No visible canal hematoma. Disc levels: There is intervertebral disc space narrowing and endplate remodeling throughout the cervical spine in keeping with changes of advanced degenerative disc disease. Prevertebral soft tissues are not thickened. Spinal canal is widely patent. Multilevel uncovertebral and facet arthrosis result in multilevel neuroforaminal narrowing, most severe on the right at C3-4. Upper chest: Negative. Other: None IMPRESSION: 1. No acute intracranial abnormality. No calvarial fracture. 2. Stable left suboccipital craniotomy. Unchanged high density material subjacent to the cranioplasty  prosthesis. 3. No acute fracture or listhesis of the cervical spine. Electronically Signed   By: Fidela Salisbury M.D.   On: 03/25/2022 20:28   CT Cervical Spine Wo Contrast  Result Date: 03/25/2022 CLINICAL DATA:  Head trauma, minor (Age >= 65y); Neck trauma (Age >= 65y). Fall, head injury EXAM: CT HEAD WITHOUT CONTRAST CT CERVICAL SPINE WITHOUT CONTRAST TECHNIQUE: Multidetector CT imaging of the head and cervical spine was performed following the standard protocol without intravenous contrast. Multiplanar CT image reconstructions of the cervical spine were also generated. RADIATION DOSE REDUCTION: This exam was performed according to the departmental dose-optimization program which includes automated exposure control, adjustment of the mA and/or kV according to patient size and/or use of iterative reconstruction technique. COMPARISON:  12/22/2020 FINDINGS: CT HEAD FINDINGS Brain: Left suboccipital craniotomy is again noted with stable high density material seen subjacent to the cranioplasty component. No acute intracranial hemorrhage or infarct. No abnormal mass effect or midline shift. No other abnormal intra or extra-axial mass lesion or fluid collection. Ventricular size is normal. Cerebellum is unremarkable. Vascular: No hyperdense vessel or unexpected calcification. Skull: No acute fracture Sinuses/Orbits: No acute finding. Other: Mastoid air cells and middle ear cavities are clear. CT CERVICAL SPINE FINDINGS Alignment: Normal. Skull base and vertebrae: Craniocervical alignment is normal. Landau dental interval is not widened. No acute fracture of the cervical spine. Vertebral body height is preserved. Soft tissues and spinal canal: No prevertebral fluid or swelling. No visible canal hematoma. Disc levels: There is intervertebral disc space narrowing and endplate remodeling throughout the cervical spine in keeping with changes of advanced degenerative disc disease. Prevertebral soft tissues are not  thickened. Spinal canal is widely patent. Multilevel uncovertebral and facet arthrosis result in multilevel neuroforaminal narrowing, most severe on the right at C3-4. Upper chest: Negative. Other: None IMPRESSION: 1. No acute intracranial abnormality. No calvarial fracture. 2. Stable left suboccipital craniotomy. Unchanged high density material subjacent to the cranioplasty prosthesis. 3. No acute fracture or listhesis of the cervical spine. Electronically Signed   By: Fidela Salisbury M.D.   On: 03/25/2022 20:28   DG Lumbar Spine Complete  Result Date: 03/25/2022 CLINICAL DATA:  Fall. EXAM: LUMBAR SPINE - COMPLETE 4+ VIEW COMPARISON:  Lumbar spine x-ray 04/27/2021 FINDINGS: Anterior fusion hardware seen at L4-L5 and L5-S1 similar to the prior study. There is stable 5 mm of anterolisthesis at L4-L5. No acute fractures are seen. Soft tissues are within normal limits. IMPRESSION: 1. No acute fracture or dislocation.  2. Stable postsurgical changes at L4-L5 and L5-S1. Electronically Signed   By: Ronney Asters M.D.   On: 03/25/2022 19:46    Procedures Procedures    Medications Ordered in ED Medications  sodium chloride 0.9 % bolus 1,000 mL (0 mLs Intravenous Stopped 03/25/22 2210)    ED Course/ Medical Decision Making/ A&P                           Medical Decision Making Amount and/or Complexity of Data Reviewed Labs: ordered.    Details: White blood cell count and hemoglobin normal.  Creatinine slightly bumped from normal but no AKI.  Electrolytes okay.  CK normal Radiology: ordered and independent interpretation performed.    Details: CT head and C-spine without fracture or head bleed.  No fracture of L-spine. ECG/medicine tests: ordered and independent interpretation performed.    Details: No arrhythmia or ST changes   At first, patient seem to indicate she was not sure how she fell.  However on reevaluation she tells me that she remembers sitting on the edge of her bed and losing her  balance and falling.  She has had a headache afterwards but never had a preceding headache.  Thus my suspicion of something such as subarachnoid hemorrhage/aneurysmal bleeding is quite low.  She is not altered.  Work-up is unremarkable here.  She denies any urinary symptoms and does not want to wait on a urinalysis.  She otherwise tells me that she laid in bed and tried to text friends to come get her but no could get her so she called 911.  At this point I think she is stable for discharge home.  She has been up and able to ambulate.  Given return precautions.        Final Clinical Impression(s) / ED Diagnoses Final diagnoses:  Fall, initial encounter  Minor head injury, initial encounter    Rx / DC Orders ED Discharge Orders     None         Sherwood Gambler, MD 03/25/22 2247

## 2022-03-25 NOTE — ED Triage Notes (Signed)
Pt BIB [facility] for eval s/p mechanical fall backwards at home at 0800 and struck her head. No thinners. Pt reports she wanted to wait today and see if she felt better. States she wants to be eval'd for concussion, no obvious trauma

## 2022-03-25 NOTE — ED Notes (Signed)
Pt is slow but steady on feet while ambulated

## 2022-03-25 NOTE — ED Notes (Signed)
Patient transported to X-ray 

## 2022-05-01 DIAGNOSIS — S8290XA Unspecified fracture of unspecified lower leg, initial encounter for closed fracture: Secondary | ICD-10-CM

## 2022-05-01 HISTORY — DX: Unspecified fracture of unspecified lower leg, initial encounter for closed fracture: S82.90XA

## 2022-05-06 DIAGNOSIS — Z1339 Encounter for screening examination for other mental health and behavioral disorders: Secondary | ICD-10-CM | POA: Diagnosis not present

## 2022-05-06 DIAGNOSIS — F419 Anxiety disorder, unspecified: Secondary | ICD-10-CM | POA: Diagnosis not present

## 2022-05-06 DIAGNOSIS — Z23 Encounter for immunization: Secondary | ICD-10-CM | POA: Diagnosis not present

## 2022-05-06 DIAGNOSIS — M810 Age-related osteoporosis without current pathological fracture: Secondary | ICD-10-CM | POA: Diagnosis not present

## 2022-05-06 DIAGNOSIS — I10 Essential (primary) hypertension: Secondary | ICD-10-CM | POA: Diagnosis not present

## 2022-05-06 DIAGNOSIS — G43009 Migraine without aura, not intractable, without status migrainosus: Secondary | ICD-10-CM | POA: Diagnosis not present

## 2022-05-06 DIAGNOSIS — E039 Hypothyroidism, unspecified: Secondary | ICD-10-CM | POA: Diagnosis not present

## 2022-05-06 DIAGNOSIS — R413 Other amnesia: Secondary | ICD-10-CM | POA: Diagnosis not present

## 2022-05-06 DIAGNOSIS — G500 Trigeminal neuralgia: Secondary | ICD-10-CM | POA: Diagnosis not present

## 2022-05-06 DIAGNOSIS — F339 Major depressive disorder, recurrent, unspecified: Secondary | ICD-10-CM | POA: Diagnosis not present

## 2022-05-06 DIAGNOSIS — Z0000 Encounter for general adult medical examination without abnormal findings: Secondary | ICD-10-CM | POA: Diagnosis not present

## 2022-05-06 DIAGNOSIS — Z1331 Encounter for screening for depression: Secondary | ICD-10-CM | POA: Diagnosis not present

## 2022-05-21 ENCOUNTER — Encounter (HOSPITAL_COMMUNITY): Payer: Self-pay | Admitting: Emergency Medicine

## 2022-05-21 ENCOUNTER — Emergency Department (HOSPITAL_COMMUNITY): Payer: PPO

## 2022-05-21 ENCOUNTER — Observation Stay (HOSPITAL_COMMUNITY): Payer: PPO

## 2022-05-21 ENCOUNTER — Other Ambulatory Visit: Payer: Self-pay

## 2022-05-21 ENCOUNTER — Inpatient Hospital Stay (HOSPITAL_COMMUNITY)
Admission: EM | Admit: 2022-05-21 | Discharge: 2022-05-28 | DRG: 493 | Disposition: A | Discharge status: Skilled Nursing Facility | Payer: PPO | Attending: Internal Medicine | Admitting: Internal Medicine

## 2022-05-21 DIAGNOSIS — Z4889 Encounter for other specified surgical aftercare: Secondary | ICD-10-CM | POA: Diagnosis not present

## 2022-05-21 DIAGNOSIS — G500 Trigeminal neuralgia: Secondary | ICD-10-CM | POA: Diagnosis not present

## 2022-05-21 DIAGNOSIS — M810 Age-related osteoporosis without current pathological fracture: Secondary | ICD-10-CM | POA: Diagnosis present

## 2022-05-21 DIAGNOSIS — S82142A Displaced bicondylar fracture of left tibia, initial encounter for closed fracture: Secondary | ICD-10-CM | POA: Diagnosis not present

## 2022-05-21 DIAGNOSIS — Z981 Arthrodesis status: Secondary | ICD-10-CM | POA: Diagnosis not present

## 2022-05-21 DIAGNOSIS — F32A Depression, unspecified: Secondary | ICD-10-CM

## 2022-05-21 DIAGNOSIS — Z8711 Personal history of peptic ulcer disease: Secondary | ICD-10-CM | POA: Diagnosis not present

## 2022-05-21 DIAGNOSIS — K2990 Gastroduodenitis, unspecified, without bleeding: Secondary | ICD-10-CM | POA: Diagnosis not present

## 2022-05-21 DIAGNOSIS — F419 Anxiety disorder, unspecified: Secondary | ICD-10-CM | POA: Diagnosis present

## 2022-05-21 DIAGNOSIS — K589 Irritable bowel syndrome without diarrhea: Secondary | ICD-10-CM | POA: Diagnosis present

## 2022-05-21 DIAGNOSIS — Z79899 Other long term (current) drug therapy: Secondary | ICD-10-CM

## 2022-05-21 DIAGNOSIS — K2970 Gastritis, unspecified, without bleeding: Secondary | ICD-10-CM | POA: Diagnosis present

## 2022-05-21 DIAGNOSIS — K449 Diaphragmatic hernia without obstruction or gangrene: Secondary | ICD-10-CM | POA: Diagnosis not present

## 2022-05-21 DIAGNOSIS — Z9223 Personal history of estrogen therapy: Secondary | ICD-10-CM | POA: Diagnosis not present

## 2022-05-21 DIAGNOSIS — Z7901 Long term (current) use of anticoagulants: Secondary | ICD-10-CM | POA: Diagnosis not present

## 2022-05-21 DIAGNOSIS — Z79818 Long term (current) use of other agents affecting estrogen receptors and estrogen levels: Secondary | ICD-10-CM

## 2022-05-21 DIAGNOSIS — Z9889 Other specified postprocedural states: Secondary | ICD-10-CM | POA: Diagnosis not present

## 2022-05-21 DIAGNOSIS — R9431 Abnormal electrocardiogram [ECG] [EKG]: Secondary | ICD-10-CM | POA: Diagnosis not present

## 2022-05-21 DIAGNOSIS — R531 Weakness: Secondary | ICD-10-CM | POA: Diagnosis not present

## 2022-05-21 DIAGNOSIS — G8929 Other chronic pain: Secondary | ICD-10-CM | POA: Diagnosis present

## 2022-05-21 DIAGNOSIS — S82142D Displaced bicondylar fracture of left tibia, subsequent encounter for closed fracture with routine healing: Secondary | ICD-10-CM | POA: Diagnosis not present

## 2022-05-21 DIAGNOSIS — E538 Deficiency of other specified B group vitamins: Secondary | ICD-10-CM | POA: Diagnosis present

## 2022-05-21 DIAGNOSIS — R55 Syncope and collapse: Secondary | ICD-10-CM

## 2022-05-21 DIAGNOSIS — E876 Hypokalemia: Secondary | ICD-10-CM | POA: Diagnosis present

## 2022-05-21 DIAGNOSIS — Z01818 Encounter for other preprocedural examination: Secondary | ICD-10-CM | POA: Diagnosis not present

## 2022-05-21 DIAGNOSIS — S82132A Displaced fracture of medial condyle of left tibia, initial encounter for closed fracture: Secondary | ICD-10-CM | POA: Diagnosis not present

## 2022-05-21 DIAGNOSIS — D500 Iron deficiency anemia secondary to blood loss (chronic): Secondary | ICD-10-CM | POA: Diagnosis not present

## 2022-05-21 DIAGNOSIS — K219 Gastro-esophageal reflux disease without esophagitis: Secondary | ICD-10-CM | POA: Diagnosis present

## 2022-05-21 DIAGNOSIS — I10 Essential (primary) hypertension: Secondary | ICD-10-CM | POA: Diagnosis present

## 2022-05-21 DIAGNOSIS — E7800 Pure hypercholesterolemia, unspecified: Secondary | ICD-10-CM | POA: Diagnosis present

## 2022-05-21 DIAGNOSIS — Y92009 Unspecified place in unspecified non-institutional (private) residence as the place of occurrence of the external cause: Secondary | ICD-10-CM

## 2022-05-21 DIAGNOSIS — M545 Low back pain, unspecified: Secondary | ICD-10-CM | POA: Diagnosis present

## 2022-05-21 DIAGNOSIS — D62 Acute posthemorrhagic anemia: Secondary | ICD-10-CM | POA: Diagnosis not present

## 2022-05-21 DIAGNOSIS — S8011XA Contusion of right lower leg, initial encounter: Secondary | ICD-10-CM | POA: Diagnosis not present

## 2022-05-21 DIAGNOSIS — M1990 Unspecified osteoarthritis, unspecified site: Secondary | ICD-10-CM | POA: Diagnosis not present

## 2022-05-21 DIAGNOSIS — Z8249 Family history of ischemic heart disease and other diseases of the circulatory system: Secondary | ICD-10-CM

## 2022-05-21 DIAGNOSIS — D72829 Elevated white blood cell count, unspecified: Secondary | ICD-10-CM | POA: Diagnosis present

## 2022-05-21 DIAGNOSIS — W19XXXD Unspecified fall, subsequent encounter: Secondary | ICD-10-CM | POA: Diagnosis not present

## 2022-05-21 DIAGNOSIS — Z4789 Encounter for other orthopedic aftercare: Secondary | ICD-10-CM | POA: Diagnosis not present

## 2022-05-21 DIAGNOSIS — Z886 Allergy status to analgesic agent status: Secondary | ICD-10-CM

## 2022-05-21 DIAGNOSIS — D529 Folate deficiency anemia, unspecified: Secondary | ICD-10-CM | POA: Diagnosis not present

## 2022-05-21 DIAGNOSIS — G629 Polyneuropathy, unspecified: Secondary | ICD-10-CM | POA: Diagnosis not present

## 2022-05-21 DIAGNOSIS — G43909 Migraine, unspecified, not intractable, without status migrainosus: Secondary | ICD-10-CM | POA: Diagnosis present

## 2022-05-21 DIAGNOSIS — S82102A Unspecified fracture of upper end of left tibia, initial encounter for closed fracture: Secondary | ICD-10-CM | POA: Diagnosis not present

## 2022-05-21 DIAGNOSIS — W109XXA Fall (on) (from) unspecified stairs and steps, initial encounter: Secondary | ICD-10-CM | POA: Diagnosis present

## 2022-05-21 DIAGNOSIS — Z743 Need for continuous supervision: Secondary | ICD-10-CM | POA: Diagnosis not present

## 2022-05-21 DIAGNOSIS — Z79890 Hormone replacement therapy (postmenopausal): Secondary | ICD-10-CM

## 2022-05-21 DIAGNOSIS — E039 Hypothyroidism, unspecified: Secondary | ICD-10-CM

## 2022-05-21 DIAGNOSIS — M25462 Effusion, left knee: Secondary | ICD-10-CM | POA: Diagnosis not present

## 2022-05-21 DIAGNOSIS — Z8669 Personal history of other diseases of the nervous system and sense organs: Secondary | ICD-10-CM | POA: Diagnosis not present

## 2022-05-21 DIAGNOSIS — W19XXXA Unspecified fall, initial encounter: Secondary | ICD-10-CM | POA: Diagnosis not present

## 2022-05-21 DIAGNOSIS — F418 Other specified anxiety disorders: Secondary | ICD-10-CM | POA: Diagnosis present

## 2022-05-21 DIAGNOSIS — M25562 Pain in left knee: Secondary | ICD-10-CM | POA: Diagnosis not present

## 2022-05-21 DIAGNOSIS — M1612 Unilateral primary osteoarthritis, left hip: Secondary | ICD-10-CM | POA: Diagnosis not present

## 2022-05-21 LAB — CBC WITH DIFFERENTIAL/PLATELET
Abs Immature Granulocytes: 0.05 10*3/uL (ref 0.00–0.07)
Basophils Absolute: 0 10*3/uL (ref 0.0–0.1)
Basophils Relative: 0 %
Eosinophils Absolute: 0 10*3/uL (ref 0.0–0.5)
Eosinophils Relative: 0 %
HCT: 38.4 % (ref 36.0–46.0)
Hemoglobin: 12.6 g/dL (ref 12.0–15.0)
Immature Granulocytes: 0 %
Lymphocytes Relative: 8 %
Lymphs Abs: 0.9 10*3/uL (ref 0.7–4.0)
MCH: 29.5 pg (ref 26.0–34.0)
MCHC: 32.8 g/dL (ref 30.0–36.0)
MCV: 89.9 fL (ref 80.0–100.0)
Monocytes Absolute: 0.7 10*3/uL (ref 0.1–1.0)
Monocytes Relative: 6 %
Neutro Abs: 10.4 10*3/uL — ABNORMAL HIGH (ref 1.7–7.7)
Neutrophils Relative %: 86 %
Platelets: 304 10*3/uL (ref 150–400)
RBC: 4.27 MIL/uL (ref 3.87–5.11)
RDW: 12.8 % (ref 11.5–15.5)
WBC: 12.2 10*3/uL — ABNORMAL HIGH (ref 4.0–10.5)
nRBC: 0 % (ref 0.0–0.2)

## 2022-05-21 LAB — BASIC METABOLIC PANEL
Anion gap: 4 — ABNORMAL LOW (ref 5–15)
BUN: 18 mg/dL (ref 8–23)
CO2: 23 mmol/L (ref 22–32)
Calcium: 9.1 mg/dL (ref 8.9–10.3)
Chloride: 113 mmol/L — ABNORMAL HIGH (ref 98–111)
Creatinine, Ser: 0.8 mg/dL (ref 0.44–1.00)
GFR, Estimated: >60 mL/min (ref >60)
Glucose, Bld: 125 mg/dL — ABNORMAL HIGH (ref 70–99)
Potassium: 2.5 mmol/L — CL (ref 3.5–5.1)
Sodium: 140 mmol/L (ref 135–145)

## 2022-05-21 LAB — MAGNESIUM: Magnesium: 1.9 mg/dL (ref 1.7–2.4)

## 2022-05-21 LAB — TROPONIN I (HIGH SENSITIVITY): Troponin I (High Sensitivity): 5 ng/L (ref <18)

## 2022-05-21 MED ORDER — TOPIRAMATE 25 MG PO TABS
50.0000 mg | ORAL_TABLET | Freq: Two times a day (BID) | ORAL | Status: DC
  Administered 2022-05-21 – 2022-05-28 (×13): 50 mg via ORAL
  Filled 2022-05-21 (×13): qty 2

## 2022-05-21 MED ORDER — GABAPENTIN 300 MG PO CAPS: 300.0000 mg | ORAL_CAPSULE | ORAL | Status: DC

## 2022-05-21 MED ORDER — FLUOXETINE HCL 20 MG PO CAPS
40.0000 mg | ORAL_CAPSULE | Freq: Every day | ORAL | Status: DC
  Administered 2022-05-23 – 2022-05-28 (×6): 40 mg via ORAL
  Filled 2022-05-21 (×6): qty 2

## 2022-05-21 MED ORDER — POTASSIUM CHLORIDE 10 MEQ/100ML IV SOLN
10.0000 meq | Freq: Once | INTRAVENOUS | Status: AC
  Administered 2022-05-21: 10 meq via INTRAVENOUS
  Filled 2022-05-21: qty 100

## 2022-05-21 MED ORDER — GABAPENTIN 100 MG PO CAPS
100.0000 mg | ORAL_CAPSULE | Freq: Three times a day (TID) | ORAL | Status: DC
  Administered 2022-05-21 – 2022-05-28 (×18): 100 mg via ORAL
  Filled 2022-05-21 (×18): qty 1

## 2022-05-21 MED ORDER — POTASSIUM CHLORIDE 10 MEQ/100ML IV SOLN
10.0000 meq | INTRAVENOUS | Status: AC
  Administered 2022-05-21 – 2022-05-22 (×3): 10 meq via INTRAVENOUS
  Filled 2022-05-21 (×2): qty 100

## 2022-05-21 MED ORDER — LEVOTHYROXINE SODIUM 75 MCG PO TABS
75.0000 ug | ORAL_TABLET | Freq: Every day | ORAL | Status: DC
  Administered 2022-05-23 – 2022-05-28 (×6): 75 ug via ORAL
  Filled 2022-05-21 (×6): qty 1

## 2022-05-21 MED ORDER — HYDROMORPHONE HCL 1 MG/ML IJ SOLN
0.5000 mg | INTRAMUSCULAR | Status: DC | PRN
  Administered 2022-05-21 – 2022-05-24 (×10): 1 mg via INTRAVENOUS
  Filled 2022-05-21 (×10): qty 1

## 2022-05-21 MED ORDER — OXYCODONE HCL 5 MG PO TABS
5.0000 mg | ORAL_TABLET | ORAL | Status: DC | PRN
  Administered 2022-05-23 – 2022-05-26 (×9): 5 mg via ORAL
  Filled 2022-05-21 (×10): qty 1

## 2022-05-21 MED ORDER — SUMATRIPTAN SUCCINATE 25 MG PO TABS
100.0000 mg | ORAL_TABLET | ORAL | Status: DC | PRN
  Administered 2022-05-23 – 2022-05-25 (×2): 100 mg via ORAL
  Filled 2022-05-21 (×2): qty 4

## 2022-05-21 MED ORDER — MECLIZINE HCL 25 MG PO TABS: 25.0000 mg | ORAL_TABLET | Freq: Three times a day (TID) | ORAL | Status: DC | PRN

## 2022-05-21 MED ORDER — ALPRAZOLAM 0.5 MG PO TABS
0.5000 mg | ORAL_TABLET | Freq: Every evening | ORAL | Status: DC | PRN
  Administered 2022-05-23 – 2022-05-27 (×3): 0.5 mg via ORAL
  Filled 2022-05-21 (×3): qty 1

## 2022-05-21 MED ORDER — POLYETHYLENE GLYCOL 3350 17 G PO PACK: 17.0000 g | PACK | Freq: Every day | ORAL | Status: DC | PRN

## 2022-05-21 MED ORDER — HYDROMORPHONE HCL 1 MG/ML IJ SOLN
1.0000 mg | Freq: Once | INTRAMUSCULAR | Status: AC
  Administered 2022-05-21: 1 mg via INTRAVENOUS
  Filled 2022-05-21: qty 1

## 2022-05-21 MED ORDER — ONDANSETRON HCL 4 MG/2ML IJ SOLN
4.0000 mg | Freq: Once | INTRAMUSCULAR | Status: AC
  Administered 2022-05-21: 4 mg via INTRAVENOUS
  Filled 2022-05-21: qty 2

## 2022-05-21 MED ORDER — SODIUM CHLORIDE 0.9 % IV SOLN: INTRAVENOUS | Status: DC

## 2022-05-21 MED ORDER — POTASSIUM CHLORIDE CRYS ER 20 MEQ PO TBCR
60.0000 meq | EXTENDED_RELEASE_TABLET | Freq: Once | ORAL | Status: AC
  Administered 2022-05-21: 60 meq via ORAL
  Filled 2022-05-21: qty 3

## 2022-05-21 MED ORDER — SODIUM CHLORIDE 0.9% FLUSH
3.0000 mL | Freq: Two times a day (BID) | INTRAVENOUS | Status: DC
  Administered 2022-05-22 – 2022-05-28 (×13): 3 mL via INTRAVENOUS

## 2022-05-21 MED ORDER — SPIRONOLACTONE 25 MG PO TABS
25.0000 mg | ORAL_TABLET | Freq: Every day | ORAL | Status: DC
  Administered 2022-05-23 – 2022-05-28 (×6): 25 mg via ORAL
  Filled 2022-05-21 (×7): qty 1

## 2022-05-21 NOTE — ED Notes (Signed)
Ortho paged. 

## 2022-05-21 NOTE — ED Provider Notes (Addendum)
[facility] DEPT Provider Note   CSN: [id number] Arrival date & time: 05/21/22  1759     History  Chief Complaint  Patient presents with   Loss of Consciousness   Leg Pain   Fall    Caitlin Irwin is a 71 y.o. female.  71 year old female presents after falling down stairs today.  Patient states became dizzy and lightheaded and fell onto her left knee.  Patient had a syncopal event prior to the fall.  Is concerned of possible fracture.  States that she did not have any chest pain or chest pressure prior to the event.  Has had decreased oral intake recently according to EMS who I did get history from.  Patient has severe pain in her left knee and could not stand.  Was given 75 mcg of fentanyl prior to arrival.  Patient only complains of pain to her left knee.  Denies any left foot or ankle pain.  No left hip pain.  No neck or head discomfort at this time.       Home Medications Prior to Admission medications   Medication Sig Start Date End Date Taking? Authorizing Provider  ALPRAZolam Duanne Moron) 0.5 MG tablet Take 0.5 mg by mouth at bedtime as needed for anxiety.    [provider]  estradiol (ESTRACE) 1 MG tablet Take 1 mg by mouth daily.  04/21/13   [provider]  FLUoxetine (PROZAC) 40 MG capsule Take 40 mg by mouth daily.    [provider]  gabapentin (NEURONTIN) 100 MG capsule Take 100 mg by mouth 3 (three) times daily.    [provider]  gabapentin (NEURONTIN) 300 MG capsule Take 1 capsule (300 mg total) by mouth every morning. Patient not taking: Reported on 06/26/2021 06/09/19   Tegeler, Gwenyth Allegra, MD  levothyroxine (SYNTHROID, LEVOTHROID) 75 MCG tablet Take 75 mcg by mouth daily before breakfast.  04/09/17   [provider]  meclizine (ANTIVERT) 25 MG tablet Take 1 tablet (25 mg total) by mouth 3 (three) times daily as needed for dizziness. 06/27/21   Oswald Hillock, MD  Multiple Vitamin  (MULTIVITAMIN WITH MINERALS) TABS tablet Take 1 tablet by mouth daily.    [provider]  SUMAtriptan (IMITREX) 100 MG tablet Take 1 tablet (100 mg total) by mouth every 2 (two) hours as needed for migraine. May repeat in 2 hours if headache persists or recurs. 06/09/19   Tegeler, Gwenyth Allegra, MD  topiramate (TOPAMAX) 50 MG tablet Take 50 mg by mouth 2 (two) times daily.    [provider]  vitamin B-12 (CYANOCOBALAMIN) 1000 MCG tablet Take 1 tablet (1,000 mcg total) by mouth daily. 06/27/21   Oswald Hillock, MD  eletriptan (RELPAX) 40 MG tablet One tablet by mouth as needed for migraine headache.  If the headache improves and then returns, dose may be repeated after 2 hours have elapsed since first dose (do not exceed 80 mg per day). may repeat in 2 hours if necessary  10/20/11  [provider]  ferrous sulfate 325 (65 FE) MG tablet Take 325 mg by mouth daily with breakfast.  10/20/11  [provider]      Allergies    Acetaminophen    Review of Systems   Review of Systems  All other systems reviewed and are negative.   Physical Exam Updated Vital Signs BP (!) 151/75   Pulse 84   Temp 98.6 F (37 C) (Oral)   Resp 13  SpO2 98%  Physical Exam Vitals and nursing note reviewed.  Constitutional:      General: She is not in acute distress.    Appearance: Normal appearance. She is well-developed. She is not toxic-appearing.  HENT:     Head: Normocephalic and atraumatic.  Eyes:     General: Lids are normal.     Conjunctiva/sclera: Conjunctivae normal.     Pupils: Pupils are equal, round, and reactive to light.  Neck:     Thyroid: No thyroid mass.     Trachea: No tracheal deviation.  Cardiovascular:     Rate and Rhythm: Normal rate and regular rhythm.     Heart sounds: Normal heart sounds. No murmur heard.    No gallop.  Pulmonary:     Effort: Pulmonary effort is normal. No respiratory distress.     Breath sounds: Normal breath sounds. No  stridor. No decreased breath sounds, wheezing, rhonchi or rales.  Abdominal:     General: There is no distension.     Palpations: Abdomen is soft.     Tenderness: There is no abdominal tenderness. There is no rebound.  Musculoskeletal:     Cervical back: Normal range of motion and neck supple.     Left knee: Decreased range of motion. Tenderness present over the patellar tendon.       Legs:  Skin:    General: Skin is warm and dry.     Findings: No abrasion or rash.  Neurological:     Mental Status: She is alert and oriented to person, place, and time. Mental status is at baseline.     GCS: GCS eye subscore is 4. GCS verbal subscore is 5. GCS motor subscore is 6.     Cranial Nerves: No cranial nerve deficit.     Sensory: No sensory deficit.     Motor: Motor function is intact.  Psychiatric:        Attention and Perception: Attention normal.        Speech: Speech normal.        Behavior: Behavior normal.     ED Results / Procedures / Treatments   Labs (all labs ordered are listed, but only abnormal results are displayed) Labs Reviewed  CBC WITH DIFFERENTIAL/PLATELET  BASIC METABOLIC PANEL    EKG None ED ECG REPORT   Date: 05/21/2022  Rate: 85  Rhythm: normal sinus rhythm  QRS Axis: normal  Intervals: normal  ST/T Wave abnormalities: normal  Conduction Disutrbances:left anterior fascicular block  Narrative Interpretation:   Old EKG Reviewed: none available  I have personally reviewed the EKG tracing and agree with the computerized printout as noted.  Radiology No results found.  Procedures Procedures    Medications Ordered in ED Medications  0.9 %  sodium chloride infusion (has no administration in time range)    ED Course/ Medical Decision Making/ A&P                           Medical Decision Making Amount and/or Complexity of Data Reviewed Labs: ordered. Radiology: ordered.  Risk Prescription drug management.  X-ray of patient's left knee shows  possible tibial plateau fracture.  This was confirmed by a CT of the extremity which confirmed a mildly displaced left tibial plateau fracture.  These were also were reviewed and interpreted by me.  Case discussed with Dr. Laurance Flatten, orthopedic surgeon on-call, who recommends knee immobilizer and he will follow-up with the patient in the office.  Patient did require pain medication here. Patient is EKG per interpretation shows normal sinus rhythm.  Electrolytes are significant for hypokalemia potassium of 2.5. Due to patient's syncopal event just prior to arrival as well as need for pain control patient will require overnight admission for observation   9:51 PM Received phone call from orthopedic surgeon who had a chance to review the patient's films.  His plan now is that she would likely require surgery.  He will come and see the patient this evening.  Agrees with hospitalization  CRITICAL CARE Performed by: Leota Jacobsen Total critical care time: 45 minutes Critical care time was exclusive of separately billable procedures and treating other patients. Critical care was necessary to treat or prevent imminent or life-threatening deterioration. Critical care was time spent personally by me on the following activities: development of treatment plan with patient and/or surrogate as well as nursing, discussions with consultants, evaluation of patient's response to treatment, examination of patient, obtaining history from patient or surrogate, ordering and performing treatments and interventions, ordering and review of laboratory studies, ordering and review of radiographic studies, pulse oximetry and re-evaluation of patient's condition.        Final Clinical Impression(s) / ED Diagnoses Final diagnoses:  None    Rx / DC Orders ED Discharge Orders     None         Lacretia Leigh, MD 05/21/22 2105    Lacretia Leigh, MD 05/21/22 2152

## 2022-05-21 NOTE — ED Triage Notes (Signed)
Per EMS- Patient had a syncopal event and fell down 2 steps. Patient reported to EMS that she has syncope quite often.  Patient c/o pain below the left knee along with swelling.  Patient was given Fentanyl 75 mcg IV prior to arrival to the ED

## 2022-05-21 NOTE — H&P (Signed)
History and Physical   Caitlin Irwin KXF:[id number] DOB: [birth year] DOA: 05/21/2022  PCP: Michael Boston, MD   Patient coming from: Home  Chief Complaint: Fall  HPI: Caitlin Irwin is a 71 y.o. female with medical history significant of migraine, depression, anxiety, hypothyroidism, IBS, gastritis, gastric ulcer, GERD, anemia, chronic pain, sedation presenting after fall.  Patient was walking downstairs at home and became dizzy and/lightheaded and fell down the stairs and landed on her left knee.  She experienced subsequent pain and concerned about fracture.  She denies loss of consciousness.  She did not hit her head and is not on any blood thinners.  She has been eating and drinking a little bit less recently but for no particular reason.  She denies fevers, chills, chest pain, shortness of breath, abdominal pain, constipation, diarrhea, nausea, vomiting.  ED Course: Vital signs in the ED significant for blood pressure in the 696V systolic.  Lab work included BMP with potassium of 2.5, chloride 113, glucose 125.  CBC with leukocytosis 12.2.  Troponin pending.  Imaging included left knee x-ray showing age-indeterminate tibial plateau fracture on the medial side with moderate suprapatellar effusion.  CT of the left knee showed an acute mildly depressed medial tibial plateau fracture with a large suprapatellar effusion.  Patient received Dilaudid, Zofran, 60 mEq of p.o. potassium and 10 mill equivalents of IV potassium in the ED.  Also received IV fluids at 125 cc an hour.  Orthopedics consulted and after review of CT imaging state they will see the patient and she will likely need surgery.  Review of Systems: As per HPI otherwise all other systems reviewed and are negative.  Past Medical History:  Diagnosis Date   Accidental poisoning by aspirin 09/2014   Anemia    Anxiety    Arthritis    "right knee; right shoulder" (09/15/2015)   Ataxia 03/24/2014   Cataract    both eyes   Chronic  lower back pain    scoliosis   Depression    GERD (gastroesophageal reflux disease)    H/O hiatal hernia    High cholesterol    History of blood transfusion    "related to OR"   Hypersomnia    IBS (irritable bowel syndrome)    Migraines    "maybe once/week now" (09/15/2015)   Osteoporosis    Other constipation    Parotid tumor    PONV (postoperative nausea and vomiting)    hx of " getting too much" - 1998, slow to take up by body    PUD (peptic ulcer disease)    Trigeminal neuralgia    S/P radiation therapy    Unspecified hypothyroidism     Past Surgical History:  Procedure Laterality Date   ABDOMINAL HYSTERECTOMY     ANKLE FRACTURE SURGERY Left    ANTERIOR LUMBAR FUSION  04/2010   L4-5; L5-S1   BACK SURGERY     BRAIN SURGERY     parotid tumor removed    CHOLECYSTECTOMY OPEN     EPIGASTRIC HERNIA REPAIR  09/15/2015   Procedure: OPEN HERNIA REPAIR EPIGASTRIC ADULT;  Surgeon: Greer Pickerel, MD;  Location: [facility];  Service: General;;   ESOPHAGOGASTRODUODENOSCOPY  06/12/2012   Procedure: ESOPHAGOGASTRODUODENOSCOPY (EGD);  Surgeon: Lear Ng, MD;  Location: [facility];  Service: Endoscopy;  Laterality: N/A;   EXTERNAL FIXATION LEG Right 11/14/2018   Procedure: EXTERNAL FIXATION LEG;  Surgeon: Hiram Gash, MD;  Location: [facility];  Service: Orthopedics;  Laterality: Right;  FRACTURE SURGERY     gamma knife for trig neuralgia  12/27/11   HERNIA REPAIR     KNEE CARTILAGE SURGERY Right 1981   "converted from scope to open during the OR; ligament repair"   LAMINECTOMY AND MICRODISCECTOMY LUMBAR SPINE  1980   L4-5   LAPAROSCOPIC EPIGASTRIC HERNIA REPAIR  09/15/2015   open   ORIF TIBIA PLATEAU Right 11/16/2018   Procedure: OPEN REDUCTION INTERNAL FIXATION (ORIF) TIBIAL PLATEAU;  Surgeon: Hiram Gash, MD;  Location: [facility];  Service: Orthopedics;  Laterality: Right;   PAROTID GLAND TUMOR EXCISION     benign   SHOULDER ARTHROSCOPY W/ ROTATOR CUFF REPAIR Right 11-2014    TRIGEMINAL NERVE DECOMPRESSION     to stop migraines and it did not work     Social History  reports that she has never smoked. She has never used smokeless tobacco. She reports that she does not drink alcohol and does not use drugs.  Allergies  Allergen Reactions   Acetaminophen Nausea And Vomiting    Family History  Problem Relation Age of Onset   Ovarian cancer Mother    Migraines Mother    Diabetes Father    Migraines Brother    Heart disease Other    Heart disease Maternal Grandfather    Colon cancer Neg Hx    Esophageal cancer Neg Hx   Reviewed on admission  Prior to Admission medications   Medication Sig Start Date End Date Taking? Authorizing Provider  ALPRAZolam Duanne Moron) 0.5 MG tablet Take 0.5 mg by mouth at bedtime as needed for anxiety.    [provider]  estradiol (ESTRACE) 1 MG tablet Take 1 mg by mouth daily.  04/21/13   [provider]  FLUoxetine (PROZAC) 40 MG capsule Take 40 mg by mouth daily.    [provider]  gabapentin (NEURONTIN) 100 MG capsule Take 100 mg by mouth 3 (three) times daily.    [provider]  gabapentin (NEURONTIN) 300 MG capsule Take 1 capsule (300 mg total) by mouth every morning. 06/09/19   Tegeler, Gwenyth Allegra, MD  levothyroxine (SYNTHROID, LEVOTHROID) 75 MCG tablet Take 75 mcg by mouth daily before breakfast.  04/09/17   [provider]  meclizine (ANTIVERT) 25 MG tablet Take 1 tablet (25 mg total) by mouth 3 (three) times daily as needed for dizziness. 06/27/21   Oswald Hillock, MD  Multiple Vitamin (MULTIVITAMIN WITH MINERALS) TABS tablet Take 1 tablet by mouth daily.    [provider]  spironolactone (ALDACTONE) 25 MG tablet Take 25 mg by mouth daily. 05/06/22   [provider]  SUMAtriptan (IMITREX) 100 MG tablet Take 1 tablet (100 mg total) by mouth every 2 (two) hours as needed for migraine. May repeat in 2 hours if headache persists or recurs. 06/09/19   Tegeler,  Gwenyth Allegra, MD  topiramate (TOPAMAX) 50 MG tablet Take 50 mg by mouth 2 (two) times daily.    [provider]  vitamin B-12 (CYANOCOBALAMIN) 1000 MCG tablet Take 1 tablet (1,000 mcg total) by mouth daily. 06/27/21   Oswald Hillock, MD  eletriptan (RELPAX) 40 MG tablet One tablet by mouth as needed for migraine headache.  If the headache improves and then returns, dose may be repeated after 2 hours have elapsed since first dose (do not exceed 80 mg per day). may repeat in 2 hours if necessary  10/20/11  [provider]  ferrous sulfate 325 (65 FE) MG tablet Take 325 mg by mouth  daily with breakfast.  10/20/11  [provider]    Physical Exam: Vitals:   05/21/22 1818 05/21/22 1829 05/21/22 2158  BP:  (!) 151/75 (!) 104/92  Pulse:  84 100  Resp:  13 19  Temp:  98.6 F (37 C)   TempSrc:  Oral   SpO2: 97% 98% 96%    Physical Exam Constitutional:      General: She is in acute distress (Moderate distress due to pain).     Appearance: Normal appearance.  HENT:     Head: Normocephalic and atraumatic.     Mouth/Throat:     Mouth: Mucous membranes are moist.     Pharynx: Oropharynx is clear.  Eyes:     Extraocular Movements: Extraocular movements intact.     Pupils: Pupils are equal, round, and reactive to light.  Cardiovascular:     Rate and Rhythm: Normal rate and regular rhythm.     Pulses: Normal pulses.     Heart sounds: Normal heart sounds.  Pulmonary:     Effort: Pulmonary effort is normal. No respiratory distress.     Breath sounds: Normal breath sounds.  Abdominal:     General: Bowel sounds are normal. There is no distension.     Palpations: Abdomen is soft.     Tenderness: There is no abdominal tenderness.  Musculoskeletal:        General: Tenderness present. No swelling or deformity.     Comments: Left knee effusion  Skin:    General: Skin is warm and dry.  Neurological:     General: No focal deficit present.     Mental Status: Mental  status is at baseline.    Labs on Admission: I have personally reviewed following labs and imaging studies  CBC: Recent Labs  Lab 05/21/22 1907  WBC 12.2*  NEUTROABS 10.4*  HGB 12.6  HCT 38.4  MCV 89.9  PLT 696    Basic Metabolic Panel: Recent Labs  Lab 05/21/22 1907  NA 140  K 2.5*  CL 113*  CO2 23  GLUCOSE 125*  BUN 18  CREATININE 0.80  CALCIUM 9.1    GFR: CrCl cannot be calculated (Unknown ideal weight.).  Liver Function Tests: No results for input(s): "AST", "ALT", "ALKPHOS", "BILITOT", "PROT", "ALBUMIN" in the last 168 hours.  Urine analysis:    Component Value Date/Time   COLORURINE YELLOW 05/12/2021 1050   APPEARANCEUR CLOUDY (A) 05/12/2021 1050   LABSPEC 1.014 05/12/2021 1050   PHURINE 5.0 05/12/2021 1050   GLUCOSEU NEGATIVE 05/12/2021 1050   HGBUR NEGATIVE 05/12/2021 [place] 05/12/2021 1050   KETONESUR NEGATIVE 05/12/2021 1050   PROTEINUR NEGATIVE 05/12/2021 1050   UROBILINOGEN 0.2 08/28/2014 1930   NITRITE NEGATIVE 05/12/2021 1050   LEUKOCYTESUR TRACE (A) 05/12/2021 1050    Radiological Exams on Admission: CT Knee Left Wo Contrast  Result Date: 05/21/2022 CLINICAL DATA:  Tibial plateau fracture after fall. EXAM: CT OF THE LEFT KNEE WITHOUT CONTRAST TECHNIQUE: Multidetector CT imaging of the left knee was performed according to the standard protocol. Multiplanar CT image reconstructions were also generated. RADIATION DOSE REDUCTION: This exam was performed according to the departmental dose-optimization program which includes automated exposure control, adjustment of the mA and/or kV according to patient size and/or use of iterative reconstruction technique. COMPARISON:  Radiograph of same day. FINDINGS: Acute mildly depressed fracture is seen involving the medial tibial plateau. Distal femur is unremarkable. Visualized proximal fibula is unremarkable. Large suprapatellar joint effusion is noted. IMPRESSION: Acute  mildly depressed  medial tibial plateau fracture. Large suprapatellar joint effusion. Electronically Signed   By: Marijo Conception M.D.   On: 05/21/2022 20:19   DG Knee Complete 4 Views Left  Result Date: 05/21/2022 CLINICAL DATA:  Left knee pain. EXAM: LEFT KNEE - COMPLETE 4+ VIEW COMPARISON:  Left knee radiograph dated 09/27/2007. FINDINGS: Age indeterminate, likely subacute, minimally depressed fracture of the medial tibial plateau extending from the articular surface to the medial proximal tibial cortex. No acute fracture. The bones are osteopenic. Degenerative changes of the knee with mild joint space narrowing and meniscal chondrocalcinosis. There is a moderate size suprapatellar effusion. The soft tissues are unremarkable. IMPRESSION: 1. Age indeterminate, likely subacute, fracture of the medial tibial plateau. 2. Moderate size suprapatellar effusion. Electronically Signed   By: Anner Crete M.D.   On: 05/21/2022 18:48    EKG: Not performed in the emergency department  Assessment/Plan Principal Problem:   Tibial plateau fracture, left, closed, initial encounter Active Problems:   Hypothyroidism   History of peptic ulcer   Gastritis and gastroduodenitis   Irritable bowel syndrome   Hx of migraine headaches   Esophageal reflux   Depression   Tibial plateau fracture > Patient presenting after a fall down the stairs preceded by dizziness and lightheadedness as below. > Landed on her left knee with subsequent significant pain.  Imaging shows acute mildly depressed medial tibial plateau fracture with large suprapatellar effusion. > Orthopedics consulted and states she would likely need surgery based on imaging they will see the patient. - Appreciate orthopedics recommendations - Monitor on telemetry - As needed oxycodone for moderate to severe pain and.  Dilaudid for severe pain while n.p.o. and breakthrough pain - Supportive care  Hypokalemia > Patient did have potassium of 2.5 in the ED.  >  Received 60 mEq p.o. and 10 mEq of IV potassium in the ED. - Monitor on telemetry as above - We will add additional 30 mEq IV potassium - Check magnesium  - Trend renal function and electrolytes  Leukocytosis > Leukocytosis 12.2 in the ED.  No obvious evidence of infection.  Presumed reactive in the setting of fall today. - Trend CBC  Lightheadedness/dizziness Pre-syncope > No true syncopal event.  Patient did not lose consciousness she just became lightheaded and dizzy and lost her balance and fell down stairs. > Could consider additional presyncopal work-up however this time we will continue to correct electrolytes and monitor. > Is hypertensive without evidence of dehydration at this time.  We will hold off on full orthostatics given her significant knee pain and injury. > Appears to have some history of dizziness that she was prescribed meclizine.  I did later note prior history of near syncope in chart. - Continue to monitor for now - Correct electrolytes as above  Migraines - Continue home Topamax and sumatriptan  Trigeminal neuralgia - Continue home gabapentin  Hypertension - Continue home spironolactone  Anxiety Depression - Continue home fluoxetine and Xanax  Hypothyroidism - Continue home Synthroid  DVT prophylaxis: SCDs Code Status:   Full Family Communication:  None on admission. Disposition Plan:   Patient is from:  Home  Anticipated DC to:  Home  Anticipated DC date:  1 to 3 days  Anticipated DC barriers: None  Consults called:  Orthopedics, consulted in the ED, will see the patient. Admission status:  Observation, pulmonary  Severity of Illness: The appropriate patient status for this patient is OBSERVATION. Observation status is judged to be reasonable and  necessary in order to provide the required intensity of service to ensure the patient's safety. The patient's presenting symptoms, physical exam findings, and initial radiographic and laboratory data  in the context of their medical condition is felt to place them at decreased risk for further clinical deterioration. Furthermore, it is anticipated that the patient will be medically stable for discharge from the hospital within 2 midnights of admission.    Marcelyn Bruins MD [facility]  How to contact the [facility] Attending or Consulting provider [place] or covering provider during after hours [place], for this patient?   Check the care team in [facility] and look for a) attending/consulting [facility] provider listed and b) the [facility] team listed Log into [email or web address] and use [place]'s universal password to access. If you do not have the password, please contact the hospital operator. Locate the [facility] provider you are looking for under [facility] and page to a number that you can be directly reached. If you still have difficulty reaching the provider, please page the [facility] (Director on Call) for the Hospitalists listed on amion for assistance.  05/21/2022, 10:04 PM

## 2022-05-21 NOTE — Consult Note (Addendum)
Orthopedic Surgery Note  Assessment: Patient is a 71 y.o. female with left medial tibial plateau fracture   Plan: -Tentatively planning for OR tomorrow for ORIF -Ordered full length tibia XRs -Diet: NPO at midnight -Okay for dvt ppx -Antibiotics: Ancef on call to OR -Weight bearing status: NWB LLE in KI -PT/OT evaluate and treat post-operatively -Pain control -Admitted to medicine for pre-operative optimization and hypokalemia   Discussed recommendation for operative intervention in the form of left medial tibial plateau ORIF. Explained the risks of this procedure included, but were not limited to: nonunion, malunion, hardware failure, infection, bleeding, stiffness, neurovascular injury, need for additional procedures, deep vein thrombosis, pulmonary embolism, and death. The benefits of this procedure would be to promote fracture healing by providing stability and to heal the fracture in the appropriate alignment. The alternatives of this surgery would be to treat the fracture with immobilization in a splint/brace/cast or to do no intervention. The patient's questions were answered to their satisfaction. After this discussion, patient elected to proceed with surgery.   ___________________________________________________________________________   Chief complaint: left knee pain  History:  Patient is a 71 y.o. female who fell earlier tonight and landed on her left knee. Noted immediate onset of pain and soon noticed swelling. No pain elsewhere besides the left knee Pain location: left knee  Severity of pain: tolerable with medication Mechanism of injury: fall Duration of symptoms: since this evening Paresthesias and numbness: denies   Past medical history:  Ancixety Anemia Depression GERD HLD IBS Osteoporosis  Trigeminal neuralgia  Allergies: tylenol   Past surgical history:  Hysterectomy Back surgery Ankle fracture surgery Right knee fracture surgery Trigeminal  neuralgia surgery Hernia repair  Parotid gland tumor excision Rotator cuff repair  Social history: Denies use of nicotine-containing products (cigarettes, vaping, smokeless, etc.) Alcohol use: denies Denies use of recreational drugs   Physical Exam:  General: no acute distress, appears stated age, laying in bed Neurologic: alert, answering questions appropriately, following commands Cardiovascular: regular rate, no cyanosis Respiratory: unlabored breathing on room air, symmetric chest rise Psychiatric: appropriate affect, normal cadence to speech  MSK:   -Bilateral upper extremities  No tenderness to palpation over extremity Fires deltoid, biceps, triceps, wrist extensors, wrist flexors, finger extensors, finger flexors  AIN/PIN/IO intact  Palpable radial pulse  Sensation intact to light touch in median/ulnar/radial/axillary nerve distributions  Hand warm and well perfused  -RLE  No tenderness to palpation over the extremity, no gross deformity Fires hip flexors, quadriceps, hamstrings, tibialis anterior, gastrocnemius and soleus, extensor hallucis longus Plantarflexes and dorsiflexes toes Sensation intact to light touch in sural, saphenous, tibial, deep peroneal, and superficial peroneal nerve distributions Foot warm and well perfused, palpable dorsalis pedis pulse   -LLE  TTP over the left knee and proximal tibia, no other tenderness to palpation  Swelling around the knee Fires hip flexors, tibialis anterior, gastrocnemius and soleus, extensor hallucis longus Refuses to fire hamstrings and quadriceps due to pain Plantarflexes and dorsiflexes toes Sensation intact to light touch in sural, saphenous, tibial, deep peroneal, and superficial peroneal nerve distributions Foot warm and well perfused, palpable dorsalis pedis pulse   No pain with passive stretch at toes or ankle. Compartments in leg soft and compressible ABIs obtained on my examination and were 1.05 on the left  side  Imaging: XR of the left knee from 05/21/2022 was independently reviewed and interpreted, showing left medial tibial plateau fracture that is minimally displaced  CT of the left knee from 05/21/2022 was independently reviewed and  interpreted, showing a minimally displaced left medial tibial plateau fracture   Patient name: Caitlin Irwin Patient MRN: [id number] Date: 05/21/22

## 2022-05-21 NOTE — ED Triage Notes (Signed)
Patient reports falling down the stairs and now believes she broke her knee. She complains of pain in the left knee.

## 2022-05-22 ENCOUNTER — Encounter (HOSPITAL_COMMUNITY)
Admission: EM | Disposition: A | Discharge status: Skilled Nursing Facility | Payer: Self-pay | Source: Home / Self Care | Attending: Internal Medicine

## 2022-05-22 ENCOUNTER — Inpatient Hospital Stay (HOSPITAL_COMMUNITY): Payer: PPO

## 2022-05-22 ENCOUNTER — Observation Stay (HOSPITAL_COMMUNITY): Payer: PPO | Admitting: Certified Registered Nurse Anesthetist

## 2022-05-22 ENCOUNTER — Other Ambulatory Visit: Payer: Self-pay

## 2022-05-22 ENCOUNTER — Encounter (HOSPITAL_COMMUNITY): Payer: Self-pay | Admitting: Internal Medicine

## 2022-05-22 DIAGNOSIS — M1990 Unspecified osteoarthritis, unspecified site: Secondary | ICD-10-CM

## 2022-05-22 DIAGNOSIS — Z981 Arthrodesis status: Secondary | ICD-10-CM | POA: Diagnosis not present

## 2022-05-22 DIAGNOSIS — R55 Syncope and collapse: Secondary | ICD-10-CM | POA: Diagnosis present

## 2022-05-22 DIAGNOSIS — I10 Essential (primary) hypertension: Secondary | ICD-10-CM | POA: Diagnosis present

## 2022-05-22 DIAGNOSIS — Y92009 Unspecified place in unspecified non-institutional (private) residence as the place of occurrence of the external cause: Secondary | ICD-10-CM | POA: Diagnosis not present

## 2022-05-22 DIAGNOSIS — Z79818 Long term (current) use of other agents affecting estrogen receptors and estrogen levels: Secondary | ICD-10-CM | POA: Diagnosis not present

## 2022-05-22 DIAGNOSIS — K219 Gastro-esophageal reflux disease without esophagitis: Secondary | ICD-10-CM

## 2022-05-22 DIAGNOSIS — E876 Hypokalemia: Secondary | ICD-10-CM

## 2022-05-22 DIAGNOSIS — M545 Low back pain, unspecified: Secondary | ICD-10-CM | POA: Diagnosis present

## 2022-05-22 DIAGNOSIS — S82142A Displaced bicondylar fracture of left tibia, initial encounter for closed fracture: Secondary | ICD-10-CM

## 2022-05-22 DIAGNOSIS — F419 Anxiety disorder, unspecified: Secondary | ICD-10-CM | POA: Diagnosis present

## 2022-05-22 DIAGNOSIS — G43909 Migraine, unspecified, not intractable, without status migrainosus: Secondary | ICD-10-CM | POA: Diagnosis present

## 2022-05-22 DIAGNOSIS — M810 Age-related osteoporosis without current pathological fracture: Secondary | ICD-10-CM | POA: Diagnosis present

## 2022-05-22 DIAGNOSIS — E039 Hypothyroidism, unspecified: Secondary | ICD-10-CM

## 2022-05-22 DIAGNOSIS — Z8249 Family history of ischemic heart disease and other diseases of the circulatory system: Secondary | ICD-10-CM | POA: Diagnosis not present

## 2022-05-22 DIAGNOSIS — Z886 Allergy status to analgesic agent status: Secondary | ICD-10-CM | POA: Diagnosis not present

## 2022-05-22 DIAGNOSIS — K589 Irritable bowel syndrome without diarrhea: Secondary | ICD-10-CM | POA: Diagnosis present

## 2022-05-22 DIAGNOSIS — Z79890 Hormone replacement therapy (postmenopausal): Secondary | ICD-10-CM | POA: Diagnosis not present

## 2022-05-22 DIAGNOSIS — Z79899 Other long term (current) drug therapy: Secondary | ICD-10-CM | POA: Diagnosis not present

## 2022-05-22 DIAGNOSIS — D62 Acute posthemorrhagic anemia: Secondary | ICD-10-CM | POA: Diagnosis not present

## 2022-05-22 DIAGNOSIS — S82132A Displaced fracture of medial condyle of left tibia, initial encounter for closed fracture: Secondary | ICD-10-CM

## 2022-05-22 DIAGNOSIS — S8011XA Contusion of right lower leg, initial encounter: Secondary | ICD-10-CM | POA: Diagnosis not present

## 2022-05-22 DIAGNOSIS — W109XXA Fall (on) (from) unspecified stairs and steps, initial encounter: Secondary | ICD-10-CM | POA: Diagnosis present

## 2022-05-22 DIAGNOSIS — F32A Depression, unspecified: Secondary | ICD-10-CM | POA: Diagnosis present

## 2022-05-22 DIAGNOSIS — Z9223 Personal history of estrogen therapy: Secondary | ICD-10-CM | POA: Diagnosis not present

## 2022-05-22 DIAGNOSIS — E538 Deficiency of other specified B group vitamins: Secondary | ICD-10-CM | POA: Diagnosis present

## 2022-05-22 DIAGNOSIS — G8929 Other chronic pain: Secondary | ICD-10-CM | POA: Diagnosis present

## 2022-05-22 DIAGNOSIS — E7800 Pure hypercholesterolemia, unspecified: Secondary | ICD-10-CM | POA: Diagnosis present

## 2022-05-22 DIAGNOSIS — Z8711 Personal history of peptic ulcer disease: Secondary | ICD-10-CM | POA: Diagnosis not present

## 2022-05-22 HISTORY — PX: ORIF TIBIA PLATEAU: SHX2132

## 2022-05-22 LAB — SURGICAL PCR SCREEN
MRSA, PCR: NEGATIVE
Staphylococcus aureus: NEGATIVE

## 2022-05-22 LAB — CBC
HCT: 32.7 % — ABNORMAL LOW (ref 36.0–46.0)
Hemoglobin: 10.6 g/dL — ABNORMAL LOW (ref 12.0–15.0)
MCH: 29.4 pg (ref 26.0–34.0)
MCHC: 32.4 g/dL (ref 30.0–36.0)
MCV: 90.6 fL (ref 80.0–100.0)
Platelets: 236 10*3/uL (ref 150–400)
RBC: 3.61 MIL/uL — ABNORMAL LOW (ref 3.87–5.11)
RDW: 12.9 % (ref 11.5–15.5)
WBC: 7.5 10*3/uL (ref 4.0–10.5)
nRBC: 0 % (ref 0.0–0.2)

## 2022-05-22 LAB — COMPREHENSIVE METABOLIC PANEL
ALT: 23 U/L (ref 0–44)
AST: 34 U/L (ref 15–41)
Albumin: 3.3 g/dL — ABNORMAL LOW (ref 3.5–5.0)
Alkaline Phosphatase: 76 U/L (ref 38–126)
Anion gap: 4 — ABNORMAL LOW (ref 5–15)
BUN: 16 mg/dL (ref 8–23)
CO2: 24 mmol/L (ref 22–32)
Calcium: 9.5 mg/dL (ref 8.9–10.3)
Chloride: 111 mmol/L (ref 98–111)
Creatinine, Ser: 0.76 mg/dL (ref 0.44–1.00)
GFR, Estimated: >60 mL/min (ref >60)
Glucose, Bld: 121 mg/dL — ABNORMAL HIGH (ref 70–99)
Potassium: 3.4 mmol/L — ABNORMAL LOW (ref 3.5–5.1)
Sodium: 139 mmol/L (ref 135–145)
Total Bilirubin: 0.5 mg/dL (ref 0.3–1.2)
Total Protein: 5.6 g/dL — ABNORMAL LOW (ref 6.5–8.1)

## 2022-05-22 LAB — TROPONIN I (HIGH SENSITIVITY): Troponin I (High Sensitivity): 6 ng/L (ref <18)

## 2022-05-22 SURGERY — OPEN REDUCTION INTERNAL FIXATION (ORIF) TIBIAL PLATEAU
Anesthesia: General | Site: Knee | Laterality: Left

## 2022-05-22 MED ORDER — TRANEXAMIC ACID-NACL 1000-0.7 MG/100ML-% IV SOLN
INTRAVENOUS | Status: AC
  Filled 2022-05-22: qty 100

## 2022-05-22 MED ORDER — FENTANYL CITRATE (PF) 250 MCG/5ML IJ SOLN
INTRAMUSCULAR | Status: DC | PRN
  Administered 2022-05-22 (×2): 100 ug via INTRAVENOUS
  Administered 2022-05-22: 50 ug via INTRAVENOUS

## 2022-05-22 MED ORDER — OXYCODONE HCL 5 MG PO TABS: 5.0000 mg | ORAL_TABLET | ORAL | 0 refills | Status: DC | PRN

## 2022-05-22 MED ORDER — FENTANYL CITRATE (PF) 250 MCG/5ML IJ SOLN
INTRAMUSCULAR | Status: AC
  Filled 2022-05-22: qty 5

## 2022-05-22 MED ORDER — ASPIRIN 325 MG PO TABS
325.0000 mg | ORAL_TABLET | Freq: Two times a day (BID) | ORAL | Status: DC
  Administered 2022-05-23 – 2022-05-28 (×11): 325 mg via ORAL
  Filled 2022-05-22 (×11): qty 1

## 2022-05-22 MED ORDER — ROCURONIUM BROMIDE 10 MG/ML (PF) SYRINGE
PREFILLED_SYRINGE | INTRAVENOUS | Status: DC | PRN
  Administered 2022-05-22: 10 mg via INTRAVENOUS
  Administered 2022-05-22: 50 mg via INTRAVENOUS
  Administered 2022-05-22: 10 mg via INTRAVENOUS

## 2022-05-22 MED ORDER — METHOCARBAMOL 500 MG IVPB - SIMPLE MED
500.0000 mg | Freq: Once | INTRAVENOUS | Status: AC
  Administered 2022-05-22: 500 mg via INTRAVENOUS
  Filled 2022-05-22: qty 500

## 2022-05-22 MED ORDER — 0.9 % SODIUM CHLORIDE (POUR BTL) OPTIME
TOPICAL | Status: DC | PRN
  Administered 2022-05-22: 1000 mL

## 2022-05-22 MED ORDER — SUGAMMADEX SODIUM 200 MG/2ML IV SOLN
INTRAVENOUS | Status: DC | PRN
  Administered 2022-05-22: 200 mg via INTRAVENOUS

## 2022-05-22 MED ORDER — MIDAZOLAM HCL 2 MG/2ML IJ SOLN
INTRAMUSCULAR | Status: DC | PRN
  Administered 2022-05-22: .5 mg via INTRAVENOUS

## 2022-05-22 MED ORDER — ENSURE SURGERY PO LIQD
237.0000 mL | Freq: Two times a day (BID) | ORAL | Status: DC
  Administered 2022-05-23 – 2022-05-28 (×3): 237 mL via ORAL
  Filled 2022-05-22 (×12): qty 237

## 2022-05-22 MED ORDER — DEXAMETHASONE SODIUM PHOSPHATE 10 MG/ML IJ SOLN
INTRAMUSCULAR | Status: DC | PRN
  Administered 2022-05-22: 5 mg via INTRAVENOUS

## 2022-05-22 MED ORDER — TRANEXAMIC ACID-NACL 1000-0.7 MG/100ML-% IV SOLN
1000.0000 mg | INTRAVENOUS | Status: AC
  Administered 2022-05-22: 1000 mg via INTRAVENOUS

## 2022-05-22 MED ORDER — ASPIRIN 325 MG PO TBEC: 325.0000 mg | DELAYED_RELEASE_TABLET | Freq: Two times a day (BID) | ORAL | 0 refills | Status: DC

## 2022-05-22 MED ORDER — ROCURONIUM BROMIDE 10 MG/ML (PF) SYRINGE
PREFILLED_SYRINGE | INTRAVENOUS | Status: AC
  Filled 2022-05-22: qty 10

## 2022-05-22 MED ORDER — PHENYLEPHRINE HCL (PRESSORS) 10 MG/ML IV SOLN
INTRAVENOUS | Status: AC
  Filled 2022-05-22: qty 1

## 2022-05-22 MED ORDER — ONDANSETRON HCL 4 MG/2ML IJ SOLN
INTRAMUSCULAR | Status: DC | PRN
  Administered 2022-05-22: 4 mg via INTRAVENOUS

## 2022-05-22 MED ORDER — CEFAZOLIN SODIUM-DEXTROSE 2-4 GM/100ML-% IV SOLN
2.0000 g | INTRAVENOUS | Status: AC
  Administered 2022-05-22: 2 g via INTRAVENOUS
  Filled 2022-05-22: qty 100

## 2022-05-22 MED ORDER — ONDANSETRON HCL 4 MG/2ML IJ SOLN
4.0000 mg | Freq: Once | INTRAMUSCULAR | Status: AC
  Administered 2022-05-22: 4 mg via INTRAVENOUS
  Filled 2022-05-22: qty 2

## 2022-05-22 MED ORDER — ORAL CARE MOUTH RINSE: 15.0000 mL | OROMUCOSAL | Status: DC | PRN

## 2022-05-22 MED ORDER — AMISULPRIDE (ANTIEMETIC) 5 MG/2ML IV SOLN
INTRAVENOUS | Status: AC
  Filled 2022-05-22: qty 4

## 2022-05-22 MED ORDER — PROPOFOL 10 MG/ML IV BOLUS
INTRAVENOUS | Status: AC
  Filled 2022-05-22: qty 20

## 2022-05-22 MED ORDER — HYDRALAZINE HCL 20 MG/ML IJ SOLN: 10.0000 mg | Freq: Four times a day (QID) | INTRAMUSCULAR | Status: DC | PRN

## 2022-05-22 MED ORDER — AMISULPRIDE (ANTIEMETIC) 5 MG/2ML IV SOLN
10.0000 mg | Freq: Once | INTRAVENOUS | Status: AC
  Administered 2022-05-22: 10 mg via INTRAVENOUS

## 2022-05-22 MED ORDER — PHENYLEPHRINE 80 MCG/ML (10ML) SYRINGE FOR IV PUSH (FOR BLOOD PRESSURE SUPPORT)
PREFILLED_SYRINGE | INTRAVENOUS | Status: DC | PRN
  Administered 2022-05-22 (×2): 160 ug via INTRAVENOUS
  Administered 2022-05-22 (×2): 80 ug via INTRAVENOUS
  Administered 2022-05-22: 240 ug via INTRAVENOUS
  Administered 2022-05-22 (×2): 160 ug via INTRAVENOUS

## 2022-05-22 MED ORDER — SENNA 8.6 MG PO TABS: 1.0000 | ORAL_TABLET | Freq: Two times a day (BID) | ORAL | 0 refills | Status: DC

## 2022-05-22 MED ORDER — LIDOCAINE HCL (CARDIAC) PF 100 MG/5ML IV SOSY
PREFILLED_SYRINGE | INTRAVENOUS | Status: DC | PRN
  Administered 2022-05-22: 80 mg via INTRAVENOUS

## 2022-05-22 MED ORDER — SCOPOLAMINE 1 MG/3DAYS TD PT72
MEDICATED_PATCH | TRANSDERMAL | Status: AC
  Filled 2022-05-22: qty 1

## 2022-05-22 MED ORDER — SENNA 8.6 MG PO TABS: 1.0000 | ORAL_TABLET | Freq: Two times a day (BID) | ORAL | 0 refills | Status: AC

## 2022-05-22 MED ORDER — ASPIRIN 325 MG PO TBEC: 325.0000 mg | DELAYED_RELEASE_TABLET | Freq: Two times a day (BID) | ORAL | 0 refills | Status: AC

## 2022-05-22 MED ORDER — PROPOFOL 10 MG/ML IV BOLUS
INTRAVENOUS | Status: DC | PRN
  Administered 2022-05-22: 150 mg via INTRAVENOUS

## 2022-05-22 MED ORDER — PHENYLEPHRINE HCL-NACL 20-0.9 MG/250ML-% IV SOLN
INTRAVENOUS | Status: DC | PRN
  Administered 2022-05-22: 40 ug/min via INTRAVENOUS

## 2022-05-22 MED ORDER — SCOPOLAMINE 1 MG/3DAYS TD PT72
MEDICATED_PATCH | TRANSDERMAL | Status: DC | PRN
  Administered 2022-05-22: 1 via TRANSDERMAL

## 2022-05-22 MED ORDER — OXYCODONE HCL 5 MG PO TABS: 5.0000 mg | ORAL_TABLET | ORAL | 0 refills | Status: AC | PRN

## 2022-05-22 MED ORDER — LACTATED RINGERS IV SOLN: INTRAVENOUS | Status: DC | PRN

## 2022-05-22 MED ORDER — LIDOCAINE HCL (PF) 2 % IJ SOLN
INTRAMUSCULAR | Status: AC
  Filled 2022-05-22: qty 5

## 2022-05-22 MED ORDER — CEFAZOLIN SODIUM-DEXTROSE 2-4 GM/100ML-% IV SOLN
2.0000 g | Freq: Three times a day (TID) | INTRAVENOUS | Status: AC
  Administered 2022-05-22 (×2): 2 g via INTRAVENOUS
  Filled 2022-05-22 (×2): qty 100

## 2022-05-22 MED ORDER — MUPIROCIN 2 % EX OINT
1.0000 | TOPICAL_OINTMENT | Freq: Two times a day (BID) | CUTANEOUS | Status: DC
  Administered 2022-05-22: 1 via NASAL
  Filled 2022-05-22: qty 22

## 2022-05-22 MED ORDER — HYDROMORPHONE HCL 1 MG/ML IJ SOLN
INTRAMUSCULAR | Status: AC
  Filled 2022-05-22: qty 2

## 2022-05-22 MED ORDER — MIDAZOLAM HCL 2 MG/2ML IJ SOLN
INTRAMUSCULAR | Status: AC
  Filled 2022-05-22: qty 2

## 2022-05-22 MED ORDER — HYDROMORPHONE HCL 1 MG/ML IJ SOLN
0.2500 mg | INTRAMUSCULAR | Status: DC | PRN
  Administered 2022-05-22 (×2): 0.5 mg via INTRAVENOUS

## 2022-05-22 MED ORDER — DOCUSATE SODIUM 100 MG PO CAPS
100.0000 mg | ORAL_CAPSULE | Freq: Two times a day (BID) | ORAL | Status: DC
  Administered 2022-05-22 – 2022-05-25 (×7): 100 mg via ORAL
  Filled 2022-05-22 (×7): qty 1

## 2022-05-22 SURGICAL SUPPLY — 71 items
APL SKNCLS STERI-STRIP NONHPOA (GAUZE/BANDAGES/DRESSINGS) ×1
BAG COUNTER SPONGE SURGICOUNT (BAG) IMPLANT
BAG SPEC THK2 15X12 ZIP CLS (MISCELLANEOUS)
BAG SPNG CNTER NS LX DISP (BAG) ×1
BAG ZIPLOCK 12X15 (MISCELLANEOUS) ×2 IMPLANT
BANDAGE ESMARK 6X9 LF (GAUZE/BANDAGES/DRESSINGS) ×2 IMPLANT
BENZOIN TINCTURE PRP APPL 2/3 (GAUZE/BANDAGES/DRESSINGS) IMPLANT
BIT DRILL QC 2.5MM SHRT EVO SM (DRILL) IMPLANT
BNDG CMPR 9X6 STRL LF SNTH (GAUZE/BANDAGES/DRESSINGS) ×1
BNDG ELASTIC 6X5.8 VLCR STR LF (GAUZE/BANDAGES/DRESSINGS) IMPLANT
BNDG ESMARK 6X9 LF (GAUZE/BANDAGES/DRESSINGS) ×1
COVER SURGICAL LIGHT HANDLE (MISCELLANEOUS) ×2 IMPLANT
CUFF TOURN SGL QUICK 34 (TOURNIQUET CUFF) ×1
CUFF TRNQT CYL 34X4.125X (TOURNIQUET CUFF) ×2 IMPLANT
DRAPE C-ARM 42X120 X-RAY (DRAPES) ×2 IMPLANT
DRAPE C-ARMOR (DRAPES) IMPLANT
DRAPE EXTREMITY T 121X128X90 (DISPOSABLE) IMPLANT
DRAPE SHEET LG 3/4 BI-LAMINATE (DRAPES) ×2 IMPLANT
DRAPE U-SHAPE 47X51 STRL (DRAPES) ×2 IMPLANT
DRILL QC 2.5MM SHORT EVOS SM (DRILL) ×1
DRSG TEGADERM 4X4.75 (GAUZE/BANDAGES/DRESSINGS) IMPLANT
DURAPREP 26ML APPLICATOR (WOUND CARE) ×2 IMPLANT
ELECT REM PT RETURN 15FT ADLT (MISCELLANEOUS) ×2 IMPLANT
GAUZE SPONGE 4X4 12PLY STRL (GAUZE/BANDAGES/DRESSINGS) IMPLANT
GAUZE XEROFORM 1X8 LF (GAUZE/BANDAGES/DRESSINGS) IMPLANT
GLOVE BIO SURGEON STRL SZ7.5 (GLOVE) ×2 IMPLANT
GLOVE BIOGEL PI IND STRL 7.0 (GLOVE) ×4 IMPLANT
GLOVE BIOGEL PI IND STRL 7.5 (GLOVE) IMPLANT
GLOVE BIOGEL PI IND STRL 8 (GLOVE) ×6 IMPLANT
GOWN STRL REUS W/ TWL LRG LVL3 (GOWN DISPOSABLE) ×6 IMPLANT
GOWN STRL REUS W/TWL LRG LVL3 (GOWN DISPOSABLE) ×3
K-WIRE 1.6 (WIRE) ×2
K-WIRE FX150X1.6XTROC PNT (WIRE) ×2
KIT BASIN OR (CUSTOM PROCEDURE TRAY) ×2 IMPLANT
KIT TURNOVER KIT A (KITS) IMPLANT
KWIRE FX150X1.6XTROC PNT (WIRE) IMPLANT
MANIFOLD NEPTUNE II (INSTRUMENTS) ×2 IMPLANT
PACK TOTAL JOINT (CUSTOM PROCEDURE TRAY) ×2 IMPLANT
PAD CAST 4YDX4 CTTN HI CHSV (CAST SUPPLIES) ×4 IMPLANT
PADDING CAST COTTON 4X4 STRL (CAST SUPPLIES)
PLATE TIB EVOS 3.5X117 8H LT (Plate) IMPLANT
PLATE TIB EVOS 3.5X91 6H LT (Plate) IMPLANT
PROTECTOR NERVE ULNAR (MISCELLANEOUS) ×2 IMPLANT
SCREW CORT 3.5X17 ST EVOS (Screw) IMPLANT
SCREW CORT 3.5X19 ST EVOS (Screw) IMPLANT
SCREW CORT 3.5X20 ST EVOS (Screw) IMPLANT
SCREW CORT EVOS ST 3.5X20 (Screw) IMPLANT
SCREW CORT EVOS ST 3.5X28 (Screw) IMPLANT
SCREW CORTEX 3.5X26 (Screw) IMPLANT
SCREW LOCK 3.5X50MM EVOS (Screw) IMPLANT
SCREW LOCK 3.5X65MM (Screw) IMPLANT
SCREW LOCK CORTICAL 3.5X42 (Screw) IMPLANT
STOCKINETTE 8 INCH (MISCELLANEOUS) ×2 IMPLANT
STRIP CLOSURE SKIN 1/2X4 (GAUZE/BANDAGES/DRESSINGS) ×2 IMPLANT
SUCTION FRAZIER HANDLE 10FR (MISCELLANEOUS)
SUCTION TUBE FRAZIER 10FR DISP (MISCELLANEOUS) ×2 IMPLANT
SUT ETHILON 3 0 PS 1 (SUTURE) IMPLANT
SUT MNCRL AB 4-0 PS2 18 (SUTURE) ×2 IMPLANT
SUT MON AB 3-0 SH 27 (SUTURE) ×2
SUT MON AB 3-0 SH27 (SUTURE) IMPLANT
SUT PROLENE 3 0 SH 48 (SUTURE) IMPLANT
SUT VIC AB 0 CT1 18XCR BRD 8 (SUTURE) IMPLANT
SUT VIC AB 0 CT1 36 (SUTURE) IMPLANT
SUT VIC AB 0 CT1 8-18 (SUTURE) ×2
SUT VIC AB 0 CT2 27 (SUTURE) IMPLANT
SUT VIC AB 1 CT1 27 (SUTURE)
SUT VIC AB 1 CT1 27XBRD ANTBC (SUTURE) ×10 IMPLANT
SUT VIC AB 2-0 CT1 18 (SUTURE) IMPLANT
SUT VIC AB 2-0 CT2 27 (SUTURE) ×2 IMPLANT
TOWEL OR 17X26 10 PK STRL BLUE (TOWEL DISPOSABLE) ×4 IMPLANT
WATER STERILE IRR 1000ML POUR (IV SOLUTION) ×2 IMPLANT

## 2022-05-22 NOTE — Anesthesia Postprocedure Evaluation (Signed)
Anesthesia Post Note  Patient: Caitlin Irwin  Procedure(s) Performed: OPEN REDUCTION INTERNAL FIXATION (ORIF) TIBIAL PLATEAU (Left: Knee)     Patient location during evaluation: PACU Anesthesia Type: General Level of consciousness: awake Pain management: pain level controlled Respiratory status: spontaneous breathing Cardiovascular status: stable Postop Assessment: no apparent nausea or vomiting Anesthetic complications: no   No notable events documented.  Last Vitals:  Vitals:   05/22/22 1515 05/22/22 1530  BP: (!) 112/95 116/76  Pulse: 78 69  Resp: 13 (!) 9  Temp:    SpO2: 100% 97%    Last Pain:  Vitals:   05/22/22 1502  TempSrc:   PainSc: 10-Worst pain ever                 Fannie Alomar

## 2022-05-22 NOTE — Op Note (Addendum)
Orthopedic Spine Surgery Operative Report  Procedure: Open reduction internal fixation left bicondylar tibial plateau fracture with medial and lateral plating (CPT 27536)  Modifier: none  Date of procedure: 05/22/2022  Patient name: Caitlin Irwin MRN: [id number] DOB: [birth year]  Surgeon: Ileene Rubens, MD Assistant: none Pre-operative diagnosis: left medial tibial plateau fracture Post-operative diagnosis: left bicondylar tibial plateau fracture Findings: acute bicondylar tibial plateau fracture with hematoma in superior anterior compartment  Specimens: none Anesthesia: general EBL: [id number] Complications: none Pre-incision antibiotic: ancef  Implants:  Implant Name Type Inv. Item Serial No. Manufacturer Lot No. LRB No. Used Action  SCREW CORTEX 3.5X26 - [id number] Screw SCREW CORTEX 3.5X26  [employer]  Left 1 Implanted  SCREW CORT EVOS ST 3.5X28 - [id number] Screw SCREW CORT EVOS ST 3.5X28  [employer]  Left 1 Implanted and Explanted  SCREW CORT 3.5X19 ST EVOS - [id number] Screw SCREW CORT 3.5X19 ST EVOS  [employer]  Left 2 Implanted  LEFT PH 8H PROX MEDIAL TIBIA PLATE      Left 1 Implanted  SCREW LOCK CORTICAL 3.5X42 - [id number] Screw SCREW LOCK CORTICAL 3.5X42  [employer]  Left 1 Implanted and Explanted  3.5 LEFT P LATERAL PROXIMAL TIBIAL PLATE 6H      Left 1 Implanted  SCREW LOCK 3.5X65MM - [id number] Screw SCREW LOCK 3.5X65MM  [employer]  Left 3 Implanted  SCREW LOCK 3.5X50MM EVOS - [id number] Screw SCREW LOCK 3.5X50MM EVOS  [employer]  Left 1 Implanted  SCREW CORT 3.5X17 ST EVOS - [id number] Screw SCREW CORT 3.5X17 ST EVOS  [employer]  Left 1 Implanted  SCREW CORT 3.5X20 ST EVOS - [id number] Screw SCREW CORT 3.5X20 ST EVOS  [employer]  Left 1 Implanted  SCREW CORT EVOS ST 3.5X20 - [id number] Screw SCREW CORT EVOS ST 3.5X20  [employer]  Left 1 Implanted      Indication for procedure: Patient is a 71 y.o. female who presented to the emergency department after a fall. She had left knee pain and work up revealed a left medial plateau fracture. Patient had some swelling around the knee but wrinkling of the skin was present. Operative management in the form of left tibial plateau fracture open reduction internal fixation was discussed with the patient. The risks including but not limited to nonunion, malunion, hardware failure, infection, bleeding, popliteal artery injury, saphenous nerve injury, postraumatic arthritis, stiffness, and death were discussed with the patient. The benefits and alternatives including non-operative management were discussed with her. All patient questions were answered to her satisfaction. After this discussion, patient elected to proceed and plan was made for operative management the next day when medically optimized. She was admitted to a medicine service.   Procedure Description: The patient was met in the pre-operative holding area. The patient's identity and consent were verified. The operative site was marked. The patient's remaining questions about the surgery were answered. The patient was brought back to the operating room. General anesthesia was induced and an endotracheal tube was placed by the anesthesia staff. The patient was transferred to the flat top [place] table in the supine position. All bony prominences were well padded. A well padded tourniquet was placed over the left thigh. The surgical area was cleansed with a CHG scrub brush and then alcohol. The patient's skin was then prepped and draped in a standard, sterile fashion. A time out was performed that  identified the patient, the procedure, and the operative level. All team members agreed with what was stated in the time out.   A esmarch was used to exsanguinate the leg and the tourniquet was inflated to 379mHg. An incision  was made in line with the medial epicondyle of the femur and the posterior border of the tibia. It was taken from the medial joint line to midshaft of the tibia. The incision was taken down through skin and dermis. Metzenbaum scissors were used to bluntly dissect down to the medial tibia. The hamstring tendons were identified. A periosteal elevator was used to elevate the hamstrings off of the medial tibia. The same wood handle elevator was used to elevate the periosteum off of the fracture site. A 15 blade knife was then used to clean the fracture edges. Irrigation was used to clear the hematoma around the fracture edges. A synovial rongeur was used to remove soft tissue within the fracture site. Once all soft tissue had been removed from the fracture site. A slight valgus maneuver was performed to reduce the fracture. An 8 hole medial plate was applied to the proximal medial tibia. Adjustments were made under AP and lateral fluoroscopy until the plate was in a satisfactory position. K wires were advanced through the plate to hold it in place. The tourniquet was let down at this point. The drill was then used to drill through the hole just distal to the apex of the media plateau fracture. A depth gauge was used to measure the length of the screw. That length 3.546mcortical screw was then advanced into the drilled hole. Three more holes were then drilled into the medial plate.  These holes were measured with a depth gauge and the measured size 3.48m32mortical screw was inserted into each of these holes. All screws had good purchase. The medial plateau fracture appeared to be well reduced and the fixation in satisfactory position. It was at this time that there was a noted fracture line going through the lateral plateau on the fluoroscopic films. Decision was made that an additional lateral plate needed to be applied to hold that fracture reduced. A gentle curvilinear incision was made over the anterior lateral tibia  and over Gerdy's tubercle just past the joint line. Incision was taken sharply down through skin and dermis. Electrocautery was used to dissect down to the anterior compartment fascia. A knife was used to incise the anterior compartment fascia along the lateral aspect of the tibia and was continued through the anterior aspect of the IT band. A cobb elevator was used to elevate the anterior compartment musculature off of the lateral tibia. The lateral fracture line was visualized. A submeniscal arthrotomy was performed to visualize and palpate the lateral joint surface. The meniscus was tagged with two 3-0 prolene sutures. A 6 hole lateral plate was applied over the lateral surface of the tibia. The fracture was slightly displaced and it was felt that the plate could used to elevate the joint line and reduce the fracture. The plate was moved under AP and lateral fluoroscopy until it was in a satisfactory position. The drill was used to drill in an oblique fashion just distal to the apex of the fracture. A depth gauge was used to estimate the screw size. That sized 3.48mm14mrtical screw was then advanced into the hole. After it was tightened down to the bone, the fracture appeared reduced. Next, a locking guide was applied to the most proximal holes. The drill was used  to drill a hole parallel to the articular surface under AP fluoroscopic guidance. A depth gauge was used to measure the screw length. That size 3.47m locking screw was then advanced under AP fluoroscopic guidance. This same process for this most proximal hole was then repeated to insert three more 3.529mlocking screws. Then, the drill was used to drill the most distal hole in the plate. A depth gauge was used to estimate the length. That size 3.72m10mortical screw was inserted into the plate. The same process for the last screw was repeated again except a 3.72mm90mcking screw was inserted into the third to most distal hole in the lateral plate. The lateral  joint was visualized through the submeniscal arthrotomy and palpated with a freer. The lateral joint was felt to be reduced. Final AP and lateral fluoroscopic films were taken that showed satisfactory fracture reduction and position of the fixation. Screws were final tightened and all were felt to have good purchase. The wounds were then copiously irrigated with sterile saline. Hemostasis was achieved. A 0 vicryl was placed through the tagged meniscus and was used to close the arthrotomy. Another 0 vicryl was used to close the more anterior portion of the submeniscal arthrotomy. The anterior compartment fascia and IT band was closed over the lateral plate with 0 vicryl. The subcutaneous fat was reapproximated using 0 vicryl. 2-0 vicryl was used to close the deep dermal layer. A 3-0 nylon was used to close the skin in a horizontal mattress fashion. Medially, the subcutaneous fat was reapproximated using 0 vicryl. 2-0 vicryl was used to close the deep dermal layer. 3-0 nylon was used to close the skin in a horizontal mattress fashion. The wounds were dressed with xeroform, gauze, tegaderm and an ace wrap. All counts were correct at the end of the case. A final AP and lateral plain film were taken that showed satisfactory reduction and placement of the fixation. A knee immobilizer was placed over the leg. Patient was awakened from anesthesia and brought back to the PACU in stable condition.   Post-operative plan: Patient will recover in PACU and then return to the floor on a medicine service. Patient will get 2 post-operative doses of ancef. She will be non-weight bearing on that left lower extremity in a knee immobilizer for 2 weeks.  She should work with physical therapy post-operatively and her disposition will be determined from there.   MichIleene Rubenshopedic Surgeon

## 2022-05-22 NOTE — Progress Notes (Addendum)
Orthopedic Surgery Progress Note   Assessment: Patient is a 71 y.o. female with left bicondylar tibial plateau fracture status post ORIF   Plan: -Operative plans: complete -Diet: regular -Ensure ordered -DVT ppx: aspirin '325mg'$  BID -Antibiotics: ancef x2 post-op doses -Weight bearing status: NWB LLE in knee immobilizer -PT/OT evaluate and treat -Pain control -Narcotic pain medications, aspirin and senna prescriptions placed into her physical chart for when she is discharged -Ortho discharge instructions were placed into the discharge instruction tab in Epic -Dispo: pending recovery from surgery and PT/OT  ___________________________________________________________________________  Subjective: No acute events since surgery. Having a lot of left knee pain in PACU. Getting pain medications to help with the pain. Denies paresthesias and numbness.    Physical Exam:  General: waking from anesthesia, laying in bed Neurologic: awake, following commands with repeated instruction Respiratory: unlabored breathing on supplemental O2, symmetric chest rise  MSK:   -Left lower extremity  Knee immobilizer in place Plantarflexes and dorsiflexes toes and ankle Sensation intact to light touch in sural, saphenous, tibial, deep peroneal, and superficial peroneal nerve distributions Foot warm and well perfused, palpable radial pulse   Patient name: Caitlin Irwin Patient MRN: [id number] Date: 05/22/22

## 2022-05-22 NOTE — Progress Notes (Signed)
Bladder scan resulted in >700 cc. Dr Tana Coast notified of result. Order received for I&O cath. I&O cath successful on 3rd attempt and resulted in 1700 cc of clear yellow urine. Nicie Milan, Laurel Dimmer, RN

## 2022-05-22 NOTE — Brief Op Note (Signed)
05/22/2022  3:05 PM  PATIENT:  Caitlin Irwin  71 y.o. female  PRE-OPERATIVE DIAGNOSIS:  Left Tibial Plateau Fracture  POST-OPERATIVE DIAGNOSIS:  Left Tibial Plateau Fracture  PROCEDURE:  Procedure(s): OPEN REDUCTION INTERNAL FIXATION (ORIF) TIBIAL PLATEAU (Left)  SURGEON:  Surgeon(s) and Role:    Callie Fielding, MD - Primary  PHYSICIAN ASSISTANT:   ASSISTANTS: none   ANESTHESIA:   general  EBL:  300 mL   BLOOD ADMINISTERED:none  DRAINS: none   LOCAL MEDICATIONS USED:  NONE  SPECIMEN:  No Specimen  DISPOSITION OF SPECIMEN:  N/A  COUNTS:  YES  TOURNIQUET:   Total Tourniquet Time Documented: Thigh (Left) - 61 minutes Total: Thigh (Left) - 61 minutes   DICTATION: Case will be dictated  PLAN OF CARE: Admit to inpatient   PATIENT DISPOSITION:  PACU - hemodynamically stable.   Delay start of Pharmacological VTE agent (>24hrs) due to surgical blood loss or risk of bleeding: no, can start ASA 325 BID

## 2022-05-22 NOTE — Discharge Instructions (Addendum)
Orthopedic Surgery Discharge Instructions  Patient name: Caitlin Irwin Procedure Performed: Left tibial plateau fracture open reduction internal fixation Date of Surgery: 05/22/2022 Surgeon: Ileene Rubens, MD  Pre-operative Diagnosis: Left tibial plateau fracture Post-operative Diagnosis: Same as above  Activity: You should not bear any weight on your left lower extremity. You should be in the knee immobilizer at all times.   Incision Care: Your incision site has a dressing over it. That dressing should remain in place and dry at all times for a total of two weeks after surgery. After two weeks, you will come to the office and your incisions will be checked. If the wound has healed enough, your sutures will be taken out at that visit. Do not put cream or lotion over the surgical area. Do not submerge (e.g., take a bath, swim, go in a hot tub, etc.) until twelve weeks after surgery. There may be some bloody drainage from the incision into the dressing after surgery. This is normal. You do not need to replace the dressing. Continue to leave it in place for the two weeks as instructed above. Should the dressing become saturated with blood or drainage, please call the office for further instructions.   Medications: You have been prescribed oxycodone. This is a narcotic pain medication and should only be taken as prescribed. You should not drink alcohol or operative heavy machinery (including driving) while taking this medication. The oxycodone can cause constipation as a side effect. Please use over-the-counter miralax as instructed on the packaging to promote regular bowel movements. Aspirin is a an antiinflammatory medication that may help with pain but has been prescribed to prevent blood clots. You should take this medication twice daily.   In order to set expectations for opioid prescriptions, you will be prescribed opioids for a total of six weeks after surgery and, at two-weeks after surgery,  your opioid prescription will start to be tapered (decreased dosage and number of pills). If you have ongoing need for opioid medication six weeks after surgery, you will be referred to pain management. If you are already established with a provider that is giving you opioid medications, you should schedule an appointment with them for six weeks after surgery if you feel you are going to need another prescription. State law only allows for opioid prescriptions one week at a time. If you are running out of opioid medication near the end of the week, please call the office during business hours before running out so I can send you another prescription.   Driving: You should not drive while taking narcotic pain medications. You should also not drive until you are cleared to fully weight bear on the left lower extremity.   Diet: You are safe to resume your regular diet after surgery.   Reasons to Call the Office After Surgery: You should feel free to call the office with any concerns or questions you have in the post-operative period, but you should definitely notify the office if you develop: -shortness of breath, chest pain, or trouble breathing -excessive bleeding, drainage, redness, or swelling around the surgical site -fevers, chills, or pain that is getting worse with each passing day -persistent nausea or vomiting -new weakness in your either or your legs, new and worsening numbness/tingling in either leg -numbness in the groin, bowel or bladder incontinence -other concerns about your surgery  Follow Up Appointments: You should have an office appointment scheduled for approximately 10 days after discharge from the hospital. If you do not  remember when this appointment is or do not already have it scheduled, please call the office to schedule.   Office Information:  -Phone number: [phone] -Address: [address]       [place], [place] [address]

## 2022-05-22 NOTE — Progress Notes (Signed)
[facility]                                                                              Caitlin Irwin, is a 71 y.o. female, DOB - 11-15-1950, YOV:[id number] Admit date - 05/21/2022    Outpatient Primary MD for the patient is Jacalyn Lefevre, [name], MD  LOS - 0  days  Chief Complaint  Patient presents with   Loss of Consciousness   Leg Pain   Fall       Brief summary   Patient is a 71 year old female with history of migraines, depression, anxiety, hypothyroidism, IBS, GERD, chronic anemia, chronic pain presented after a fall at home.  Patient was walking downstairs and became dizzy, lightheaded and fell down the stairs and landed on her left knee.  Denied any loss of consciousness.  Did not hit her head.  Per patient, had been eating and drinking a little bit less recently for no particular reason. In ED, BP systolic in 741O, potassium 2.5, creatinine 0.8.  WBCs 12.2. Left knee x-ray showed age indeterminate tibial plateau fracture on the medial side with moderate suprapatellar effusion. CT left knee showed acute mildly depressed medial tibial plateau fracture with large suprapatellar effusion Orthopedics was consulted.  Assessment & Plan    Principal Problem:   Tibial plateau fracture, left, closed, with suprapatellar effusion -Continue pain control, n.p.o., IV fluids -Orthopedics consulted, plan for or today -Pain control and DVT prophylaxis per orthopedics -PT OT after surgery, tomorrow  Active Problems: Hypokalemia -Potassium 2.5 on admission, received 60 mEq p.o. and IV potassium 10 mEq, subsequently also received 30 mEq IV -Magnesium 1.9, - BMET pending this a.m.  Near syncope, dizziness -No syncopal episode.  Possibly due to profound hypokalemia -Continue IV fluid hydration, correct electrolytes -No complaints of chest pain, shortness of breath, palpitations or cardiac symptoms. -Troponins negative 6 - 5  Essential hypertension -BP likely elevated due  to pain, continue IV hydralazine as needed with parameters -Continue on Aldactone    Hypothyroidism -Continue Synthroid -Follow TSH     History of peptic ulcer, Gastritis and gastroduodenitis/ GERD -Continue PPI    Hx of migraine headaches -Currently stable, continue Topamax, sumatriptan    Depression -Continue fluoxetine, Xanax    Trigeminal neuralgia of left side of face -No acute complaints, continue gabapentin   Code Status: Full code DVT Prophylaxis:  SCDs Start: 05/21/22 2201   Level of Care: Level of care: Telemetry Family Communication: Updated patient   Disposition Plan:      Remains inpatient appropriate: Plan for OR today   Procedures:   Consultants:   Orthopedics  Antimicrobials:   Anti-infectives (From admission, onward)    Start     Dose/Rate Route Frequency Ordered Stop   05/22/22 1000  [MAR Hold]  ceFAZolin (ANCEF) IVPB 2g/100 mL premix        (MAR Hold since Sun 05/22/2022 at 0933.Hold Reason: Transfer to a Procedural area)   2 g 200 mL/hr over 30 Minutes Intravenous On call to O.R. 05/22/22 0707 05/23/22 0559          Medications  [MAR Hold] FLUoxetine  40 mg Oral  Daily   [MAR Hold] gabapentin  100 mg Oral TID   [MAR Hold] levothyroxine  75 mcg Oral Q0600   [MAR Hold] mupirocin ointment  1 Application Nasal BID   [MAR Hold] sodium chloride flush  3 mL Intravenous Q12H   [MAR Hold] spironolactone  25 mg Oral Daily   [MAR Hold] topiramate  50 mg Oral BID      Subjective:   [name] was seen and examined today.  Pain in the left knee, 8-9/10, no acute nausea vomiting or abdominal pain.  No chest pain, dizziness or lightheadedness.    Objective:   Vitals:   05/22/22 0130 05/22/22 0355 05/22/22 0712 05/22/22 0936  BP: 101/62 (!) 155/64 (!) 149/64 (!) 152/59  Pulse: 79 95 84 80  Resp: '20 20 16 14  '$ Temp:  98.1 F (36.7 C) 97.7 F (36.5 C) 98.5 F (36.9 C)  TempSrc:  Oral Oral   SpO2: 93% 99% 98% 90%  Weight:  53.8 kg     Height:  4' 9.99" (1.473 m)      Intake/Output Summary (Last 24 hours) at 05/22/2022 0940 Last data filed at 05/22/2022 0424 Gross per 24 hour  Intake 601.34 ml  Output --  Net 601.34 ml     Wt Readings from Last 3 Encounters:  05/22/22 53.8 kg  03/25/22 49.9 kg  06/26/21 54.7 kg     Exam General: Alert and oriented x 3, uncomfortable with pain Cardiovascular: S1 S2 auscultated,  RRR Respiratory: Clear to auscultation bilaterally, no wheezing Gastrointestinal: Soft, nontender, nondistended, + bowel sounds Ext: LLE in immobilizer Neuro: no new deficits, strength not assessed in LLE Psych: uncomfortable with pain    Data Reviewed:  I have personally reviewed following labs    CBC Lab Results  Component Value Date   WBC 7.5 05/22/2022   RBC 3.61 (L) 05/22/2022   HGB 10.6 (L) 05/22/2022   HCT 32.7 (L) 05/22/2022   MCV 90.6 05/22/2022   MCH 29.4 05/22/2022   PLT 236 05/22/2022   MCHC 32.4 05/22/2022   RDW 12.9 05/22/2022   LYMPHSABS 0.9 05/21/2022   MONOABS 0.7 05/21/2022   EOSABS 0.0 05/21/2022   BASOSABS 0.0 85/46/2703     Last metabolic panel Lab Results  Component Value Date   NA 140 05/21/2022   K 2.5 (LL) 05/21/2022   CL 113 (H) 05/21/2022   CO2 23 05/21/2022   BUN 18 05/21/2022   CREATININE 0.80 05/21/2022   GLUCOSE 125 (H) 05/21/2022   GFRNONAA >60 05/21/2022   GFRAA >60 01/09/2019   CALCIUM 9.1 05/21/2022   PHOS 2.3 11/27/2009   PROT 6.0 (L) 03/25/2022   ALBUMIN 3.4 (L) 03/25/2022   BILITOT 0.8 03/25/2022   ALKPHOS 97 03/25/2022   AST 21 03/25/2022   ALT 18 03/25/2022   ANIONGAP 4 (L) 05/21/2022    CBG (last 3)  No results for input(s): "GLUCAP" in the last 72 hours.    Coagulation Profile: No results for input(s): "INR", "PROTIME" in the last 168 hours.   Radiology Studies: I have personally reviewed the imaging studies  DG Tibia/Fibula Left  Result Date: 05/21/2022 CLINICAL DATA:  Closed tibia fracture. Preoperative for  surgery tomorrow. EXAM: LEFT TIBIA AND FIBULA - 2 VIEW COMPARISON:  Left knee radiographs and CT 05/21/2022 FINDINGS: Oblique fracture of the left medial tibial plateau with mild depression again demonstrated. No change in appearance since the previous study. No additional acute fractures are demonstrated in the fibula or mid/distal tibia. Postoperative  changes with plate and screw fixation of the distal left fibula and screw fixation of the medial malleolus of the distal tibia. Moderate left knee effusion is present. IMPRESSION: Mildly depressed medial left tibial plateau fracture again demonstrated. Moderate left knee effusion. Electronically Signed   By: Lucienne Capers M.D.   On: 05/21/2022 23:54   CT Knee Left Wo Contrast  Result Date: 05/21/2022 CLINICAL DATA:  Tibial plateau fracture after fall. EXAM: CT OF THE LEFT KNEE WITHOUT CONTRAST TECHNIQUE: Multidetector CT imaging of the left knee was performed according to the standard protocol. Multiplanar CT image reconstructions were also generated. RADIATION DOSE REDUCTION: This exam was performed according to the departmental dose-optimization program which includes automated exposure control, adjustment of the mA and/or kV according to patient size and/or use of iterative reconstruction technique. COMPARISON:  Radiograph of same day. FINDINGS: Acute mildly depressed fracture is seen involving the medial tibial plateau. Distal femur is unremarkable. Visualized proximal fibula is unremarkable. Large suprapatellar joint effusion is noted. IMPRESSION: Acute mildly depressed medial tibial plateau fracture. Large suprapatellar joint effusion. Electronically Signed   By: Marijo Conception M.D.   On: 05/21/2022 20:19   DG Knee Complete 4 Views Left  Result Date: 05/21/2022 CLINICAL DATA:  Left knee pain. EXAM: LEFT KNEE - COMPLETE 4+ VIEW COMPARISON:  Left knee radiograph dated 09/27/2007. FINDINGS: Age indeterminate, likely subacute, minimally depressed  fracture of the medial tibial plateau extending from the articular surface to the medial proximal tibial cortex. No acute fracture. The bones are osteopenic. Degenerative changes of the knee with mild joint space narrowing and meniscal chondrocalcinosis. There is a moderate size suprapatellar effusion. The soft tissues are unremarkable. IMPRESSION: 1. Age indeterminate, likely subacute, fracture of the medial tibial plateau. 2. Moderate size suprapatellar effusion. Electronically Signed   By: Anner Crete M.D.   On: 05/21/2022 18:48       Toni Demo M.D. [facility] 05/22/2022, 9:40 AM  Available via Epic secure chat 7am-7pm After 7 pm, please refer to night coverage provider listed on amion.

## 2022-05-22 NOTE — Anesthesia Procedure Notes (Signed)
Procedure Name: Intubation Date/Time: 05/22/2022 10:28 AM  Performed by: Raenette Rover, CRNAPre-anesthesia Checklist: Patient identified, Emergency Drugs available, Suction available and Patient being monitored Patient Re-evaluated:Patient Re-evaluated prior to induction Oxygen Delivery Method: Circle system utilized Preoxygenation: Pre-oxygenation with 100% oxygen Induction Type: IV induction Ventilation: Mask ventilation without difficulty Laryngoscope Size: Mac and 3 Grade View: Grade I Tube type: Oral Tube size: 7.0 mm Number of attempts: 1 Airway Equipment and Method: Stylet Placement Confirmation: ETT inserted through vocal cords under direct vision, positive ETCO2 and breath sounds checked- equal and bilateral Secured at: 22 cm Tube secured with: Tape Dental Injury: Teeth and Oropharynx as per pre-operative assessment

## 2022-05-22 NOTE — Progress Notes (Addendum)
Orthopedic Surgery Progress Note   Assessment: Patient is a 71 y.o. female with left tibial plateau fracture   Plan: -Operative plans: complete -Diet: NPO for procedure -DVT ppx: per primary -Antibiotics: ancef on call to OR -TXA on call to OR -Weight bearing status: NWB LLE in KI -PT/OT evaluate and treat post-operatively -Pain control -Dispo: pending completion of operative plans  I went over the risks, benefits and alternatives of surgery with Ms. Plamer again this morning. The risks discussed included but were not limited to infection, need for additional procedures, stiffness, post-traumatic arthritis, hardware failure, nonunion, malunion, neurovascular injury, death.   ___________________________________________________________________________  Subjective: No acute events overnight. Still having left knee pain. Pain medications are helping. No pain besides in left knee. Denies paresthesias and numbness.    Physical Exam:  General: no acute distress, appears stated age Neurologic: alert, answering questions appropriately, following commands Respiratory: unlabored breathing on room air, symmetric chest rise  MSK:   -Left lower extremity  No tenderness to palpation over extremity, except over the knee and proximal tibia Refuses to fire hip flexors, quadriceps, hamstrings due to pain. Fires tibialis anterior, gastrocnemius and soleus, extensor hallucis longus Plantarflexes and dorsiflexes toes Sensation intact to light touch in sural, saphenous, tibial, deep peroneal, and superficial peroneal nerve distributions Foot warm and well perfused, palpable DP pulse No increased pain with passive motion at the ankle, compartments soft and compressible   Patient name: Caitlin Irwin Patient MRN: [id number] Date: 05/22/22

## 2022-05-22 NOTE — Anesthesia Preprocedure Evaluation (Addendum)
Anesthesia Evaluation  Patient identified by MRN, date of birth, ID band Patient awake    Reviewed: Allergy & Precautions, NPO status , Patient's Chart, lab work & pertinent test results  History of Anesthesia Complications (+) PONV and history of anesthetic complications  Airway Mallampati: II       Dental   Pulmonary    breath sounds clear to auscultation       Cardiovascular negative cardio ROS   Rhythm:Regular Rate:Normal     Neuro/Psych  Headaches, PSYCHIATRIC DISORDERS  Neuromuscular disease    GI/Hepatic hiatal hernia, PUD, GERD  ,  Endo/Other  Hypothyroidism   Renal/GU      Musculoskeletal  (+) Arthritis ,   Abdominal   Peds  Hematology   Anesthesia Other Findings   Reproductive/Obstetrics                             Anesthesia Physical Anesthesia Plan  ASA: 3  Anesthesia Plan: General   Post-op Pain Management:    Induction:   PONV Risk Score and Plan: 4 or greater and Ondansetron, Dexamethasone and Midazolam  Airway Management Planned: Oral ETT  Additional Equipment:   Intra-op Plan:   Post-operative Plan: Extubation in OR  Informed Consent: I have reviewed the patients History and Physical, chart, labs and discussed the procedure including the risks, benefits and alternatives for the proposed anesthesia with the patient or authorized representative who has indicated his/her understanding and acceptance.     Dental advisory given  Plan Discussed with: CRNA and Anesthesiologist  Anesthesia Plan Comments:         Anesthesia Quick Evaluation

## 2022-05-22 NOTE — Transfer of Care (Signed)
Immediate Anesthesia Transfer of Care Note  Patient: Caitlin Irwin  Procedure(s) Performed: OPEN REDUCTION INTERNAL FIXATION (ORIF) TIBIAL PLATEAU (Left: Knee)  Patient Location: PACU  Anesthesia Type:General  Level of Consciousness: drowsy and patient cooperative  Airway & Oxygen Therapy: Patient Spontanous Breathing and Patient connected to face mask oxygen  Post-op Assessment: Report given to RN and Post -op Vital signs reviewed and stable  Post vital signs: Reviewed and stable  Last Vitals:  Vitals Value Taken Time  BP 152/78 05/22/22 1500  Temp    Pulse 80 05/22/22 1500  Resp 10 05/22/22 1500  SpO2 96 % 05/22/22 1500  Vitals shown include unvalidated device data.  Last Pain:  Vitals:   05/22/22 0858  TempSrc:   PainSc: 5          Complications: No notable events documented.

## 2022-05-23 ENCOUNTER — Encounter (HOSPITAL_COMMUNITY): Payer: Self-pay | Admitting: Orthopedic Surgery

## 2022-05-23 DIAGNOSIS — K2990 Gastroduodenitis, unspecified, without bleeding: Secondary | ICD-10-CM

## 2022-05-23 DIAGNOSIS — K219 Gastro-esophageal reflux disease without esophagitis: Secondary | ICD-10-CM | POA: Diagnosis not present

## 2022-05-23 DIAGNOSIS — S82132A Displaced fracture of medial condyle of left tibia, initial encounter for closed fracture: Secondary | ICD-10-CM | POA: Diagnosis not present

## 2022-05-23 DIAGNOSIS — K2970 Gastritis, unspecified, without bleeding: Secondary | ICD-10-CM

## 2022-05-23 DIAGNOSIS — S82142A Displaced bicondylar fracture of left tibia, initial encounter for closed fracture: Secondary | ICD-10-CM | POA: Diagnosis not present

## 2022-05-23 DIAGNOSIS — Z8711 Personal history of peptic ulcer disease: Secondary | ICD-10-CM

## 2022-05-23 DIAGNOSIS — F32A Depression, unspecified: Secondary | ICD-10-CM | POA: Diagnosis not present

## 2022-05-23 LAB — CBC
HCT: 27.2 % — ABNORMAL LOW (ref 36.0–46.0)
Hemoglobin: 8.9 g/dL — ABNORMAL LOW (ref 12.0–15.0)
MCH: 29.4 pg (ref 26.0–34.0)
MCHC: 32.7 g/dL (ref 30.0–36.0)
MCV: 89.8 fL (ref 80.0–100.0)
Platelets: 211 10*3/uL (ref 150–400)
RBC: 3.03 MIL/uL — ABNORMAL LOW (ref 3.87–5.11)
RDW: 13 % (ref 11.5–15.5)
WBC: 8.1 10*3/uL (ref 4.0–10.5)
nRBC: 0 % (ref 0.0–0.2)

## 2022-05-23 LAB — IRON AND TIBC
Iron: 30 ug/dL (ref 28–170)
Saturation Ratios: 11 % (ref 10.4–31.8)
TIBC: 282 ug/dL (ref 250–450)
UIBC: 252 ug/dL

## 2022-05-23 LAB — URINALYSIS, ROUTINE W REFLEX MICROSCOPIC
Bilirubin Urine: NEGATIVE
Glucose, UA: NEGATIVE mg/dL
Ketones, ur: NEGATIVE mg/dL
Leukocytes,Ua: NEGATIVE
Nitrite: NEGATIVE
Protein, ur: NEGATIVE mg/dL
Specific Gravity, Urine: 1.008 (ref 1.005–1.030)
pH: 7 (ref 5.0–8.0)

## 2022-05-23 LAB — BASIC METABOLIC PANEL
Anion gap: 7 (ref 5–15)
BUN: 9 mg/dL (ref 8–23)
CO2: 23 mmol/L (ref 22–32)
Calcium: 9.5 mg/dL (ref 8.9–10.3)
Chloride: 109 mmol/L (ref 98–111)
Creatinine, Ser: 0.79 mg/dL (ref 0.44–1.00)
GFR, Estimated: >60 mL/min (ref >60)
Glucose, Bld: 107 mg/dL — ABNORMAL HIGH (ref 70–99)
Potassium: 3 mmol/L — ABNORMAL LOW (ref 3.5–5.1)
Sodium: 139 mmol/L (ref 135–145)

## 2022-05-23 LAB — CORTISOL: Cortisol, Plasma: 6.9 ug/dL

## 2022-05-23 LAB — RETICULOCYTES
Immature Retic Fract: 10.6 % (ref 2.3–15.9)
RBC.: 3.05 MIL/uL — ABNORMAL LOW (ref 3.87–5.11)
Retic Count, Absolute: 53.7 10*3/uL (ref 19.0–186.0)
Retic Ct Pct: 1.8 % (ref 0.4–3.1)

## 2022-05-23 LAB — FOLATE: Folate: 4.6 ng/mL — ABNORMAL LOW (ref >5.9)

## 2022-05-23 LAB — FERRITIN: Ferritin: 31 ng/mL (ref 11–307)

## 2022-05-23 LAB — VITAMIN B12: Vitamin B-12: 287 pg/mL (ref 180–914)

## 2022-05-23 LAB — T4, FREE: Free T4: 1.12 ng/dL (ref 0.61–1.12)

## 2022-05-23 LAB — TSH: TSH: 4.504 u[IU]/mL — ABNORMAL HIGH (ref 0.350–4.500)

## 2022-05-23 MED ORDER — POTASSIUM CHLORIDE CRYS ER 20 MEQ PO TBCR
40.0000 meq | EXTENDED_RELEASE_TABLET | Freq: Once | ORAL | Status: AC
  Administered 2022-05-23: 40 meq via ORAL
  Filled 2022-05-23: qty 2

## 2022-05-23 MED ORDER — TAMSULOSIN HCL 0.4 MG PO CAPS
0.4000 mg | ORAL_CAPSULE | Freq: Every day | ORAL | Status: DC
  Administered 2022-05-23 – 2022-05-28 (×6): 0.4 mg via ORAL
  Filled 2022-05-23 (×6): qty 1

## 2022-05-23 NOTE — Progress Notes (Addendum)
[facility]                                                                              Caitlin Irwin, is a 71 y.o. female, DOB - 02/23/1951, PPI:[id number] Admit date - 05/21/2022    Outpatient Primary MD for the patient is Jacalyn Lefevre, [name], MD  LOS - 1  days  Chief Complaint  Patient presents with   Loss of Consciousness   Leg Pain   Fall       Brief summary   Patient is a 71 year old female with history of migraines, depression, anxiety, hypothyroidism, IBS, GERD, chronic anemia, chronic pain presented after a fall at home.  Patient was walking downstairs and became dizzy, lightheaded and fell down the stairs and landed on her left knee.  Denied any loss of consciousness.  Did not hit her head.  Per patient, had been eating and drinking a little bit less recently for no particular reason. In ED, BP systolic in 063K, potassium 2.5, creatinine 0.8.  WBCs 12.2. Left knee x-ray showed age indeterminate tibial plateau fracture on the medial side with moderate suprapatellar effusion. CT left knee showed acute mildly depressed medial tibial plateau fracture with large suprapatellar effusion Orthopedics was consulted.  Assessment & Plan    Principal Problem:   Tibial plateau fracture, left, closed, with suprapatellar effusion -Status post ORIF left bicondylar tibial plateau fracture with medial and lateral plating, postop day #1 -Orthopedics following, recommended aspirin 325 mg twice daily for DVT prophylaxis - continue pain control and bowel regimen -Start PT OT today -Lives at home alone, so likely will need STR  Active problems Anemia, normocytic, acute postoperative blood loss -Baseline hemoglobin around 12 -Hemoglobin 8.9 today, likely postop, follow closely -Transfuse if less than 8 -Follow anemia panel  Hypokalemia -K 3.0, replaced K-Dur 40 meq p.o. x1   Near syncope, dizziness -No syncopal episode. Possibly due to profound hypokalemia -No chest  pain, SOB palpitations or cardiac symptoms. Per EDP,  EKG with rate 85, LAFB, no acute ST-T wave changes (not uploaded on epic).   -Troponins negative 6 - 5 -Follow B12, folate, cortisol level, TSH 4.5  Essential hypertension -BP stable, continue Aldactone     Hypothyroidism -Continue Synthroid -TSH 4.5, follow free T4     History of peptic ulcer, Gastritis and gastroduodenitis/ GERD -Continue PPI    Hx of migraine headaches -Currently stable, continue Topamax, sumatriptan    Depression -Continue fluoxetine, Xanax    Trigeminal neuralgia of left side of face -No acute complaints, continue gabapentin   Code Status: Full code DVT Prophylaxis:  SCDs Start: 05/21/22 2201   Level of Care: Level of care: Telemetry Family Communication: Updated patient   Disposition Plan:      Remains inpatient appropriate: PT pending, will likely need STR, lives alone   Procedures:  Open reduction internal fixation left bicondylar tibial plateau fracture with medial and lateral plating  Consultants:   Orthopedics  Antimicrobials:   Anti-infectives (From admission, onward)    Start     Dose/Rate Route Frequency Ordered Stop   05/22/22 1700  ceFAZolin (ANCEF) IVPB 2g/100 mL premix  2 g 200 mL/hr over 30 Minutes Intravenous Every 8 hours 05/22/22 1609 05/23/22 0902   05/22/22 1000  ceFAZolin (ANCEF) IVPB 2g/100 mL premix        2 g 200 mL/hr over 30 Minutes Intravenous On call to O.R. 05/22/22 0707 05/22/22 1030          Medications  aspirin  325 mg Oral BID   docusate sodium  100 mg Oral BID   feeding supplement  237 mL Oral BID BM   FLUoxetine  40 mg Oral Daily   gabapentin  100 mg Oral TID   levothyroxine  75 mcg Oral Q0600   sodium chloride flush  3 mL Intravenous Q12H   spironolactone  25 mg Oral Daily   topiramate  50 mg Oral BID      Subjective:   Caitlin Irwin was seen and examined today.  Feeling better today.  No acute complaints.  Pain in the left  knee, 5/10, no paresthesias or numbness.  No acute nausea vomiting, dizziness, chest pain or shortness of breath.    Objective:   Vitals:   05/22/22 1613 05/22/22 2101 05/22/22 2327 05/23/22 0516  BP: (!) 141/58 (!) 118/53 (!) 151/72 (!) 137/52  Pulse: 76 93 88 90  Resp: '14 20 16 20  '$ Temp: 98 F (36.7 C) 98.3 F (36.8 C) 98.5 F (36.9 C) 98 F (36.7 C)  TempSrc: Oral Oral Oral Oral  SpO2: 100% 98% 98% 97%  Weight:      Height:        Intake/Output Summary (Last 24 hours) at 05/23/2022 1009 Last data filed at 05/23/2022 0903 Gross per 24 hour  Intake 3357.65 ml  Output 2650 ml  Net 707.65 ml     Wt Readings from Last 3 Encounters:  05/22/22 53.8 kg  03/25/22 49.9 kg  06/26/21 54.7 kg   Physical Exam General: Alert and oriented x 3, NAD Cardiovascular: S1 S2 clear, RRR.  Respiratory: CTAB, no wheezing Gastrointestinal: Soft, nontender, nondistended, NBS Ext: left lower extremity in immobilizer Neuro: no new deficits Psych: Normal affect       Data Reviewed:  I have personally reviewed following labs    CBC Lab Results  Component Value Date   WBC 8.1 05/23/2022   RBC 3.03 (L) 05/23/2022   HGB 8.9 (L) 05/23/2022   HCT 27.2 (L) 05/23/2022   MCV 89.8 05/23/2022   MCH 29.4 05/23/2022   PLT 211 05/23/2022   MCHC 32.7 05/23/2022   RDW 13.0 05/23/2022   LYMPHSABS 0.9 05/21/2022   MONOABS 0.7 05/21/2022   EOSABS 0.0 05/21/2022   BASOSABS 0.0 61/95/0932     Last metabolic panel Lab Results  Component Value Date   NA 139 05/23/2022   K 3.0 (L) 05/23/2022   CL 109 05/23/2022   CO2 23 05/23/2022   BUN 9 05/23/2022   CREATININE 0.79 05/23/2022   GLUCOSE 107 (H) 05/23/2022   GFRNONAA >60 05/23/2022   GFRAA >60 01/09/2019   CALCIUM 9.5 05/23/2022   PHOS 2.3 11/27/2009   PROT 5.6 (L) 05/22/2022   ALBUMIN 3.3 (L) 05/22/2022   BILITOT 0.5 05/22/2022   ALKPHOS 76 05/22/2022   AST 34 05/22/2022   ALT 23 05/22/2022   ANIONGAP 7 05/23/2022    CBG  (last 3)  No results for input(s): "GLUCAP" in the last 72 hours.    Coagulation Profile: No results for input(s): "INR", "PROTIME" in the last 168 hours.   Radiology Studies: I have personally reviewed the  imaging studies  DG Knee 1-2 Views Left  Result Date: 05/22/2022 CLINICAL DATA:  Postop. EXAM: LEFT KNEE - 1-2 VIEW COMPARISON:  Plain film of the tibia 05/21/2022. FINDINGS: Interval placement of plate and screw fixation hardware across the proximal tibial fracture site. Hardware appears intact and appropriately positioned. Osseous alignment is anatomic. Expected postsurgical changes are seen within the overlying soft tissues. IMPRESSION: Interval placement of plate and screw fixation hardware across the proximal tibial fracture site. No evidence of surgical complicating feature. Electronically Signed   By: Franki Cabot M.D.   On: 05/22/2022 15:07   DG Knee 1-2 Views Left  Result Date: 05/22/2022 CLINICAL DATA:  Tibial plateau fracture EXAM: LEFT KNEE - 1-2 VIEW COMPARISON:  the previous day's study FINDINGS: 2 intraoperative fluoroscopic spot images document plate and screw fixation of tibial plateau fracture fragments in near anatomic alignment. IMPRESSION: ORIF of tibial plateau fracture. Electronically Signed   By: Lucrezia Europe M.D.   On: 05/22/2022 14:15   DG C-Arm 1-60 Min-No Report  Result Date: 05/22/2022 Fluoroscopy was utilized by the requesting physician.  No radiographic interpretation.   DG C-Arm 1-60 Min-No Report  Result Date: 05/22/2022 Fluoroscopy was utilized by the requesting physician.  No radiographic interpretation.   DG C-Arm 1-60 Min-No Report  Result Date: 05/22/2022 Fluoroscopy was utilized by the requesting physician.  No radiographic interpretation.   DG C-Arm 1-60 Min-No Report  Result Date: 05/22/2022 Fluoroscopy was utilized by the requesting physician.  No radiographic interpretation.   DG Tibia/Fibula Left  Result Date: 05/21/2022 CLINICAL  DATA:  Closed tibia fracture. Preoperative for surgery tomorrow. EXAM: LEFT TIBIA AND FIBULA - 2 VIEW COMPARISON:  Left knee radiographs and CT 05/21/2022 FINDINGS: Oblique fracture of the left medial tibial plateau with mild depression again demonstrated. No change in appearance since the previous study. No additional acute fractures are demonstrated in the fibula or mid/distal tibia. Postoperative changes with plate and screw fixation of the distal left fibula and screw fixation of the medial malleolus of the distal tibia. Moderate left knee effusion is present. IMPRESSION: Mildly depressed medial left tibial plateau fracture again demonstrated. Moderate left knee effusion. Electronically Signed   By: Lucienne Capers M.D.   On: 05/21/2022 23:54   CT Knee Left Wo Contrast  Result Date: 05/21/2022 CLINICAL DATA:  Tibial plateau fracture after fall. EXAM: CT OF THE LEFT KNEE WITHOUT CONTRAST TECHNIQUE: Multidetector CT imaging of the left knee was performed according to the standard protocol. Multiplanar CT image reconstructions were also generated. RADIATION DOSE REDUCTION: This exam was performed according to the departmental dose-optimization program which includes automated exposure control, adjustment of the mA and/or kV according to patient size and/or use of iterative reconstruction technique. COMPARISON:  Radiograph of same day. FINDINGS: Acute mildly depressed fracture is seen involving the medial tibial plateau. Distal femur is unremarkable. Visualized proximal fibula is unremarkable. Large suprapatellar joint effusion is noted. IMPRESSION: Acute mildly depressed medial tibial plateau fracture. Large suprapatellar joint effusion. Electronically Signed   By: Marijo Conception M.D.   On: 05/21/2022 20:19   DG Knee Complete 4 Views Left  Result Date: 05/21/2022 CLINICAL DATA:  Left knee pain. EXAM: LEFT KNEE - COMPLETE 4+ VIEW COMPARISON:  Left knee radiograph dated 09/27/2007. FINDINGS: Age  indeterminate, likely subacute, minimally depressed fracture of the medial tibial plateau extending from the articular surface to the medial proximal tibial cortex. No acute fracture. The bones are osteopenic. Degenerative changes of the knee with mild joint space  narrowing and meniscal chondrocalcinosis. There is a moderate size suprapatellar effusion. The soft tissues are unremarkable. IMPRESSION: 1. Age indeterminate, likely subacute, fracture of the medial tibial plateau. 2. Moderate size suprapatellar effusion. Electronically Signed   By: Anner Crete M.D.   On: 05/21/2022 18:48       Ojani Berenson M.D. [facility] 05/23/2022, 10:09 AM  Available via Epic secure chat 7am-7pm After 7 pm, please refer to night coverage provider listed on amion.

## 2022-05-23 NOTE — Evaluation (Signed)
Physical Therapy Evaluation Patient Details Name: Caitlin Irwin MRN: [id number] DOB: May 05, 1951 Today's Date: 05/23/2022  History of Present Illness  71 y.o. female admitted after fall at home with left tibial plateau fracture status post ORIF on 05/22/22.  PMH OA, ataxia, LBP, HLD, IBS, trigeminal neuralgia s/p resection, ankle and LE fractures and surgeries, rotator cuff repair, memory loss, migraines  Clinical Impression  Pt admitted with above diagnosis.  Pt currently with functional limitations due to the deficits listed below (see PT Problem List). Pt will benefit from skilled PT to increase their independence and safety with mobility to allow discharge to the venue listed below.  Pt premedicated and still reports 6/10 pain in left knee during mobility.  Pt educated on KI at all times and NWB status.  Pt reports her neighbor "are good" and could assist her upon d/c however pt would benefit from rehab at SNF prior to return home alone.  Pt also reports migraine today (frequent at baseline per pt) and taking meds at home for this (reported to RN).        Recommendations for follow up therapy are one component of a multi-disciplinary discharge planning process, led by the attending physician.  Recommendations may be updated based on patient status, additional functional criteria and insurance authorization.  Follow Up Recommendations Skilled nursing-short term rehab (<3 hours/day) Can patient physically be transported by private vehicle: No    Assistance Recommended at Discharge Frequent or constant Supervision/Assistance  Patient can return home with the following  A lot of help with walking and/or transfers;A little help with bathing/dressing/bathroom;Assistance with cooking/housework;Help with stairs or ramp for entrance;Assist for transportation    Equipment Recommendations None recommended by PT  Recommendations for Other Services       Functional Status Assessment Patient has  had a recent decline in their functional status and demonstrates the ability to make significant improvements in function in a reasonable and predictable amount of time.     Precautions / Restrictions Precautions Precautions: Fall Required Braces or Orthoses: Knee Immobilizer - Left Knee Immobilizer - Left: On at all times Restrictions Weight Bearing Restrictions: Yes LLE Weight Bearing: Non weight bearing      Mobility  Bed Mobility Overal bed mobility: Needs Assistance Bed Mobility: Supine to Sit     Supine to sit: Min assist     General bed mobility comments: increased time, cues for self assist, required assist/support for Lt LE    Transfers Overall transfer level: Needs assistance Equipment used: Rolling walker (2 wheels) Transfers: Sit to/from Stand, Bed to chair/wheelchair/BSC Sit to Stand: Min assist   Step pivot transfers: Min assist       General transfer comment: multimodal cues for technique; assist to rise and steady; pt able to take a couple hops forwards however reports increased pain so pivoted on Rt foot to bring recliner to pt; pt reports dizziness throughout (obtained BP, also pt received IV pain meds just prior to session)    Ambulation/Gait                  Stairs            Wheelchair Mobility    Modified Rankin (Stroke Patients Only)       Balance Overall balance assessment: Needs assistance         Standing balance support: Bilateral upper extremity supported, Reliant on assistive device for balance Standing balance-Leahy Scale: Poor  Pertinent Vitals/Pain Pain Assessment Pain Assessment: 0-10 Pain Score: 6  Pain Location: left knee Pain Descriptors / Indicators: Sore, Discomfort Pain Intervention(s): Repositioned, Monitored during session, Premedicated before session    Home Living Family/patient expects to be discharged to:: Private residence Living Arrangements:  Alone Available Help at Discharge: Available 24 hours/day;Neighbor Type of Home: House Home Access: Stairs to enter Entrance Stairs-Rails: Right;Left;Can reach both Entrance Stairs-Number of Steps: 4   Home Layout: Two level;Able to live on main level with bedroom/bathroom Home Equipment: Rolling Walker (2 wheels);Wheelchair - manual Additional Comments: reports neighbor can help    Prior Function Prior Level of Function : Independent/Modified Independent                     Hand Dominance        Extremity/Trunk Assessment   Upper Extremity Assessment Upper Extremity Assessment: Generalized weakness    Lower Extremity Assessment Lower Extremity Assessment: LLE deficits/detail LLE Deficits / Details: KI at all times, able to move ankles       Communication   Communication: No difficulties  Cognition Arousal/Alertness: Awake/alert Behavior During Therapy: WFL for tasks assessed/performed Overall Cognitive Status: Within Functional Limits for tasks assessed                                          General Comments General comments (skin integrity, edema, etc.): Per notes, "Patient was walking downstairs and became dizzy, lightheaded and fell down the stairs and landed on her left knee." However, pt reports she fell down attempting to lift a bag of dog food to this therapist.    Exercises     Assessment/Plan    PT Assessment Patient needs continued PT services  PT Problem List Decreased strength;Decreased activity tolerance;Decreased balance;Decreased mobility;Decreased knowledge of precautions;Decreased knowledge of use of DME;Pain       PT Treatment Interventions Gait training;DME instruction;Therapeutic exercise;Balance training;Functional mobility training;Therapeutic activities;Patient/family education;Wheelchair mobility training    PT Goals (Current goals can be found in the Care Plan section)  Acute Rehab PT Goals PT Goal  Formulation: With patient Time For Goal Achievement: 06/06/22 Potential to Achieve Goals: Good    Frequency Min 3X/week     Co-evaluation               AM-PAC PT "6 Clicks" Mobility  Outcome Measure Help needed turning from your back to your side while in a flat bed without using bedrails?: A Little Help needed moving from lying on your back to sitting on the side of a flat bed without using bedrails?: A Little Help needed moving to and from a bed to a chair (including a wheelchair)?: A Lot Help needed standing up from a chair using your arms (e.g., wheelchair or bedside chair)?: A Lot Help needed to walk in hospital room?: Total Help needed climbing 3-5 steps with a railing? : Total 6 Click Score: 12    End of Session Equipment Utilized During Treatment: Gait belt;Left knee immobilizer Activity Tolerance: Patient limited by pain;Patient limited by fatigue Patient left: in chair;with call bell/phone within reach;with chair alarm set Nurse Communication: Mobility status PT Visit Diagnosis: Difficulty in walking, not elsewhere classified (R26.2);Unsteadiness on feet (R26.81);Pain Pain - Right/Left: Left Pain - part of body: Knee    Time: 4540-9811 PT Time Calculation (min) (ACUTE ONLY): 20 min   Charges:   PT Evaluation $PT Eval  Low Complexity: 1 Low     Kati PT, DPT Physical Therapist Acute Rehabilitation Services Preferred contact method: Secure Chat Weekend Pager Only: [phone] Office: [place] 05/23/2022, 3:00 PM

## 2022-05-23 NOTE — Progress Notes (Addendum)
Orthopedic Surgery Progress Note   Assessment: Patient is a 71 y.o. female with left tibial plateau fracture status post ORIF   Plan: -Operative plans: complete -Okay for diet from orthopedic perspective -DVT ppx: aspirin '325mg'$  BID -Antibiotics: ancef x2 post-op doses -Weight bearing status: non-weight bearing left lower extremity with knee immobilizer on at all times -PT/OT evaluate and treat -Pain control -Dispo: pending clinical course and PT/OT  I showed her her post-operative x-rays this morning. I explained to her that she had a second fracture which I found intra-operatively. I told her that is why a second plate was used. She expressed understanding and all her questions were answered to her satisfaction.   ___________________________________________________________________________  Subjective: No acute events overnight. Having pain around left knee. No pain elsewhere. Pain medications are giving her adequate relief. Denies paresthesias and numbness.    Physical Exam:  General: no acute distress, appears stated age Neurologic: alert, answering questions appropriately, following commands Respiratory: unlabored breathing on room air, symmetric chest rise Psychiatric: appropriate affect, normal cadence to speech  MSK:   -Left lower extremity  No tenderness to palpation over extremity, except around the knee  Dressings c/d/i, knee immobilizer in place  Fires hip flexors, quadriceps, hamstrings, tibialis anterior, gastrocnemius and soleus, extensor hallucis longus Plantarflexes and dorsiflexes toes Sensation intact to light touch in sural, saphenous, tibial, deep peroneal, and superficial peroneal nerve distributions Foot warm and well perfused   Patient name: Caitlin Irwin Patient MRN: [id number] Date: 05/23/22

## 2022-05-23 NOTE — Plan of Care (Signed)
  Problem: Education: Goal: Knowledge of General Education information will improve Description Including pain rating scale, medication(s)/side effects and non-pharmacologic comfort measures Outcome: Progressing   Problem: Health Behavior/Discharge Planning: Goal: Ability to manage health-related needs will improve Outcome: Progressing   

## 2022-05-23 NOTE — Progress Notes (Signed)
Pt had difficult time to void. RN scanned bladder and it resulted in > 718m. Notified attending on-call and received for in and out cath. RN tried to in& out cath twice w/ charge nurse. Pt started screamed and said that didn't want it. Notified attending on- call again and received indwelling catheter. Xanax given to relax first. Tried to put in 14 fr foley, but she refused it. RN talked to pt about situation and why pt needs it, but pt still refuses foley catheter. Educated pt about possible infection of urinary rentention.

## 2022-05-24 DIAGNOSIS — S82142A Displaced bicondylar fracture of left tibia, initial encounter for closed fracture: Secondary | ICD-10-CM | POA: Diagnosis not present

## 2022-05-24 LAB — CBC
HCT: 25.8 % — ABNORMAL LOW (ref 36.0–46.0)
Hemoglobin: 8.3 g/dL — ABNORMAL LOW (ref 12.0–15.0)
MCH: 29.4 pg (ref 26.0–34.0)
MCHC: 32.2 g/dL (ref 30.0–36.0)
MCV: 91.5 fL (ref 80.0–100.0)
Platelets: 169 10*3/uL (ref 150–400)
RBC: 2.82 MIL/uL — ABNORMAL LOW (ref 3.87–5.11)
RDW: 13.2 % (ref 11.5–15.5)
WBC: 7.2 10*3/uL (ref 4.0–10.5)
nRBC: 0 % (ref 0.0–0.2)

## 2022-05-24 LAB — BASIC METABOLIC PANEL
Anion gap: 7 (ref 5–15)
BUN: 8 mg/dL (ref 8–23)
CO2: 22 mmol/L (ref 22–32)
Calcium: 9.3 mg/dL (ref 8.9–10.3)
Chloride: 107 mmol/L (ref 98–111)
Creatinine, Ser: 0.76 mg/dL (ref 0.44–1.00)
GFR, Estimated: >60 mL/min (ref >60)
Glucose, Bld: 97 mg/dL (ref 70–99)
Potassium: 3 mmol/L — ABNORMAL LOW (ref 3.5–5.1)
Sodium: 136 mmol/L (ref 135–145)

## 2022-05-24 MED ORDER — ONDANSETRON HCL 4 MG/2ML IJ SOLN: 4.0000 mg | Freq: Four times a day (QID) | INTRAMUSCULAR | Status: DC | PRN

## 2022-05-24 MED ORDER — POTASSIUM CHLORIDE CRYS ER 20 MEQ PO TBCR
40.0000 meq | EXTENDED_RELEASE_TABLET | Freq: Once | ORAL | Status: AC
  Administered 2022-05-24: 40 meq via ORAL
  Filled 2022-05-24: qty 2

## 2022-05-24 MED ORDER — PANTOPRAZOLE SODIUM 40 MG PO TBEC
40.0000 mg | DELAYED_RELEASE_TABLET | Freq: Every day | ORAL | Status: DC
  Administered 2022-05-24 – 2022-05-28 (×5): 40 mg via ORAL
  Filled 2022-05-24 (×5): qty 1

## 2022-05-24 MED ORDER — CYANOCOBALAMIN 500 MCG PO TABS
500.0000 ug | ORAL_TABLET | Freq: Every day | ORAL | Status: DC
  Administered 2022-05-24 – 2022-05-25 (×2): 500 ug via ORAL
  Filled 2022-05-24 (×2): qty 1

## 2022-05-24 MED ORDER — KETOROLAC TROMETHAMINE 15 MG/ML IJ SOLN
15.0000 mg | Freq: Four times a day (QID) | INTRAMUSCULAR | Status: DC
  Administered 2022-05-24 – 2022-05-28 (×15): 15 mg via INTRAVENOUS
  Filled 2022-05-24 (×16): qty 1

## 2022-05-24 MED ORDER — FOLIC ACID 1 MG PO TABS
1.0000 mg | ORAL_TABLET | Freq: Every day | ORAL | Status: DC
  Administered 2022-05-24 – 2022-05-28 (×5): 1 mg via ORAL
  Filled 2022-05-24 (×5): qty 1

## 2022-05-24 MED ORDER — METHOCARBAMOL 1000 MG/10ML IJ SOLN: 500.0000 mg | Freq: Four times a day (QID) | INTRAVENOUS | Status: DC | PRN

## 2022-05-24 NOTE — NC FL2 (Addendum)
[place] LEVEL OF CARE SCREENING TOOL     IDENTIFICATION  Patient Name: Caitlin Irwin Birthdate: [birth year] Sex: female Admission Date (Current Location): 05/21/2022  [facility] and [place] Number:  Herbalist and Address:  [facility],  [place], [place]      Provider Number: [id number]  Attending Physician Name and Address:  Mendel Corning, MD  Relative Name and Phone Number:   Hubbard Robinson [phone])    Current Level of Care: Hospital Recommended Level of Care: [place] Prior Approval Number:    Date Approved/Denied:   PASRR Number:  [id number])  Discharge Plan: SNF    Current Diagnoses: Patient Active Problem List   Diagnosis Date Noted   Tibial plateau fracture, left, closed, initial encounter 05/21/2022   Closed displaced fracture of medial condyle of left tibia    Bilateral leg weakness 06/26/2021   Dizziness 06/26/2021   Pre-syncope 12/24/2020   Slurred speech    Transient neurologic deficit 12/22/2020   Anxiety    Orthostasis    Closed fracture of proximal end of right tibia and fibula 11/13/2018   Bilateral temporomandibular joint pain 10/21/2015   Ulceration, tongue traumatic 10/21/2015   Epigastric hernia 09/15/2015   Headache, migraine    Insomnia    Emesis    Microcytic hypochromic anemia    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/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   Migraine headache 08/28/2014   Aspirin toxicity    Ataxia 48/08/6551   Complicated grief 74/82/7078   Polypharmacy 10/13/2013   Depression 10/13/2013   Esophageal reflux 06/12/2012   Trigeminal neuralgia of left side of face 11/07/2011   Hx of migraine headaches 10/20/2011   Dysphagia, unspecified(787.20) 09/26/2011   ULCER-DUODENAL 11/26/2008   History of peptic ulcer 11/26/2008   Gastritis and gastroduodenitis 11/26/2008   Hypothyroidism 10/03/2007   CONSTIPATION, CHRONIC 10/03/2007   Irritable bowel syndrome 10/03/2007    COLITIS, HX OF 10/03/2007    Orientation RESPIRATION BLADDER Height & Weight     Self, Time, Situation, Place  Normal Continent Weight: 53.8 kg Height:  4' 9.99" (147.3 cm)  BEHAVIORAL SYMPTOMS/MOOD NEUROLOGICAL BOWEL NUTRITION STATUS      Continent Diet (Regular)  AMBULATORY STATUS COMMUNICATION OF NEEDS Skin   Limited Assist Verbally Normal                       Personal Care Assistance Level of Assistance  Bathing, Feeding, Dressing Bathing Assistance: Limited assistance Feeding assistance: Limited assistance       Functional Limitations Info  Hearing, Sight, Speech Sight Info: Adequate Hearing Info: Adequate Speech Info: Adequate    SPECIAL CARE FACTORS FREQUENCY  PT (By licensed PT), OT (By licensed OT)     PT Frequency:  (5x week) OT Frequency:  (5x week)            Contractures Contractures Info: Not present    Additional Factors Info  Code Status, Allergies Code Status Info:  (Full) Allergies Info:  (acetaminophen)           Discharge Medications: STOP taking these medications     estradiol 1 MG tablet Commonly known as: ESTRACE    meclizine 25 MG tablet Commonly known as: ANTIVERT           TAKE these medications     ALPRAZolam 0.5 MG tablet Commonly known as: XANAX Take 1 tablet (0.5 mg total) by mouth at bedtime as needed for anxiety.    aspirin  EC 325 MG tablet Take 1 tablet (325 mg total) by mouth in the morning and at bedtime for 20 days.    cyanocobalamin 1000 MCG tablet Commonly known as: VITAMIN B12 Take 1 tablet (1,000 mcg total) by mouth daily.    FLUoxetine 10 MG capsule Commonly known as: PROZAC Take 10 mg by mouth daily.    folic acid 1 MG tablet Commonly known as: FOLVITE Take 1 tablet (1 mg total) by mouth daily.    gabapentin 100 MG capsule Commonly known as: NEURONTIN Take 100 mg by mouth 3 (three) times daily. What changed: Another medication with the same name was removed. Continue taking this  medication, and follow the directions you see here.    levothyroxine 75 MCG tablet Commonly known as: SYNTHROID Take 75 mcg by mouth daily before breakfast.    multivitamin with minerals Tabs tablet Take 1 tablet by mouth daily.    oxyCODONE 5 MG immediate release tablet Commonly known as: Roxicodone Take 1-2 tablets (5-10 mg total) by mouth every 4 (four) hours as needed for up to 7 days for severe pain or moderate pain.    senna 8.6 MG Tabs tablet Commonly known as: SENOKOT Take 1 tablet (8.6 mg total) by mouth in the morning and at bedtime for 20 days.    spironolactone 25 MG tablet Commonly known as: ALDACTONE Take 25 mg by mouth daily.    SUMAtriptan 100 MG tablet Commonly known as: Imitrex Take 1 tablet (100 mg total) by mouth every 2 (two) hours as needed for migraine. May repeat in 2 hours if headache persists or recurs.    topiramate 50 MG tablet Commonly known as: TOPAMAX Take 50 mg by mouth 2 (two) times daily.      Relevant Imaging Results:  Relevant Lab Results:   Additional Information SS# [id number] [place], [place]

## 2022-05-24 NOTE — Progress Notes (Signed)
[facility]                                                                              [name], is a 71 y.o. female, DOB - 1951/06/11, ZOX:[id number] Admit date - 05/21/2022    Outpatient Primary MD for the patient is Jacalyn Lefevre, [name], MD  LOS - 2  days  Chief Complaint  Patient presents with   Loss of Consciousness   Leg Pain   Fall       Brief summary   Patient is a 71 year old female with history of migraines, depression, anxiety, hypothyroidism, IBS, GERD, chronic anemia, chronic pain presented after a fall at home.  Patient was walking downstairs and became dizzy, lightheaded and fell down the stairs and landed on her left knee.  Denied any loss of consciousness.  Did not hit her head.  Per patient, had been eating and drinking a little bit less recently for no particular reason. In ED, BP systolic in 811B, potassium 2.5, creatinine 0.8.  WBCs 12.2. Left knee x-ray showed age indeterminate tibial plateau fracture on the medial side with moderate suprapatellar effusion. CT left knee showed acute mildly depressed medial tibial plateau fracture with large suprapatellar effusion Orthopedics was consulted.  Assessment & Plan    Principal Problem:   Tibial plateau fracture, left, closed, with suprapatellar effusion -Status post ORIF left bicondylar tibial plateau fracture with medial and lateral plating, postop day # 2 -Orthopedics following, recommended aspirin 325 mg twice daily for DVT prophylaxis -PT recommended SNF, TOC consult placed -Complaining of 10/10 pain, will place on scheduled IV Toradol, Robaxin prn, oxycodone p.o. as needed -Place on bowel regimen  Active problems Anemia, normocytic, acute postoperative blood loss -Baseline hemoglobin around 12 -Hemoglobin 8.3, follow closely, will need transfusion if less than 8 tomorrow  Hypokalemia -K 3.0, replaced  Near syncope, dizziness -No syncopal episode. Possibly due to profound  hypokalemia -No chest pain, SOB palpitations or cardiac symptoms. Per EDP,  EKG with rate 85, LAFB, no acute ST-T wave changes (not uploaded on epic).   -Troponins negative 6 - 5 -Folic acid deficiency, 4.6, B12 287, low normal -Cortisol level normal, TSH 4.5   Folic acid deficiency -Placed on folic acid 1 mg daily, -Added vitamin B12 500 mcg daily  Essential hypertension - continue Aldactone, added hydralazine IV as needed with parameters    Hypothyroidism -Continue Synthroid -TSH 4.5, T4 1.1     History of peptic ulcer, Gastritis and gastroduodenitis/ GERD -Continue PPI    Hx of migraine headaches -Currently stable, continue Topamax, sumatriptan    Depression -Continue fluoxetine, Xanax    Trigeminal neuralgia of left side of face -No acute complaints, continue gabapentin   Code Status: Full code DVT Prophylaxis:  SCDs Start: 05/21/22 2201   Level of Care: Level of care: Telemetry Family Communication: Updated patient   Disposition Plan:      Remains inpatient appropriate: PT evaluation recommended SNF Procedures:  Open reduction internal fixation left bicondylar tibial plateau fracture with medial and lateral plating  Consultants:   Orthopedics  Antimicrobials:   Anti-infectives (From admission, onward)    Start  Dose/Rate Route Frequency Ordered Stop   05/22/22 1700  ceFAZolin (ANCEF) IVPB 2g/100 mL premix        2 g 200 mL/hr over 30 Minutes Intravenous Every 8 hours 05/22/22 1609 05/23/22 0902   05/22/22 1000  ceFAZolin (ANCEF) IVPB 2g/100 mL premix        2 g 200 mL/hr over 30 Minutes Intravenous On call to O.R. 05/22/22 0707 05/22/22 1030          Medications  aspirin  325 mg Oral BID   docusate sodium  100 mg Oral BID   feeding supplement  237 mL Oral BID BM   FLUoxetine  40 mg Oral Daily   gabapentin  100 mg Oral TID   levothyroxine  75 mcg Oral Q0600   sodium chloride flush  3 mL Intravenous Q12H   spironolactone  25 mg Oral Daily    tamsulosin  0.4 mg Oral Daily   topiramate  50 mg Oral BID      Subjective:   Caitlin Irwin was seen and examined today.  Complaining of 10/10 pain in the knee with spasms.  No acute nausea vomiting or abdominal pain.   Objective:   Vitals:   05/23/22 1405 05/23/22 1412 05/23/22 2118 05/24/22 0455  BP: (!) 119/55 (!) 124/46 (!) 130/57 (!) 150/62  Pulse:  90 100 93  Resp:   18 18  Temp:   99.2 F (37.3 C) 98.5 F (36.9 C)  TempSrc:   Oral Oral  SpO2:  98% 97% 98%  Weight:      Height:        Intake/Output Summary (Last 24 hours) at 05/24/2022 1313 Last data filed at 05/24/2022 0133 Gross per 24 hour  Intake 750 ml  Output 920 ml  Net -170 ml     Wt Readings from Last 3 Encounters:  05/22/22 53.8 kg  03/25/22 49.9 kg  06/26/21 54.7 kg    Physical Exam General: Alert and oriented x 3, uncomfortable with pain Cardiovascular: S1 S2 clear, RRR.  Respiratory: CTAB, no wheezing Gastrointestinal: Soft, nontender, nondistended, NBS Ext: LLE in nebulizer Neuro: no new deficits Psych: anxious       Data Reviewed:  I have personally reviewed following labs    CBC Lab Results  Component Value Date   WBC 7.2 05/24/2022   RBC 2.82 (L) 05/24/2022   HGB 8.3 (L) 05/24/2022   HCT 25.8 (L) 05/24/2022   MCV 91.5 05/24/2022   MCH 29.4 05/24/2022   PLT 169 05/24/2022   MCHC 32.2 05/24/2022   RDW 13.2 05/24/2022   LYMPHSABS 0.9 05/21/2022   MONOABS 0.7 05/21/2022   EOSABS 0.0 05/21/2022   BASOSABS 0.0 32/35/5732     Last metabolic panel Lab Results  Component Value Date   NA 136 05/24/2022   K 3.0 (L) 05/24/2022   CL 107 05/24/2022   CO2 22 05/24/2022   BUN 8 05/24/2022   CREATININE 0.76 05/24/2022   GLUCOSE 97 05/24/2022   GFRNONAA >60 05/24/2022   GFRAA >60 01/09/2019   CALCIUM 9.3 05/24/2022   PHOS 2.3 11/27/2009   PROT 5.6 (L) 05/22/2022   ALBUMIN 3.3 (L) 05/22/2022   BILITOT 0.5 05/22/2022   ALKPHOS 76 05/22/2022   AST 34 05/22/2022   ALT  23 05/22/2022   ANIONGAP 7 05/24/2022    CBG (last 3)  No results for input(s): "GLUCAP" in the last 72 hours.    Coagulation Profile: No results for input(s): "INR", "PROTIME" in the last 168  hours.   Radiology Studies: I have personally reviewed the imaging studies  DG Knee 1-2 Views Left  Result Date: 05/22/2022 CLINICAL DATA:  Postop. EXAM: LEFT KNEE - 1-2 VIEW COMPARISON:  Plain film of the tibia 05/21/2022. FINDINGS: Interval placement of plate and screw fixation hardware across the proximal tibial fracture site. Hardware appears intact and appropriately positioned. Osseous alignment is anatomic. Expected postsurgical changes are seen within the overlying soft tissues. IMPRESSION: Interval placement of plate and screw fixation hardware across the proximal tibial fracture site. No evidence of surgical complicating feature. Electronically Signed   By: Franki Cabot M.D.   On: 05/22/2022 15:07   DG Knee 1-2 Views Left  Result Date: 05/22/2022 CLINICAL DATA:  Tibial plateau fracture EXAM: LEFT KNEE - 1-2 VIEW COMPARISON:  the previous day's study FINDINGS: 2 intraoperative fluoroscopic spot images document plate and screw fixation of tibial plateau fracture fragments in near anatomic alignment. IMPRESSION: ORIF of tibial plateau fracture. Electronically Signed   By: Lucrezia Europe M.D.   On: 05/22/2022 14:15       Elvan Ebron M.D. [facility] 05/24/2022, 1:13 PM  Available via Epic secure chat 7am-7pm After 7 pm, please refer to night coverage provider listed on amion.

## 2022-05-24 NOTE — Progress Notes (Signed)
Orthopedic Surgery Progress Note   Assessment: Patient is a 71 y.o. female with left tibial plateau fracture status post ORIF   Plan: -Operative plans: complete -Okay for diet from orthopedic perspective -DVT ppx: aspirin '325mg'$  BID -Antibiotics: ancef x2 post-op doses -Weight bearing status: non-weight bearing left lower extremity with knee immobilizer on at all times -PT/OT evaluate and treat -Pain control -Dispo: pending clinical course and PT/OT  ___________________________________________________________________________  Subjective: No acute events overnight. Having some nausea this morning. No emesis. Her knee pain is similar to yesterday. Denies paresthesias and numbness.    Physical Exam:  General: no acute distress, appears stated age Neurologic: alert, answering questions appropriately, following commands Respiratory: unlabored breathing, symmetric chest rise Psychiatric: appropriate affect, normal cadence to speech  MSK:   -Left lower extremity  No tenderness to palpation over extremity, except around the knee  Small amount of dry blood on dressings otherwise c/d/i, knee immobilizer in place  Fires hip flexors, quadriceps, hamstrings, tibialis anterior, gastrocnemius and soleus, extensor hallucis longus Plantarflexes and dorsiflexes toes Sensation intact to light touch in sural, saphenous, tibial, deep peroneal, and superficial peroneal nerve distributions Foot warm and well perfused, palpable dorsalis pedis pulse    Patient name: Caitlin Irwin Patient MRN: [id number] Date: 05/24/22

## 2022-05-24 NOTE — TOC Initial Note (Signed)
Transition of Care [facility]) - Initial/Assessment Note    Patient Details  Name: Caitlin Irwin MRN: [id number] Date of Birth: 05-23-1951  Transition of Care [facility]) CM/SW Contact:    Dessa Phi, RN Phone Number: 05/24/2022, 4:38 PM  Clinical Narrative: PT recc ST SNF-patient in agreement.faxed out await bed offers,choice,  prior auth.                  Expected Discharge Plan: Skilled Nursing Facility Barriers to Discharge: Continued Medical Work up   Patient Goals and CMS Choice Patient states their goals for this hospitalization and ongoing recovery are::  (rehab)   Choice offered to / list presented to : Patient  Expected Discharge Plan and Services Expected Discharge Plan: [place]   Discharge Planning Services: CM Consult Post Acute Care Choice: [place] Living arrangements for the past 2 months: Single Family Home                                      Prior Living Arrangements/Services Living arrangements for the past 2 months: Single Family Home Lives with:: Self Patient language and need for interpreter reviewed:: Yes Do you feel safe going back to the place where you live?: Yes      Need for Family Participation in Patient Care: Yes (Comment) Care giver support system in place?: Yes (comment) Current home services: DME (rw;shower seat) Criminal Activity/Legal Involvement Pertinent to Current Situation/Hospitalization: No - Comment as needed  Activities of Daily Living Home Assistive Devices/Equipment: None ADL Screening (condition at time of admission) Patient's cognitive ability adequate to safely complete daily activities?: Yes Is the patient deaf or have difficulty hearing?: No Does the patient have difficulty seeing, even when wearing glasses/contacts?: No Does the patient have difficulty concentrating, remembering, or making decisions?: No Patient able to express need for assistance with ADLs?: Yes Does the  patient have difficulty dressing or bathing?: No Independently performs ADLs?: Yes (appropriate for developmental age) Does the patient have difficulty walking or climbing stairs?: Yes Weakness of Legs: Both Weakness of Arms/Hands: None  Permission Sought/Granted Permission sought to share information with : Case Manager Permission granted to share information with : Yes, Verbal Permission Granted  Share Information with NAME:  (Case manager)           Emotional Assessment Appearance:: Appears stated age Attitude/Demeanor/Rapport: Gracious Affect (typically observed): Accepting Orientation: : Oriented to Self, Oriented to Place, Oriented to  Time, Oriented to Situation Alcohol / Substance Use: Not Applicable Psych Involvement: No (comment)  Admission diagnosis:  Hypokalemia [E87.6] Closed displaced fracture of medial condyle of left tibia, initial encounter [S82.132A] Tibial plateau fracture, left, closed, initial encounter [S82.142A] Patient Active Problem List   Diagnosis Date Noted   Tibial plateau fracture, left, closed, initial encounter 05/21/2022   Closed displaced fracture of medial condyle of left tibia    Bilateral leg weakness 06/26/2021   Dizziness 06/26/2021   Pre-syncope 12/24/2020   Slurred speech    Transient neurologic deficit 12/22/2020   Anxiety    Orthostasis    Closed fracture of proximal end of right tibia and fibula 11/13/2018   Bilateral temporomandibular joint pain 10/21/2015   Ulceration, tongue traumatic 10/21/2015   Epigastric hernia 09/15/2015   Headache, migraine    Insomnia    Emesis    Microcytic hypochromic anemia    Salicylate poisoning 30/01/6225   Migraine headache 08/28/2014  Aspirin toxicity    Ataxia 66/12/43   Complicated grief 99/77/4142   Polypharmacy 10/13/2013   Depression 10/13/2013   Esophageal reflux 06/12/2012   Trigeminal neuralgia of left side of face 11/07/2011   Hx of migraine headaches 10/20/2011    Dysphagia, unspecified(787.20) 09/26/2011   ULCER-DUODENAL 11/26/2008   History of peptic ulcer 11/26/2008   Gastritis and gastroduodenitis 11/26/2008   Hypothyroidism 10/03/2007   CONSTIPATION, CHRONIC 10/03/2007   Irritable bowel syndrome 10/03/2007   COLITIS, HX OF 10/03/2007   PCP:  Michael Boston, MD Pharmacy:   [id number]Phone: [phone]: [phone]    Social Determinants of Health (SDOH) Interventions    Readmission Risk Interventions     No data to display

## 2022-05-24 NOTE — Evaluation (Signed)
Occupational Therapy Evaluation Patient Details Name: Caitlin Irwin MRN: [id number] DOB: [birth year] Today's Date: 05/24/2022   History of Present Illness 71 y.o. female admitted after fall at home with left tibial plateau fracture status post ORIF on 05/22/22.  PMH OA, ataxia, LBP, HLD, IBS, trigeminal neuralgia s/p resection, ankle and LE fractures and surgeries, rotator cuff repair, memory loss, migraines   Clinical Impression   The patient is currently presenting well below her baseline level of functioning for self-care management, given new  L LE non-weightbearing status, L LE ROM limitations due to applied immobilizer, impaired standing balance, post-surgical pain, and impaired functional mobility. She further reported feelings of worry regarding participation in out of bed progressive activity. She required min guard assist for supine to sit, mod assist for simulated lower body dressing, and min assist to stand using a rolling walker. She was unable to hop once in standing, therefore she required assist to stand-pivot to the bedside chair using a RW. She will benefit from further OT services to maximize her safety and independence with self-care tasks.     Recommendations for follow up therapy are one component of a multi-disciplinary discharge planning process, led by the attending physician.  Recommendations may be updated based on patient status, additional functional criteria and insurance authorization.   Follow Up Recommendations  Skilled nursing-short term rehab (<3 hours/day)    Assistance Recommended at Discharge Frequent or constant Supervision/Assistance  Patient can return home with the following Help with stairs or ramp for entrance;Assist for transportation;Assistance with cooking/housework;A little help with walking and/or transfers;A lot of help with bathing/dressing/bathroom    Functional Status Assessment  Patient has had a recent decline in their functional status  and demonstrates the ability to make significant improvements in function in a reasonable and predictable amount of time.  Equipment Recommendations  BSC/3in1       Precautions / Restrictions Precautions Precautions: Fall Required Braces or Orthoses: Knee Immobilizer - Left Knee Immobilizer - Left: On at all times Restrictions Weight Bearing Restrictions: Yes LLE Weight Bearing: Non weight bearing      Mobility Bed Mobility Overal bed mobility: Needs Assistance Bed Mobility: Supine to Sit     Supine to sit: Min guard     General bed mobility comments: increased time    Transfers Overall transfer level: Needs assistance Equipment used: Rolling walker (2 wheels) Transfers: Sit to/from Stand, Bed to chair/wheelchair/BSC Sit to Stand: Min assist Stand pivot transfers: Min assist         General transfer comment: for stand-pivot transfer, she required increased cues for transfer technique      Balance Overall balance assessment: Needs assistance   Sitting balance-Leahy Scale: Good       Standing balance-Leahy Scale: Poor               ADL either performed or assessed with clinical judgement   ADL   Eating/Feeding: Independent Eating/Feeding Details (indicate cue type and reason): based on clinical judgement Grooming: Set up;Sitting Grooming Details (indicate cue type and reason): at chair level         Upper Body Dressing : Sitting;Set up Upper Body Dressing Details (indicate cue type and reason): simulated Lower Body Dressing: Moderate assistance       Toileting- Clothing Manipulation and Hygiene: Maximal assistance Toileting - Clothing Manipulation Details (indicate cue type and reason): at bedside commode level, based on clinical judgement              Pertinent Vitals/Pain  Pain Assessment Pain Assessment: 0-10 Pain Score: 3  Pain Location: L LE Pain Intervention(s): Repositioned     Hand Dominance Right   Extremity/Trunk  Assessment Upper Extremity Assessment Upper Extremity Assessment: Overall WFL for tasks assessed   Lower Extremity Assessment LLE Deficits / Details: KI at all times RLE AROM and strength WFL      Communication Communication Communication: No difficulties   Cognition Arousal/Alertness: Awake/alert Behavior During Therapy: WFL for tasks assessed/performed   Area of Impairment: Memory           Memory: Decreased short-term memory (she reported having occasional memory deficits at her baseline)         General Comments: Oriented to person, place, year, and situation; disoriented to month                Home Living Family/patient expects to be discharged to:: Private residence Living Arrangements: Alone Available Help at Discharge: Friend(s);Family Type of Home: House Home Access: Stairs to enter Technical brewer of Steps: 3 Entrance Stairs-Rails: Left;Right Home Layout: Two level;1/2 bath on main level     Bathroom Shower/Tub: Walk-in shower         Home Equipment: Shower seat - built in   Additional Comments: all bedrooms are upstairs, however she stated she can sleep on the main level on the couch      Prior Functioning/Environment Prior Level of Function : Independent/Modified Independent             Mobility Comments: Independent with ambulation ADLs Comments: Independent with ADLs, cooking, and cleaning. She does not drive.        OT Problem List: Decreased strength;Decreased range of motion;Impaired balance (sitting and/or standing);Decreased activity tolerance;Decreased knowledge of use of DME or AE;Decreased knowledge of precautions;Pain      OT Treatment/Interventions: Self-care/ADL training;Therapeutic activities;Therapeutic exercise;Energy conservation;DME and/or AE instruction;Patient/family education;Balance training    OT Goals(Current goals can be found in the care plan section) Acute Rehab OT Goals Patient Stated Goal: not  specifically stated OT Goal Formulation: With patient Time For Goal Achievement: 06/06/22 Potential to Achieve Goals: Good  OT Frequency: Min 2X/week       AM-PAC OT "6 Clicks" Daily Activity     Outcome Measure Help from another person eating meals?: None Help from another person taking care of personal grooming?: A Little Help from another person toileting, which includes using toliet, bedpan, or urinal?: A Lot Help from another person bathing (including washing, rinsing, drying)?: A Lot Help from another person to put on and taking off regular upper body clothing?: None Help from another person to put on and taking off regular lower body clothing?: A Lot 6 Click Score: 17   End of Session Equipment Utilized During Treatment: Gait belt;Rolling walker (2 wheels) Nurse Communication: Mobility status  Activity Tolerance: Patient tolerated treatment well Patient left: in chair;with call bell/phone within reach;with chair alarm set  OT Visit Diagnosis: Unsteadiness on feet (R26.81);Pain                Time: 8315-1761 OT Time Calculation (min): 16 min Charges:  OT General Charges $OT Visit: 1 Visit OT Evaluation $OT Eval Moderate Complexity: 1 Mod    Laneta Guerin L Giannina Bartolome, OTR/L 05/24/2022, 11:59 AM

## 2022-05-25 DIAGNOSIS — S82142A Displaced bicondylar fracture of left tibia, initial encounter for closed fracture: Secondary | ICD-10-CM | POA: Diagnosis not present

## 2022-05-25 LAB — CBC
HCT: 25.3 % — ABNORMAL LOW (ref 36.0–46.0)
Hemoglobin: 8.4 g/dL — ABNORMAL LOW (ref 12.0–15.0)
MCH: 29.5 pg (ref 26.0–34.0)
MCHC: 33.2 g/dL (ref 30.0–36.0)
MCV: 88.8 fL (ref 80.0–100.0)
Platelets: 207 10*3/uL (ref 150–400)
RBC: 2.85 MIL/uL — ABNORMAL LOW (ref 3.87–5.11)
RDW: 12.9 % (ref 11.5–15.5)
WBC: 6.5 10*3/uL (ref 4.0–10.5)
nRBC: 0 % (ref 0.0–0.2)

## 2022-05-25 LAB — BASIC METABOLIC PANEL
Anion gap: 7 (ref 5–15)
BUN: 9 mg/dL (ref 8–23)
CO2: 25 mmol/L (ref 22–32)
Calcium: 9.8 mg/dL (ref 8.9–10.3)
Chloride: 106 mmol/L (ref 98–111)
Creatinine, Ser: 0.8 mg/dL (ref 0.44–1.00)
GFR, Estimated: >60 mL/min (ref >60)
Glucose, Bld: 108 mg/dL — ABNORMAL HIGH (ref 70–99)
Potassium: 3.2 mmol/L — ABNORMAL LOW (ref 3.5–5.1)
Sodium: 138 mmol/L (ref 135–145)

## 2022-05-25 MED ORDER — CHLORHEXIDINE GLUCONATE CLOTH 2 % EX PADS
6.0000 | MEDICATED_PAD | Freq: Every day | CUTANEOUS | Status: DC
  Administered 2022-05-25 – 2022-05-27 (×3): 6 via TOPICAL

## 2022-05-25 MED ORDER — CYANOCOBALAMIN 1000 MCG/ML IJ SOLN
1000.0000 ug | Freq: Every day | INTRAMUSCULAR | Status: AC
  Administered 2022-05-25 – 2022-05-27 (×3): 1000 ug via SUBCUTANEOUS
  Filled 2022-05-25 (×3): qty 1

## 2022-05-25 MED ORDER — POTASSIUM CHLORIDE CRYS ER 20 MEQ PO TBCR
40.0000 meq | EXTENDED_RELEASE_TABLET | Freq: Two times a day (BID) | ORAL | Status: AC
  Administered 2022-05-25 – 2022-05-27 (×6): 40 meq via ORAL
  Filled 2022-05-25 (×6): qty 2

## 2022-05-25 MED ORDER — VITAMIN B-12 1000 MCG PO TABS: 1000.0000 ug | ORAL_TABLET | Freq: Every day | ORAL | Status: DC

## 2022-05-25 NOTE — Plan of Care (Signed)
  Problem: Education: Goal: Knowledge of General Education information will improve Description: Including pain rating scale, medication(s)/side effects and non-pharmacologic comfort measures Outcome: Completed/Met   Problem: Clinical Measurements: Goal: Ability to maintain clinical measurements within normal limits will improve Outcome: Completed/Met Goal: Respiratory complications will improve Outcome: Completed/Met Goal: Cardiovascular complication will be avoided Outcome: Completed/Met

## 2022-05-25 NOTE — Progress Notes (Signed)
Physical Therapy Treatment Patient Details Name: Caitlin Irwin MRN: [id number] DOB: [birth year] Today's Date: 05/25/2022   History of Present Illness 71 y.o. female admitted after fall at home with left tibial plateau fracture status post ORIF on 05/22/22.  PMH OA, ataxia, LBP, HLD, IBS, trigeminal neuralgia s/p resection, ankle and LE fractures and surgeries, rotator cuff repair, memory loss, migraines    PT Comments    Pt reports pain improved and agreeable to attempt ambulating.  Pt only able to perform 6 feet and then reported UE fatigue.  Continue to recommend SNF upon d/c.  Pt encouraged to perform chair push ups and ankle pumps while up in recliner today.    Recommendations for follow up therapy are one component of a multi-disciplinary discharge planning process, led by the attending physician.  Recommendations may be updated based on patient status, additional functional criteria and insurance authorization.  Follow Up Recommendations  Skilled nursing-short term rehab (<3 hours/day) Can patient physically be transported by private vehicle: No   Assistance Recommended at Discharge Frequent or constant Supervision/Assistance  Patient can return home with the following A lot of help with walking and/or transfers;A little help with bathing/dressing/bathroom;Assistance with cooking/housework;Help with stairs or ramp for entrance;Assist for transportation   Equipment Recommendations  None recommended by PT    Recommendations for Other Services       Precautions / Restrictions Precautions Precautions: Fall Required Braces or Orthoses: Knee Immobilizer - Left Knee Immobilizer - Left: On at all times Restrictions Weight Bearing Restrictions: Yes LLE Weight Bearing: Non weight bearing     Mobility  Bed Mobility Overal bed mobility: Needs Assistance Bed Mobility: Supine to Sit     Supine to sit: Min assist     General bed mobility comments: assist for L LE per pt  request, cues for scooting to EOB    Transfers Overall transfer level: Needs assistance Equipment used: Rolling walker (2 wheels) Transfers: Sit to/from Stand Sit to Stand: Min assist           General transfer comment: verbal cues for positioning and assist to rise and steady, assist to control descent due to upper body fatigue    Ambulation/Gait Ambulation/Gait assistance: Min guard, Min assist Gait Distance (Feet): 6 Feet Assistive device: Rolling walker (2 wheels)         General Gait Details: swing through, able to maintain NWB L LE however pt reports increased UE fatigue and only tolerated 6 feet, recliner provided   Stairs             Wheelchair Mobility    Modified Rankin (Stroke Patients Only)       Balance                                            Cognition Arousal/Alertness: Awake/alert Behavior During Therapy: WFL for tasks assessed/performed                         Memory:  (she reported having occasional memory deficits at her baseline)         General Comments: follows commands, appropriate during session (short term memory deficits per OT note)        Exercises Total Joint Exercises Ankle Circles/Pumps: AROM, Both, 10 reps General Exercises - Upper Extremity Chair Push Up: AROM, Both, 10 reps    General  Comments        Pertinent Vitals/Pain Pain Assessment Pain Assessment: 0-10 Pain Score: 3  Pain Location: L LE Pain Descriptors / Indicators: Sore, Discomfort Pain Intervention(s): Repositioned, Monitored during session, Premedicated before session    Home Living                          Prior Function            PT Goals (current goals can now be found in the care plan section) Progress towards PT goals: Progressing toward goals    Frequency    Min 3X/week      PT Plan Current plan remains appropriate    Co-evaluation              AM-PAC PT "6 Clicks"  Mobility   Outcome Measure  Help needed turning from your back to your side while in a flat bed without using bedrails?: A Little Help needed moving from lying on your back to sitting on the side of a flat bed without using bedrails?: A Little Help needed moving to and from a bed to a chair (including a wheelchair)?: A Lot Help needed standing up from a chair using your arms (e.g., wheelchair or bedside chair)?: A Lot Help needed to walk in hospital room?: A Lot Help needed climbing 3-5 steps with a railing? : Total 6 Click Score: 13    End of Session Equipment Utilized During Treatment: Gait belt;Left knee immobilizer Activity Tolerance: Patient limited by fatigue Patient left: in chair;with call bell/phone within reach;with chair alarm set Nurse Communication: Mobility status PT Visit Diagnosis: Difficulty in walking, not elsewhere classified (R26.2);Unsteadiness on feet (R26.81)     Time: 1210-1222 PT Time Calculation (min) (ACUTE ONLY): 12 min  Charges:  $Gait Training: 8-22 mins                     Arlyce Dice, DPT Physical Therapist Acute Rehabilitation Services Preferred contact method: Secure Chat Weekend Pager Only: [phone] Office: [place] 05/25/2022, 3:36 PM

## 2022-05-25 NOTE — TOC Progression Note (Signed)
Transition of Care [facility]) - Progression Note    Patient Details  Name: AEVAH STANSBERY MRN: [id number] Date of Birth: [birth year]  [facility]) CM/SW Contact  Joaquin Courts, RN Phone Number: 05/25/2022, 4:10 PM  Clinical Narrative:    CM met with patient at bedside and presented bed offers.  Patient reports she will review the offers prior to making a final decision.  TOC will follow for facility choice.   Expected Discharge Plan: [place] Barriers to Discharge: Continued Medical Work up  Expected Discharge Plan and Services Expected Discharge Plan: [place]   Discharge Planning Services: CM Consult Post Acute Care Choice: [place] Living arrangements for the past 2 months: Single Family Home                                       Social Determinants of Health (SDOH) Interventions    Readmission Risk Interventions     No data to display

## 2022-05-25 NOTE — Progress Notes (Signed)
Orthopedic Surgery Progress Note   Assessment: Patient is a 71 y.o. female with left tibial plateau fracture status post ORIF   Plan: -Operative plans: complete -Okay for diet from orthopedic perspective -DVT ppx: aspirin '325mg'$  BID -Antibiotics: ancef x2 post-op doses -Weight bearing status: non-weight bearing left lower extremity with knee immobilizer on at all times -PT/OT evaluate and treat -Pain control -Dispo: pending clinical course and PT/OT  ___________________________________________________________________________  Subjective: No acute events overnight. Pain in her knee is getting better. Denies paresthesias and numbness. Worried about going to skilled nursing and would prefer to go home. She has a dog at home, Toaster, that she wants to be with. I explained that it would be hard to go home with her being non-weight bearing on one leg. She expressed understanding. She thinks her neighbor Thurmond Butts can help with Toaster if she goes to a skilled nursing facility.    Physical Exam:  General: no acute distress, appears stated age Neurologic: alert, answering questions appropriately, following commands Respiratory: unlabored breathing, symmetric chest rise Psychiatric: appropriate affect, normal cadence to speech  MSK:   -Left lower extremity  No tenderness to palpation over extremity, except around the knee  Small amount of dry blood on dressings (similar to yesterday) otherwise c/d/i, knee immobilizer in place  Fires hip flexors, quadriceps, hamstrings, tibialis anterior, gastrocnemius and soleus, extensor hallucis longus Plantarflexes and dorsiflexes toes Sensation intact to light touch in sural, saphenous, tibial, deep peroneal, and superficial peroneal nerve distributions Foot warm and well perfused, palpable dorsalis pedis pulse    Patient name: Caitlin Irwin Patient MRN: [id number] Date: 05/25/22

## 2022-05-25 NOTE — Progress Notes (Signed)
Caitlin Irwin  IHK:[id number] DOB: July 30, 1951 DOA: 05/21/2022 PCP: Michael Boston, MD    Brief Narrative:  71 year old with a history of migraines, depression, anxiety, hypothyroidism, IBS, GERD, chronic anemia and chronic pain who suffered a fall at home after she became lightheaded and dizzy.  She landed on her left knee.  She did not lose consciousness.  She did not strike her head.  She had reported poor appetite and consumption of liquids in the days prior for no particular reason.  At presentation she is found to have a potassium of 2.5.  X-rays revealed a tibial plateau fracture on the left.  Consultants:  None  Goals of Care:  Code Status: Full Code   DVT prophylaxis: ASA per orthopedics  Interim Hx: Afebrile.  Vital signs stable.  Potassium improved.  Hemoglobin stabilizing.  No new complaints.  Reports her pain is well controlled.  Denies chest pain or shortness of breath.  Assessment & Plan:  Left tibial plateau fracture with suprapatellar effusion Status post ORIF left bicondylar tibial plateau fracture -postoperative care per orthopedics -awaiting short-term SNF placement for rehab stay  Chronic normocytic anemia -acute postoperative blood loss Baseline hemoglobin approximately 12 -postoperative hemoglobin 8.3 -no indication for transfusion thus far  Hypokalemia Due to simple poor intake -supplement as needed and follow  Near syncope Did not suffer an actual syncopal episode -likely due to poor intake and exacerbated by severe hypokalemia -cortisol normal -TSH within normal range  Folic acid deficiency Folate found to be 4.6 and likely due to poor intake -supplemented  Relative B12 deficiency B12 found to be low at 287 -likely due to poor intake -supplemented -will need outpatient follow-up  HTN Continue usual home blood pressure medications  Hypothyroidism Continue usual home Synthroid dose  GERD with history of PUD Continue PPI  Family Communication:  No family present at time of exam Disposition: Awaiting SNF placement for short-term rehab   Objective: Blood pressure (!) 152/56, pulse 67, temperature 98.2 F (36.8 C), temperature source Oral, resp. rate 16, height 4' 9.99" (1.473 m), weight 53.8 kg, SpO2 99 %.  Intake/Output Summary (Last 24 hours) at 05/25/2022 0954 Last data filed at 05/25/2022 7564 Gross per 24 hour  Intake 480 ml  Output 1350 ml  Net -870 ml   Filed Weights   05/22/22 0355  Weight: 53.8 kg    Examination: General: No acute respiratory distress Lungs: Clear to auscultation bilaterally without wheezes or crackles Cardiovascular: Regular rate and rhythm without murmur gallop or rub normal S1 and S2 Abdomen: Nontender, nondistended, soft, bowel sounds positive, no rebound, no ascites, no appreciable mass Extremities: No significant cyanosis, clubbing, or edema bilateral lower extremities  CBC: Recent Labs  Lab 05/21/22 1907 05/22/22 0525 05/23/22 0444 05/24/22 0451 05/25/22 0433  WBC 12.2*   < > 8.1 7.2 6.5  NEUTROABS 10.4*  --   --   --   --   HGB 12.6   < > 8.9* 8.3* 8.4*  HCT 38.4   < > 27.2* 25.8* 25.3*  MCV 89.9   < > 89.8 91.5 88.8  PLT 304   < > 211 169 207   < > = values in this interval not displayed.   Basic Metabolic Panel: Recent Labs  Lab 05/21/22 2213 05/22/22 0525 05/23/22 0444 05/24/22 0451 05/25/22 0433  NA  --    < > 139 136 138  K  --    < > 3.0* 3.0* 3.2*  CL  --    < >  109 107 106  CO2  --    < > '23 22 25  '$ GLUCOSE  --    < > 107* 97 108*  BUN  --    < > '9 8 9  '$ CREATININE  --    < > 0.79 0.76 0.80  CALCIUM  --    < > 9.5 9.3 9.8  MG 1.9  --   --   --   --    < > = values in this interval not displayed.   GFR: Estimated Creatinine Clearance: 45.6 mL/min (by C-G formula based on SCr of 0.8 mg/dL).   Scheduled Meds:  aspirin  325 mg Oral BID   vitamin B-12  500 mcg Oral Daily   docusate sodium  100 mg Oral BID   feeding supplement  237 mL Oral BID BM    FLUoxetine  40 mg Oral Daily   folic acid  1 mg Oral Daily   gabapentin  100 mg Oral TID   ketorolac  15 mg Intravenous Q6H   levothyroxine  75 mcg Oral Q0600   pantoprazole  40 mg Oral Q0600   sodium chloride flush  3 mL Intravenous Q12H   spironolactone  25 mg Oral Daily   tamsulosin  0.4 mg Oral Daily   topiramate  50 mg Oral BID   Continuous Infusions:  methocarbamol (ROBAXIN) IV       LOS: 3 days   Cherene Altes, MD [facility] Office  [phone] Pager - Text Page per Shea Evans  If 7PM-7AM, please contact night-coverage per Amion 05/25/2022, 9:54 AM

## 2022-05-25 NOTE — Care Management Important Message (Signed)
Important Message  Patient Details IM Letter given Name: Caitlin Irwin MRN: [id number] Date of Birth: 1951/07/07   Medicare Important Message Given:  Yes     Kerin Salen 05/25/2022, 2:05 PM

## 2022-05-26 ENCOUNTER — Inpatient Hospital Stay (HOSPITAL_COMMUNITY): Payer: PPO

## 2022-05-26 DIAGNOSIS — S82142A Displaced bicondylar fracture of left tibia, initial encounter for closed fracture: Secondary | ICD-10-CM | POA: Diagnosis not present

## 2022-05-26 LAB — BASIC METABOLIC PANEL
Anion gap: 3 — ABNORMAL LOW (ref 5–15)
BUN: 13 mg/dL (ref 8–23)
CO2: 24 mmol/L (ref 22–32)
Calcium: 9.9 mg/dL (ref 8.9–10.3)
Chloride: 113 mmol/L — ABNORMAL HIGH (ref 98–111)
Creatinine, Ser: 0.83 mg/dL (ref 0.44–1.00)
GFR, Estimated: >60 mL/min (ref >60)
Glucose, Bld: 98 mg/dL (ref 70–99)
Potassium: 3.3 mmol/L — ABNORMAL LOW (ref 3.5–5.1)
Sodium: 140 mmol/L (ref 135–145)

## 2022-05-26 LAB — CBC
HCT: 24.9 % — ABNORMAL LOW (ref 36.0–46.0)
Hemoglobin: 8.5 g/dL — ABNORMAL LOW (ref 12.0–15.0)
MCH: 30.1 pg (ref 26.0–34.0)
MCHC: 34.1 g/dL (ref 30.0–36.0)
MCV: 88.3 fL (ref 80.0–100.0)
Platelets: 258 10*3/uL (ref 150–400)
RBC: 2.82 MIL/uL — ABNORMAL LOW (ref 3.87–5.11)
RDW: 13 % (ref 11.5–15.5)
WBC: 6.2 10*3/uL (ref 4.0–10.5)
nRBC: 0 % (ref 0.0–0.2)

## 2022-05-26 LAB — MAGNESIUM: Magnesium: 2 mg/dL (ref 1.7–2.4)

## 2022-05-26 MED ORDER — MAGNESIUM CITRATE PO SOLN: 1.0000 | Freq: Once | ORAL | Status: DC | PRN

## 2022-05-26 MED ORDER — SENNOSIDES-DOCUSATE SODIUM 8.6-50 MG PO TABS
1.0000 | ORAL_TABLET | Freq: Two times a day (BID) | ORAL | Status: DC
  Administered 2022-05-26 – 2022-05-28 (×4): 1 via ORAL
  Filled 2022-05-26 (×4): qty 1

## 2022-05-26 MED ORDER — BISACODYL 10 MG RE SUPP
10.0000 mg | Freq: Every day | RECTAL | Status: DC | PRN
  Administered 2022-05-26: 10 mg via RECTAL
  Filled 2022-05-26: qty 1

## 2022-05-26 MED ORDER — OXYCODONE HCL 5 MG PO TABS
5.0000 mg | ORAL_TABLET | ORAL | Status: DC | PRN
  Administered 2022-05-26 – 2022-05-28 (×7): 5 mg via ORAL
  Filled 2022-05-26 (×8): qty 1

## 2022-05-26 NOTE — Progress Notes (Signed)
Orthopedic Surgery Progress Note   Assessment: Patient is a 71 y.o. female with left tibial plateau fracture status post ORIF   Plan: -Operative plans: complete -Okay for diet from orthopedic perspective -DVT ppx: aspirin '325mg'$  BID -Antibiotics: ancef x2 post-op doses -Weight bearing status: non-weight bearing left lower extremity with knee immobilizer on at all times -PT/OT evaluate and treat -Pain control -Ordered left femur films today -Will change dressings and may get repeat knee x-rays depending on when she is set to discharge -Readjusted her knee immobilizer so it is centered over the knee -Dispo: pending clinical course and PT/OT  ___________________________________________________________________________  Subjective: No acute events overnight. Pain continues to get better in her knee. She does not have any pain when laying in bed in her knee. However, when she is helped to get to the commode or chair, the knee hurts. Reports left thigh pain this morning that she says is more bothersome than her knee when laying in bed. Denies paresthesias and numbness. States she has picked out a facility to go to at discharge that is near her home.    Physical Exam:  General: no acute distress, appears stated age Neurologic: alert, answering questions appropriately, following commands Respiratory: unlabored breathing, symmetric chest rise Psychiatric: appropriate affect, normal cadence to speech  MSK:   -Left lower extremity  No tenderness to palpation over extremity, except around the knee  Small amount of dry blood on dressings (unchanged from prior two days) otherwise c/d/i, knee immobilizer in place  Fires hip flexors, quadriceps, hamstrings, tibialis anterior, gastrocnemius and soleus, extensor hallucis longus Plantarflexes and dorsiflexes toes Sensation intact to light touch in sural, saphenous, tibial, deep peroneal, and superficial peroneal nerve distributions Foot warm and  well perfused, palpable dorsalis pedis pulse    Patient name: Caitlin Irwin Patient MRN: [id number] Date: 05/26/22

## 2022-05-26 NOTE — Progress Notes (Signed)
Caitlin Irwin  ZOX:[id number] DOB: 1950-08-23 DOA: 05/21/2022 PCP: Michael Boston, MD    Brief Narrative:  71 year old with a history of migraines, depression, anxiety, hypothyroidism, IBS, GERD, chronic anemia and chronic pain who suffered a fall at home after she became lightheaded and dizzy.  She landed on her left knee.  She did not lose consciousness.  She did not strike her head.  She had reported poor appetite and consumption of liquids in the days prior for no particular reason.  At presentation she is found to have a potassium of 2.5.  X-rays revealed a tibial plateau fracture on the left.  Consultants:  Ortho  Goals of Care:  Code Status: Full Code   DVT prophylaxis: ASA per orthopedics  Interim Hx: No acute events reported overnight.  Afebrile with stable vitals.  Potassium has improved and hemoglobin is holding steady.  Resting comfortably in bed.  No respiratory distress.  Assessment & Plan:  Left tibial plateau fracture with suprapatellar effusion Status post ORIF left bicondylar tibial plateau fracture -postoperative care per Orthopedics -awaiting short-term SNF placement for rehab stay - Ortho recs are as follows:  -DVT ppx: aspirin '325mg'$  BID -Weight bearing status: non-weight bearing left lower extremity with knee immobilizer on at all times -Will change dressings and get repeat knee x-rays  -Readjust her knee immobilizer so it is centered over the knee  Chronic normocytic anemia -acute postoperative blood loss Baseline hemoglobin approximately 12 -postoperative hemoglobin 8.3 -no indication for transfusion thus far -hemoglobin holding steady  Hypokalemia Due to simple poor intake -improving with supplementation  Near syncope Did not suffer an actual syncopal episode -likely due to poor intake and exacerbated by severe hypokalemia -cortisol normal -TSH within normal range  Folic acid deficiency Folate found to be 4.6 - likely due to poor intake  -supplemented  Relative B12 deficiency B12 found to be low at 287 - likely due to poor intake - supplemented SQ during admission then changed to oral at d/c - will need outpatient follow-up  HTN Continue usual home blood pressure medications  Hypothyroidism Continue usual home Synthroid dose  GERD with history of PUD Continue PPI  Family Communication: No family present at time of exam Disposition: Awaiting SNF placement for short-term rehab   Objective: Blood pressure 122/74, pulse 79, temperature 98.4 F (36.9 C), temperature source Oral, resp. rate 16, height 4' 9.99" (1.473 m), weight 53.8 kg, SpO2 100 %.  Intake/Output Summary (Last 24 hours) at 05/26/2022 0853 Last data filed at 05/26/2022 0524 Gross per 24 hour  Intake 1203 ml  Output 1250 ml  Net -47 ml    Filed Weights   05/22/22 0355  Weight: 53.8 kg    Examination: General: No acute respiratory distress Cardiovascular: Regular rate and rhythm  Extremities: No significant edema bilateral lower extremities  CBC: Recent Labs  Lab 05/21/22 1907 05/22/22 0525 05/24/22 0451 05/25/22 0433 05/26/22 0535  WBC 12.2*   < > 7.2 6.5 6.2  NEUTROABS 10.4*  --   --   --   --   HGB 12.6   < > 8.3* 8.4* 8.5*  HCT 38.4   < > 25.8* 25.3* 24.9*  MCV 89.9   < > 91.5 88.8 88.3  PLT 304   < > 169 207 258   < > = values in this interval not displayed.    Basic Metabolic Panel: Recent Labs  Lab 05/21/22 2213 05/22/22 0525 05/24/22 0451 05/25/22 0433 05/26/22 0535  NA  --    < >  136 138 140  K  --    < > 3.0* 3.2* 3.3*  CL  --    < > 107 106 113*  CO2  --    < > '22 25 24  '$ GLUCOSE  --    < > 97 108* 98  BUN  --    < > '8 9 13  '$ CREATININE  --    < > 0.76 0.80 0.83  CALCIUM  --    < > 9.3 9.8 9.9  MG 1.9  --   --   --  2.0   < > = values in this interval not displayed.    GFR: Estimated Creatinine Clearance: 43.9 mL/min (by C-G formula based on SCr of 0.83 mg/dL).   Scheduled Meds:  aspirin  325 mg Oral  BID   Chlorhexidine Gluconate Cloth  6 each Topical Daily   cyanocobalamin  1,000 mcg Subcutaneous Daily   docusate sodium  100 mg Oral BID   feeding supplement  237 mL Oral BID BM   FLUoxetine  40 mg Oral Daily   folic acid  1 mg Oral Daily   gabapentin  100 mg Oral TID   ketorolac  15 mg Intravenous Q6H   levothyroxine  75 mcg Oral Q0600   pantoprazole  40 mg Oral Q0600   potassium chloride  40 mEq Oral BID   sodium chloride flush  3 mL Intravenous Q12H   spironolactone  25 mg Oral Daily   tamsulosin  0.4 mg Oral Daily   topiramate  50 mg Oral BID   Continuous Infusions:  methocarbamol (ROBAXIN) IV       LOS: 4 days   Cherene Altes, MD [facility] Office  [phone] Pager - Text Page per Shea Evans  If 7PM-7AM, please contact night-coverage per Amion 05/26/2022, 8:53 AM

## 2022-05-26 NOTE — TOC Progression Note (Signed)
Transition of Care [facility]) - Progression Note    Patient Details  Name: Caitlin Irwin MRN: [id number] Date of Birth: 06-22-1951  [facility]) CM/SW Contact  Juliona Vales, Juliann Pulse, RN Phone Number: 05/26/2022, 2:48 PM  Clinical Narrative: Patient chose [facility]avin Leech following;HTA rep Colletta [place] initiating Josem Kaufmann for [facility], & PTAR-await outcome.      Expected Discharge Plan: Skilled Nursing Facility Barriers to Discharge: Ship broker  Expected Discharge Plan and Services Expected Discharge Plan: [place]   Discharge Planning Services: CM Consult Post Acute Care Choice: [place] Living arrangements for the past 2 months: Single Family Home                                       Social Determinants of Health (SDOH) Interventions    Readmission Risk Interventions     No data to display

## 2022-05-27 DIAGNOSIS — S82142A Displaced bicondylar fracture of left tibia, initial encounter for closed fracture: Secondary | ICD-10-CM | POA: Diagnosis not present

## 2022-05-27 LAB — BASIC METABOLIC PANEL
Anion gap: 3 — ABNORMAL LOW (ref 5–15)
BUN: 17 mg/dL (ref 8–23)
CO2: 23 mmol/L (ref 22–32)
Calcium: 10 mg/dL (ref 8.9–10.3)
Chloride: 110 mmol/L (ref 98–111)
Creatinine, Ser: 1.02 mg/dL — ABNORMAL HIGH (ref 0.44–1.00)
GFR, Estimated: 58 mL/min — ABNORMAL LOW (ref >60)
Glucose, Bld: 110 mg/dL — ABNORMAL HIGH (ref 70–99)
Potassium: 4.3 mmol/L (ref 3.5–5.1)
Sodium: 136 mmol/L (ref 135–145)

## 2022-05-27 NOTE — Progress Notes (Signed)
Orthopedic Surgery Progress Note   Assessment: Patient is a 71 y.o. female with left tibial plateau fracture status post ORIF   Plan: -Operative plans: complete -Dressing replaced today, ace and knee immobilizer replaced -Okay for diet from orthopedic perspective -DVT ppx: aspirin '325mg'$  BID -Antibiotics: ancef x2 post-op doses -Weight bearing status: non-weight bearing left lower extremity with knee immobilizer on at all times -PT/OT evaluate and treat -Pain control -Will get XRs of knee prior to discharge -Will want her to follow up in office 10 days from discharge  ___________________________________________________________________________  Subjective: No acute events overnight. No pain outside of the left knee. Talked about SNF. She is thinking of choosing the one closer to home so she is nearer to her friends. Denies paresthesias and numbness.    Physical Exam:  General: no acute distress, appears stated age Neurologic: alert, answering questions appropriately, following commands Respiratory: unlabored breathing, symmetric chest rise Psychiatric: appropriate affect, normal cadence to speech  MSK:   -Left lower extremity  Small amount of dry blood on dressings otherwise c/d/i, knee immobilizer in place   On taking down dressings, incisions appear well approximated with suture in place. No active or expressible drainage. No erythema around the incisions Fires hip flexors, quadriceps, hamstrings, tibialis anterior, gastrocnemius and soleus, extensor hallucis longus Plantarflexes and dorsiflexes toes Sensation intact to light touch in sural, saphenous, tibial, deep peroneal, and superficial peroneal nerve distributions Foot warm and well perfused, palpable dorsalis pedis pulse    Patient name: Caitlin Irwin Patient MRN: [id number] Date: 05/27/22

## 2022-05-27 NOTE — Progress Notes (Signed)
Caitlin Irwin  VXY:[id number] DOB: [birth year] DOA: 05/21/2022 PCP: Michael Boston, MD    Brief Narrative:  71 year old with a history of migraines, depression, anxiety, hypothyroidism, IBS, GERD, chronic anemia and chronic pain who suffered a fall at home after she became lightheaded and dizzy.  She landed on her left knee.  She did not lose consciousness.  She did not strike her head.  She had reported poor appetite and consumption of liquids in the days prior for no particular reason.  At presentation she is found to have a potassium of 2.5.  X-rays revealed a tibial plateau fracture on the left.  Consultants:  Ortho  Goals of Care:  Code Status: Full Code   DVT prophylaxis: ASA per orthopedics  Interim Hx: Afebrile.  Vital signs stable.  Saturation 90% room air.  Electrolytes stable.  Medically ready for discharge.  Awaiting SNF bed.  Assessment & Plan:  Left tibial plateau fracture with suprapatellar effusion Status post ORIF left bicondylar tibial plateau fracture -postoperative care per Orthopedics -awaiting short-term SNF placement for rehab stay - Ortho recs are as follows:  -DVT ppx: aspirin '325mg'$  BID -Weight bearing status: non-weight bearing left lower extremity with knee immobilizer on at all times -Dressing changed prior to discharge -Follow-up knee x-rays accomplished prior to discharge -Readjust her knee immobilizer so it is centered over the knee -To be seen in orthopedist office 10 days post discharge  Chronic normocytic anemia -acute postoperative blood loss Baseline hemoglobin approximately 12 -postoperative hemoglobin 8.3 -no indication for transfusion thus far - hemoglobin holding steady  Hypokalemia Due to simple poor intake - corrected with supplementation  Near syncope Did not suffer an actual syncopal episode -likely due to poor intake and exacerbated by severe hypokalemia -cortisol normal -TSH within normal range  Folic acid deficiency Folate found  to be 4.6 - likely due to poor intake -supplemented  Relative B12 deficiency B12 found to be low at 287 - likely due to poor intake - supplemented SQ during admission then changed to oral at d/c - will need outpatient follow-up in 6-8 weeks to assure she is absorbing the B12 via oral dosing   HTN Continue usual home blood pressure medications  Hypothyroidism Continue usual home Synthroid dose  GERD with history of PUD Continue PPI  Family Communication: No family present at time of exam Disposition: Awaiting SNF placement for short-term rehab   Objective: Blood pressure 134/68, pulse 70, temperature 98.7 F (37.1 C), temperature source Oral, resp. rate 18, height 4' 9.99" (1.473 m), weight 53.8 kg, SpO2 98 %.  Intake/Output Summary (Last 24 hours) at 05/27/2022 0930 Last data filed at 05/27/2022 0513 Gross per 24 hour  Intake 360 ml  Output 800 ml  Net -440 ml    Filed Weights   05/22/22 0355  Weight: 53.8 kg    Examination: General: No acute respiratory distress Cardiovascular: Regular rate and rhythm -no murmur Abdom: benign - BS+ Extremities: No significant edema bilateral lower extremities  CBC: Recent Labs  Lab 05/21/22 1907 05/22/22 0525 05/24/22 0451 05/25/22 0433 05/26/22 0535  WBC 12.2*   < > 7.2 6.5 6.2  NEUTROABS 10.4*  --   --   --   --   HGB 12.6   < > 8.3* 8.4* 8.5*  HCT 38.4   < > 25.8* 25.3* 24.9*  MCV 89.9   < > 91.5 88.8 88.3  PLT 304   < > 169 207 258   < > = values in  this interval not displayed.    Basic Metabolic Panel: Recent Labs  Lab 05/21/22 2213 05/22/22 0525 05/25/22 0433 05/26/22 0535 05/27/22 0459  NA  --    < > 138 140 136  K  --    < > 3.2* 3.3* 4.3  CL  --    < > 106 113* 110  CO2  --    < > '25 24 23  '$ GLUCOSE  --    < > 108* 98 110*  BUN  --    < > '9 13 17  '$ CREATININE  --    < > 0.80 0.83 1.02*  CALCIUM  --    < > 9.8 9.9 10.0  MG 1.9  --   --  2.0  --    < > = values in this interval not displayed.     GFR: Estimated Creatinine Clearance: 35.7 mL/min (A) (by C-G formula based on SCr of 1.02 mg/dL (H)).   Scheduled Meds:  aspirin  325 mg Oral BID   Chlorhexidine Gluconate Cloth  6 each Topical Daily   cyanocobalamin  1,000 mcg Subcutaneous Daily   feeding supplement  237 mL Oral BID BM   FLUoxetine  40 mg Oral Daily   folic acid  1 mg Oral Daily   gabapentin  100 mg Oral TID   ketorolac  15 mg Intravenous Q6H   levothyroxine  75 mcg Oral Q0600   pantoprazole  40 mg Oral Q0600   potassium chloride  40 mEq Oral BID   senna-docusate  1 tablet Oral BID   sodium chloride flush  3 mL Intravenous Q12H   spironolactone  25 mg Oral Daily   tamsulosin  0.4 mg Oral Daily   topiramate  50 mg Oral BID   Continuous Infusions:  methocarbamol (ROBAXIN) IV       LOS: 5 days   Cherene Altes, MD [facility] Office  [phone] Pager - Text Page per Amion  If 7PM-7AM, please contact night-coverage per Amion 05/27/2022, 9:30 AM

## 2022-05-27 NOTE — TOC Progression Note (Addendum)
Transition of Care [facility]) - Progression Note    Patient Details  Name: Caitlin Irwin MRN: [id number] Date of Birth: Jul 18, 1951  Transition [facility] Care [facility]) CM/SW Contact  Zarayah Lanting, Juliann Pulse, RN Phone Number: 05/27/2022, 1:08 PM  Clinical Narrative: Left message w/HTA rep Marlowe Kays [phone]  for f/u on auth for [facility], & PTAR-await outcome.  -3:29p-TC again to HTA rep Marlowe Kays still in review-awaiting outcome.    Expected Discharge Plan: Skilled Nursing Facility Barriers to Discharge: Ship broker  Expected Discharge Plan and Services Expected Discharge Plan: [place]   Discharge Planning Services: CM Consult Post Acute Care Choice: [place] Living arrangements for the past 2 months: Single Family Home                                       Social Determinants of Health (SDOH) Interventions    Readmission Risk Interventions     No data to display

## 2022-05-27 NOTE — Progress Notes (Signed)
Physical Therapy Treatment Patient Details Name: Caitlin Irwin MRN: [id number] DOB: 1951/04/20 Today's Date: 05/27/2022   History of Present Illness 71 y.o. female admitted after fall at home with left tibial plateau fracture status post ORIF on 05/22/22.  PMH OA, ataxia, LBP, HLD, IBS, trigeminal neuralgia s/p resection, ankle and LE fractures and surgeries, rotator cuff repair, memory loss, migraines    PT Comments    Pt continues to progress toward acute PT goals this session with progression of ambulation distance. Pt ambulated ~70f and additional 122ffollowing seated rest break with MINA-MIN guard. Pt demonstrates good adherence to NWB restrictions of L LE. Remains limited by pain and UE muscle fatigue.  Review of circulation interventions, pt demonstrated good understanding. Pt will benefit from continued skilled PT to increase independence and maximize safety with mobility.     Recommendations for follow up therapy are one component of a multi-disciplinary discharge planning process, led by the attending physician.  Recommendations may be updated based on patient status, additional functional criteria and insurance authorization.  Follow Up Recommendations  Skilled nursing-short term rehab (<3 hours/day) Can patient physically be transported by private vehicle: No   Assistance Recommended at Discharge Frequent or constant Supervision/Assistance  Patient can return home with the following A little help with bathing/dressing/bathroom;Assistance with cooking/housework;Help with stairs or ramp for entrance;Assist for transportation;A little help with walking and/or transfers   Equipment Recommendations  None recommended by PT    Recommendations for Other Services       Precautions / Restrictions Precautions Precautions: Fall Required Braces or Orthoses: Knee Immobilizer - Left Knee Immobilizer - Left: On at all times Restrictions Weight Bearing Restrictions: Yes LLE Weight  Bearing: Non weight bearing     Mobility  Bed Mobility Overal bed mobility: Needs Assistance Bed Mobility: Supine to Sit     Supine to sit: Supervision     General bed mobility comments: able to progress LE to EOB with increased time. HOB elevated    Transfers Overall transfer level: Needs assistance Equipment used: Rolling walker (2 wheels) Transfers: Sit to/from Stand Sit to Stand: Min assist           General transfer comment: x2; verbal cues for positioning and hand placement with sit to/from stand and assist to steady    Ambulation/Gait Ambulation/Gait assistance: Min guard, Min assist Gait Distance (Feet): 8 Feet Assistive device: Rolling walker (2 wheels)         General Gait Details: additional 1653following seated rest break. swing through, able to maintain NWB L LE   Stairs             Wheelchair Mobility    Modified Rankin (Stroke Patients Only)       Balance Overall balance assessment: Needs assistance Sitting-balance support: No upper extremity supported Sitting balance-Leahy Scale: Good     Standing balance support: Bilateral upper extremity supported, Reliant on assistive device for balance Standing balance-Leahy Scale: Poor Standing balance comment: reliant on external support due to NWB restrictions on L LE                            Cognition Arousal/Alertness: Awake/alert Behavior During Therapy: WFL for tasks assessed/performed Overall Cognitive Status: Within Functional Limits for tasks assessed  Exercises General Exercises - Lower Extremity Ankle Circles/Pumps: AROM, Both, 20 reps, Seated Other Exercises Other Exercises: glute squeeze: 5sec hold x10    General Comments        Pertinent Vitals/Pain Pain Assessment Pain Assessment: 0-10 Pain Score: 7  Pain Location: L LE Pain Descriptors / Indicators: Aching, Discomfort, Grimacing, Sore,  Sharp Pain Intervention(s): Limited activity within patient's tolerance, Monitored during session, Repositioned, Ice applied    Home Living                          Prior Function            PT Goals (current goals can now be found in the care plan section) Acute Rehab PT Goals Patient Stated Goal: have less pain and be able to move better PT Goal Formulation: With patient Time For Goal Achievement: 06/06/22 Potential to Achieve Goals: Good Progress towards PT goals: Progressing toward goals    Frequency    Min 3X/week      PT Plan Current plan remains appropriate    Co-evaluation              AM-PAC PT "6 Clicks" Mobility   Outcome Measure  Help needed turning from your back to your side while in a flat bed without using bedrails?: A Little Help needed moving from lying on your back to sitting on the side of a flat bed without using bedrails?: A Little Help needed moving to and from a bed to a chair (including a wheelchair)?: A Little Help needed standing up from a chair using your arms (e.g., wheelchair or bedside chair)?: A Little Help needed to walk in hospital room?: A Little Help needed climbing 3-5 steps with a railing? : Total 6 Click Score: 16    End of Session Equipment Utilized During Treatment: Gait belt;Left knee immobilizer Activity Tolerance: Patient tolerated treatment well Patient left: in chair;with call bell/phone within reach;with chair alarm set;with family/visitor present Nurse Communication: Mobility status;Other (comment) (Pt with ice on knee, knee immobilizer to be retightened after ice removed. KI on at all times.) PT Visit Diagnosis: Difficulty in walking, not elsewhere classified (R26.2);Unsteadiness on feet (R26.81) Pain - Right/Left: Left Pain - part of body: Knee     Time: 1194-1740 PT Time Calculation (min) (ACUTE ONLY): 20 min  Charges:  $Therapeutic Activity: 8-22 mins                     Festus Barren PT, DPT   Acute Rehabilitation Services  Office [phone]    05/27/2022, 2:48 PM

## 2022-05-27 NOTE — Care Management Important Message (Signed)
Important Message  Patient Details IM Letter given to the Patient. Name: Caitlin Irwin MRN: [id number] Date of Birth: Sep 17, 1950   Medicare Important Message Given:  Yes     Kerin Salen 05/27/2022, 11:22 AM

## 2022-05-28 ENCOUNTER — Inpatient Hospital Stay (HOSPITAL_COMMUNITY): Payer: PPO

## 2022-05-28 DIAGNOSIS — E559 Vitamin D deficiency, unspecified: Secondary | ICD-10-CM | POA: Diagnosis not present

## 2022-05-28 DIAGNOSIS — D649 Anemia, unspecified: Secondary | ICD-10-CM | POA: Diagnosis not present

## 2022-05-28 DIAGNOSIS — Z743 Need for continuous supervision: Secondary | ICD-10-CM | POA: Diagnosis not present

## 2022-05-28 DIAGNOSIS — G8929 Other chronic pain: Secondary | ICD-10-CM | POA: Diagnosis not present

## 2022-05-28 DIAGNOSIS — G629 Polyneuropathy, unspecified: Secondary | ICD-10-CM | POA: Diagnosis not present

## 2022-05-28 DIAGNOSIS — S82142D Displaced bicondylar fracture of left tibia, subsequent encounter for closed fracture with routine healing: Secondary | ICD-10-CM | POA: Diagnosis not present

## 2022-05-28 DIAGNOSIS — E538 Deficiency of other specified B group vitamins: Secondary | ICD-10-CM | POA: Diagnosis not present

## 2022-05-28 DIAGNOSIS — R531 Weakness: Secondary | ICD-10-CM | POA: Diagnosis not present

## 2022-05-28 DIAGNOSIS — E876 Hypokalemia: Secondary | ICD-10-CM | POA: Diagnosis not present

## 2022-05-28 DIAGNOSIS — E46 Unspecified protein-calorie malnutrition: Secondary | ICD-10-CM | POA: Diagnosis not present

## 2022-05-28 DIAGNOSIS — M810 Age-related osteoporosis without current pathological fracture: Secondary | ICD-10-CM | POA: Diagnosis not present

## 2022-05-28 DIAGNOSIS — W19XXXD Unspecified fall, subsequent encounter: Secondary | ICD-10-CM | POA: Diagnosis not present

## 2022-05-28 DIAGNOSIS — D500 Iron deficiency anemia secondary to blood loss (chronic): Secondary | ICD-10-CM | POA: Diagnosis not present

## 2022-05-28 DIAGNOSIS — Z7901 Long term (current) use of anticoagulants: Secondary | ICD-10-CM | POA: Diagnosis not present

## 2022-05-28 DIAGNOSIS — K219 Gastro-esophageal reflux disease without esophagitis: Secondary | ICD-10-CM | POA: Diagnosis not present

## 2022-05-28 DIAGNOSIS — M25562 Pain in left knee: Secondary | ICD-10-CM | POA: Diagnosis not present

## 2022-05-28 DIAGNOSIS — M545 Low back pain, unspecified: Secondary | ICD-10-CM | POA: Diagnosis not present

## 2022-05-28 DIAGNOSIS — M5459 Other low back pain: Secondary | ICD-10-CM | POA: Diagnosis not present

## 2022-05-28 DIAGNOSIS — M79605 Pain in left leg: Secondary | ICD-10-CM | POA: Diagnosis not present

## 2022-05-28 DIAGNOSIS — S82142A Displaced bicondylar fracture of left tibia, initial encounter for closed fracture: Secondary | ICD-10-CM | POA: Diagnosis not present

## 2022-05-28 DIAGNOSIS — G43909 Migraine, unspecified, not intractable, without status migrainosus: Secondary | ICD-10-CM | POA: Diagnosis not present

## 2022-05-28 DIAGNOSIS — Z79899 Other long term (current) drug therapy: Secondary | ICD-10-CM | POA: Diagnosis not present

## 2022-05-28 DIAGNOSIS — S82202A Unspecified fracture of shaft of left tibia, initial encounter for closed fracture: Secondary | ICD-10-CM | POA: Diagnosis not present

## 2022-05-28 DIAGNOSIS — Z23 Encounter for immunization: Secondary | ICD-10-CM | POA: Diagnosis not present

## 2022-05-28 DIAGNOSIS — Z9889 Other specified postprocedural states: Secondary | ICD-10-CM | POA: Diagnosis not present

## 2022-05-28 DIAGNOSIS — E039 Hypothyroidism, unspecified: Secondary | ICD-10-CM | POA: Diagnosis not present

## 2022-05-28 DIAGNOSIS — D529 Folate deficiency anemia, unspecified: Secondary | ICD-10-CM | POA: Diagnosis not present

## 2022-05-28 DIAGNOSIS — F419 Anxiety disorder, unspecified: Secondary | ICD-10-CM | POA: Diagnosis not present

## 2022-05-28 DIAGNOSIS — Z4789 Encounter for other orthopedic aftercare: Secondary | ICD-10-CM | POA: Diagnosis not present

## 2022-05-28 DIAGNOSIS — F32A Depression, unspecified: Secondary | ICD-10-CM | POA: Diagnosis not present

## 2022-05-28 DIAGNOSIS — I10 Essential (primary) hypertension: Secondary | ICD-10-CM | POA: Diagnosis not present

## 2022-05-28 DIAGNOSIS — G43009 Migraine without aura, not intractable, without status migrainosus: Secondary | ICD-10-CM | POA: Diagnosis not present

## 2022-05-28 DIAGNOSIS — S82002A Unspecified fracture of left patella, initial encounter for closed fracture: Secondary | ICD-10-CM | POA: Diagnosis not present

## 2022-05-28 DIAGNOSIS — Z4889 Encounter for other specified surgical aftercare: Secondary | ICD-10-CM | POA: Diagnosis not present

## 2022-05-28 MED ORDER — FOLIC ACID 1 MG PO TABS: 1.0000 mg | ORAL_TABLET | Freq: Every day | ORAL | Status: AC

## 2022-05-28 MED ORDER — ALPRAZOLAM 0.5 MG PO TABS: 0.5000 mg | ORAL_TABLET | Freq: Every evening | ORAL | 0 refills | Status: AC | PRN

## 2022-05-28 NOTE — Discharge Summary (Signed)
DISCHARGE SUMMARY  Caitlin Irwin  MR#: [id number]  DOB:August 24, 1950  Date of Admission: 05/21/2022 Date of Discharge: 05/28/2022  Attending Physician:Shavaun Osterloh Hennie Duos, MD  Patient's XVQ:MGQQ, [name], MD  Consults: Orthopedic Surgery - Dr. Ileene Rubens  Disposition: D/C to SNF for rehab stay   Follow-up Appts:  Contact information for after-discharge care     Destination     [facility] Preferred SNF .   Service: Skilled Nursing Contact information: [place] [phone]                     Tests Needing Follow-up: -will need outpatient follow-up B12 level in 6-8 weeks to assure she is absorbing the B12 via oral dosing  -recheck of folic acid level in 6-8 weeks is suggested  -recheck of K level in 5-7 days is suggested   Discharge Diagnoses: Left tibial plateau fracture with suprapatellar effusion Chronic normocytic anemia + acute postoperative blood loss Hypokalemia Near syncope Folic acid deficiency Relative B12 deficiency HTN Hypothyroidism GERD with history of PUD  Initial presentation: 71 year old with a history of migraines, depression, anxiety, hypothyroidism, IBS, GERD, chronic anemia and chronic pain who suffered a fall at home after she became lightheaded and dizzy.  She landed on her left knee.  She did not lose consciousness.  She did not strike her head.  She had reported poor appetite and consumption of liquids in the days prior for no particular reason.  At presentation she is found to have a potassium of 2.5.  X-rays revealed a tibial plateau fracture on the left.  Hospital Course:  Left tibial plateau fracture with suprapatellar effusion Status post ORIF left bicondylar tibial plateau fracture -postoperative care per Orthopedics - placing in short-term SNF for rehab stay - Ortho recs are as follows:   -DVT ppx: aspirin '325mg'$  BID -Weight bearing status: non-weight  bearing left lower extremity with knee immobilizer on at all times -Dressing changed prior to discharge -Follow-up knee x-rays accomplished prior to discharge and reviewed by MD  -Readjust her knee immobilizer prn so it is centered over the knee -To be seen in orthopedic office 10 days post discharge   Chronic normocytic anemia -acute postoperative blood loss Baseline hemoglobin approximately 12 -postoperative hemoglobin 8.3 -no indication for transfusion during this admission - hemoglobin holding steady th/o postoperative period    Hypokalemia Due to simple poor intake - corrected with supplementation   Near syncope Did not suffer an actual syncopal episode -likely due to poor intake and exacerbated by severe hypokalemia - cortisol normal -TSH within normal range - no recurrence during admission    Folic acid deficiency Folate found to be 4.6 - likely due to poor intake - supplementing    Relative B12 deficiency B12 found to be low at 287 - likely due to poor intake - supplemented SQ during admission then changed to oral at d/c - will need outpatient follow-up in 6-8 weeks to assure she is absorbing the B12 via oral dosing    HTN Continue usual home blood pressure medications w/o change    Hypothyroidism Continue usual home Synthroid dose   GERD with history of PUD Continue PPI  Allergies as of 05/28/2022       Reactions   Acetaminophen Nausea And Vomiting        Medication List     STOP taking these medications    estradiol 1 MG tablet Commonly known as: ESTRACE   meclizine  25 MG tablet Commonly known as: ANTIVERT       TAKE these medications    ALPRAZolam 0.5 MG tablet Commonly known as: XANAX Take 1 tablet (0.5 mg total) by mouth at bedtime as needed for anxiety.   aspirin EC 325 MG tablet Take 1 tablet (325 mg total) by mouth in the morning and at bedtime for 20 days.   cyanocobalamin 1000 MCG tablet Commonly known as: VITAMIN B12 Take 1 tablet  (1,000 mcg total) by mouth daily.   FLUoxetine 10 MG capsule Commonly known as: PROZAC Take 10 mg by mouth daily.   folic acid 1 MG tablet Commonly known as: FOLVITE Take 1 tablet (1 mg total) by mouth daily.   gabapentin 100 MG capsule Commonly known as: NEURONTIN Take 100 mg by mouth 3 (three) times daily. What changed: Another medication with the same name was removed. Continue taking this medication, and follow the directions you see here.   levothyroxine 75 MCG tablet Commonly known as: SYNTHROID Take 75 mcg by mouth daily before breakfast.   multivitamin with minerals Tabs tablet Take 1 tablet by mouth daily.   oxyCODONE 5 MG immediate release tablet Commonly known as: Roxicodone Take 1-2 tablets (5-10 mg total) by mouth every 4 (four) hours as needed for up to 7 days for severe pain or moderate pain.   senna 8.6 MG Tabs tablet Commonly known as: SENOKOT Take 1 tablet (8.6 mg total) by mouth in the morning and at bedtime for 20 days.   spironolactone 25 MG tablet Commonly known as: ALDACTONE Take 25 mg by mouth daily.   SUMAtriptan 100 MG tablet Commonly known as: Imitrex Take 1 tablet (100 mg total) by mouth every 2 (two) hours as needed for migraine. May repeat in 2 hours if headache persists or recurs.   topiramate 50 MG tablet Commonly known as: TOPAMAX Take 50 mg by mouth 2 (two) times daily.        Day of Discharge BP (!) 152/63 (BP Location: Right Arm)   Pulse 71   Temp 97.8 F (36.6 C) (Oral)   Resp 19   Ht 4' 9.99" (1.473 m)   Wt 53.8 kg   SpO2 99%   BMI 24.80 kg/m   Physical Exam: General: No acute respiratory distress Lungs: Clear to auscultation bilaterally without wheezes or crackles Cardiovascular: Regular rate and rhythm without murmur gallop or rub normal S1 and S2 Abdomen: Nontender, nondistended, soft, bowel sounds positive, no rebound, no ascites, no appreciable mass Extremities: No significant cyanosis, clubbing, or edema  bilateral lower extremities  Basic Metabolic Panel: Recent Labs  Lab 05/21/22 2213 05/22/22 0525 05/23/22 0444 05/24/22 0451 05/25/22 0433 05/26/22 0535 05/27/22 0459  NA  --    < > 139 136 138 140 136  K  --    < > 3.0* 3.0* 3.2* 3.3* 4.3  CL  --    < > 109 107 106 113* 110  CO2  --    < > '23 22 25 24 23  '$ GLUCOSE  --    < > 107* 97 108* 98 110*  BUN  --    < > '9 8 9 13 17  '$ CREATININE  --    < > 0.79 0.76 0.80 0.83 1.02*  CALCIUM  --    < > 9.5 9.3 9.8 9.9 10.0  MG 1.9  --   --   --   --  2.0  --    < > = values in  this interval not displayed.   CBC: Recent Labs  Lab 05/21/22 1907 05/22/22 0525 05/23/22 0444 05/24/22 0451 05/25/22 0433 05/26/22 0535  WBC 12.2* 7.5 8.1 7.2 6.5 6.2  NEUTROABS 10.4*  --   --   --   --   --   HGB 12.6 10.6* 8.9* 8.3* 8.4* 8.5*  HCT 38.4 32.7* 27.2* 25.8* 25.3* 24.9*  MCV 89.9 90.6 89.8 91.5 88.8 88.3  PLT 304 236 211 169 207 258    Time spent in discharge (includes decision making & examination of pt): 35 minutes  05/28/2022, 9:58 AM   Cherene Altes, MD [facility] Office  [phone]

## 2022-05-28 NOTE — Progress Notes (Addendum)
HTA Auth approved.   Auth # for [facility] [place]  # - [id number]  Addend @ 9:49 AM This CSW spoke with April at [place]. April states they are ready to receive the pt. Dr. Thereasa Solo and RN notified. PTAR to transport.   Addend @ 10:10 AM  This CSW contacted the pt's neighbor, Thurmond Butts, to inform that the pt will be d/c to [facility] today.

## 2022-05-28 NOTE — Progress Notes (Signed)
Orthopedic Surgery Progress Note   Assessment: Patient is a 71 y.o. female with left tibial plateau fracture status post ORIF   Plan: -Operative plans: complete -XR of left knee reviewed  -Okay for diet from orthopedic perspective -DVT ppx: aspirin '325mg'$  BID -Antibiotics: ancef x2 post-op doses -Weight bearing status: non-weight bearing left lower extremity with knee immobilizer on at all times -PT/OT evaluate and treat -Pain control -Will want her to follow up in office 10 days from discharge  ___________________________________________________________________________  Subjective: No acute events overnight. Pain in left knee similar to yesterday. Interested in hearing about the long-term plan which was discussed with her this morning. No complaints this morning. Denies paresthesias and numbness.     Physical Exam:  General: no acute distress, appears stated age, laying in bed Neurologic: alert, answering questions appropriately, following commands Respiratory: unlabored breathing, symmetric chest rise Psychiatric: appropriate affect, normal cadence to speech  MSK:   -Left lower extremity  Dressings c/d/i, knee imoobilizer in place Fires hip flexors, quadriceps, hamstrings, tibialis anterior, gastrocnemius and soleus, extensor hallucis longus Plantarflexes and dorsiflexes toes Sensation intact to light touch in sural, saphenous, tibial, deep peroneal, and superficial peroneal nerve distributions Foot warm and well perfused, palpable dorsalis pedis pulse   XR of left knee reviewed showing slightly different views than intra-op palin films but reduction looks maintained and hardware in similar position   Patient name: Caitlin Irwin Patient MRN: [id number] Date: 05/28/22

## 2022-05-28 NOTE — TOC Transition Note (Signed)
Transition of Care [facility]) - CM/SW Discharge Note   Patient Details  Name: Caitlin Irwin MRN: [id number] Date of Birth: 02/13/1951  Transition of Care [facility]) CM/SW Contact:  Kimber Relic, LCSW Phone Number: 05/28/2022, 10:21 AM   Clinical Narrative:    Pt d/c to [facility] today, per April ([facility] staff) approval.  Pt's neighbor, Ryan, notified. D/c meds added to FL2. Updated FL2 printed to be sent with the pt. EDP and RN notified. RN provided report #. PTAR to transport. TOC signing off.    Final next level of care: [place] Barriers to Discharge: No Barriers Identified   Patient Goals and CMS Choice Patient states their goals for this hospitalization and ongoing recovery are::  (rehab)   Choice offered to / list presented to : Patient  Discharge Placement   Existing PASRR number confirmed : 05/24/22          Patient chooses bed at: [place], [place] Patient to be transferred to facility by: [facility] Name of family member notified: Regino Schultze; Attempted to call Jamas Lav as well, no answer. Patient and family notified of of transfer: 05/28/22  Discharge Plan and Services   Discharge Planning Services: CM Consult Post Acute Care Choice: [place]                               Social Determinants of Health (SDOH) Interventions     Readmission Risk Interventions     No data to display

## 2022-05-29 DIAGNOSIS — M810 Age-related osteoporosis without current pathological fracture: Secondary | ICD-10-CM | POA: Diagnosis not present

## 2022-05-29 DIAGNOSIS — G43009 Migraine without aura, not intractable, without status migrainosus: Secondary | ICD-10-CM | POA: Diagnosis not present

## 2022-05-29 DIAGNOSIS — I10 Essential (primary) hypertension: Secondary | ICD-10-CM | POA: Diagnosis not present

## 2022-05-29 DIAGNOSIS — D649 Anemia, unspecified: Secondary | ICD-10-CM | POA: Diagnosis not present

## 2022-05-29 DIAGNOSIS — M545 Low back pain, unspecified: Secondary | ICD-10-CM | POA: Diagnosis not present

## 2022-05-29 DIAGNOSIS — M5459 Other low back pain: Secondary | ICD-10-CM | POA: Diagnosis not present

## 2022-05-29 DIAGNOSIS — S82002A Unspecified fracture of left patella, initial encounter for closed fracture: Secondary | ICD-10-CM | POA: Diagnosis not present

## 2022-05-29 DIAGNOSIS — E039 Hypothyroidism, unspecified: Secondary | ICD-10-CM | POA: Diagnosis not present

## 2022-05-29 NOTE — Progress Notes (Signed)
Discharge report called to Tammy at [facility]. Awaiting PTAR for transport.Eulas Post, RN

## 2022-06-06 ENCOUNTER — Ambulatory Visit (INDEPENDENT_AMBULATORY_CARE_PROVIDER_SITE_OTHER): Payer: PPO | Admitting: Orthopedic Surgery

## 2022-06-06 ENCOUNTER — Ambulatory Visit: Payer: Self-pay

## 2022-06-06 DIAGNOSIS — M25562 Pain in left knee: Secondary | ICD-10-CM

## 2022-06-06 NOTE — Progress Notes (Signed)
Orthopedic Surgery Progress Note   Assessment: Patient is a 71 y.o. female with left tibial plateau fracture status post ORIF (05/22/2022 ~ 2 weeks from surgery)   Plan: -Continue non-weight bearing left lower extremity -Discontinue knee immobilizer use -Sutures removed in office today -Can start gentle active range of motion at the knee, but no passive. Instructed her to let pain be her guide when it comes to range of motion -Will plan to start more range of motion after our next visit -DVT ppx: aspirin '325mg'$  BID -Pain control -Follow up in 4 weeks (6 weeks from surgery with repeat XR)  ___________________________________________________________________________  Subjective: Is in rehab facility currently. Has been in her knee immobilizer and non-weight bearing. Her knee is sore but her pain continue to improve with time. She says she is not sure if she is taking anything for pain. Has not noticed any drainage from the wound. Denies paresthesias and numbness.    Physical Exam:  General: no acute distress, appears stated age Neurologic: alert, answering questions appropriately, following commands Respiratory: unlabored breathing on room air, symmetric chest rise Psychiatric: appropriate affect, normal cadence to speech  MSK:   -Left lower extremity  Incision appears well approximated with sutures in place. No active or expressible drainage. There was no drainage on the dressings from the hospital. No erythema around the wound.  Fires hip flexors, quadriceps, hamstrings, tibialis anterior, gastrocnemius and soleus, extensor hallucis longus Plantarflexes and dorsiflexes toes Sensation intact to light touch in sural, saphenous, tibial, deep peroneal, and superficial peroneal nerve distributions Foot warm and well perfused  XR of the left knee from 06/06/2022 were independently reviewed today showing fixation in similar position to prior XR in the hospital with no evidence of  complication. Fracture reduced with no interval displacement since last films. Alignment maintained.    Patient name: Caitlin Irwin Patient MRN: [id number] Date: 06/06/22

## 2022-06-19 DIAGNOSIS — S82202A Unspecified fracture of shaft of left tibia, initial encounter for closed fracture: Secondary | ICD-10-CM | POA: Diagnosis not present

## 2022-06-19 DIAGNOSIS — M810 Age-related osteoporosis without current pathological fracture: Secondary | ICD-10-CM | POA: Diagnosis not present

## 2022-06-19 DIAGNOSIS — E46 Unspecified protein-calorie malnutrition: Secondary | ICD-10-CM | POA: Diagnosis not present

## 2022-06-19 DIAGNOSIS — M79605 Pain in left leg: Secondary | ICD-10-CM | POA: Diagnosis not present

## 2022-06-29 ENCOUNTER — Other Ambulatory Visit: Payer: Self-pay | Admitting: Orthopedic Surgery

## 2022-06-30 ENCOUNTER — Telehealth: Payer: Self-pay | Admitting: Radiology

## 2022-06-30 NOTE — Telephone Encounter (Signed)
I called and advised Caitlin Irwin that she is 5 weeks postop and she needs to wean down

## 2022-07-04 ENCOUNTER — Ambulatory Visit (INDEPENDENT_AMBULATORY_CARE_PROVIDER_SITE_OTHER): Payer: PPO | Admitting: Orthopedic Surgery

## 2022-07-04 ENCOUNTER — Ambulatory Visit (INDEPENDENT_AMBULATORY_CARE_PROVIDER_SITE_OTHER): Payer: PPO

## 2022-07-04 DIAGNOSIS — M25562 Pain in left knee: Secondary | ICD-10-CM

## 2022-07-04 NOTE — Progress Notes (Signed)
Orthopedic Surgery Progress Note     Assessment: Patient is a 71 y.o. female with left tibial plateau fracture status post ORIF (05/22/2022 ~ 6 weeks from surgery)     Plan: -Start full active range of motion at the knee, no passive range of motion -Still NWB LLE -Provided her with some exercises to work on quad and hip flexor strengthening when in bed -Use OTC pain medication and voltaren gel for pain relief -Will transition to weight bearing as tolerated at next visit -DVT ppx: aspirin '325mg'$  BID -Follow up in 6 weeks (3 months from surgery) with AP/lateral left knee XR   ___________________________________________________________________________   Subjective: Has some soreness around the lateral aspect of her knee. It is worse with range of motion. Does not have pain with range of motion through the knee. Denies paresthesias and numbness. No redness or drainage around the incision.      Physical Exam:   General: no acute distress, appears stated age Neurologic: alert, answering questions appropriately, following commands Respiratory: unlabored breathing on room air, symmetric chest rise Psychiatric: appropriate affect, normal cadence to speech   MSK:    -Left lower extremity             Incision is well healed with no evidence of infection  Knee ROM from 5-90 actively, 0-100 passively Fires hip flexors, quadriceps, hamstrings, tibialis anterior, gastrocnemius and soleus, extensor hallucis longus Plantarflexes and dorsiflexes toes Sensation intact to light touch in sural, saphenous, tibial, deep peroneal, and superficial peroneal nerve distributions Foot warm and well perfused   XR of the left knee from 07/04/2022 were independently reviewed today showing fixation in similar position to prior XR in the hospital with no evidence of complication. Fracture reduced with no displacement since last films. Interval healing seen with less distinct fracture edges. Alignment maintained.       Patient name: Caitlin Irwin Patient MRN: [id number] Date: 07/04/22

## 2022-07-06 ENCOUNTER — Telehealth: Payer: Self-pay | Admitting: Orthopedic Surgery

## 2022-07-06 NOTE — Telephone Encounter (Signed)
Patient relative called in stating she wanted to discuss her PT and OT situation, patient  was ordered to only have OT and not PT and now she is refusing all Therapy now because she is confused on what the difference is and she understood no therapy instead on no PT, patient relative states she needs OT being she needs to learn how to navigate better on her walker being she has stairs in her home and outside the home, she would like a letter sent over to the patient and notice sent to April her nurse at [facility] stating that she need the OT and what it will help with being she is not understanding when it is being told to her verbally. The relative is  Goland,Marlene (Relative) [phone] [facility])  and she would like a call updating her on what is going on and April for assisted living cell phone number is [phone].

## 2022-07-11 ENCOUNTER — Telehealth: Payer: Self-pay | Admitting: Orthopedic Surgery

## 2022-07-11 NOTE — Telephone Encounter (Signed)
Patient states she is in a lot of pain and would like to be advise on what to do. Best contact [phone]

## 2022-07-11 NOTE — Telephone Encounter (Signed)
I called and spoke with Sonia Side they have the necessary orders and he will put Dr. Laurance Flatten as referring MD since he did her surgery.

## 2022-07-11 NOTE — Telephone Encounter (Signed)
Sonia Side from [place] came by. Needs verbal orders for home PT. [phone]

## 2022-07-12 DIAGNOSIS — F32A Depression, unspecified: Secondary | ICD-10-CM | POA: Diagnosis not present

## 2022-07-12 DIAGNOSIS — K589 Irritable bowel syndrome without diarrhea: Secondary | ICD-10-CM | POA: Diagnosis not present

## 2022-07-12 DIAGNOSIS — Z9181 History of falling: Secondary | ICD-10-CM | POA: Diagnosis not present

## 2022-07-12 DIAGNOSIS — G894 Chronic pain syndrome: Secondary | ICD-10-CM | POA: Diagnosis not present

## 2022-07-12 DIAGNOSIS — F419 Anxiety disorder, unspecified: Secondary | ICD-10-CM | POA: Diagnosis not present

## 2022-07-12 DIAGNOSIS — I10 Essential (primary) hypertension: Secondary | ICD-10-CM | POA: Diagnosis not present

## 2022-07-12 DIAGNOSIS — D529 Folate deficiency anemia, unspecified: Secondary | ICD-10-CM | POA: Diagnosis not present

## 2022-07-12 DIAGNOSIS — G43909 Migraine, unspecified, not intractable, without status migrainosus: Secondary | ICD-10-CM | POA: Diagnosis not present

## 2022-07-12 DIAGNOSIS — Z79891 Long term (current) use of opiate analgesic: Secondary | ICD-10-CM | POA: Diagnosis not present

## 2022-07-12 DIAGNOSIS — K219 Gastro-esophageal reflux disease without esophagitis: Secondary | ICD-10-CM | POA: Diagnosis not present

## 2022-07-12 DIAGNOSIS — Z981 Arthrodesis status: Secondary | ICD-10-CM | POA: Diagnosis not present

## 2022-07-12 DIAGNOSIS — M545 Low back pain, unspecified: Secondary | ICD-10-CM | POA: Diagnosis not present

## 2022-07-12 DIAGNOSIS — G500 Trigeminal neuralgia: Secondary | ICD-10-CM | POA: Diagnosis not present

## 2022-07-12 DIAGNOSIS — E039 Hypothyroidism, unspecified: Secondary | ICD-10-CM | POA: Diagnosis not present

## 2022-07-12 DIAGNOSIS — M19011 Primary osteoarthritis, right shoulder: Secondary | ICD-10-CM | POA: Diagnosis not present

## 2022-07-12 DIAGNOSIS — Z791 Long term (current) use of non-steroidal anti-inflammatories (NSAID): Secondary | ICD-10-CM | POA: Diagnosis not present

## 2022-07-12 DIAGNOSIS — M80061D Age-related osteoporosis with current pathological fracture, right lower leg, subsequent encounter for fracture with routine healing: Secondary | ICD-10-CM | POA: Diagnosis not present

## 2022-07-12 DIAGNOSIS — M1711 Unilateral primary osteoarthritis, right knee: Secondary | ICD-10-CM | POA: Diagnosis not present

## 2022-07-12 NOTE — Telephone Encounter (Signed)
I called and lmom advising her of Dr. Tawanna Sat response

## 2022-07-18 ENCOUNTER — Telehealth: Payer: Self-pay | Admitting: Orthopedic Surgery

## 2022-07-18 NOTE — Telephone Encounter (Signed)
Received call from [facility] (OT) with [place] advised she did OT eval only because patient is in a assistant living facility and does not need (OT) now. Patient is doing well. Malori said she is discharging patient.  Malori said patient will need (OT) when she goes home.   [facility] advised need an order for a rolling walker  The number to contact [facility] is [phone]

## 2022-07-19 ENCOUNTER — Telehealth: Payer: Self-pay | Admitting: *Deleted

## 2022-07-19 NOTE — Telephone Encounter (Signed)
Spoke with patient and confirmed her current location is [facility]. Spoke with staff to inform that CM would be ordering a RW per Dr. Laurance Flatten through [facility]. Ordered walker through [employer] to be delivered to facility ASAP.

## 2022-07-20 ENCOUNTER — Telehealth: Payer: Self-pay | Admitting: Orthopedic Surgery

## 2022-07-20 DIAGNOSIS — S82142D Displaced bicondylar fracture of left tibia, subsequent encounter for closed fracture with routine healing: Secondary | ICD-10-CM

## 2022-07-20 NOTE — Addendum Note (Signed)
Addended by: Ileene Rubens on: 07/20/2022 05:17 PM   Modules accepted: Orders

## 2022-07-20 NOTE — Telephone Encounter (Addendum)
Orthopedic Telephone Note  Patient called the office today to talk about her progress and her assisted living facility.  Patient states that she is not getting any physical therapy and feels like she is not getting much help at the assisted living facility.  She is frustrated and would like to go home.  She has been working on range of motion on her own and can move her knee fully. Still has some soreness in the knee but otherwise feels the knee is doing well. She states that she has enough help to get her through the recovery period.  She knows that I am probably going to let her weight-bear as tolerated on the left lower extremity in a month assuming continued healing.  She has friends and family nearby that would be able to help her through that month.  I explained that the biggest limitation to going home is her safety.  I said that I do not want her to have any further falls. I also want her to be able to take care of herself. I told her that she is not on a medical hold so if she would like to, she can leave the assisted living facility.  She is in control of her body and has capacity to make her own medical decision. I can order home physical therapy since I do think she would benefit from physical therapy to work on range of motion and strengthening around the knee. I would normally have a patient working with physical therapy at this time as a part of their recovery. She is still toe-touch weightbearing on the left lower extremity.  She needs to be able to follow that restriction at home.  She states she is going to sleep on the lower floor on the couch and can do everything she needs to do with a wheelchair, a walker, and with assistance form her friends. In her current state, she would be unable to leave the house so I ordered home physical therapy for her today.  If there are any issues, she should call the office and we can talk further.  Ileene Rubens, MD Orthopedic surgeon

## 2022-07-20 NOTE — Telephone Encounter (Signed)
Patient states she wants to go home and and have PT at home. Please call..[phone]

## 2022-07-27 DIAGNOSIS — I10 Essential (primary) hypertension: Secondary | ICD-10-CM | POA: Diagnosis not present

## 2022-07-27 DIAGNOSIS — G43909 Migraine, unspecified, not intractable, without status migrainosus: Secondary | ICD-10-CM | POA: Diagnosis not present

## 2022-07-27 DIAGNOSIS — Z981 Arthrodesis status: Secondary | ICD-10-CM | POA: Diagnosis not present

## 2022-07-27 DIAGNOSIS — M545 Low back pain, unspecified: Secondary | ICD-10-CM | POA: Diagnosis not present

## 2022-07-27 DIAGNOSIS — F419 Anxiety disorder, unspecified: Secondary | ICD-10-CM | POA: Diagnosis not present

## 2022-07-27 DIAGNOSIS — K589 Irritable bowel syndrome without diarrhea: Secondary | ICD-10-CM | POA: Diagnosis not present

## 2022-07-27 DIAGNOSIS — Z791 Long term (current) use of non-steroidal anti-inflammatories (NSAID): Secondary | ICD-10-CM | POA: Diagnosis not present

## 2022-07-27 DIAGNOSIS — G500 Trigeminal neuralgia: Secondary | ICD-10-CM | POA: Diagnosis not present

## 2022-07-27 DIAGNOSIS — M1711 Unilateral primary osteoarthritis, right knee: Secondary | ICD-10-CM | POA: Diagnosis not present

## 2022-07-27 DIAGNOSIS — M80061D Age-related osteoporosis with current pathological fracture, right lower leg, subsequent encounter for fracture with routine healing: Secondary | ICD-10-CM | POA: Diagnosis not present

## 2022-07-27 DIAGNOSIS — E039 Hypothyroidism, unspecified: Secondary | ICD-10-CM | POA: Diagnosis not present

## 2022-07-27 DIAGNOSIS — F32A Depression, unspecified: Secondary | ICD-10-CM | POA: Diagnosis not present

## 2022-07-27 DIAGNOSIS — M19011 Primary osteoarthritis, right shoulder: Secondary | ICD-10-CM | POA: Diagnosis not present

## 2022-07-27 DIAGNOSIS — Z9181 History of falling: Secondary | ICD-10-CM | POA: Diagnosis not present

## 2022-07-27 DIAGNOSIS — K219 Gastro-esophageal reflux disease without esophagitis: Secondary | ICD-10-CM | POA: Diagnosis not present

## 2022-07-27 DIAGNOSIS — Z79891 Long term (current) use of opiate analgesic: Secondary | ICD-10-CM | POA: Diagnosis not present

## 2022-07-27 DIAGNOSIS — G894 Chronic pain syndrome: Secondary | ICD-10-CM | POA: Diagnosis not present

## 2022-07-27 DIAGNOSIS — D529 Folate deficiency anemia, unspecified: Secondary | ICD-10-CM | POA: Diagnosis not present

## 2022-07-29 ENCOUNTER — Other Ambulatory Visit: Payer: Self-pay

## 2022-07-29 ENCOUNTER — Inpatient Hospital Stay (HOSPITAL_COMMUNITY)
Admission: EM | Admit: 2022-07-29 | Discharge: 2022-07-31 | DRG: 377 | Disposition: A | Discharge status: Nursing Home (Includes ICF) | Payer: PPO | Source: Skilled Nursing Facility | Attending: Internal Medicine | Admitting: Internal Medicine

## 2022-07-29 ENCOUNTER — Encounter (HOSPITAL_COMMUNITY): Payer: Self-pay

## 2022-07-29 ENCOUNTER — Emergency Department (HOSPITAL_COMMUNITY): Payer: PPO

## 2022-07-29 DIAGNOSIS — Z833 Family history of diabetes mellitus: Secondary | ICD-10-CM | POA: Diagnosis not present

## 2022-07-29 DIAGNOSIS — Z923 Personal history of irradiation: Secondary | ICD-10-CM

## 2022-07-29 DIAGNOSIS — E039 Hypothyroidism, unspecified: Secondary | ICD-10-CM | POA: Diagnosis not present

## 2022-07-29 DIAGNOSIS — F418 Other specified anxiety disorders: Secondary | ICD-10-CM | POA: Diagnosis not present

## 2022-07-29 DIAGNOSIS — E876 Hypokalemia: Secondary | ICD-10-CM | POA: Diagnosis not present

## 2022-07-29 DIAGNOSIS — K250 Acute gastric ulcer with hemorrhage: Principal | ICD-10-CM

## 2022-07-29 DIAGNOSIS — Z79890 Hormone replacement therapy (postmenopausal): Secondary | ICD-10-CM

## 2022-07-29 DIAGNOSIS — K449 Diaphragmatic hernia without obstruction or gangrene: Secondary | ICD-10-CM

## 2022-07-29 DIAGNOSIS — T39395A Adverse effect of other nonsteroidal anti-inflammatory drugs [NSAID], initial encounter: Secondary | ICD-10-CM | POA: Diagnosis not present

## 2022-07-29 DIAGNOSIS — R531 Weakness: Secondary | ICD-10-CM | POA: Diagnosis not present

## 2022-07-29 DIAGNOSIS — Z79899 Other long term (current) drug therapy: Secondary | ICD-10-CM

## 2022-07-29 DIAGNOSIS — G43909 Migraine, unspecified, not intractable, without status migrainosus: Secondary | ICD-10-CM | POA: Diagnosis not present

## 2022-07-29 DIAGNOSIS — F32A Depression, unspecified: Secondary | ICD-10-CM | POA: Diagnosis present

## 2022-07-29 DIAGNOSIS — F419 Anxiety disorder, unspecified: Secondary | ICD-10-CM | POA: Diagnosis present

## 2022-07-29 DIAGNOSIS — I10 Essential (primary) hypertension: Secondary | ICD-10-CM | POA: Diagnosis present

## 2022-07-29 DIAGNOSIS — K219 Gastro-esophageal reflux disease without esophagitis: Secondary | ICD-10-CM | POA: Diagnosis not present

## 2022-07-29 DIAGNOSIS — R109 Unspecified abdominal pain: Secondary | ICD-10-CM | POA: Diagnosis not present

## 2022-07-29 DIAGNOSIS — K319 Disease of stomach and duodenum, unspecified: Secondary | ICD-10-CM | POA: Diagnosis not present

## 2022-07-29 DIAGNOSIS — G9341 Metabolic encephalopathy: Secondary | ICD-10-CM | POA: Diagnosis present

## 2022-07-29 DIAGNOSIS — R58 Hemorrhage, not elsewhere classified: Secondary | ICD-10-CM | POA: Diagnosis not present

## 2022-07-29 DIAGNOSIS — R1013 Epigastric pain: Secondary | ICD-10-CM | POA: Diagnosis not present

## 2022-07-29 DIAGNOSIS — K259 Gastric ulcer, unspecified as acute or chronic, without hemorrhage or perforation: Secondary | ICD-10-CM | POA: Diagnosis not present

## 2022-07-29 DIAGNOSIS — Z7401 Bed confinement status: Secondary | ICD-10-CM | POA: Diagnosis not present

## 2022-07-29 DIAGNOSIS — G500 Trigeminal neuralgia: Secondary | ICD-10-CM | POA: Diagnosis present

## 2022-07-29 DIAGNOSIS — E7800 Pure hypercholesterolemia, unspecified: Secondary | ICD-10-CM | POA: Diagnosis present

## 2022-07-29 DIAGNOSIS — R1084 Generalized abdominal pain: Secondary | ICD-10-CM | POA: Diagnosis not present

## 2022-07-29 DIAGNOSIS — D62 Acute posthemorrhagic anemia: Secondary | ICD-10-CM

## 2022-07-29 DIAGNOSIS — M810 Age-related osteoporosis without current pathological fracture: Secondary | ICD-10-CM | POA: Diagnosis not present

## 2022-07-29 DIAGNOSIS — K2951 Unspecified chronic gastritis with bleeding: Secondary | ICD-10-CM | POA: Diagnosis not present

## 2022-07-29 DIAGNOSIS — K222 Esophageal obstruction: Secondary | ICD-10-CM

## 2022-07-29 DIAGNOSIS — R9431 Abnormal electrocardiogram [ECG] [EKG]: Secondary | ICD-10-CM | POA: Diagnosis not present

## 2022-07-29 DIAGNOSIS — K920 Hematemesis: Secondary | ICD-10-CM | POA: Diagnosis not present

## 2022-07-29 DIAGNOSIS — Z781 Physical restraint status: Secondary | ICD-10-CM

## 2022-07-29 DIAGNOSIS — Z8041 Family history of malignant neoplasm of ovary: Secondary | ICD-10-CM | POA: Diagnosis not present

## 2022-07-29 DIAGNOSIS — Z886 Allergy status to analgesic agent status: Secondary | ICD-10-CM

## 2022-07-29 DIAGNOSIS — Z981 Arthrodesis status: Secondary | ICD-10-CM | POA: Diagnosis not present

## 2022-07-29 DIAGNOSIS — R1111 Vomiting without nausea: Secondary | ICD-10-CM | POA: Diagnosis not present

## 2022-07-29 DIAGNOSIS — N281 Cyst of kidney, acquired: Secondary | ICD-10-CM | POA: Diagnosis not present

## 2022-07-29 DIAGNOSIS — Z8249 Family history of ischemic heart disease and other diseases of the circulatory system: Secondary | ICD-10-CM | POA: Diagnosis not present

## 2022-07-29 DIAGNOSIS — R457 State of emotional shock and stress, unspecified: Secondary | ICD-10-CM | POA: Diagnosis not present

## 2022-07-29 LAB — CBC WITH DIFFERENTIAL/PLATELET
Abs Immature Granulocytes: 0.03 10*3/uL (ref 0.00–0.07)
Basophils Absolute: 0.1 10*3/uL (ref 0.0–0.1)
Basophils Relative: 1 %
Eosinophils Absolute: 0.1 10*3/uL (ref 0.0–0.5)
Eosinophils Relative: 1 %
HCT: 32.5 % — ABNORMAL LOW (ref 36.0–46.0)
Hemoglobin: 10.8 g/dL — ABNORMAL LOW (ref 12.0–15.0)
Immature Granulocytes: 0 %
Lymphocytes Relative: 16 %
Lymphs Abs: 1.8 10*3/uL (ref 0.7–4.0)
MCH: 29.2 pg (ref 26.0–34.0)
MCHC: 33.2 g/dL (ref 30.0–36.0)
MCV: 87.8 fL (ref 80.0–100.0)
Monocytes Absolute: 0.8 10*3/uL (ref 0.1–1.0)
Monocytes Relative: 7 %
Neutro Abs: 8.2 10*3/uL — ABNORMAL HIGH (ref 1.7–7.7)
Neutrophils Relative %: 75 %
Platelets: 332 10*3/uL (ref 150–400)
RBC: 3.7 MIL/uL — ABNORMAL LOW (ref 3.87–5.11)
RDW: 13.1 % (ref 11.5–15.5)
WBC: 11 10*3/uL — ABNORMAL HIGH (ref 4.0–10.5)
nRBC: 0 % (ref 0.0–0.2)

## 2022-07-29 LAB — COMPREHENSIVE METABOLIC PANEL
ALT: 13 U/L (ref 0–44)
AST: 13 U/L — ABNORMAL LOW (ref 15–41)
Albumin: 3.4 g/dL — ABNORMAL LOW (ref 3.5–5.0)
Alkaline Phosphatase: 71 U/L (ref 38–126)
Anion gap: 6 (ref 5–15)
BUN: 37 mg/dL — ABNORMAL HIGH (ref 8–23)
CO2: 20 mmol/L — ABNORMAL LOW (ref 22–32)
Calcium: 10.7 mg/dL — ABNORMAL HIGH (ref 8.9–10.3)
Chloride: 114 mmol/L — ABNORMAL HIGH (ref 98–111)
Creatinine, Ser: 0.93 mg/dL (ref 0.44–1.00)
GFR, Estimated: >60 mL/min (ref >60)
Glucose, Bld: 117 mg/dL — ABNORMAL HIGH (ref 70–99)
Potassium: 3 mmol/L — ABNORMAL LOW (ref 3.5–5.1)
Sodium: 140 mmol/L (ref 135–145)
Total Bilirubin: 0.5 mg/dL (ref 0.3–1.2)
Total Protein: 6.7 g/dL (ref 6.5–8.1)

## 2022-07-29 LAB — SAMPLE TO BLOOD BANK

## 2022-07-29 LAB — LIPASE, BLOOD: Lipase: 38 U/L (ref 11–51)

## 2022-07-29 LAB — MAGNESIUM: Magnesium: 1.8 mg/dL (ref 1.7–2.4)

## 2022-07-29 MED ORDER — POTASSIUM CHLORIDE CRYS ER 20 MEQ PO TBCR
40.0000 meq | EXTENDED_RELEASE_TABLET | Freq: Once | ORAL | Status: AC
  Administered 2022-07-29: 40 meq via ORAL
  Filled 2022-07-29: qty 2

## 2022-07-29 MED ORDER — PANTOPRAZOLE 80MG IVPB - SIMPLE MED
80.0000 mg | Freq: Once | INTRAVENOUS | Status: AC
  Administered 2022-07-29: 80 mg via INTRAVENOUS
  Filled 2022-07-29: qty 80

## 2022-07-29 MED ORDER — FAMOTIDINE IN NACL 20-0.9 MG/50ML-% IV SOLN
20.0000 mg | Freq: Once | INTRAVENOUS | Status: AC
  Administered 2022-07-29: 20 mg via INTRAVENOUS
  Filled 2022-07-29: qty 50

## 2022-07-29 MED ORDER — PANTOPRAZOLE INFUSION (NEW) - SIMPLE MED
8.0000 mg/h | INTRAVENOUS | Status: DC
  Administered 2022-07-29 – 2022-07-31 (×4): 8 mg/h via INTRAVENOUS
  Filled 2022-07-29: qty 100
  Filled 2022-07-29: qty 80
  Filled 2022-07-29: qty 100
  Filled 2022-07-29: qty 80
  Filled 2022-07-29: qty 100
  Filled 2022-07-29 (×2): qty 80

## 2022-07-29 MED ORDER — POTASSIUM CHLORIDE 2 MEQ/ML IV SOLN
INTRAVENOUS | Status: AC
  Filled 2022-07-29 (×2): qty 1000

## 2022-07-29 MED ORDER — IOHEXOL 300 MG/ML  SOLN
100.0000 mL | Freq: Once | INTRAMUSCULAR | Status: AC | PRN
  Administered 2022-07-29: 100 mL via INTRAVENOUS

## 2022-07-29 MED ORDER — POTASSIUM CHLORIDE 10 MEQ/100ML IV SOLN
10.0000 meq | INTRAVENOUS | Status: AC
  Administered 2022-07-29 (×2): 10 meq via INTRAVENOUS
  Filled 2022-07-29 (×2): qty 100

## 2022-07-29 MED ORDER — ONDANSETRON HCL 4 MG PO TABS: 4.0000 mg | ORAL_TABLET | Freq: Four times a day (QID) | ORAL | Status: DC | PRN

## 2022-07-29 MED ORDER — ONDANSETRON HCL 4 MG/2ML IJ SOLN: 4.0000 mg | Freq: Four times a day (QID) | INTRAMUSCULAR | Status: DC | PRN

## 2022-07-29 MED ORDER — ONDANSETRON HCL 4 MG/2ML IJ SOLN
4.0000 mg | Freq: Once | INTRAMUSCULAR | Status: AC
  Administered 2022-07-29: 4 mg via INTRAVENOUS
  Filled 2022-07-29: qty 2

## 2022-07-29 MED ORDER — SODIUM CHLORIDE 0.9% FLUSH
3.0000 mL | Freq: Two times a day (BID) | INTRAVENOUS | Status: DC
  Administered 2022-07-30 – 2022-07-31 (×3): 3 mL via INTRAVENOUS

## 2022-07-29 MED ORDER — SODIUM CHLORIDE 0.9 % IV BOLUS
1000.0000 mL | Freq: Once | INTRAVENOUS | Status: AC
  Administered 2022-07-29: 1000 mL via INTRAVENOUS

## 2022-07-29 NOTE — H&P (Signed)
History and Physical    AVORY RAHIMI XBM:[id number] DOB: 07-14-1951 DOA: 07/29/2022  PCP: Michael Boston, MD  Patient coming from: Home  I have personally briefly reviewed patient's old medical records in [place]  Chief Complaint: Hematemesis  HPI: SIRIAH TREAT is a 71 y.o. female with medical history significant for peptic ulcer disease, HTN, trigeminal neuralgia, hypothyroidism, HLD, anemia (B12 and folate deficiency), depression/anxiety who presented to the ED for evaluation of hematemesis.  Patient currently residing at [facility] after she underwent ORIF for left tibial plateau fracture on 05/22/2022.  She reports having some abdominal pain with nausea vomiting earlier today.  She did not see her emesis looked like but staff noted bloody content therefore she was sent to the ED for further evaluation.  Patient has a history of gastric ulcer on prior endoscopy in 2013.  She is not currently on PPI.  She states that she does take ibuprofen daily for migraines.  She was on aspirin 325 mg BID for VTE prophylaxis after her tibial fracture ORIF but is now off of aspirin.  She denies any lightheadedness, dizziness, chest pain, dyspnea, dysuria, hematochezia, melena.  ED Course  Labs/Imaging on admission: I have personally reviewed following labs and imaging studies.  Initial vitals showed BP 105/74, pulse 101, RR 20, temp 97.7 F, SpO2 99% on room air.  Labs show WBC 11.0, hemoglobin 10.8, platelets 332,000, sodium 140, potassium 3.0, magnesium 1.8, bicarb 20, BUN 37, creatinine 0.93, serum glucose 117, lipase 38.  Type and screen in process.  CT abdomen/pelvis with contrast shows changes consistent with active GI bleeding in the distal stomach which may be related to a bleeding gastric ulcer.  Patient was given 1 L normal saline, IV Pepcid 20 mg, IV Protonix 80 mg followed by continuous infusion, IV K 10 mEq x 2 and 40 mEq orally.  EDP discussed with GI, Dr.  Henrene Pastor, who recommended continuing current management, n.p.o. after midnight, possible endoscopy tomorrow.  The hospitalist service was consulted to admit for further evaluation and management.  Review of Systems: All systems reviewed and are negative except as documented in history of present illness above.   Past Medical History:  Diagnosis Date   Accidental poisoning by aspirin 09/2014   Anemia    Anxiety    Arthritis    "right knee; right shoulder" (09/15/2015)   Ataxia 03/24/2014   Cataract    both eyes   Chronic lower back pain    scoliosis   Depression    GERD (gastroesophageal reflux disease)    H/O hiatal hernia    High cholesterol    History of blood transfusion    "related to OR"   Hypersomnia    IBS (irritable bowel syndrome)    Migraines    "maybe once/week now" (09/15/2015)   Osteoporosis    Other constipation    Parotid tumor    PONV (postoperative nausea and vomiting)    hx of " getting too much" - 1998, slow to take up by body    PUD (peptic ulcer disease)    Trigeminal neuralgia    S/P radiation therapy    Unspecified hypothyroidism     Past Surgical History:  Procedure Laterality Date   ABDOMINAL HYSTERECTOMY     ANKLE FRACTURE SURGERY Left    ANTERIOR LUMBAR FUSION  04/2010   L4-5; L5-S1   BACK SURGERY     BRAIN SURGERY     parotid tumor removed  CHOLECYSTECTOMY OPEN     EPIGASTRIC HERNIA REPAIR  09/15/2015   Procedure: OPEN HERNIA REPAIR EPIGASTRIC ADULT;  Surgeon: Greer Pickerel, MD;  Location: [facility];  Service: General;;   ESOPHAGOGASTRODUODENOSCOPY  06/12/2012   Procedure: ESOPHAGOGASTRODUODENOSCOPY (EGD);  Surgeon: Lear Ng, MD;  Location: [facility];  Service: Endoscopy;  Laterality: N/A;   EXTERNAL FIXATION LEG Right 11/14/2018   Procedure: EXTERNAL FIXATION LEG;  Surgeon: Hiram Gash, MD;  Location: [facility];  Service: Orthopedics;  Laterality: Right;   FRACTURE SURGERY     gamma knife for trig neuralgia  12/27/11   HERNIA REPAIR      KNEE CARTILAGE SURGERY Right 1981   "converted from scope to open during the OR; ligament repair"   LAMINECTOMY AND MICRODISCECTOMY LUMBAR SPINE  1980   L4-5   LAPAROSCOPIC EPIGASTRIC HERNIA REPAIR  09/15/2015   open   ORIF TIBIA PLATEAU Right 11/16/2018   Procedure: OPEN REDUCTION INTERNAL FIXATION (ORIF) TIBIAL PLATEAU;  Surgeon: Hiram Gash, MD;  Location: [facility];  Service: Orthopedics;  Laterality: Right;   ORIF TIBIA PLATEAU Left 05/22/2022   Procedure: OPEN REDUCTION INTERNAL FIXATION (ORIF) TIBIAL PLATEAU;  Surgeon: Callie Fielding, MD;  Location: [facility];  Service: Orthopedics;  Laterality: Left;   PAROTID GLAND TUMOR EXCISION     benign   SHOULDER ARTHROSCOPY W/ ROTATOR CUFF REPAIR Right 11-2014   TRIGEMINAL NERVE DECOMPRESSION     to stop migraines and it did not work     Social History:  reports that she has never smoked. She has never used smokeless tobacco. She reports that she does not drink alcohol and does not use drugs.  Allergies  Allergen Reactions   Acetaminophen Nausea And Vomiting    Family History  Problem Relation Age of Onset   Ovarian cancer Mother    Migraines Mother    Diabetes Father    Migraines Brother    Heart disease Other    Heart disease Maternal Grandfather    Colon cancer Neg Hx    Esophageal cancer Neg Hx      Prior to Admission medications   Medication Sig Start Date End Date Taking? Authorizing Provider  ALPRAZolam Duanne Moron) 0.5 MG tablet Take 1 tablet (0.5 mg total) by mouth at bedtime as needed for anxiety. 05/28/22   Cherene Altes, MD  FLUoxetine (PROZAC) 10 MG capsule Take 10 mg by mouth daily. 05/06/22   [provider]  folic acid (FOLVITE) 1 MG tablet Take 1 tablet (1 mg total) by mouth daily. 05/28/22   Cherene Altes, MD  gabapentin (NEURONTIN) 100 MG capsule Take 100 mg by mouth 3 (three) times daily.    [provider]  levothyroxine (SYNTHROID, LEVOTHROID) 75 MCG tablet Take 75 mcg by mouth daily  before breakfast.  04/09/17   [provider]  Multiple Vitamin (MULTIVITAMIN WITH MINERALS) TABS tablet Take 1 tablet by mouth daily.    [provider]  spironolactone (ALDACTONE) 25 MG tablet Take 25 mg by mouth daily. 05/06/22   [provider]  SUMAtriptan (IMITREX) 100 MG tablet Take 1 tablet (100 mg total) by mouth every 2 (two) hours as needed for migraine. May repeat in 2 hours if headache persists or recurs. 06/09/19   Tegeler, Gwenyth Allegra, MD  topiramate (TOPAMAX) 50 MG tablet Take 50 mg by mouth 2 (two) times daily.    [provider]  vitamin B-12 (CYANOCOBALAMIN) 1000 MCG tablet Take 1 tablet (1,000 mcg total) by mouth  daily. 06/27/21   Oswald Hillock, MD  eletriptan (RELPAX) 40 MG tablet One tablet by mouth as needed for migraine headache.  If the headache improves and then returns, dose may be repeated after 2 hours have elapsed since first dose (do not exceed 80 mg per day). may repeat in 2 hours if necessary  10/20/11  [provider]  ferrous sulfate 325 (65 FE) MG tablet Take 325 mg by mouth daily with breakfast.  10/20/11  [provider]    Physical Exam: Vitals:   07/29/22 2028 07/29/22 2045 07/29/22 2222  BP: 120/70 105/74 (!) 141/82  Pulse: 97 99 (!) 124  Resp: 18 20 (!) 24  Temp: 97.7 F (36.5 C)    TempSrc: Oral    SpO2: 99% 99% 100%   Constitutional: Resting in bed, NAD, calm, comfortable Eyes: EOMI, lids and conjunctivae normal ENMT: Mucous membranes are moist. Posterior pharynx clear of any exudate or lesions.Normal dentition.  Neck: normal, supple, no masses. Respiratory: clear to auscultation bilaterally, no wheezing, no crackles. Normal respiratory effort. No accessory muscle use.  Cardiovascular: Regular rate and rhythm, no murmurs / rubs / gallops. No extremity edema. 2+ pedal pulses. Abdomen: Mild epigastric tenderness, no masses palpated. No hepatosplenomegaly.  Musculoskeletal: no clubbing / cyanosis.  No joint deformity upper and lower extremities. Good ROM, no contractures. Normal muscle tone.  Skin: no rashes, lesions, ulcers. No induration Neurologic: Sensation intact. Strength 5/5 in all 4.  Psychiatric: Normal judgment and insight. Alert and oriented x 3. Normal mood.   EKG: Personally reviewed. Sinus rhythm, rate 99, LAFB.  Rate is faster when compared to prior.  Assessment/Plan Principal Problem:   Acute gastric ulcer with hemorrhage Active Problems:   Hypokalemia   Hypothyroidism   Depression with anxiety   Essential hypertension   EVALINE WALTMAN is a 71 y.o. female with medical history significant for peptic ulcer disease, HTN, trigeminal neuralgia, hypothyroidism, HLD, anemia (B12 and folate deficiency), depression/anxiety who is admitted with an acute gastric ulcer with hemorrhage.  Assessment and Plan: * Acute gastric ulcer with hemorrhage Presented with hematemesis.  CT concerning for active gastric ulcer bleeding.  Hemoglobin stable at 10.8.  Likely NSAID associated. -GI to see in consultation -Keep n.p.o. -Continue IV Protonix infusion -IV fluid hydration overnight -Repeat CBC in a.m., type and screen  Hypokalemia Oral and IV supplement ordered.  Essential hypertension Holding antihypertensives.  Depression with anxiety Oral meds on hold while NPO.  Hypothyroidism Resume Synthroid when no longer NPO.  DVT prophylaxis: SCDs Start: 07/29/22 2350 Code Status: Full code, confirmed with patient on admission Family Communication: None present Disposition Plan: From ALF, dispo pending clinical progress Consults called: GI, Dr. Henrene Pastor Severity of Illness: The appropriate patient status for this patient is INPATIENT. Inpatient status is judged to be reasonable and necessary in order to provide the required intensity of service to ensure the patient's safety. The patient's presenting symptoms, physical exam findings, and initial radiographic and laboratory data  in the context of their chronic comorbidities is felt to place them at high risk for further clinical deterioration. Furthermore, it is not anticipated that the patient will be medically stable for discharge from the hospital within 2 midnights of admission.   * I certify that at the point of admission it is my clinical judgment that the patient will require inpatient hospital care spanning beyond 2 midnights from the point of admission due to high intensity of service, high risk for further deterioration and high frequency  of surveillance required.Zada Finders MD [facility]  If 7PM-7AM, please contact night-coverage [email or web address]  07/30/2022, 12:09 AM

## 2022-07-29 NOTE — ED Notes (Signed)
Patient transported to CT 

## 2022-07-29 NOTE — Hospital Course (Signed)
Caitlin Irwin is a 71 y.o. female with medical history significant for peptic ulcer disease, HTN, trigeminal neuralgia, hypothyroidism, HLD, anemia (B12 and folate deficiency), depression/anxiety who is admitted with an acute gastric ulcer with hemorrhage.

## 2022-07-29 NOTE — ED Provider Notes (Signed)
[facility] DEPT Provider Note   CSN: [id number] Arrival date & time: 07/29/22  2020     History  Chief Complaint  Patient presents with   Hematemesis   Abdominal Pain    Caitlin Irwin is a 71 y.o. female here presenting with vomiting.  Patient recently had knee surgery and is currently at [facility] assisted living.  Patient states that today she has some epigastric pain and vomited small amount of blood-tinged material.  Denies any blood in her stools.  Patient was previously scoped that showed gastric ulcer.  Patient is on Mobic but no blood thinners.  Patient is also not on any PPIs.  The history is provided by the patient.       Home Medications Prior to Admission medications   Medication Sig Start Date End Date Taking? Authorizing Provider  ALPRAZolam Duanne Moron) 0.5 MG tablet Take 1 tablet (0.5 mg total) by mouth at bedtime as needed for anxiety. 05/28/22   Cherene Altes, MD  FLUoxetine (PROZAC) 10 MG capsule Take 10 mg by mouth daily. 05/06/22   [provider]  folic acid (FOLVITE) 1 MG tablet Take 1 tablet (1 mg total) by mouth daily. 05/28/22   Cherene Altes, MD  gabapentin (NEURONTIN) 100 MG capsule Take 100 mg by mouth 3 (three) times daily.    [provider]  levothyroxine (SYNTHROID, LEVOTHROID) 75 MCG tablet Take 75 mcg by mouth daily before breakfast.  04/09/17   [provider]  Multiple Vitamin (MULTIVITAMIN WITH MINERALS) TABS tablet Take 1 tablet by mouth daily.    [provider]  spironolactone (ALDACTONE) 25 MG tablet Take 25 mg by mouth daily. 05/06/22   [provider]  SUMAtriptan (IMITREX) 100 MG tablet Take 1 tablet (100 mg total) by mouth every 2 (two) hours as needed for migraine. May repeat in 2 hours if headache persists or recurs. 06/09/19   Tegeler, Gwenyth Allegra, MD  topiramate (TOPAMAX) 50 MG tablet Take 50 mg by mouth 2 (two) times daily.    [provider]  vitamin B-12 (CYANOCOBALAMIN) 1000 MCG tablet Take 1 tablet (1,000 mcg total) by mouth daily. 06/27/21   Oswald Hillock, MD  eletriptan (RELPAX) 40 MG tablet One tablet by mouth as needed for migraine headache.  If the headache improves and then returns, dose may be repeated after 2 hours have elapsed since first dose (do not exceed 80 mg per day). may repeat in 2 hours if necessary  10/20/11  [provider]  ferrous sulfate 325 (65 FE) MG tablet Take 325 mg by mouth daily with breakfast.  10/20/11  [provider]      Allergies    Acetaminophen    Review of Systems   Review of Systems  Gastrointestinal:  Positive for abdominal pain.  All other systems reviewed and are negative.   Physical Exam Updated Vital Signs BP 105/74   Pulse 99   Temp 97.7 F (36.5 C) (Oral)   Resp 20   SpO2 99%  Physical Exam Vitals and nursing note reviewed.  HENT:     Head: Normocephalic.     Mouth/Throat:     Mouth: Mucous membranes are moist.  Eyes:     Extraocular Movements: Extraocular movements intact.     Pupils: Pupils are equal, round, and reactive to light.  Cardiovascular:     Rate and Rhythm: Normal rate and regular rhythm.     Heart sounds: Normal heart sounds.  Pulmonary:     Effort: Pulmonary effort is normal.     Breath sounds: Normal breath sounds.  Abdominal:     General: Abdomen is flat.     Comments: Mild epigastric tenderness  Skin:    General: Skin is warm.     Capillary Refill: Capillary refill takes less than 2 seconds.  Neurological:     General: No focal deficit present.     Mental Status: She is alert and oriented to person, place, and time.  Psychiatric:        Mood and Affect: Mood normal.        Behavior: Behavior normal.     ED Results / Procedures / Treatments   Labs (all labs ordered are listed, but only abnormal results are displayed) Labs Reviewed  CBC WITH DIFFERENTIAL/PLATELET - Abnormal; Notable for the following components:       Result Value   WBC 11.0 (*)    RBC 3.70 (*)    Hemoglobin 10.8 (*)    HCT 32.5 (*)    Neutro Abs 8.2 (*)    All other components within normal limits  COMPREHENSIVE METABOLIC PANEL - Abnormal; Notable for the following components:   Potassium 3.0 (*)    Chloride 114 (*)    CO2 20 (*)    Glucose, Bld 117 (*)    BUN 37 (*)    Calcium 10.7 (*)    Albumin 3.4 (*)    AST 13 (*)    All other components within normal limits  MAGNESIUM  LIPASE, BLOOD  SAMPLE TO BLOOD BANK    EKG EKG Interpretation  Date/Time:  Friday July 29 2022 21:28:00 EST Ventricular Rate:  99 PR Interval:  144 QRS Duration: 90 QT Interval:  344 QTC Calculation: 441 R Axis:   -59 Text Interpretation: Normal sinus rhythm Left anterior fascicular block Moderate voltage criteria for LVH, may be normal variant ( R in aVL , Cornell product ) Nonspecific ST and T wave abnormality Abnormal ECG When compared with ECG of 25-Mar-2022 19:17, Since last tracing rate faster Confirmed by Wandra Arthurs [phone]) on 07/29/2022 10:00:07 PM  Radiology No results found.  Procedures Procedures    Medications Ordered in ED Medications  sodium chloride 0.9 % bolus 1,000 mL (has no administration in time range)  ondansetron (ZOFRAN) injection 4 mg (has no administration in time range)  famotidine (PEPCID) IVPB 20 mg premix (has no administration in time range)  potassium chloride SA (KLOR-CON M) CR tablet 40 mEq (has no administration in time range)  iohexol (OMNIPAQUE) 300 MG/ML solution 100 mL (100 mLs Intravenous Contrast Given 07/29/22 2153)    ED Course/ Medical Decision Making/ A&P                           Medical Decision Making Caitlin Irwin is a 71 y.o. female here presenting with epigastric pain and blood in her vomit.  She has a history of gastric ulcers with concern for possible bleeding ulcer.  Plan to get CBC and CMP and lipase and CT abdomen pelvis  11:00 PM CT showed possible bleeding ulcer  with blood in the stomach.  Hemoglobin is stable at 10.  I consulted Dr. Henrene Pastor from GI to see patient (patient has seen Dr. Silverio Decamp in the past).  He agreed with high-dose PPI and n.p.o. after midnight.  He plans to see patient and possibly do endoscopy tomorrow.   Problems Addressed: Acute gastric  ulcer with hemorrhage: acute illness or injury  Amount and/or Complexity of Data Reviewed Labs: ordered. Decision-making details documented in ED Course. Radiology: ordered and independent interpretation performed. Decision-making details documented in ED Course. ECG/medicine tests: ordered.  Risk Prescription drug management.    Final Clinical Impression(s) / ED Diagnoses Final diagnoses:  None    Rx / DC Orders ED Discharge Orders     None         Drenda Freeze, MD 07/29/22 2317

## 2022-07-29 NOTE — ED Triage Notes (Signed)
Pt BIB EMS from [facility]. Facility called for hematemesis. Pt c/o abdominal pain x2 days. Pt has had limited intake for 2 days. Facility states pt is very anxious and depressed. Pt had one episode of emesis today with small amount of bright red blood. Facility denies blood stools. EMS reports no tenderness to abdomen.  128/78 HR 100-110 98% RA CBG 153 Pt has no family involved.

## 2022-07-30 ENCOUNTER — Encounter (HOSPITAL_COMMUNITY)
Admission: EM | Disposition: A | Discharge status: Nursing Home (Includes ICF) | Payer: Self-pay | Source: Skilled Nursing Facility | Attending: Internal Medicine

## 2022-07-30 ENCOUNTER — Encounter (HOSPITAL_COMMUNITY): Payer: Self-pay | Admitting: Internal Medicine

## 2022-07-30 ENCOUNTER — Inpatient Hospital Stay (HOSPITAL_COMMUNITY): Payer: PPO | Admitting: Anesthesiology

## 2022-07-30 DIAGNOSIS — K259 Gastric ulcer, unspecified as acute or chronic, without hemorrhage or perforation: Secondary | ICD-10-CM

## 2022-07-30 DIAGNOSIS — E876 Hypokalemia: Secondary | ICD-10-CM

## 2022-07-30 DIAGNOSIS — K920 Hematemesis: Secondary | ICD-10-CM

## 2022-07-30 DIAGNOSIS — I10 Essential (primary) hypertension: Secondary | ICD-10-CM

## 2022-07-30 DIAGNOSIS — K449 Diaphragmatic hernia without obstruction or gangrene: Secondary | ICD-10-CM

## 2022-07-30 DIAGNOSIS — K222 Esophageal obstruction: Secondary | ICD-10-CM

## 2022-07-30 DIAGNOSIS — K250 Acute gastric ulcer with hemorrhage: Secondary | ICD-10-CM | POA: Diagnosis not present

## 2022-07-30 DIAGNOSIS — R1013 Epigastric pain: Secondary | ICD-10-CM

## 2022-07-30 DIAGNOSIS — D62 Acute posthemorrhagic anemia: Secondary | ICD-10-CM

## 2022-07-30 HISTORY — PX: BIOPSY: SHX5522

## 2022-07-30 HISTORY — PX: HOT HEMOSTASIS: SHX5433

## 2022-07-30 HISTORY — PX: ESOPHAGOGASTRODUODENOSCOPY (EGD) WITH PROPOFOL: SHX5813

## 2022-07-30 HISTORY — PX: SCLEROTHERAPY: SHX6841

## 2022-07-30 LAB — CBC
HCT: 22.2 % — ABNORMAL LOW (ref 36.0–46.0)
Hemoglobin: 7.1 g/dL — ABNORMAL LOW (ref 12.0–15.0)
MCH: 28.9 pg (ref 26.0–34.0)
MCHC: 32 g/dL (ref 30.0–36.0)
MCV: 90.2 fL (ref 80.0–100.0)
Platelets: 247 10*3/uL (ref 150–400)
RBC: 2.46 MIL/uL — ABNORMAL LOW (ref 3.87–5.11)
RDW: 13.2 % (ref 11.5–15.5)
WBC: 8.7 10*3/uL (ref 4.0–10.5)
nRBC: 0 % (ref 0.0–0.2)

## 2022-07-30 LAB — BASIC METABOLIC PANEL
Anion gap: 3 — ABNORMAL LOW (ref 5–15)
BUN: 45 mg/dL — ABNORMAL HIGH (ref 8–23)
CO2: 17 mmol/L — ABNORMAL LOW (ref 22–32)
Calcium: 9.5 mg/dL (ref 8.9–10.3)
Chloride: 122 mmol/L — ABNORMAL HIGH (ref 98–111)
Creatinine, Ser: 0.77 mg/dL (ref 0.44–1.00)
GFR, Estimated: >60 mL/min (ref >60)
Glucose, Bld: 108 mg/dL — ABNORMAL HIGH (ref 70–99)
Potassium: 4.1 mmol/L (ref 3.5–5.1)
Sodium: 142 mmol/L (ref 135–145)

## 2022-07-30 LAB — HEMOGLOBIN AND HEMATOCRIT, BLOOD
HCT: 28.8 % — ABNORMAL LOW (ref 36.0–46.0)
Hemoglobin: 10 g/dL — ABNORMAL LOW (ref 12.0–15.0)

## 2022-07-30 LAB — PREPARE RBC (CROSSMATCH)

## 2022-07-30 SURGERY — ESOPHAGOGASTRODUODENOSCOPY (EGD) WITH PROPOFOL
Anesthesia: Monitor Anesthesia Care

## 2022-07-30 MED ORDER — MELATONIN 3 MG PO TABS
3.0000 mg | ORAL_TABLET | Freq: Every day | ORAL | Status: DC
  Administered 2022-07-30: 3 mg via ORAL
  Filled 2022-07-30: qty 1

## 2022-07-30 MED ORDER — PROPOFOL 500 MG/50ML IV EMUL
INTRAVENOUS | Status: DC | PRN
  Administered 2022-07-30: 100 ug/kg/min via INTRAVENOUS
  Administered 2022-07-30: 50 mg via INTRAVENOUS

## 2022-07-30 MED ORDER — MUPIROCIN 2 % EX OINT
1.0000 | TOPICAL_OINTMENT | Freq: Two times a day (BID) | CUTANEOUS | Status: DC
  Administered 2022-07-30 – 2022-07-31 (×2): 1 via NASAL
  Filled 2022-07-30: qty 22

## 2022-07-30 MED ORDER — EPINEPHRINE 1 MG/10ML IJ SOSY
PREFILLED_SYRINGE | INTRAMUSCULAR | Status: AC
  Filled 2022-07-30: qty 10

## 2022-07-30 MED ORDER — SODIUM CHLORIDE 0.9 % IV SOLN: INTRAVENOUS | Status: DC

## 2022-07-30 MED ORDER — SODIUM CHLORIDE 0.9 % IV SOLN: INTRAVENOUS | Status: AC

## 2022-07-30 MED ORDER — SODIUM CHLORIDE (PF) 0.9 % IJ SOLN
PREFILLED_SYRINGE | INTRAMUSCULAR | Status: DC | PRN
  Administered 2022-07-30: 3 mL

## 2022-07-30 MED ORDER — PROPOFOL 500 MG/50ML IV EMUL
INTRAVENOUS | Status: AC
  Filled 2022-07-30: qty 50

## 2022-07-30 MED ORDER — QUETIAPINE FUMARATE 25 MG PO TABS: 25.0000 mg | ORAL_TABLET | Freq: Every evening | ORAL | Status: DC | PRN

## 2022-07-30 MED ORDER — SODIUM CHLORIDE 0.9% IV SOLUTION: Freq: Once | INTRAVENOUS | Status: DC

## 2022-07-30 SURGICAL SUPPLY — 15 items

## 2022-07-30 NOTE — Progress Notes (Signed)
       CROSS COVER NOTE  NAME: Caitlin Irwin MRN: [id number] DOB : 01-23-1951    Date of Service   07/30/2022   HPI/Events of Note   RN reports patient is alert and oriented, but is also intermittently confused and unwilling to follow safety commands.   The patient continues to remove essential equipment and multiple IVs.  RN has already attempted to educate and has used safety mitts to prevent patient from interfering with medical care.  However, RN reports that patient has bitten mitts off.    No physical aggression or violence reported.  RN to continue orienting patient.  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   Interventions/ Plan   Soft wrist restraints       Raenette Rover, DNP, [place]

## 2022-07-30 NOTE — ED Notes (Signed)
Pt tx to Endo

## 2022-07-30 NOTE — Assessment & Plan Note (Signed)
Resume Synthroid when no longer NPO.

## 2022-07-30 NOTE — ED Notes (Signed)
Went into room, pt attempting to get out of bed, pt pulled out her IV.

## 2022-07-30 NOTE — ED Notes (Signed)
This pt is intermittently confused.

## 2022-07-30 NOTE — ED Notes (Signed)
Pt has been screaming "get me out of this shit" Pt was re-informed the purpose of the restraints and she calmed down. Pt asked "where am I?" "Why am I?" This RN reiterated and explained where she was and what was happening.

## 2022-07-30 NOTE — Assessment & Plan Note (Signed)
Holding antihypertensives.

## 2022-07-30 NOTE — Assessment & Plan Note (Signed)
Oral and IV supplement ordered.

## 2022-07-30 NOTE — Transfer of Care (Signed)
Immediate Anesthesia Transfer of Care Note  Patient: Caitlin Irwin  Procedure(s) Performed: Procedure(s): ESOPHAGOGASTRODUODENOSCOPY (EGD) WITH PROPOFOL (N/A) BIOPSY HOT HEMOSTASIS (ARGON PLASMA COAGULATION/BICAP) (N/A) SCLEROTHERAPY  Patient Location: PACU and Endoscopy Unit  Anesthesia Type:MAC  Level of Consciousness: awake, alert  and oriented  Airway & Oxygen Therapy: Patient Spontanous Breathing and Patient connected to nasal cannula oxygen  Post-op Assessment: Report given to RN and Post -op Vital signs reviewed and stable  Post vital signs: Reviewed and stable  Last Vitals:  Vitals:   07/30/22 1025 07/30/22 1135  BP: (!) 118/102 (!) 169/66  Pulse: 84 88  Resp: 18 18  Temp:  36.5 C  SpO2: 740% 814%    Complications: No apparent anesthesia complications

## 2022-07-30 NOTE — Progress Notes (Signed)
[facility] PROGRESS NOTE    Progress Note  Caitlin Irwin  QIO:[id number] DOB: 1950-09-28 DOA: 07/29/2022 PCP: Michael Boston, MD     Brief Narrative:   Caitlin Irwin is an 71 y.o. female past medical history significant for peptic ulcer disease, trigeminal neuralgia, B12 and folate deficiency leading to macrocytic anemia depression anxiety, recently underwent ORIF of the left tibial plateau on 05/22/2022 is not currently on PPI she does take ibuprofen for migraine and she was on aspirin 325 twice daily he had an endoscopy back in 2013 that showed a gastric ulcer comes into the ED for hematemesis and abdominal pain that started on the day of admission   Assessment/Plan:   Acute gastric ulcer with hemorrhage Likely due to NSAID CT concerning for active bleeding. Hemoglobin this morning is 10.8.  After fluid resuscitation her hemoglobin this morning 7.1. Will go ahead and transfuse 2 units of packed red blood cells.  Acute metabolic encephalopathy: She has no history of dementia question due to drop in hemoglobin. Continue restraint, melatonin at night.  Hypokalemia: Improved after IV supplementation. Recheck tomorrow morning.  Essential hypertension: Continue to hold all antihypertensive medication.  Hypothyroidism Resume Synthroid once able to take p.o.  Depression with anxiety Resume meds once able to take p.o.'s.    DVT prophylaxis: scd Family Communication:none Status is: Inpatient Remains inpatient appropriate because: Acute upper GI bleed    Code Status:     Code Status Orders  (From admission, onward)           Start     Ordered   07/29/22 2351  Full code  Continuous       Question:  By:  Answer:  Consent: discussion documented in EHR   07/29/22 2351           Code Status History     Date Active Date Inactive Code Status Order ID Comments User Context   05/21/2022 2204 05/28/2022 1943 Full Code [id number]  Marcelyn Bruins, MD  ED   06/26/2021 1335 06/27/2021 2157 Full Code [id number]  Orma Flaming, MD ED   12/22/2020 2325 12/25/2020 1703 Full Code [id number]  Vianne Bulls, MD ED   11/13/2018 2138 11/20/2018 1859 Full Code [id number]  Etta Quill, DO ED   05/28/2015 0809 05/29/2015 1703 Partial Code [id number]  Willia Craze, NP Inpatient   08/28/2014 1015 08/29/2014 1723 Full Code [id number]  Karen Kitchens Inpatient   10/12/2013 2319 10/13/2013 1922 Full Code [id number]  Carmin Muskrat, MD ED         IV Access:   Peripheral IV   Procedures and diagnostic studies:   CT ABDOMEN PELVIS W CONTRAST  Result Date: 07/29/2022 CLINICAL DATA:  Acute abdominal pain with evidence of hematemesis EXAM: CT ABDOMEN AND PELVIS WITH CONTRAST TECHNIQUE: Multidetector CT imaging of the abdomen and pelvis was performed using the standard protocol following bolus administration of intravenous contrast. RADIATION DOSE REDUCTION: This exam was performed according to the departmental dose-optimization program which includes automated exposure control, adjustment of the mA and/or kV according to patient size and/or use of iterative reconstruction technique. CONTRAST:  166m OMNIPAQUE IOHEXOL 300 MG/ML  SOLN COMPARISON:  01/10/2019 FINDINGS: Lower chest: Lung bases are free of acute infiltrate or sizable effusion. Hepatobiliary: Stable small hypodensity is noted in the dome of the liver best seen on image number 7 of series 2 consistent with a small cysts stable from the prior study. Fatty infiltration of the liver is noted.  Gallbladder has been surgically removed. Pancreas: Unremarkable. No pancreatic ductal dilatation or surrounding inflammatory changes. Spleen: Normal in size without focal abnormality. Adrenals/Urinary Tract: Adrenal glands are within normal limits. Kidneys demonstrate a normal enhancement pattern bilaterally. A few scattered small cysts are noted stable in appearance from the prior study. No obstructive changes  are seen. The bladder is partially distended. Multiple calcific densities are noted in the dependent portion of the bladder which may be related to prior stone fragments. Bladder is otherwise within normal limits. Stomach/Bowel: No obstructive or inflammatory changes of the colon are seen. The appendix is not well visualized may have been surgically. Small bowel is within normal limits. In the distal gastric antrum there is pooling of contrast material on both the early and delayed phase images consistent with active GI hemorrhage. This likely represents a bleeding gastric ulcer. No perforation is seen. Vascular/Lymphatic: Aortic atherosclerosis. No enlarged abdominal or pelvic lymph nodes. Reproductive: Status post hysterectomy. No adnexal masses. Other: No abdominal wall hernia or abnormality. No abdominopelvic ascites. Musculoskeletal: Postsurgical changes are noted in the lower lumbar spine. No acute bony abnormality is seen. IMPRESSION: Changes consistent with active GI bleeding in the distal stomach which corresponds with the patient's given clinical history of hematemesis. This may be related to a bleeding gastric ulcer. Direct visualization is recommended. Calcific densities in the posterior aspect of the bladder which may represent small bladder calculi or previous stone fragments. No obstructive changes are noted. Electronically Signed   By: Inez Catalina M.D.   On: 07/29/2022 22:38     Medical Consultants:   None.   Subjective:    Caitlin Irwin relates that the nurses have been mean to her  Objective:    Vitals:   07/30/22 0015 07/30/22 0215 07/30/22 0400 07/30/22 0609  BP:  123/61 134/76   Pulse:  79 87   Resp:  20 17   Temp: 97.9 F (36.6 C)   98 F (36.7 C)  TempSrc: Oral     SpO2:  100% 100%    SpO2: 100 %  No intake or output data in the 24 hours ending 07/30/22 0651 There were no vitals filed for this visit.  Exam: General exam: In no acute distress. Respiratory  system: Good air movement and clear to auscultation. Cardiovascular system: S1 & S2 heard, RRR. No JVD. Gastrointestinal system: Abdomen is nondistended, soft and nontender.  Extremities: No pedal edema. Skin: No rashes, lesions or ulcers Psychiatry: Judgement and insight appear normal. Mood & affect appropriate.    Data Reviewed:    Labs: Basic Metabolic Panel: Recent Labs  Lab 07/29/22 2057 07/30/22 0355  NA 140 142  K 3.0* 4.1  CL 114* 122*  CO2 20* 17*  GLUCOSE 117* 108*  BUN 37* 45*  CREATININE 0.93 0.77  CALCIUM 10.7* 9.5  MG 1.8  --    GFR CrCl cannot be calculated (Unknown ideal weight.). Liver Function Tests: Recent Labs  Lab 07/29/22 2057  AST 13*  ALT 13  ALKPHOS 71  BILITOT 0.5  PROT 6.7  ALBUMIN 3.4*   Recent Labs  Lab 07/29/22 2057  LIPASE 38   No results for input(s): "AMMONIA" in the last 168 hours. Coagulation profile No results for input(s): "INR", "PROTIME" in the last 168 hours. COVID-19 Labs  No results for input(s): "DDIMER", "FERRITIN", "LDH", "CRP" in the last 72 hours.  Lab Results  Component Value Date   SARSCOV2NAA NEGATIVE 06/26/2021   [place] NEGATIVE 12/22/2020    CBC:  Recent Labs  Lab 07/29/22 2057 07/30/22 0601  WBC 11.0* 8.7  NEUTROABS 8.2*  --   HGB 10.8* 7.1*  HCT 32.5* 22.2*  MCV 87.8 90.2  PLT 332 247   Cardiac Enzymes: No results for input(s): "CKTOTAL", "CKMB", "CKMBINDEX", "TROPONINI" in the last 168 hours. BNP (last 3 results) No results for input(s): "PROBNP" in the last 8760 hours. CBG: No results for input(s): "GLUCAP" in the last 168 hours. D-Dimer: No results for input(s): "DDIMER" in the last 72 hours. Hgb A1c: No results for input(s): "HGBA1C" in the last 72 hours. Lipid Profile: No results for input(s): "CHOL", "HDL", "LDLCALC", "TRIG", "CHOLHDL", "LDLDIRECT" in the last 72 hours. Thyroid function studies: No results for input(s): "TSH", "T4TOTAL", "T3FREE", "THYROIDAB" in the last  72 hours.  Invalid input(s): "FREET3" Anemia work up: No results for input(s): "VITAMINB12", "FOLATE", "FERRITIN", "TIBC", "IRON", "RETICCTPCT" in the last 72 hours. Sepsis Labs: Recent Labs  Lab 07/29/22 2057 07/30/22 0601  WBC 11.0* 8.7   Microbiology No results found for this or any previous visit (from the past 240 hour(s)).   Medications:    sodium chloride flush  3 mL Intravenous Q12H   Continuous Infusions:  lactated ringers 1,000 mL with potassium chloride 20 mEq infusion 100 mL/hr at 07/30/22 0056   pantoprazole 8 mg/hr (07/29/22 2359)      LOS: 1 day   Charlynne Cousins  [facility]  07/30/2022, 6:51 AM

## 2022-07-30 NOTE — ED Notes (Signed)
Soft wrist restraints are applied per order. This RN requested restraints for this pt due to pulling out IV lines, and removing equipment. This RN has explained purpose of the restraints to pt and she refused to "be treated like this"

## 2022-07-30 NOTE — ED Notes (Signed)
This pt wanted to go to the restroom but she said "I cannot walk". Pt was placed on a purewick and did not want to urinate. Pt pulled out an IV and stated "This just fell out by accident" This RN replaced the IV and she asked this nurse to "shut up and stop torturing me" This RN placed mitts on the pt hands so she could not pull out IV or take off cardiac leads.

## 2022-07-30 NOTE — Consult Note (Signed)
HISTORY OF PRESENT ILLNESS:  Caitlin Irwin is a 71 y.o. female with multiple medical problems as listed below.  Recent orthopedic surgery as noted.  Brought to the hospital with abdominal pain, nausea, and vomiting.  Subsequently developed hematemesis.  Does have a remote history of peptic ulcer disease.  EGD with Dr. Deatra Ina 2011 revealed pyloric channel ulcer.  Reviewed.  The patient's baseline hemoglobin is about 8.5.  Admission hemoglobin 10.8.  After hydration has dropped to 7.1.  Has been hemodynamically stable.  I am seeing her in the emergency room.  No complaint of pain at this time.  No further vomiting.  Remains hemodynamically stable.  She is inquiring about timing of her endoscopy.  She does have restraints on as she has reportedly pulled out her IV several times, according to nursing.  She has been on aspirin.  No PPI at home.  CT scan here demonstrated evidence of bleeding in the region of the gastric antrum.  Reviewed.  REVIEW OF SYSTEMS:  All non-GI ROS negative unless otherwise stated in the HPI except for arthritis, headaches  Past Medical History:  Diagnosis Date   Accidental poisoning by aspirin 09/2014   Anemia    Anxiety    Arthritis    "right knee; right shoulder" (09/15/2015)   Ataxia 03/24/2014   Cataract    both eyes   Chronic lower back pain    scoliosis   Depression    GERD (gastroesophageal reflux disease)    H/O hiatal hernia    High cholesterol    History of blood transfusion    "related to OR"   Hypersomnia    IBS (irritable bowel syndrome)    Migraines    "maybe once/week now" (09/15/2015)   Osteoporosis    Other constipation    Parotid tumor    PONV (postoperative nausea and vomiting)    hx of " getting too much" - 1998, slow to take up by body    PUD (peptic ulcer disease)    Trigeminal neuralgia    S/P radiation therapy    Unspecified hypothyroidism     Past Surgical History:  Procedure Laterality Date   ABDOMINAL HYSTERECTOMY     ANKLE  FRACTURE SURGERY Left    ANTERIOR LUMBAR FUSION  04/2010   L4-5; L5-S1   BACK SURGERY     BRAIN SURGERY     parotid tumor removed    CHOLECYSTECTOMY OPEN     EPIGASTRIC HERNIA REPAIR  09/15/2015   Procedure: OPEN HERNIA REPAIR EPIGASTRIC ADULT;  Surgeon: Greer Pickerel, MD;  Location: [facility];  Service: General;;   ESOPHAGOGASTRODUODENOSCOPY  06/12/2012   Procedure: ESOPHAGOGASTRODUODENOSCOPY (EGD);  Surgeon: Lear Ng, MD;  Location: [facility];  Service: Endoscopy;  Laterality: N/A;   EXTERNAL FIXATION LEG Right 11/14/2018   Procedure: EXTERNAL FIXATION LEG;  Surgeon: Hiram Gash, MD;  Location: [place];  Service: Orthopedics;  Laterality: Right;   FRACTURE SURGERY     gamma knife for trig neuralgia  12/27/11   HERNIA REPAIR     KNEE CARTILAGE SURGERY Right 1981   "converted from scope to open during the OR; ligament repair"   LAMINECTOMY AND MICRODISCECTOMY LUMBAR SPINE  1980   L4-5   LAPAROSCOPIC EPIGASTRIC HERNIA REPAIR  09/15/2015   open   ORIF TIBIA PLATEAU Right 11/16/2018   Procedure: OPEN REDUCTION INTERNAL FIXATION (ORIF) TIBIAL PLATEAU;  Surgeon: Hiram Gash, MD;  Location: [place];  Service: Orthopedics;  Laterality: Right;   ORIF TIBIA  PLATEAU Left 05/22/2022   Procedure: OPEN REDUCTION INTERNAL FIXATION (ORIF) TIBIAL PLATEAU;  Surgeon: Callie Fielding, MD;  Location: [facility];  Service: Orthopedics;  Laterality: Left;   PAROTID GLAND TUMOR EXCISION     benign   SHOULDER ARTHROSCOPY W/ ROTATOR CUFF REPAIR Right 11-2014   TRIGEMINAL NERVE DECOMPRESSION     to stop migraines and it did not work     Social History Caitlin Irwin  reports that she has never smoked. She has never used smokeless tobacco. She reports that she does not drink alcohol and does not use drugs.  family history includes Diabetes in her father; Heart disease in her maternal grandfather and another family member; Migraines in her brother and mother; Ovarian cancer in her mother.  Allergies   Allergen Reactions   Acetaminophen Nausea And Vomiting       PHYSICAL EXAMINATION: Vital signs: BP (!) 118/102   Pulse 84   Temp 99 F (37.2 C) (Oral)   Resp 18   SpO2 100%   Constitutional: Thin, chronically ill-appearing, no acute distress Psychiatric: Pleasant, alert and oriented x3, cooperative Eyes: extraocular movements intact, anicteric, conjunctiva pink Mouth: oral pharynx moist, no lesions Neck: supple no lymphadenopathy Cardiovascular: heart regular rate and rhythm, no murmur Lungs: clear to auscultation bilaterally Abdomen: soft, nontender, nondistended, no obvious ascites, no peritoneal signs, normal bowel sounds, no organomegaly Rectal: Omitted Extremities: no clubbing, cyanosis, or lower extremity edema bilaterally.  Upper extremity restraints in place Skin: no significant lesions on visible extremities.  Well-healed scars from prior surgeries Neuro: No focal deficits.  Cranial nerves intact  ASSESSMENT:  1.  Acute upper GI bleed.  Suspect gastric ulcer due to NSAIDs.  Currently hemodynamically stable. 2.  Acute blood loss anemia.  Blood transfusions x 2 ordered. 3.  Medical problems.   PLAN:  1.  Urgent upper endoscopy.  Patient is higher than baseline risk given her age and comorbidities.The nature of the procedure, as well as the risks, benefits, and alternatives were carefully and thoroughly reviewed with the patient. Ample time for discussion and questions allowed. The patient understood, was satisfied, and agreed to proceed. 2.  Transfuse as needed 3.  Continue PPI 4.  Ongoing general medical care with primary service A total of 80 minutes was spent preparing to see the patient, reviewing multiple laboratories, x-ray studies, prior endoscopy reports, and emergency room assessments.  Obtaining comprehensive history, performing medically appropriate physical examination, counseling and educating the patient regarding her active GI bleeding, ordering urgent  endoscopic procedure, and documenting clinical information in the health record.

## 2022-07-30 NOTE — Assessment & Plan Note (Signed)
Presented with hematemesis.  CT concerning for active gastric ulcer bleeding.  Hemoglobin stable at 10.8.  Likely NSAID associated. -GI to see in consultation -Keep n.p.o. -Continue IV Protonix infusion -IV fluid hydration overnight -Repeat CBC in a.m., type and screen

## 2022-07-30 NOTE — Anesthesia Postprocedure Evaluation (Signed)
Anesthesia Post Note  Patient: Caitlin Irwin  Procedure(s) Performed: ESOPHAGOGASTRODUODENOSCOPY (EGD) WITH PROPOFOL BIOPSY HOT HEMOSTASIS (ARGON PLASMA COAGULATION/BICAP) SCLEROTHERAPY     Patient location during evaluation: PACU Anesthesia Type: MAC Level of consciousness: awake and alert Pain management: pain level controlled Vital Signs Assessment: post-procedure vital signs reviewed and stable Respiratory status: spontaneous breathing, nonlabored ventilation and respiratory function stable Cardiovascular status: blood pressure returned to baseline and stable Postop Assessment: no apparent nausea or vomiting Anesthetic complications: no   No notable events documented.  Last Vitals:  Vitals:   07/30/22 1400 07/30/22 1429  BP: (!) 158/67 (!) 135/90  Pulse:  90  Resp: (!) 22 20  Temp: 36.9 C 37.1 C  SpO2: 100% 100%    Last Pain:  Vitals:   07/30/22 1429  TempSrc: Oral  PainSc:                  Pervis Hocking

## 2022-07-30 NOTE — Op Note (Signed)
[facility] Patient Name: Caitlin Irwin Procedure Date: 07/30/2022 MRN: [id number] Attending MD: Docia Chuck. Henrene Pastor , MD, [id number] Date of Birth: 1950-09-29 CSN: [id number] Age: 71 Admit Type: Outpatient Procedure:                Upper GI endoscopy with control of bleeding; with                            biopsies Indications:              Acute post hemorrhagic anemia, Hematemesis,                            Abnormal CT of the GI tract Providers:                Docia Chuck. Henrene Pastor, MD, Cletis Athens, Technician Referring MD:             [facility] Medicines:                Monitored Anesthesia Care Complications:            No immediate complications. Estimated Blood Loss:     Estimated blood loss: none. Procedure:                Pre-Anesthesia Assessment:                           - Prior to the procedure, a History and Physical                            was performed, and patient medications and                            allergies were reviewed. The patient's tolerance of                            previous anesthesia was also reviewed. The risks                            and benefits of the procedure and the sedation                            options and risks were discussed with the patient.                            All questions were answered, and informed consent                            was obtained. Prior Anticoagulants: The patient has                            taken no anticoagulant or antiplatelet agents. ASA                            Grade Assessment: II - A patient with mild systemic  disease. After reviewing the risks and benefits,                            the patient was deemed in satisfactory condition to                            undergo the procedure.                           After obtaining informed consent, the endoscope was                            passed under direct vision. Throughout the                             procedure, the patient's blood pressure, pulse, and                            oxygen saturations were monitored continuously. The                            [id number]-H190 ([id number] Olympus endoscope was introduced                            through the mouth, and advanced to the second part                            of duodenum. The upper GI endoscopy was                            accomplished without difficulty. The patient                            tolerated the procedure well. Scope In: Scope Out: Findings:      Esophagus revealed a benign large caliber distal esophageal stricture or       ring.      The esophagus was otherwise normal.      Two non-bleeding cratered gastric ulcers with pigmented material were       found in the gastric antrum. The largest lesion was 15 mm in largest       dimension. Area was successfully injected with 4 mL of a 0.1 mg/mL       solution of epinephrine for hemostasis. This was followed by BiCap       therapy. Hemostasis maintained. Biopsies of the antrum, away from the       ulcer, were taken with a cold forceps for histology, to rule out       Helicobacter pylori.      The stomach was otherwise normal except for a moderate hiatal hernia.      The examined duodenum was normal.      The cardia and gastric fundus were normal on retroflexion. Impression:               1. Nonbleeding gastric ulcer with stigmata status  post endoscopic hemostatic therapy.                           2. Smaller clean-based ulcer                           3. Incidental esophageal ring or stricture                           4. Hiatal hernia. Otherwise normal EGD. Moderate Sedation:      none Recommendation:           1. Clear liquid diet                           2. Continue IV PPI                           3. Avoid NSAIDs                           4. Continue with blood transfusions                           5. Monitor for evidence of  rebleeding (clinically                            and blood counts).                           6. Follow-up biopsies. If H. pylori present, treat.                           GI will follow Procedure Code(s):        --- Professional ---                           24097, 59, Esophagogastroduodenoscopy, flexible,                            transoral; with control of bleeding, any method                           43239, Esophagogastroduodenoscopy, flexible,                            transoral; with biopsy, single or multiple Diagnosis Code(s):        --- Professional ---                           K22.2, Esophageal obstruction                           K25.9, Gastric ulcer, unspecified as acute or                            chronic, without hemorrhage or perforation  D62, Acute posthemorrhagic anemia                           K92.0, Hematemesis                           R93.3, Abnormal findings on diagnostic imaging of                            other parts of digestive tract CPT copyright 2022 [employer] Association. All rights reserved. The codes documented in this report are preliminary and upon coder review may  be revised to meet current compliance requirements. Docia Chuck. Henrene Pastor, MD 07/30/2022 12:46:21 PM This report has been signed electronically. Number of Addenda: 0

## 2022-07-30 NOTE — Assessment & Plan Note (Signed)
Oral meds on hold while NPO.

## 2022-07-30 NOTE — Anesthesia Preprocedure Evaluation (Addendum)
Anesthesia Evaluation  Patient identified by MRN, date of birth, ID band Patient awake    Reviewed: Allergy & Precautions, NPO status , Patient's Chart, lab work & pertinent test results  History of Anesthesia Complications (+) PONV and history of anesthetic complications  Airway Mallampati: II  TM Distance: >3 FB Neck ROM: Full    Dental no notable dental hx.    Pulmonary neg pulmonary ROS   Pulmonary exam normal breath sounds clear to auscultation       Cardiovascular hypertension, Pt. on medications Normal cardiovascular exam Rhythm:Regular Rate:Normal     Neuro/Psych  Headaches PSYCHIATRIC DISORDERS Anxiety Depression    Acute metabolic encephalopathy?- no hx dementia, but pulling lines out in ED     GI/Hepatic Neg liver ROS, hiatal hernia, PUD,GERD  ,,  Endo/Other  Hypothyroidism    Renal/GU negative Renal ROS  negative genitourinary   Musculoskeletal  (+) Arthritis , Osteoarthritis,    Abdominal   Peds  Hematology  (+) Blood dyscrasia, anemia Hb 7.1 this AM (10.8 yesterday)   Anesthesia Other Findings   Reproductive/Obstetrics negative OB ROS                             Anesthesia Physical Anesthesia Plan  ASA: 3  Anesthesia Plan: MAC   Post-op Pain Management:    Induction:   PONV Risk Score and Plan: 2 and Propofol infusion and TIVA  Airway Management Planned: Natural Airway and Simple Face Mask  Additional Equipment: None  Intra-op Plan:   Post-operative Plan:   Informed Consent: I have reviewed the patients History and Physical, chart, labs and discussed the procedure including the risks, benefits and alternatives for the proposed anesthesia with the patient or authorized representative who has indicated his/her understanding and acceptance.       Plan Discussed with: CRNA  Anesthesia Plan Comments: (Hb dropped to 7.1 this AM from 10 yesterday, ordered for  blood multiple hours ago but has not received it yet- will give preop)       Anesthesia Quick Evaluation

## 2022-07-31 ENCOUNTER — Encounter (HOSPITAL_COMMUNITY): Payer: Self-pay | Admitting: Internal Medicine

## 2022-07-31 DIAGNOSIS — I10 Essential (primary) hypertension: Secondary | ICD-10-CM | POA: Diagnosis not present

## 2022-07-31 DIAGNOSIS — D62 Acute posthemorrhagic anemia: Secondary | ICD-10-CM

## 2022-07-31 DIAGNOSIS — K250 Acute gastric ulcer with hemorrhage: Secondary | ICD-10-CM | POA: Diagnosis not present

## 2022-07-31 DIAGNOSIS — F418 Other specified anxiety disorders: Secondary | ICD-10-CM

## 2022-07-31 DIAGNOSIS — E876 Hypokalemia: Secondary | ICD-10-CM | POA: Diagnosis not present

## 2022-07-31 LAB — BASIC METABOLIC PANEL
Anion gap: 6 (ref 5–15)
BUN: 24 mg/dL — ABNORMAL HIGH (ref 8–23)
CO2: 22 mmol/L (ref 22–32)
Calcium: 10.2 mg/dL (ref 8.9–10.3)
Chloride: 114 mmol/L — ABNORMAL HIGH (ref 98–111)
Creatinine, Ser: 0.79 mg/dL (ref 0.44–1.00)
GFR, Estimated: >60 mL/min (ref >60)
Glucose, Bld: 102 mg/dL — ABNORMAL HIGH (ref 70–99)
Potassium: 3 mmol/L — ABNORMAL LOW (ref 3.5–5.1)
Sodium: 142 mmol/L (ref 135–145)

## 2022-07-31 LAB — CBC
HCT: 30.3 % — ABNORMAL LOW (ref 36.0–46.0)
Hemoglobin: 10 g/dL — ABNORMAL LOW (ref 12.0–15.0)
MCH: 28.3 pg (ref 26.0–34.0)
MCHC: 33 g/dL (ref 30.0–36.0)
MCV: 85.8 fL (ref 80.0–100.0)
Platelets: 206 10*3/uL (ref 150–400)
RBC: 3.53 MIL/uL — ABNORMAL LOW (ref 3.87–5.11)
RDW: 15.2 % (ref 11.5–15.5)
WBC: 10.2 10*3/uL (ref 4.0–10.5)
nRBC: 0 % (ref 0.0–0.2)

## 2022-07-31 LAB — TYPE AND SCREEN
ABO/RH(D): B POS
Antibody Screen: NEGATIVE
Unit division: 0
Unit division: 0

## 2022-07-31 LAB — BPAM RBC
Blood Product Expiration Date: 202401242359
Blood Product Expiration Date: 202401242359
ISSUE DATE / TIME: 202312301144
ISSUE DATE / TIME: 202312301536
Unit Type and Rh: 7300
Unit Type and Rh: 7300

## 2022-07-31 MED ORDER — SUMATRIPTAN SUCCINATE 50 MG PO TABS: 100.0000 mg | ORAL_TABLET | ORAL | Status: DC | PRN

## 2022-07-31 MED ORDER — PANTOPRAZOLE SODIUM 40 MG PO TBEC
40.0000 mg | DELAYED_RELEASE_TABLET | Freq: Two times a day (BID) | ORAL | Status: DC
  Administered 2022-07-31: 40 mg via ORAL
  Filled 2022-07-31: qty 1

## 2022-07-31 MED ORDER — TRAMADOL HCL 50 MG PO TABS: 50.0000 mg | ORAL_TABLET | Freq: Four times a day (QID) | ORAL | Status: DC | PRN

## 2022-07-31 MED ORDER — HYDROXYZINE HCL 25 MG PO TABS: 12.5000 mg | ORAL_TABLET | Freq: Every day | ORAL | Status: DC

## 2022-07-31 MED ORDER — PANTOPRAZOLE SODIUM 40 MG PO TBEC: 40.0000 mg | DELAYED_RELEASE_TABLET | Freq: Two times a day (BID) | ORAL | 3 refills | Status: DC

## 2022-07-31 MED ORDER — FLUOXETINE HCL 10 MG PO CAPS
10.0000 mg | ORAL_CAPSULE | Freq: Every day | ORAL | Status: DC
  Administered 2022-07-31: 10 mg via ORAL
  Filled 2022-07-31 (×2): qty 1

## 2022-07-31 MED ORDER — SPIRONOLACTONE 25 MG PO TABS
25.0000 mg | ORAL_TABLET | Freq: Every day | ORAL | Status: DC
  Administered 2022-07-31: 25 mg via ORAL
  Filled 2022-07-31: qty 1

## 2022-07-31 MED ORDER — GABAPENTIN 100 MG PO CAPS
100.0000 mg | ORAL_CAPSULE | Freq: Three times a day (TID) | ORAL | Status: DC
  Administered 2022-07-31: 100 mg via ORAL
  Filled 2022-07-31: qty 1

## 2022-07-31 MED ORDER — TOPIRAMATE 25 MG PO TABS
50.0000 mg | ORAL_TABLET | Freq: Two times a day (BID) | ORAL | Status: DC
  Administered 2022-07-31: 50 mg via ORAL
  Filled 2022-07-31: qty 2

## 2022-07-31 MED ORDER — MELATONIN 5 MG PO TABS: 10.0000 mg | ORAL_TABLET | Freq: Every day | ORAL | Status: DC

## 2022-07-31 MED ORDER — TRAMADOL HCL 50 MG PO TABS: 50.0000 mg | ORAL_TABLET | Freq: Four times a day (QID) | ORAL | 0 refills | Status: AC | PRN

## 2022-07-31 MED ORDER — PANTOPRAZOLE SODIUM 40 MG PO TBEC: 40.0000 mg | DELAYED_RELEASE_TABLET | Freq: Two times a day (BID) | ORAL | 3 refills | Status: AC

## 2022-07-31 MED ORDER — LEVOTHYROXINE SODIUM 50 MCG PO TABS
75.0000 ug | ORAL_TABLET | Freq: Every day | ORAL | Status: DC
  Administered 2022-07-31: 75 ug via ORAL
  Filled 2022-07-31: qty 1

## 2022-07-31 MED ORDER — POTASSIUM CHLORIDE CRYS ER 20 MEQ PO TBCR
40.0000 meq | EXTENDED_RELEASE_TABLET | Freq: Two times a day (BID) | ORAL | Status: DC
  Administered 2022-07-31: 40 meq via ORAL
  Filled 2022-07-31: qty 2

## 2022-07-31 NOTE — Discharge Summary (Signed)
Physician Discharge Summary  Caitlin Irwin:[id number] DOB: 1951-06-27 DOA: 07/29/2022  PCP: Michael Boston, MD  Admit date: 07/29/2022 Discharge date: 07/31/2022  Admitted From: ALF Disposition:  ALF  Recommendations for Outpatient Follow-up:  Follow up with PCP in 1-2 weeks Please obtain BMP/CBC in one week   Home Health:No Equipment/Devices:None  Discharge Condition:Stable CODE STATUS:Full Diet recommendation: Heart Healthy   Brief/Interim Summary: 71 y.o. female past medical history significant for peptic ulcer disease, trigeminal neuralgia, B12 and folate deficiency leading to macrocytic anemia depression anxiety, recently underwent ORIF of the left tibial plateau on 05/22/2022 is not currently on PPI she does take ibuprofen for migraine and she was on aspirin 325 twice daily he had an endoscopy back in 2013 that showed a gastric ulcer comes into the ED for hematemesis and abdominal pain that started on the day of admission   Discharge Diagnoses:  Principal Problem:   Acute gastric ulcer with hemorrhage Active Problems:   Hypokalemia   Hypothyroidism   Depression with anxiety   Essential hypertension   Esophageal stricture   Hiatal hernia  Acute peptic ulcer disease due to NSAIDs: Her hemoglobin dropped to 7.1 she was transfused 2 units packed red blood cells her hemoglobin improved to 10. GI was consulted who performed an EGD that showed nonbleeding gastric ulcer biopsies were taken. She will continue Protonix p.o. twice daily.  Acute metabolic encephalopathy: Likely due to drop in hemoglobin now resolved.  Hypokalemia: Repeated  now resolved.  Essential hypertension: No changes made to his medication.  Depression with anxiety: No change made to her medication.  Discharge Instructions  Discharge Instructions     Diet - low sodium heart healthy   Complete by: As directed    Increase activity slowly   Complete by: As directed       Allergies as  of 07/31/2022       Reactions   Acetaminophen Nausea And Vomiting        Medication List     TAKE these medications    ALPRAZolam 0.5 MG tablet Commonly known as: XANAX Take 1 tablet (0.5 mg total) by mouth at bedtime as needed for anxiety.   cyanocobalamin 1000 MCG tablet Commonly known as: VITAMIN B12 Take 1 tablet (1,000 mcg total) by mouth daily.   FLUoxetine 10 MG capsule Commonly known as: PROZAC Take 10 mg by mouth daily.   folic acid 1 MG tablet Commonly known as: FOLVITE Take 1 tablet (1 mg total) by mouth daily.   gabapentin 100 MG capsule Commonly known as: NEURONTIN Take 100 mg by mouth 3 (three) times daily.   hydrOXYzine 25 MG tablet Commonly known as: ATARAX Take 12.5 mg by mouth at bedtime.   levothyroxine 75 MCG tablet Commonly known as: SYNTHROID Take 75 mcg by mouth daily before breakfast.   Melatonin 10 MG Tabs Take 1 tablet by mouth at bedtime.   pantoprazole 40 MG tablet Commonly known as: PROTONIX Take 1 tablet (40 mg total) by mouth 2 (two) times daily.   spironolactone 25 MG tablet Commonly known as: ALDACTONE Take 25 mg by mouth daily.   SUMAtriptan 100 MG tablet Commonly known as: Imitrex Take 1 tablet (100 mg total) by mouth every 2 (two) hours as needed for migraine. May repeat in 2 hours if headache persists or recurs.   topiramate 50 MG tablet Commonly known as: TOPAMAX Take 50 mg by mouth 2 (two) times daily.   traMADol 50 MG tablet Commonly known as: ULTRAM Take  1 tablet (50 mg total) by mouth every 6 (six) hours as needed for moderate pain.   Vitamin D (Ergocalciferol) 1.25 MG (50000 UNIT) Caps capsule Commonly known as: DRISDOL Take 50,000 Units by mouth every 7 (seven) days.        Allergies  Allergen Reactions   Acetaminophen Nausea And Vomiting    Consultations: Gastroenterology   Procedures/Studies: CT ABDOMEN PELVIS W CONTRAST  Result Date: 07/29/2022 CLINICAL DATA:  Acute abdominal pain with  evidence of hematemesis EXAM: CT ABDOMEN AND PELVIS WITH CONTRAST TECHNIQUE: Multidetector CT imaging of the abdomen and pelvis was performed using the standard protocol following bolus administration of intravenous contrast. RADIATION DOSE REDUCTION: This exam was performed according to the departmental dose-optimization program which includes automated exposure control, adjustment of the mA and/or kV according to patient size and/or use of iterative reconstruction technique. CONTRAST:  175m OMNIPAQUE IOHEXOL 300 MG/ML  SOLN COMPARISON:  01/10/2019 FINDINGS: Lower chest: Lung bases are free of acute infiltrate or sizable effusion. Hepatobiliary: Stable small hypodensity is noted in the dome of the liver best seen on image number 7 of series 2 consistent with a small cysts stable from the prior study. Fatty infiltration of the liver is noted. Gallbladder has been surgically removed. Pancreas: Unremarkable. No pancreatic ductal dilatation or surrounding inflammatory changes. Spleen: Normal in size without focal abnormality. Adrenals/Urinary Tract: Adrenal glands are within normal limits. Kidneys demonstrate a normal enhancement pattern bilaterally. A few scattered small cysts are noted stable in appearance from the prior study. No obstructive changes are seen. The bladder is partially distended. Multiple calcific densities are noted in the dependent portion of the bladder which may be related to prior stone fragments. Bladder is otherwise within normal limits. Stomach/Bowel: No obstructive or inflammatory changes of the colon are seen. The appendix is not well visualized may have been surgically. Small bowel is within normal limits. In the distal gastric antrum there is pooling of contrast material on both the early and delayed phase images consistent with active GI hemorrhage. This likely represents a bleeding gastric ulcer. No perforation is seen. Vascular/Lymphatic: Aortic atherosclerosis. No enlarged abdominal  or pelvic lymph nodes. Reproductive: Status post hysterectomy. No adnexal masses. Other: No abdominal wall hernia or abnormality. No abdominopelvic ascites. Musculoskeletal: Postsurgical changes are noted in the lower lumbar spine. No acute bony abnormality is seen. IMPRESSION: Changes consistent with active GI bleeding in the distal stomach which corresponds with the patient's given clinical history of hematemesis. This may be related to a bleeding gastric ulcer. Direct visualization is recommended. Calcific densities in the posterior aspect of the bladder which may represent small bladder calculi or previous stone fragments. No obstructive changes are noted. Electronically Signed   By: MInez CatalinaM.D.   On: 07/29/2022 22:38   (Echo, Carotid, EGD, Colonoscopy, ERCP)    Subjective: No complaints  Discharge Exam: Vitals:   07/30/22 2006 07/31/22 0244  BP: (!) 153/58 (!) 152/59  Pulse: 86 77  Resp: 20 18  Temp: 99.3 F (37.4 C) 98.9 F (37.2 C)  SpO2: 98% 100%   Vitals:   07/30/22 1601 07/30/22 1803 07/30/22 2006 07/31/22 0244  BP: 127/82 (!) 151/79 (!) 153/58 (!) 152/59  Pulse: 82 86 86 77  Resp: '20 20 20 18  '$ Temp: 98.6 F (37 C) 99.6 F (37.6 C) 99.3 F (37.4 C) 98.9 F (37.2 C)  TempSrc: Oral Oral Oral Oral  SpO2: 100% 100% 98% 100%  Weight:      Height:  General: Pt is alert, awake, not in acute distress Cardiovascular: RRR, S1/S2 +, no rubs, no gallops Respiratory: CTA bilaterally, no wheezing, no rhonchi Abdominal: Soft, NT, ND, bowel sounds + Extremities: no edema, no cyanosis    The results of significant diagnostics from this hospitalization (including imaging, microbiology, ancillary and laboratory) are listed below for reference.     Microbiology: No results found for this or any previous visit (from the past 240 hour(s)).   Labs: BNP (last 3 results) No results for input(s): "BNP" in the last 8760 hours. Basic Metabolic Panel: Recent Labs  Lab  07/29/22 2057 07/30/22 0355 07/31/22 0358  NA 140 142 142  K 3.0* 4.1 3.0*  CL 114* 122* 114*  CO2 20* 17* 22  GLUCOSE 117* 108* 102*  BUN 37* 45* 24*  CREATININE 0.93 0.77 0.79  CALCIUM 10.7* 9.5 10.2  MG 1.8  --   --    Liver Function Tests: Recent Labs  Lab 07/29/22 2057  AST 13*  ALT 13  ALKPHOS 71  BILITOT 0.5  PROT 6.7  ALBUMIN 3.4*   Recent Labs  Lab 07/29/22 2057  LIPASE 38   No results for input(s): "AMMONIA" in the last 168 hours. CBC: Recent Labs  Lab 07/29/22 2057 07/30/22 0601 07/30/22 2013 07/31/22 0358  WBC 11.0* 8.7  --  10.2  NEUTROABS 8.2*  --   --   --   HGB 10.8* 7.1* 10.0* 10.0*  HCT 32.5* 22.2* 28.8* 30.3*  MCV 87.8 90.2  --  85.8  PLT 332 247  --  206   Cardiac Enzymes: No results for input(s): "CKTOTAL", "CKMB", "CKMBINDEX", "TROPONINI" in the last 168 hours. BNP: Invalid input(s): "POCBNP" CBG: No results for input(s): "GLUCAP" in the last 168 hours. D-Dimer No results for input(s): "DDIMER" in the last 72 hours. Hgb A1c No results for input(s): "HGBA1C" in the last 72 hours. Lipid Profile No results for input(s): "CHOL", "HDL", "LDLCALC", "TRIG", "CHOLHDL", "LDLDIRECT" in the last 72 hours. Thyroid function studies No results for input(s): "TSH", "T4TOTAL", "T3FREE", "THYROIDAB" in the last 72 hours.  Invalid input(s): "FREET3" Anemia work up No results for input(s): "VITAMINB12", "FOLATE", "FERRITIN", "TIBC", "IRON", "RETICCTPCT" in the last 72 hours. Urinalysis    Component Value Date/Time   COLORURINE STRAW (A) 05/23/2022 1544   APPEARANCEUR CLEAR 05/23/2022 1544   LABSPEC 1.008 05/23/2022 1544   PHURINE 7.0 05/23/2022 1544   GLUCOSEU NEGATIVE 05/23/2022 1544   HGBUR MODERATE (A) 05/23/2022 1544   BILIRUBINUR NEGATIVE 05/23/2022 1544   KETONESUR NEGATIVE 05/23/2022 1544   PROTEINUR NEGATIVE 05/23/2022 1544   UROBILINOGEN 0.2 08/28/2014 1930   NITRITE NEGATIVE 05/23/2022 1544   LEUKOCYTESUR NEGATIVE 05/23/2022  1544   Sepsis Labs Recent Labs  Lab 07/29/22 2057 07/30/22 0601 07/31/22 0358  WBC 11.0* 8.7 10.2   Microbiology No results found for this or any previous visit (from the past 240 hour(s)).   SIGNED:   Charlynne Cousins, MD  [facility] 07/31/2022, 8:27 AM Pager   If 7PM-7AM, please contact night-coverage [email or web address] Password [email or web address]

## 2022-07-31 NOTE — NC FL2 (Signed)
[place] MEDICAID FL2 LEVEL OF CARE FORM     IDENTIFICATION  Patient Name: Caitlin Irwin Birthdate: [birth year] Sex: female Admission Date (Current Location): 07/29/2022  [facility] and [place] Number:  Herbalist and Address:  [facility],  [address] [place], [place]      Provider Number: [id number]  Attending Physician Name and Address:  Charlynne Cousins, MD  Relative Name and Phone Number:  Henderson Cloud [phone]    Current Level of Care: Hospital Recommended Level of Care: [place] Prior Approval Number:    Date Approved/Denied:   PASRR Number:    Discharge Plan: Other (Comment) (ALF)    Current Diagnoses: Patient Active Problem List   Diagnosis Date Noted   Esophageal stricture 07/30/2022   Hiatal hernia 07/30/2022   Acute gastric ulcer with hemorrhage 07/29/2022   Essential hypertension 07/29/2022   Tibial plateau fracture, left, closed, initial encounter 05/21/2022   Closed displaced fracture of medial condyle of left tibia    Bilateral leg weakness 06/26/2021   Dizziness 06/26/2021   Pre-syncope 12/24/2020   Slurred speech    Transient neurologic deficit 12/22/2020   Anxiety    Orthostasis    Closed fracture of proximal end of right tibia and fibula 11/13/2018   Bilateral temporomandibular joint pain 10/21/2015   Ulceration, tongue traumatic 10/21/2015   Epigastric hernia 09/15/2015   Headache, migraine    Insomnia    Emesis    Microcytic hypochromic anemia    Salicylate poisoning 81/15/7262   Migraine headache 08/28/2014   Hypokalemia 08/28/2014   Aspirin toxicity    Ataxia 03/55/9741   Complicated grief 63/84/5364   Polypharmacy 10/13/2013   Depression with anxiety 10/13/2013   Esophageal reflux 06/12/2012   Trigeminal neuralgia of left side of face 11/07/2011   Hx of migraine headaches 10/20/2011   Dysphagia, unspecified(787.20) 09/26/2011   ULCER-DUODENAL 11/26/2008   History of  peptic ulcer 11/26/2008   Gastritis and gastroduodenitis 11/26/2008   Hypothyroidism 10/03/2007   CONSTIPATION, CHRONIC 10/03/2007   Irritable bowel syndrome 10/03/2007   COLITIS, HX OF 10/03/2007    Orientation RESPIRATION BLADDER Height & Weight     Self, Time, Situation, Place  Normal Continent Weight: 105 lb (47.6 kg) Height:  '4\' 9"'$  (144.8 cm)  BEHAVIORAL SYMPTOMS/MOOD NEUROLOGICAL BOWEL NUTRITION STATUS      Continent Diet (Regular)  AMBULATORY STATUS COMMUNICATION OF NEEDS Skin   Limited Assist Verbally Normal                       Personal Care Assistance Level of Assistance  Bathing, Feeding, Dressing Bathing Assistance: Limited assistance Feeding assistance: Independent Dressing Assistance: Limited assistance     Functional Limitations Info  Sight, Hearing, Speech Sight Info: Adequate Hearing Info: Adequate Speech Info: Adequate    SPECIAL CARE FACTORS FREQUENCY  PT (By licensed PT)     PT Frequency: 2-3x/wk              Contractures Contractures Info: Not present    Additional Factors Info  Code Status, Allergies Code Status Info: FULL Allergies Info: Acetaminophen           Current Medications (07/31/2022):  This is the current hospital active medication list Current Facility-Administered Medications  Medication Dose Route Frequency Provider Last Rate Last Admin   0.9 %  sodium chloride infusion (Manually program via Guardrails IV Fluids)   Intravenous Once Irene Shipper, MD  FLUoxetine (PROZAC) capsule 10 mg  10 mg Oral Daily Charlynne Cousins, MD   10 mg at 07/31/22 1031   gabapentin (NEURONTIN) capsule 100 mg  100 mg Oral TID Charlynne Cousins, MD   100 mg at 07/31/22 1031   hydrOXYzine (ATARAX) tablet 12.5 mg  12.5 mg Oral QHS Charlynne Cousins, MD       levothyroxine (SYNTHROID) tablet 75 mcg  75 mcg Oral QAC breakfast Charlynne Cousins, MD   75 mcg at 07/31/22 1031   melatonin tablet 10 mg  10 mg Oral QHS Charlynne Cousins, MD       melatonin tablet 3 mg  3 mg Oral QHS Irene Shipper, MD   3 mg at 07/30/22 2144   mupirocin ointment (BACTROBAN) 2 % 1 Application  1 Application Nasal BID Charlynne Cousins, MD   1 Application at 24/40/10 1031   ondansetron (ZOFRAN) tablet 4 mg  4 mg Oral Q6H PRN Irene Shipper, MD       Or   ondansetron [facility]) injection 4 mg  4 mg Intravenous Q6H PRN Irene Shipper, MD       pantoprazole (PROTONIX) EC tablet 40 mg  40 mg Oral BID Charlynne Cousins, MD   40 mg at 07/31/22 1031   potassium chloride SA (KLOR-CON M) CR tablet 40 mEq  40 mEq Oral BID Charlynne Cousins, MD   40 mEq at 07/31/22 1031   QUEtiapine (SEROQUEL) tablet 25 mg  25 mg Oral QHS PRN Irene Shipper, MD       sodium chloride flush (NS) 0.9 % injection 3 mL  3 mL Intravenous Q12H Irene Shipper, MD   3 mL at 07/31/22 1033   spironolactone (ALDACTONE) tablet 25 mg  25 mg Oral Daily Charlynne Cousins, MD   25 mg at 07/31/22 1031   SUMAtriptan (IMITREX) tablet 100 mg  100 mg Oral Q2H PRN Charlynne Cousins, MD       topiramate (TOPAMAX) tablet 50 mg  50 mg Oral BID Charlynne Cousins, MD   50 mg at 07/31/22 1031   traMADol (ULTRAM) tablet 50 mg  50 mg Oral Q6H PRN Charlynne Cousins, MD         Discharge Medications: Please see discharge summary for a list of discharge medications.  Relevant Imaging Results:  Relevant Lab Results:   Additional Information SS# [id number] LCSW

## 2022-07-31 NOTE — Evaluation (Addendum)
Physical Therapy Evaluation Patient Details Name: Caitlin Irwin MRN: [id number] DOB: [birth year] Today's Date: 07/31/2022  History of Present Illness  71 yo female admitted with acute gastric ulcer with hemorrhage. Hx of L tibial platau fx s/p ORIF 05/22/2022, LBP, ataxia, trigeminal neuralgia, multiple fractures and surgeries, memory loss, migraines.  Clinical Impression  On eval, pt was Min A for mobility. She was able to pivot over to recliner using a RW. No family was present during session. I attempted to call [facility] multiple times to speak with a staff member who could provide PLOF info-unable to get any info. Pt reports she last saw a physician ~3 weeks prior to this admission (concerning her L LE). Pt reports she was told to mobilize with "toe touch" WBing on L. She reports she has been nonambulatory since surgery. She may be an unreliable historian. She reports she doesn't receive any help from staff except for meal delivery. When asked how she get to bathroom at facility, she states she cannot remember. Recommend return to ALF. Will recommend HHPT f/u-unsure if pt has had PT at ALF prior.        Recommendations for follow up therapy are one component of a multi-disciplinary discharge planning process, led by the attending physician.  Recommendations may be updated based on patient status, additional functional criteria and insurance authorization.  Follow Up Recommendations Home health PT (at ALF)      Assistance Recommended at Discharge Frequent or constant Supervision/Assistance  Patient can return home with the following  Assistance with cooking/housework;Assist for transportation;Direct supervision/assist for financial management;Help with stairs or ramp for entrance;A little help with walking and/or transfers;A little help with bathing/dressing/bathroom    Equipment Recommendations None recommended by PT  Recommendations for Other Services       Functional Status  Assessment Patient has had a recent decline in their functional status and demonstrates the ability to make significant improvements in function in a reasonable and predictable amount of time.     Precautions / Restrictions Precautions Precautions: Fall Restrictions Weight Bearing Restrictions: Yes LLE Weight Bearing: Touchdown weight bearing Other Position/Activity Restrictions: "toe-touch" (pt reports she last visited MD ~ 3 weeks prior to this admission and she was instructed to mobilize using toe touch WB).      Mobility  Bed Mobility Overal bed mobility: Modified Independent                  Transfers Overall transfer level: Needs assistance Equipment used: Rolling walker (2 wheels) Transfers: Sit to/from Stand, Bed to chair/wheelchair/BSC Sit to Stand: Min assist Stand pivot transfers: Min assist         General transfer comment: Stand pivot with use of RW. Cues for sequencing, technique, adherence to limited WB status on L. Pt has difficulty maintaining TDWB.    Ambulation/Gait               General Gait Details: Deferred further mobility due to WB restrictions reported by patient  Stairs            Wheelchair Mobility    Modified Rankin (Stroke Patients Only)       Balance Overall balance assessment: Needs assistance, History of Falls         Standing balance support: Bilateral upper extremity supported, During functional activity, Reliant on assistive device for balance Standing balance-Leahy Scale: Poor  Pertinent Vitals/Pain Pain Assessment Pain Assessment: Faces Faces Pain Scale: Hurts a little bit Pain Location: L LE Pain Descriptors / Indicators: Discomfort, Sore, Aching Pain Intervention(s): Limited activity within patient's tolerance, Repositioned    Home Living Family/patient expects to be discharged to:: Assisted living                        Prior Function                Mobility Comments: pt is an unreliable historian due to memory issues. she reported she doesn't receive any help from staff at [facility] but she also states that she can't do very much since the fracture in October??? ADLs Comments: likely needs assist     Hand Dominance        Extremity/Trunk Assessment   Upper Extremity Assessment Upper Extremity Assessment: Overall WFL for tasks assessed    Lower Extremity Assessment Lower Extremity Assessment: Generalized weakness    Cervical / Trunk Assessment Cervical / Trunk Assessment: Kyphotic  Communication   Communication: No difficulties  Cognition Arousal/Alertness: Awake/alert Behavior During Therapy: WFL for tasks assessed/performed Overall Cognitive Status: History of cognitive impairments - at baseline                                          General Comments      Exercises     Assessment/Plan    PT Assessment Patient needs continued PT services  PT Problem List Decreased strength;Decreased activity tolerance;Decreased balance;Decreased mobility;Decreased cognition;Decreased knowledge of use of DME;Decreased knowledge of precautions       PT Treatment Interventions DME instruction;Gait training;Therapeutic exercise;Balance training;Functional mobility training;Therapeutic activities;Patient/family education    PT Goals (Current goals can be found in the Care Plan section)  Acute Rehab PT Goals Patient Stated Goal: none stated PT Goal Formulation: With patient Time For Goal Achievement: 08/14/22 Potential to Achieve Goals: Fair    Frequency Min 3X/week     Co-evaluation               AM-PAC PT "6 Clicks" Mobility  Outcome Measure Help needed turning from your back to your side while in a flat bed without using bedrails?: None Help needed moving from lying on your back to sitting on the side of a flat bed without using bedrails?: None Help needed moving to and from a bed  to a chair (including a wheelchair)?: A Little Help needed standing up from a chair using your arms (e.g., wheelchair or bedside chair)?: A Little Help needed to walk in hospital room?: Total Help needed climbing 3-5 steps with a railing? : Total 6 Click Score: 16    End of Session Equipment Utilized During Treatment: Gait belt Activity Tolerance: Patient tolerated treatment well Patient left: in chair;with call bell/phone within reach;with chair alarm set   PT Visit Diagnosis: Pain;Repeated falls (R29.6);History of falling (Z91.81);Other abnormalities of gait and mobility (R26.89);Difficulty in walking, not elsewhere classified (R26.2) Pain - Right/Left: Left Pain - part of body: Leg    Time: 8366-2947 PT Time Calculation (min) (ACUTE ONLY): 14 min   Charges:   PT Evaluation $PT Eval Low Complexity: [place], PT Acute Rehabilitation  Office: [phone]

## 2022-07-31 NOTE — TOC Transition Note (Signed)
Transition of Care [facility]) - CM/SW Discharge Note   Patient Details  Name: Caitlin Irwin MRN: [id number] Date of Birth: November 12, 1950  [facility]) CM/SW Contact:  Vassie Moselle, LCSW Phone Number: 07/31/2022, 1:43 PM   Clinical Narrative:    Pt is to return to [facility] and will be receiving HHPT through [facility]. Pt will be going to room 138. RN to call report to [phone]. PTAR called at 1:45pm.    Final next level of care: Assisted Living (w/ home health) Barriers to Discharge: No Barriers Identified   Patient Goals and CMS Choice CMS Medicare.gov Compare Post Acute Care list provided to:: Patient Choice offered to / list presented to : Patient  Discharge Placement                         Discharge Plan and Services Additional resources added to the After Visit Summary for                  DME Arranged: N/A DME Agency: NA       HH Arranged: PT HH Agency: [place] Date [place]: 07/31/22 Time [place]: [place] Representative spoke with at [place]: [place] Determinants of Health ([place]) Interventions [place]: No Food Insecurity (07/30/2022)  Housing: Low Risk  (07/30/2022)  Transportation Needs: No Transportation Needs (07/30/2022)  Utilities: Not At Risk (07/30/2022)  Tobacco Use: Low Risk  (07/30/2022)     Readmission Risk Interventions    07/31/2022    1:42 PM  Readmission Risk Prevention Plan  Transportation Screening Complete  PCP or Specialist Appt within 3-5 Days Complete  HRI or [place] Complete  Social Work Consult for [place] Planning/Counseling Complete  Palliative Care Screening Not Applicable  Medication Review Press photographer) Complete

## 2022-07-31 NOTE — Progress Notes (Signed)
Patient is alert, verbal  she is refusing to wear telemetry and SCD's despite rationale. NP made aware. Will continue to observe and encourage compliance. Patient is in no distress, in bed watching television. Safety measures in place, call bell is within patients reach.

## 2022-07-31 NOTE — Progress Notes (Signed)
HISTORY OF PRESENT ILLNESS:  Caitlin Irwin is a 71 y.o. female was admitted with hematemesis.  Underwent upper endoscopy yesterday and was found to have a gastric ulcer with stigmata which was treated with endoscopic hemostatic therapy.  Biopsies for H. pylori are pending.  She has been stable.  No further bleeding.  Stable hemoglobin.  Primary service is discharging her back to her intermediate care rehabilitation center today.  REVIEW OF SYSTEMS:  All non-GI ROS negative except for dry lips  Past Medical History:  Diagnosis Date   Accidental poisoning by aspirin 09/2014   Anemia    Anxiety    Arthritis    "right knee; right shoulder" (09/15/2015)   Ataxia 03/24/2014   Cataract    both eyes   Chronic lower back pain    scoliosis   Depression    GERD (gastroesophageal reflux disease)    H/O hiatal hernia    High cholesterol    History of blood transfusion    "related to OR"   Hypersomnia    IBS (irritable bowel syndrome)    Migraines    "maybe once/week now" (09/15/2015)   Osteoporosis    Other constipation    Parotid tumor    PONV (postoperative nausea and vomiting)    hx of " getting too much" - 1998, slow to take up by body    PUD (peptic ulcer disease)    Trigeminal neuralgia    S/P radiation therapy    Unspecified hypothyroidism     Past Surgical History:  Procedure Laterality Date   ABDOMINAL HYSTERECTOMY     ANKLE FRACTURE SURGERY Left    ANTERIOR LUMBAR FUSION  04/2010   L4-5; L5-S1   BACK SURGERY     BRAIN SURGERY     parotid tumor removed    CHOLECYSTECTOMY OPEN     EPIGASTRIC HERNIA REPAIR  09/15/2015   Procedure: OPEN HERNIA REPAIR EPIGASTRIC ADULT;  Surgeon: Greer Pickerel, MD;  Location: [place];  Service: General;;   ESOPHAGOGASTRODUODENOSCOPY  06/12/2012   Procedure: ESOPHAGOGASTRODUODENOSCOPY (EGD);  Surgeon: Lear Ng, MD;  Location: [facility];  Service: Endoscopy;  Laterality: N/A;   EXTERNAL FIXATION LEG Right 11/14/2018   Procedure:  EXTERNAL FIXATION LEG;  Surgeon: Hiram Gash, MD;  Location: [place];  Service: Orthopedics;  Laterality: Right;   FRACTURE SURGERY     gamma knife for trig neuralgia  12/27/11   HERNIA REPAIR     KNEE CARTILAGE SURGERY Right 1981   "converted from scope to open during the OR; ligament repair"   LAMINECTOMY AND MICRODISCECTOMY LUMBAR SPINE  1980   L4-5   LAPAROSCOPIC EPIGASTRIC HERNIA REPAIR  09/15/2015   open   ORIF TIBIA PLATEAU Right 11/16/2018   Procedure: OPEN REDUCTION INTERNAL FIXATION (ORIF) TIBIAL PLATEAU;  Surgeon: Hiram Gash, MD;  Location: [place];  Service: Orthopedics;  Laterality: Right;   ORIF TIBIA PLATEAU Left 05/22/2022   Procedure: OPEN REDUCTION INTERNAL FIXATION (ORIF) TIBIAL PLATEAU;  Surgeon: Callie Fielding, MD;  Location: [facility];  Service: Orthopedics;  Laterality: Left;   PAROTID GLAND TUMOR EXCISION     benign   SHOULDER ARTHROSCOPY W/ ROTATOR CUFF REPAIR Right 11-2014   TRIGEMINAL NERVE DECOMPRESSION     to stop migraines and it did not work     Social History Caitlin Irwin  reports that she has never smoked. She has never used smokeless tobacco. She reports that she does not drink alcohol and does not use drugs.  family history includes Diabetes in her father; Heart disease in her maternal grandfather and another family member; Migraines in her brother and mother; Ovarian cancer in her mother.  Allergies  Allergen Reactions   Acetaminophen Nausea And Vomiting       PHYSICAL EXAMINATION: Vital signs: BP (!) 158/68 (BP Location: Right Arm)   Pulse 82   Temp 98.5 F (36.9 C) (Oral)   Resp (!) 22   Ht '4\' 9"'$  (1.448 m)   Wt 47.6 kg   SpO2 99%   BMI 22.72 kg/m  General: Well-developed, well-nourished, no acute distress HEENT: Sclerae are anicteric, conjunctiva pink. Oral mucosa intact Lungs: Clear Heart: Regular Abdomen: soft, nontender, nondistended, no obvious ascites, no peritoneal signs, normal bowel sounds. No organomegaly. Extremities: No  edema Psychiatric: alert and oriented x3. Cooperative     ASSESSMENT:  1.  Bleeding antral ulcer status post endoscopic hemostatic therapy.  No recurrent bleeding.  Stable.  Plan for discharge 2.  General medical problems   PLAN:  1.  Avoid all NSAIDs indefinitely 2.  Pantoprazole 40 mg p.o. twice daily for 8 weeks then 40 mg p.o. daily indefinitely 3.  I will send a note to her primary GI, Dr. Silverio Decamp, to arrange outpatient GI follow-up in 4 to 6 weeks.  Thereafter, they might arrange follow-up upper endoscopy to assess for ulcer healing. Discussed with patient.  Will sign off,  Geovannie Vilar N. Geri Seminole., M.D. [facility] Division of Gastroenterology

## 2022-07-31 NOTE — Progress Notes (Signed)
Patient discharging back to [facility], report called to [facility] at facility.  Patient aware and agreeable, AVS placed in packet.  IV removed - WNL.  Patient in NAD awaiting arrival of [facility] for transport

## 2022-08-02 LAB — SURGICAL PATHOLOGY

## 2022-08-03 ENCOUNTER — Telehealth: Payer: Self-pay

## 2022-08-03 NOTE — Telephone Encounter (Signed)
-----   Message from Mauri Pole, MD sent at 08/03/2022 11:37 AM EST ----- Regarding: RE: Needs follow-up [place], thank you Beth, can you please schedule follow up visit with me or APP next available ? ----- Message ----- From: Irene Shipper, MD Sent: 07/31/2022   2:42 PM EST To: Mauri Pole, MD Subject: Needs follow-up                                Veena,  I saw this patient for bleeding gastric ulcer.  He is going back to her intermediate care center (recovering from orthopedic surgery) today.  Looks good.  She will need office follow-up in 4 to 6 weeks with yourself or one of the advanced practitioners.  I would recommend follow-up upper endoscopy (she is on twice daily pantoprazole) in about 8 weeks or so to assess for ulcer healing.  Thanks and happy new year,  Jenny Reichmann

## 2022-08-03 NOTE — Telephone Encounter (Signed)
Spoke with the patient who asked I speak with her sister in law, Mrs. Lauretta Grill.  Called Ms Lauretta Grill. No answer. Left a detailed message asking for a return call to schedule an appointment for office to follow up after patient hospitalization and EGD. The appointment needs to be in the next 3 to 4 weeks if possible with Dr Silverio Decamp. Other option is to schedule with an APP.

## 2022-08-03 NOTE — Telephone Encounter (Signed)
Inbound call from patient sister in law returning call . Please advise

## 2022-08-08 NOTE — Telephone Encounter (Signed)
Patient will have transportation 09/12/22 for a 1:30 pm appointment with Dr Silverio Decamp. Planned with the sister in law Mrs. Lauretta Grill.

## 2022-08-10 DIAGNOSIS — K219 Gastro-esophageal reflux disease without esophagitis: Secondary | ICD-10-CM | POA: Diagnosis not present

## 2022-08-10 DIAGNOSIS — M1711 Unilateral primary osteoarthritis, right knee: Secondary | ICD-10-CM | POA: Diagnosis not present

## 2022-08-10 DIAGNOSIS — K589 Irritable bowel syndrome without diarrhea: Secondary | ICD-10-CM | POA: Diagnosis not present

## 2022-08-10 DIAGNOSIS — Z9181 History of falling: Secondary | ICD-10-CM | POA: Diagnosis not present

## 2022-08-10 DIAGNOSIS — F32A Depression, unspecified: Secondary | ICD-10-CM | POA: Diagnosis not present

## 2022-08-10 DIAGNOSIS — G43909 Migraine, unspecified, not intractable, without status migrainosus: Secondary | ICD-10-CM | POA: Diagnosis not present

## 2022-08-10 DIAGNOSIS — G894 Chronic pain syndrome: Secondary | ICD-10-CM | POA: Diagnosis not present

## 2022-08-10 DIAGNOSIS — Z981 Arthrodesis status: Secondary | ICD-10-CM | POA: Diagnosis not present

## 2022-08-10 DIAGNOSIS — F419 Anxiety disorder, unspecified: Secondary | ICD-10-CM | POA: Diagnosis not present

## 2022-08-10 DIAGNOSIS — M80061D Age-related osteoporosis with current pathological fracture, right lower leg, subsequent encounter for fracture with routine healing: Secondary | ICD-10-CM | POA: Diagnosis not present

## 2022-08-10 DIAGNOSIS — G500 Trigeminal neuralgia: Secondary | ICD-10-CM | POA: Diagnosis not present

## 2022-08-10 DIAGNOSIS — I10 Essential (primary) hypertension: Secondary | ICD-10-CM | POA: Diagnosis not present

## 2022-08-10 DIAGNOSIS — Z791 Long term (current) use of non-steroidal anti-inflammatories (NSAID): Secondary | ICD-10-CM | POA: Diagnosis not present

## 2022-08-10 DIAGNOSIS — M19011 Primary osteoarthritis, right shoulder: Secondary | ICD-10-CM | POA: Diagnosis not present

## 2022-08-10 DIAGNOSIS — E039 Hypothyroidism, unspecified: Secondary | ICD-10-CM | POA: Diagnosis not present

## 2022-08-10 DIAGNOSIS — Z79891 Long term (current) use of opiate analgesic: Secondary | ICD-10-CM | POA: Diagnosis not present

## 2022-08-10 DIAGNOSIS — D529 Folate deficiency anemia, unspecified: Secondary | ICD-10-CM | POA: Diagnosis not present

## 2022-08-10 DIAGNOSIS — M545 Low back pain, unspecified: Secondary | ICD-10-CM | POA: Diagnosis not present

## 2022-08-15 ENCOUNTER — Ambulatory Visit (INDEPENDENT_AMBULATORY_CARE_PROVIDER_SITE_OTHER): Payer: PPO | Admitting: Orthopedic Surgery

## 2022-08-15 ENCOUNTER — Ambulatory Visit (INDEPENDENT_AMBULATORY_CARE_PROVIDER_SITE_OTHER): Payer: PPO

## 2022-08-15 DIAGNOSIS — S82142D Displaced bicondylar fracture of left tibia, subsequent encounter for closed fracture with routine healing: Secondary | ICD-10-CM

## 2022-08-15 NOTE — Progress Notes (Signed)
Orthopedic Surgery Progress Note     Assessment: Patient is a 72 y.o. female with left tibial plateau fracture status post ORIF (05/22/2022 ~ 3 months from surgery)     Plan: -Weight-bear as tolerated left lower extremity -Provided her with some home exercises to do to work on strengthening -Use OTC pain medication and voltaren gel for pain relief -Does not need further DVT prophylaxis as she is weightbearing as tolerated -Follow up in 12 weeks (6 months from surgery) with AP/lateral left knee XR   ___________________________________________________________________________   Subjective: Not having pain in the left knee.  Has been doing home exercises and strengthening when in bed.  Has not noticed any redness or drainage from the incision.  Denies paresthesias and numbness.     Physical Exam:   General: no acute distress, appears stated age Neurologic: alert, answering questions appropriately, following commands Respiratory: unlabored breathing on room air, symmetric chest rise Psychiatric: appropriate affect, normal cadence to speech   The medial and lateral incisions over the anterior knee appear well-healed with no evidence of infection  Was able to walk the halls without assistive device today in the office  MSK:    -Left lower extremity             Knee ROM from 0-100 actively Fires hip flexors, quadriceps, hamstrings, tibialis anterior, gastrocnemius and soleus, extensor hallucis longus Plantarflexes and dorsiflexes toes Sensation intact to light touch in sural, saphenous, tibial, deep peroneal, and superficial peroneal nerve distributions Foot warm and well perfused   XRs of the left knee from 08/15/2022 were independently reviewed today showing alignment maintained.  No lucency seen around the screws.  Interval healing seen at the fracture site.  Shortening is noted on the contralateral side with lateral joint space narrowing.     Patient name: Caitlin Irwin Patient MRN: [id number] Date: 08/15/22

## 2022-08-17 ENCOUNTER — Telehealth: Payer: Self-pay | Admitting: Orthopedic Surgery

## 2022-08-17 NOTE — Telephone Encounter (Signed)
Received call from Clare Gandy (PT) with [facility] needing weight bearing status on the patient. Spoke with [place] and and advised Clare Gandy weight bearing status is as tolerated per Dr. Laurance Flatten

## 2022-08-25 DIAGNOSIS — E039 Hypothyroidism, unspecified: Secondary | ICD-10-CM | POA: Diagnosis not present

## 2022-08-25 DIAGNOSIS — D529 Folate deficiency anemia, unspecified: Secondary | ICD-10-CM | POA: Diagnosis not present

## 2022-08-25 DIAGNOSIS — K219 Gastro-esophageal reflux disease without esophagitis: Secondary | ICD-10-CM | POA: Diagnosis not present

## 2022-08-25 DIAGNOSIS — K589 Irritable bowel syndrome without diarrhea: Secondary | ICD-10-CM | POA: Diagnosis not present

## 2022-08-25 DIAGNOSIS — F419 Anxiety disorder, unspecified: Secondary | ICD-10-CM | POA: Diagnosis not present

## 2022-08-25 DIAGNOSIS — M1711 Unilateral primary osteoarthritis, right knee: Secondary | ICD-10-CM | POA: Diagnosis not present

## 2022-08-25 DIAGNOSIS — G43909 Migraine, unspecified, not intractable, without status migrainosus: Secondary | ICD-10-CM | POA: Diagnosis not present

## 2022-08-25 DIAGNOSIS — G500 Trigeminal neuralgia: Secondary | ICD-10-CM | POA: Diagnosis not present

## 2022-08-25 DIAGNOSIS — M19011 Primary osteoarthritis, right shoulder: Secondary | ICD-10-CM | POA: Diagnosis not present

## 2022-08-25 DIAGNOSIS — I10 Essential (primary) hypertension: Secondary | ICD-10-CM | POA: Diagnosis not present

## 2022-08-25 DIAGNOSIS — F32A Depression, unspecified: Secondary | ICD-10-CM | POA: Diagnosis not present

## 2022-08-25 DIAGNOSIS — M80061D Age-related osteoporosis with current pathological fracture, right lower leg, subsequent encounter for fracture with routine healing: Secondary | ICD-10-CM | POA: Diagnosis not present

## 2022-08-25 DIAGNOSIS — Z9181 History of falling: Secondary | ICD-10-CM | POA: Diagnosis not present

## 2022-08-25 DIAGNOSIS — Z79891 Long term (current) use of opiate analgesic: Secondary | ICD-10-CM | POA: Diagnosis not present

## 2022-08-25 DIAGNOSIS — M545 Low back pain, unspecified: Secondary | ICD-10-CM | POA: Diagnosis not present

## 2022-08-25 DIAGNOSIS — G894 Chronic pain syndrome: Secondary | ICD-10-CM | POA: Diagnosis not present

## 2022-08-25 DIAGNOSIS — Z791 Long term (current) use of non-steroidal anti-inflammatories (NSAID): Secondary | ICD-10-CM | POA: Diagnosis not present

## 2022-08-25 DIAGNOSIS — Z981 Arthrodesis status: Secondary | ICD-10-CM | POA: Diagnosis not present

## 2022-09-06 ENCOUNTER — Other Ambulatory Visit: Payer: Self-pay | Admitting: Internal Medicine

## 2022-09-06 DIAGNOSIS — M810 Age-related osteoporosis without current pathological fracture: Secondary | ICD-10-CM | POA: Diagnosis not present

## 2022-09-06 DIAGNOSIS — G500 Trigeminal neuralgia: Secondary | ICD-10-CM | POA: Diagnosis not present

## 2022-09-06 DIAGNOSIS — Z8711 Personal history of peptic ulcer disease: Secondary | ICD-10-CM | POA: Diagnosis not present

## 2022-09-06 DIAGNOSIS — D649 Anemia, unspecified: Secondary | ICD-10-CM | POA: Diagnosis not present

## 2022-09-06 DIAGNOSIS — G43009 Migraine without aura, not intractable, without status migrainosus: Secondary | ICD-10-CM | POA: Diagnosis not present

## 2022-09-06 DIAGNOSIS — Z9181 History of falling: Secondary | ICD-10-CM | POA: Diagnosis not present

## 2022-09-06 DIAGNOSIS — I10 Essential (primary) hypertension: Secondary | ICD-10-CM | POA: Diagnosis not present

## 2022-09-06 DIAGNOSIS — R413 Other amnesia: Secondary | ICD-10-CM | POA: Diagnosis not present

## 2022-09-06 DIAGNOSIS — F339 Major depressive disorder, recurrent, unspecified: Secondary | ICD-10-CM | POA: Diagnosis not present

## 2022-09-06 DIAGNOSIS — F419 Anxiety disorder, unspecified: Secondary | ICD-10-CM | POA: Diagnosis not present

## 2022-09-12 ENCOUNTER — Encounter: Payer: Self-pay | Admitting: Gastroenterology

## 2022-09-12 ENCOUNTER — Ambulatory Visit (INDEPENDENT_AMBULATORY_CARE_PROVIDER_SITE_OTHER): Payer: PPO | Admitting: Gastroenterology

## 2022-09-12 VITALS — BP 118/68 | HR 73 | Ht 59.0 in | Wt 104.4 lb

## 2022-09-12 DIAGNOSIS — Z8711 Personal history of peptic ulcer disease: Secondary | ICD-10-CM | POA: Diagnosis not present

## 2022-09-12 DIAGNOSIS — R1013 Epigastric pain: Secondary | ICD-10-CM

## 2022-09-12 NOTE — Patient Instructions (Signed)
You have been scheduled for an endoscopy. Please follow written instructions given to you at your visit today. If you use inhalers (even only as needed), please bring them with you on the day of your procedure.  The [facility] providers would like to encourage you to use [facility] to communicate with providers for non-urgent requests or questions.  Due to long hold times on the telephone, sending your provider a message by [facility] may be a faster and more efficient way to get a response.  Please allow 48 business hours for a response.  Please remember that this is for non-urgent requests.   Due to recent changes in healthcare laws, you may see the results of your imaging and laboratory studies on MyChart before your provider has had a chance to review them.  We understand that in some cases there may be results that are confusing or concerning to you. Not all laboratory results come back in the same time frame and the provider may be waiting for multiple results in order to interpret others.  Please give [place] 48 hours in order for your provider to thoroughly review all the results before contacting the office for clarification of your results.

## 2022-09-12 NOTE — Progress Notes (Unsigned)
Caitlin Irwin    [id number]    Jul 28, 1951  Primary Care Physician:Wile, [name], MD  Referring Physician: Michael Boston, MD [address] [place],  [place] [id number]   Chief complaint: History of gastric ulcers  HPI:  72 year old very pleasant female here for gastric ulcers.  She was recently hospitalized in December with acute upper GI bleed. EGD July 30, 2022 by Dr. Henrene Pastor showed nonbleeding cratered gastric ulcers with the largest lesion 15 mm.  Hemostasis was achieved with epinephrine and BiCap. Biopsies negative for H. Pylori  She recently underwent orthopedic surgery for broken foot in October 2023, was taking high-dose aspirin. She is history pyloric channel  ulcer in 2011  Denies any melena, rectal bleeding, abdominal, nausea, vomiting.  Review of system positive for decreased appetite, no unintentional weight loss.  She is able to bear weight and is walking with the help of her walker  Outpatient Encounter Medications as of 09/12/2022  Medication Sig   FLUoxetine (PROZAC) 10 MG capsule Take 10 mg by mouth daily.   folic acid (FOLVITE) 1 MG tablet Take 1 tablet (1 mg total) by mouth daily.   gabapentin (NEURONTIN) 100 MG capsule Take 100 mg by mouth 3 (three) times daily.   hydrOXYzine (ATARAX) 25 MG tablet Take 12.5 mg by mouth at bedtime.   levothyroxine (SYNTHROID, LEVOTHROID) 75 MCG tablet Take 75 mcg by mouth daily before breakfast.    Melatonin 10 MG TABS Take 1 tablet by mouth at bedtime.   pantoprazole (PROTONIX) 40 MG tablet Take 1 tablet (40 mg total) by mouth 2 (two) times daily.   spironolactone (ALDACTONE) 25 MG tablet Take 25 mg by mouth daily.   SUMAtriptan (IMITREX) 100 MG tablet Take 1 tablet (100 mg total) by mouth every 2 (two) hours as needed for migraine. May repeat in 2 hours if headache persists or recurs.   topiramate (TOPAMAX) 50 MG tablet Take 50 mg by mouth 2 (two) times daily.   traMADol (ULTRAM) 50 MG tablet Take 1 tablet (50  mg total) by mouth every 6 (six) hours as needed for moderate pain.   vitamin B-12 (CYANOCOBALAMIN) 1000 MCG tablet Take 1 tablet (1,000 mcg total) by mouth daily.   Vitamin D, Ergocalciferol, (DRISDOL) 1.25 MG (50000 UNIT) CAPS capsule Take 50,000 Units by mouth every 7 (seven) days.   ALPRAZolam (XANAX) 0.5 MG tablet Take 1 tablet (0.5 mg total) by mouth at bedtime as needed for anxiety. (Patient not taking: Reported on 07/31/2022)   [DISCONTINUED] eletriptan (RELPAX) 40 MG tablet One tablet by mouth as needed for migraine headache.  If the headache improves and then returns, dose may be repeated after 2 hours have elapsed since first dose (do not exceed 80 mg per day). may repeat in 2 hours if necessary   [DISCONTINUED] ferrous sulfate 325 (65 FE) MG tablet Take 325 mg by mouth daily with breakfast.   No facility-administered encounter medications on file as of 09/12/2022.    Allergies as of 09/12/2022 - Review Complete 09/12/2022  Allergen Reaction Noted   Acetaminophen Nausea And Vomiting     Past Medical History:  Diagnosis Date   Accidental poisoning by aspirin 09/01/2014   Anemia    Anxiety    Arthritis    "right knee; right shoulder" (09/15/2015)   Ataxia 03/24/2014   Broken leg 05/2022   Cataract    both eyes   Chronic lower back pain    scoliosis  Depression    GERD (gastroesophageal reflux disease)    H/O hiatal hernia    High cholesterol    History of blood transfusion    "related to OR"   Hypersomnia    IBS (irritable bowel syndrome)    Migraines    "maybe once/week now" (09/15/2015)   Osteoporosis    Other constipation    Parotid tumor    PONV (postoperative nausea and vomiting)    hx of " getting too much" - 1998, slow to take up by body    PUD (peptic ulcer disease)    Trigeminal neuralgia    S/P radiation therapy    Unspecified hypothyroidism     Past Surgical History:  Procedure Laterality Date   ABDOMINAL HYSTERECTOMY     ANKLE FRACTURE SURGERY  Left    ANTERIOR LUMBAR FUSION  04/2010   L4-5; L5-S1   BACK SURGERY     BIOPSY  07/30/2022   Procedure: BIOPSY;  Surgeon: Irene Shipper, MD;  Location: [facility];  Service: Gastroenterology;;   BRAIN SURGERY     parotid tumor removed    CHOLECYSTECTOMY OPEN     EPIGASTRIC HERNIA REPAIR  09/15/2015   Procedure: OPEN HERNIA REPAIR EPIGASTRIC ADULT;  Surgeon: Greer Pickerel, MD;  Location: [place];  Service: General;;   ESOPHAGOGASTRODUODENOSCOPY  06/12/2012   Procedure: ESOPHAGOGASTRODUODENOSCOPY (EGD);  Surgeon: Lear Ng, MD;  Location: [facility];  Service: Endoscopy;  Laterality: N/A;   ESOPHAGOGASTRODUODENOSCOPY (EGD) WITH PROPOFOL N/A 07/30/2022   Procedure: ESOPHAGOGASTRODUODENOSCOPY (EGD) WITH PROPOFOL;  Surgeon: Irene Shipper, MD;  [facility];  Service: Gastroenterology;  Laterality: N/A;   EXTERNAL FIXATION LEG Right 11/14/2018   Procedure: EXTERNAL FIXATION LEG;  Surgeon: Hiram Gash, MD;  Location: [place];  Service: Orthopedics;  Laterality: Right;   FRACTURE SURGERY     gamma knife for trig neuralgia  12/27/11   HERNIA REPAIR     HOT HEMOSTASIS N/A 07/30/2022   Procedure: HOT HEMOSTASIS (ARGON PLASMA COAGULATION/BICAP);  Surgeon: Irene Shipper, MD;  Location: [facility];  Service: Gastroenterology;  Laterality: N/A;   KNEE CARTILAGE SURGERY Right 1981   "converted from scope to open during the OR; ligament repair"   LAMINECTOMY AND MICRODISCECTOMY LUMBAR SPINE  1980   L4-5   LAPAROSCOPIC EPIGASTRIC HERNIA REPAIR  09/15/2015   open   ORIF TIBIA PLATEAU Right 11/16/2018   Procedure: OPEN REDUCTION INTERNAL FIXATION (ORIF) TIBIAL PLATEAU;  Surgeon: Hiram Gash, MD;  Location: [place];  Service: Orthopedics;  Laterality: Right;   ORIF TIBIA PLATEAU Left 05/22/2022   Procedure: OPEN REDUCTION INTERNAL FIXATION (ORIF) TIBIAL PLATEAU;  Surgeon: Callie Fielding, MD;  [facility];  Service: Orthopedics;  Laterality: Left;   PAROTID GLAND TUMOR EXCISION      benign   SCLEROTHERAPY  07/30/2022   Procedure: SCLEROTHERAPY;  Surgeon: Irene Shipper, MD;  [facility];  Service: Gastroenterology;;   SHOULDER ARTHROSCOPY W/ ROTATOR CUFF REPAIR Right 11-2014   TRIGEMINAL NERVE DECOMPRESSION     to stop migraines and it did not work     Family History  Problem Relation Age of Onset   Ovarian cancer Mother    Migraines Mother    Diabetes Father    Migraines Brother    Heart disease Other    Heart disease Maternal Grandfather    Colon cancer Neg Hx    Esophageal cancer Neg Hx     Social History   Socioeconomic History  Marital status: Widowed    Spouse name: Not on file   Number of children: 0   Years of education: college   Highest education level: Not on file  Occupational History   Occupation: disabled  Tobacco Use   Smoking status: Never   Smokeless tobacco: Never  Vaping Use   Vaping Use: Never used  Substance and Sexual Activity   Alcohol use: No   Drug use: No   Sexual activity: Not Currently  Other Topics Concern   Not on file  Social History Narrative   Patient lives at home alone.    Patient husband past away 07-03-13.   Patient is widowed.   Patient is on disability.    Patient is right handed.   Patient has no children.   Patient is a college grad.   Social Determinants of Health   Financial Resource Strain: Not on file  Food Insecurity: No Food Insecurity (07/30/2022)   Hunger Vital Sign    Worried About Running Out of Food in the Last Year: Never true    Ran Out of Food in the Last Year: Never true  Transportation Needs: No Transportation Needs (07/30/2022)   PRAPARE - Hydrologist (Medical): No    Lack of Transportation (Non-Medical): No  Physical Activity: Not on file  Stress: Not on file  Social Connections: Not on file  Intimate Partner Violence: Not At Risk (07/30/2022)   Humiliation, Afraid, Rape, and Kick questionnaire    Fear of Current or Ex-Partner: No     Emotionally Abused: No    Physically Abused: No    Sexually Abused: No      Review of systems: All other review of systems negative except as mentioned in the HPI.   Physical Exam: Vitals:   09/12/22 1327  BP: 118/68  Pulse: 73  SpO2: 98%   Body mass index is 21.09 kg/m. Gen:      No acute distress HEENT:  sclera anicteric Abd:      soft, non-tender; no palpable masses, no distension Ext:    No edema Neuro: alert and oriented x 3 Psych: normal mood and affect  Data Reviewed:  Reviewed labs, radiology imaging, old records and pertinent past GI work up   Assessment and Plan/Recommendations:  72 year old very pleasant female with history of gastric ulcers in the setting of NSAIDs persistent intermittent epigastric pain  Will plan for repeat EGD to document healing ulcer, obtain additional biopsies to exclude dysplastic changes or malignancy  The risks and benefits as well as alternatives of endoscopic procedure(s) have been discussed and reviewed. All questions answered. The patient agrees to proceed.  Continue daily PPI Avoid NSAIDs   The patient was provided an opportunity to ask questions and all were answered. The patient agreed with the plan and demonstrated an understanding of the instructions.  Damaris Hippo , MD    CC: Michael Boston, MD

## 2022-09-14 ENCOUNTER — Encounter: Payer: Self-pay | Admitting: Gastroenterology

## 2022-09-14 ENCOUNTER — Ambulatory Visit (AMBULATORY_SURGERY_CENTER): Payer: PPO | Admitting: Gastroenterology

## 2022-09-14 VITALS — BP 111/67 | HR 69 | Temp 98.2°F | Resp 20 | Ht 59.0 in | Wt 104.0 lb

## 2022-09-14 DIAGNOSIS — Z8711 Personal history of peptic ulcer disease: Secondary | ICD-10-CM | POA: Diagnosis not present

## 2022-09-14 DIAGNOSIS — K257 Chronic gastric ulcer without hemorrhage or perforation: Secondary | ICD-10-CM | POA: Diagnosis not present

## 2022-09-14 DIAGNOSIS — K2950 Unspecified chronic gastritis without bleeding: Secondary | ICD-10-CM | POA: Diagnosis not present

## 2022-09-14 DIAGNOSIS — R1013 Epigastric pain: Secondary | ICD-10-CM | POA: Diagnosis not present

## 2022-09-14 MED ORDER — SODIUM CHLORIDE 0.9 % IV SOLN: 500.0000 mL | Freq: Once | INTRAVENOUS | Status: DC

## 2022-09-14 MED ORDER — PANTOPRAZOLE SODIUM 40 MG PO TBEC: 40.0000 mg | DELAYED_RELEASE_TABLET | Freq: Two times a day (BID) | ORAL | 3 refills | Status: DC

## 2022-09-14 MED ORDER — SUCRALFATE 1 G PO TABS: 1.0000 g | ORAL_TABLET | Freq: Four times a day (QID) | ORAL | 0 refills | Status: DC

## 2022-09-14 NOTE — Progress Notes (Signed)
Pt's states no medical or surgical changes since previsit or office visit. 

## 2022-09-14 NOTE — Progress Notes (Signed)
Called to room to assist during endoscopic procedure.  Patient ID and intended procedure confirmed with present staff. Received instructions for my participation in the procedure from the performing physician.  

## 2022-09-14 NOTE — Progress Notes (Signed)
Sedate, gd SR, tolerated procedure well, VSS, report to RN 

## 2022-09-14 NOTE — Patient Instructions (Addendum)
- Normal examined noted.                                     - Resume previous diet.                           - Continue present medications.                           - Await pathology results.                           - Follow an antireflux regimen.                           - Use Protonix (pantoprazole) 40 mg PO BID for 3                            months, followed by Pantoprazole 61m daily. Please                            send Rx for 90 days with 1 refill                           - Use sucralfate tablets 1 gram PO QID for 2 weeks  YOU HAD AN ENDOSCOPIC PROCEDURE TODAY AT [facility]   Refer to the procedure report that was given to you for any specific questions about what was found during the examination.  If the procedure report does not answer your questions, please call your gastroenterologist to clarify.  If you requested that your care partner not be given the details of your procedure findings, then the procedure report has been included in a sealed envelope for you to review at your convenience later.  YOU SHOULD EXPECT: Some feelings of bloating in the abdomen. Passage of more gas than usual.  Walking can help get rid of the air that was put into your GI tract during the procedure and reduce the bloating. If you had a lower endoscopy (such as a colonoscopy or flexible sigmoidoscopy) you may notice spotting of blood in your stool or on the toilet paper. If you underwent a bowel prep for your procedure, you may not have a normal bowel movement for a few days.  Please Note:  You might notice some irritation and congestion in your nose or some drainage.  This is from the oxygen used during your procedure.  There is no need for concern and it should clear up in a day or so.  SYMPTOMS TO REPORT IMMEDIATELY:   Following upper endoscopy (EGD)  Vomiting of blood or coffee ground material  New chest pain or pain under the shoulder blades  Painful or persistently  difficult swallowing  New shortness of breath  Fever of 100F or higher  Black, tarry-looking stools  For urgent or emergent issues, a gastroenterologist can be reached at any hour by calling ([phone] Do not use MyChart messaging for urgent concerns.    DIET:  We do recommend a small meal at first, but then you may proceed to your regular diet.  Drink plenty of fluids  but you should avoid alcoholic beverages for 24 hours.  ACTIVITY:  You should plan to take it easy for the rest of today and you should NOT DRIVE or use heavy machinery until tomorrow (because of the sedation medicines used during the test).    FOLLOW UP: Our staff will call the number listed on your records the next business day following your procedure.  We will call around 7:15- 8:00 am to check on you and address any questions or concerns that you may have regarding the information given to you following your procedure. If we do not reach you, we will leave a message.     If any biopsies were taken you will be contacted by phone or by letter within the next 1-3 weeks.  Please call us at [phone] if you have not heard about the biopsies in 3 weeks.    SIGNATURES/CONFIDENTIALITY: You and/or your care partner have signed paperwork which will be entered into your electronic medical record.  These signatures attest to the fact that that the information above on your After Visit Summary has been reviewed and is understood.  Full responsibility of the confidentiality of this discharge information lies with you and/or your care-partner.

## 2022-09-14 NOTE — Op Note (Signed)
[facility] Patient Name: Caitlin Irwin Procedure Date: 09/14/2022 10:33 AM MRN: [id number] Endoscopist: Mauri Pole , MD, [id number] Age: 72 Referring MD:  Date of Birth: 1950/12/09 Gender: Female Account #: [id number] Procedure:                Upper GI endoscopy Indications:              Upper abdominal symptoms that persist despite an                            appropriate trial of therapy, Upper abdominal                            symptoms associated with anorexia (suggesting                            structural disease), Follow-up of acute gastric                            ulcer Medicines:                Monitored Anesthesia Care Procedure:                Pre-Anesthesia Assessment:                           - Prior to the procedure, a History and Physical                            was performed, and patient medications and                            allergies were reviewed. The patient's tolerance of                            previous anesthesia was also reviewed. The risks                            and benefits of the procedure and the sedation                            options and risks were discussed with the patient.                            All questions were answered, and informed consent                            was obtained. Prior Anticoagulants: The patient has                            taken no anticoagulant or antiplatelet agents                            except for aspirin. ASA Grade Assessment: III - A  patient with severe systemic disease. After                            reviewing the risks and benefits, the patient was                            deemed in satisfactory condition to undergo the                            procedure.                           After obtaining informed consent, the endoscope was                            passed under direct vision. Throughout the                             procedure, the patient's blood pressure, pulse, and                            oxygen saturations were monitored continuously. The                            Olympus Scope [id number] was introduced through the                            mouth, and advanced to the second part of duodenum.                            The upper GI endoscopy was accomplished without                            difficulty. The patient tolerated the procedure                            well. Scope In: Scope Out: Findings:                 The Z-line was regular and was found 38 cm from the                            incisors.                           No gross lesions were noted in the entire esophagus.                           A medium-sized hiatal hernia was present.                           One non-bleeding cratered gastric ulcer with no                            stigmata of bleeding was found in the prepyloric  region of the stomach. The lesion was 10 mm in                            largest dimension. Biopsies were taken with a cold                            forceps for histology. Biopsies were taken with a                            cold forceps for Helicobacter pylori testing.                           The cardia and gastric fundus were normal on                            retroflexion.                           The examined duodenum was normal. Complications:            No immediate complications. Estimated Blood Loss:     Estimated blood loss was minimal. Impression:               - Z-line regular, 38 cm from the incisors.                           - No gross lesions in the entire esophagus.                           - Medium-sized hiatal hernia.                           - Non-bleeding gastric ulcer with no stigmata of                            bleeding. Biopsied.                           - Normal examined duodenum. Recommendation:           - Resume previous diet.                            - Continue present medications.                           - Await pathology results.                           - Follow an antireflux regimen.                           - Use Protonix (pantoprazole) 40 mg PO BID for 3                            months, followed by Pantoprazole 26m daily. Please  send Rx for 90 days with 1 refill                           - Use sucralfate tablets 1 gram PO QID for 2 weeks. Mauri Pole, MD 09/14/2022 10:54:14 AM This report has been signed electronically.

## 2022-09-14 NOTE — Progress Notes (Signed)
Please refer to office visit note 09/12/22. No additional changes in H&P Patient is appropriate for planned procedure(s) and anesthesia in an ambulatory setting  K. Denzil Magnuson , MD [phone]

## 2022-09-15 ENCOUNTER — Telehealth: Payer: Self-pay | Admitting: *Deleted

## 2022-09-15 NOTE — Telephone Encounter (Signed)
  Follow up Call-     09/14/2022   10:13 AM  Call back number  Post procedure Call Back phone  # [phone]  Permission to leave phone message Yes     Patient questions:  Message left to call us if necessary.

## 2022-09-20 ENCOUNTER — Encounter: Payer: Self-pay | Admitting: Gastroenterology

## 2022-09-22 ENCOUNTER — Other Ambulatory Visit (HOSPITAL_COMMUNITY): Payer: Self-pay | Admitting: *Deleted

## 2022-09-26 ENCOUNTER — Ambulatory Visit (HOSPITAL_COMMUNITY)
Admission: RE | Admit: 2022-09-26 | Discharge: 2022-09-26 | Disposition: A | Discharge status: Home / Self Care | Payer: PPO | Source: Ambulatory Visit | Attending: Internal Medicine | Admitting: Internal Medicine

## 2022-09-26 DIAGNOSIS — M810 Age-related osteoporosis without current pathological fracture: Secondary | ICD-10-CM | POA: Diagnosis not present

## 2022-09-26 MED ORDER — DENOSUMAB 60 MG/ML ~~LOC~~ SOSY
PREFILLED_SYRINGE | SUBCUTANEOUS | Status: AC
  Filled 2022-09-26: qty 1

## 2022-09-26 MED ORDER — DENOSUMAB 60 MG/ML ~~LOC~~ SOSY
60.0000 mg | PREFILLED_SYRINGE | Freq: Once | SUBCUTANEOUS | Status: AC
  Administered 2022-09-26: 60 mg via SUBCUTANEOUS

## 2022-09-29 ENCOUNTER — Ambulatory Visit: Payer: PPO | Admitting: Orthopedic Surgery

## 2022-10-04 ENCOUNTER — Other Ambulatory Visit: Payer: Self-pay | Admitting: Gastroenterology

## 2022-10-04 DIAGNOSIS — R1013 Epigastric pain: Secondary | ICD-10-CM

## 2022-10-04 DIAGNOSIS — Z8711 Personal history of peptic ulcer disease: Secondary | ICD-10-CM

## 2022-10-24 ENCOUNTER — Other Ambulatory Visit: Payer: Self-pay | Admitting: Gastroenterology

## 2022-10-24 DIAGNOSIS — R1013 Epigastric pain: Secondary | ICD-10-CM

## 2022-10-24 DIAGNOSIS — Z8711 Personal history of peptic ulcer disease: Secondary | ICD-10-CM

## 2022-10-26 ENCOUNTER — Ambulatory Visit: Payer: PPO | Admitting: Neurology

## 2022-11-14 ENCOUNTER — Ambulatory Visit: Payer: PPO | Admitting: Orthopedic Surgery

## 2022-11-21 ENCOUNTER — Other Ambulatory Visit (INDEPENDENT_AMBULATORY_CARE_PROVIDER_SITE_OTHER): Payer: PPO

## 2022-11-21 ENCOUNTER — Ambulatory Visit (INDEPENDENT_AMBULATORY_CARE_PROVIDER_SITE_OTHER): Payer: PPO | Admitting: Orthopedic Surgery

## 2022-11-21 DIAGNOSIS — S82142D Displaced bicondylar fracture of left tibia, subsequent encounter for closed fracture with routine healing: Secondary | ICD-10-CM

## 2022-11-21 NOTE — Progress Notes (Signed)
Orthopedic Surgery Progress Note     Assessment: Patient is a 72 y.o. female with left tibial plateau fracture status post ORIF (05/22/2022 ~ 6 months from surgery).  Doing well on her left side but is having pain on the right side with degenerative changes in the lateral compartment seen on x-ray     Plan: -Weight-bear as tolerated left lower extremity -Discussed her treatment options for the right knee and she wanted to proceed with Voltaren gel and intra-articular injection.  Injection done today in the office -Follow up in 26 weeks with AP/lateral left knee XR   Right knee injection note: After discussing the risk, benefits, alternatives of right knee intra-articular steroid injection, patient elected to proceed.  The patient was in the seated position.  The anterior lateral soft spot was prepped with alcohol based prep.  Ethyl chloride was used to anesthetize the area.  A 22-gauge needle was used to inject 1 cc of Depo-Medrol, 1 cc of lidocaine, 1 cc of Marcaine into the right knee intra-articular space under standard sterile technique.  The needle was withdrawn.  Band-Aid was applied.  Patient tolerated the procedure well. ___________________________________________________________________________   Subjective: Not having pain in the left knee.  Has been walking around without assistive devices.  No complaints with her left knee.  She has noticed pain on her right knee.  Feels it is worse with ambulation.  She also notices a clicking sensation in the knee.  She has noticed that gotten worse within the last month.  There is no trauma or injury in particular that preceded the worsening of her pain.  Denies paresthesias and numbness in the bilateral lower extremities.     Physical Exam:   General: no acute distress, appears stated age Neurologic: alert, answering questions appropriately, following commands Respiratory: unlabored breathing on room air, symmetric chest rise Psychiatric:  appropriate affect, normal cadence to speech   MSK:    -Left lower extremity  Incisions well healed             Knee ROM from 0-100 actively Fires hip flexors, quadriceps, hamstrings, tibialis anterior, gastrocnemius and soleus, extensor hallucis longus Plantarflexes and dorsiflexes toes Sensation intact to light touch in sural, saphenous, tibial, deep peroneal, and superficial peroneal nerve distributions Foot warm and well perfused  -Right lower extremity  Incisions appear well healed  Knee ROM from 0-100 actively EHL/TA/GSC intact Pain with mcmurray but no palpable click, knee stable to varus and valgus stress, negative lachman, negative posterior drawer Plantarflexes and dorsiflexes toes Sensation intact to light touch in sural, saphenous, tibial, deep peroneal, and superficial peroneal nerve distributions Foot warm and well perfused   XRs of the left knee from 11/21/2022 were independently reviewed today showing alignment maintained.  No lucency seen around the screws. Fracture lines are not distinct. Shortening is noted on the contralateral side with lateral joint space narrowing and subchondral sclerosis.     Patient name: Caitlin Irwin Patient MRN: [id number] Date: 11/21/22

## 2022-11-27 ENCOUNTER — Other Ambulatory Visit: Payer: Self-pay | Admitting: Gastroenterology

## 2022-11-27 DIAGNOSIS — Z8711 Personal history of peptic ulcer disease: Secondary | ICD-10-CM

## 2022-11-27 DIAGNOSIS — R1013 Epigastric pain: Secondary | ICD-10-CM

## 2023-01-10 DIAGNOSIS — R519 Headache, unspecified: Secondary | ICD-10-CM | POA: Diagnosis not present

## 2023-02-09 DIAGNOSIS — G319 Degenerative disease of nervous system, unspecified: Secondary | ICD-10-CM | POA: Diagnosis not present

## 2023-02-09 DIAGNOSIS — I700 Atherosclerosis of aorta: Secondary | ICD-10-CM | POA: Diagnosis not present

## 2023-02-09 DIAGNOSIS — E039 Hypothyroidism, unspecified: Secondary | ICD-10-CM | POA: Diagnosis not present

## 2023-02-09 DIAGNOSIS — I10 Essential (primary) hypertension: Secondary | ICD-10-CM | POA: Diagnosis not present

## 2023-02-09 DIAGNOSIS — M810 Age-related osteoporosis without current pathological fracture: Secondary | ICD-10-CM | POA: Diagnosis not present

## 2023-02-09 DIAGNOSIS — G500 Trigeminal neuralgia: Secondary | ICD-10-CM | POA: Diagnosis not present

## 2023-02-09 DIAGNOSIS — Z8711 Personal history of peptic ulcer disease: Secondary | ICD-10-CM | POA: Diagnosis not present

## 2023-02-14 DIAGNOSIS — Z923 Personal history of irradiation: Secondary | ICD-10-CM | POA: Diagnosis not present

## 2023-02-14 DIAGNOSIS — R519 Headache, unspecified: Secondary | ICD-10-CM | POA: Diagnosis not present

## 2023-02-14 DIAGNOSIS — Z9889 Other specified postprocedural states: Secondary | ICD-10-CM | POA: Diagnosis not present

## 2023-02-14 DIAGNOSIS — G500 Trigeminal neuralgia: Secondary | ICD-10-CM | POA: Diagnosis not present

## 2023-02-17 ENCOUNTER — Telehealth: Payer: Self-pay | Admitting: Gastroenterology

## 2023-02-17 NOTE — Telephone Encounter (Signed)
Inbound call from patient's caregiver requesting a call back regarding pantoprazole medication. Requesting to know if patient should still be taking medication. Requesting a call back at [phone]. Please advise, thank you.

## 2023-02-17 NOTE — Telephone Encounter (Signed)
I returned patients call and explained to her that in her last EGD report Dr Lavon Paganini wanted pt to take Pantoprazole twice a day for 3 months, she has been taking the medication for 3 months so I told her to drop the pantoprazole down to once daily if tolerable of course if she has symptoms again to go back to twice a day. She is going to go down to once daily then I told her to continue once daily until she see's Dr Lavon Paganini again in the office to discuss. Patient was in agreement with this plan    FYI Dr Lavon Paganini

## 2023-02-22 NOTE — Telephone Encounter (Signed)
Agree, thank you

## 2023-03-03 ENCOUNTER — Other Ambulatory Visit: Payer: Self-pay | Admitting: Internal Medicine

## 2023-03-03 DIAGNOSIS — Z1231 Encounter for screening mammogram for malignant neoplasm of breast: Secondary | ICD-10-CM

## 2023-03-13 DIAGNOSIS — R519 Headache, unspecified: Secondary | ICD-10-CM | POA: Diagnosis not present

## 2023-03-13 DIAGNOSIS — G500 Trigeminal neuralgia: Secondary | ICD-10-CM | POA: Diagnosis not present

## 2023-03-15 ENCOUNTER — Ambulatory Visit
Admission: RE | Admit: 2023-03-15 | Discharge: 2023-03-15 | Disposition: A | Discharge status: Home / Self Care | Payer: PPO | Source: Ambulatory Visit | Attending: Internal Medicine | Admitting: Internal Medicine

## 2023-03-15 DIAGNOSIS — Z1231 Encounter for screening mammogram for malignant neoplasm of breast: Secondary | ICD-10-CM | POA: Diagnosis not present

## 2023-07-11 ENCOUNTER — Inpatient Hospital Stay
Admission: RE | Admit: 2023-07-11 | Discharge: 2023-07-11 | Disposition: A | Discharge status: Home / Self Care | Payer: PPO | Source: Ambulatory Visit | Attending: Internal Medicine

## 2023-07-11 DIAGNOSIS — M8588 Other specified disorders of bone density and structure, other site: Secondary | ICD-10-CM | POA: Diagnosis not present

## 2023-07-11 DIAGNOSIS — E2839 Other primary ovarian failure: Secondary | ICD-10-CM | POA: Diagnosis not present

## 2023-07-11 DIAGNOSIS — M810 Age-related osteoporosis without current pathological fracture: Secondary | ICD-10-CM

## 2023-07-11 DIAGNOSIS — N958 Other specified menopausal and perimenopausal disorders: Secondary | ICD-10-CM | POA: Diagnosis not present

## 2023-07-11 DIAGNOSIS — Z90722 Acquired absence of ovaries, bilateral: Secondary | ICD-10-CM | POA: Diagnosis not present

## 2023-07-19 ENCOUNTER — Other Ambulatory Visit: Payer: Self-pay | Admitting: Gastroenterology

## 2023-07-19 DIAGNOSIS — R1013 Epigastric pain: Secondary | ICD-10-CM

## 2023-07-19 DIAGNOSIS — Z8711 Personal history of peptic ulcer disease: Secondary | ICD-10-CM

## 2024-01-14 ENCOUNTER — Other Ambulatory Visit: Payer: Self-pay | Admitting: Gastroenterology

## 2024-01-14 DIAGNOSIS — R1013 Epigastric pain: Secondary | ICD-10-CM

## 2024-01-14 DIAGNOSIS — Z8711 Personal history of peptic ulcer disease: Secondary | ICD-10-CM

## 2024-04-24 NOTE — Progress Notes (Signed)
 Caitlin Irwin                                          MRN: [id number]   04/24/2024   The [facility] Quality Team Specialist reviewed this patient medical record for the purposes of chart review for care gap closure. The following were reviewed: chart review for care gap closure-controlling blood pressure.    [facility] Quality Team

## 2024-05-21 DIAGNOSIS — Z8673 Personal history of transient ischemic attack (TIA), and cerebral infarction without residual deficits: Secondary | ICD-10-CM | POA: Diagnosis not present

## 2024-05-21 DIAGNOSIS — G319 Degenerative disease of nervous system, unspecified: Secondary | ICD-10-CM | POA: Diagnosis not present

## 2024-05-21 DIAGNOSIS — R413 Other amnesia: Secondary | ICD-10-CM | POA: Diagnosis not present

## 2024-05-21 DIAGNOSIS — I10 Essential (primary) hypertension: Secondary | ICD-10-CM | POA: Diagnosis not present

## 2024-05-21 DIAGNOSIS — G500 Trigeminal neuralgia: Secondary | ICD-10-CM | POA: Diagnosis not present

## 2024-05-21 DIAGNOSIS — E785 Hyperlipidemia, unspecified: Secondary | ICD-10-CM | POA: Diagnosis not present

## 2024-05-21 DIAGNOSIS — Z23 Encounter for immunization: Secondary | ICD-10-CM | POA: Diagnosis not present

## 2024-05-21 DIAGNOSIS — E039 Hypothyroidism, unspecified: Secondary | ICD-10-CM | POA: Diagnosis not present

## 2024-05-21 DIAGNOSIS — Z8711 Personal history of peptic ulcer disease: Secondary | ICD-10-CM | POA: Diagnosis not present

## 2024-05-21 DIAGNOSIS — M810 Age-related osteoporosis without current pathological fracture: Secondary | ICD-10-CM | POA: Diagnosis not present

## 2024-05-21 DIAGNOSIS — F419 Anxiety disorder, unspecified: Secondary | ICD-10-CM | POA: Diagnosis not present

## 2024-05-21 DIAGNOSIS — F339 Major depressive disorder, recurrent, unspecified: Secondary | ICD-10-CM | POA: Diagnosis not present

## 2024-05-24 ENCOUNTER — Telehealth (HOSPITAL_COMMUNITY): Payer: Self-pay | Admitting: Pharmacy Technician

## 2024-05-24 ENCOUNTER — Other Ambulatory Visit (HOSPITAL_COMMUNITY): Payer: Self-pay | Admitting: Internal Medicine

## 2024-05-24 DIAGNOSIS — M810 Age-related osteoporosis without current pathological fracture: Secondary | ICD-10-CM | POA: Insufficient documentation

## 2024-05-24 NOTE — Telephone Encounter (Signed)
 Auth Submission: NO AUTH NEEDED Site of care: [facility] Payer: [employer] Medication & CPT/J Code(s) submitted: Reclast (Zolendronic acid) J3489 Diagnosis Code: M81.0 Route of submission (phone, fax, portal):  Phone # Fax # Auth type: Buy/Bill HB Units/visits requested: 5mg  x 1 dose, q 12 months Reference number:  Approval from: 05/24/24 to 07/31/24    [name], CPhT [facility] Phone: [phone] 05/24/2024

## 2024-06-24 ENCOUNTER — Encounter (HOSPITAL_COMMUNITY): Payer: Self-pay | Admitting: Internal Medicine

## 2024-08-05 ENCOUNTER — Inpatient Hospital Stay: Admitting: Physician Assistant

## 2024-08-05 ENCOUNTER — Inpatient Hospital Stay

## 2024-10-21 ENCOUNTER — Inpatient Hospital Stay

## 2024-10-21 ENCOUNTER — Inpatient Hospital Stay: Admitting: Physician Assistant
